# Patient Record
Sex: Female | Born: 1950 | Race: White | Hispanic: No | Marital: Married | Smoking: Never smoker
Health system: Southern US, Community
[De-identification: ages and names within clinical notes are randomized; demographics above are authoritative.]

## PROBLEM LIST (undated history)

## (undated) DIAGNOSIS — H409 Unspecified glaucoma: Secondary | ICD-10-CM

## (undated) DIAGNOSIS — K529 Noninfective gastroenteritis and colitis, unspecified: Secondary | ICD-10-CM

## (undated) DIAGNOSIS — M5416 Radiculopathy, lumbar region: Secondary | ICD-10-CM

## (undated) DIAGNOSIS — Z96659 Presence of unspecified artificial knee joint: Secondary | ICD-10-CM

## (undated) DIAGNOSIS — R112 Nausea with vomiting, unspecified: Secondary | ICD-10-CM

## (undated) DIAGNOSIS — G8929 Other chronic pain: Secondary | ICD-10-CM

## (undated) DIAGNOSIS — N83202 Unspecified ovarian cyst, left side: Secondary | ICD-10-CM

## (undated) DIAGNOSIS — Z8719 Personal history of other diseases of the digestive system: Secondary | ICD-10-CM

## (undated) DIAGNOSIS — T7840XA Allergy, unspecified, initial encounter: Secondary | ICD-10-CM

## (undated) DIAGNOSIS — M1990 Unspecified osteoarthritis, unspecified site: Secondary | ICD-10-CM

## (undated) DIAGNOSIS — M25551 Pain in right hip: Secondary | ICD-10-CM

## (undated) DIAGNOSIS — M4306 Spondylolysis, lumbar region: Secondary | ICD-10-CM

## (undated) DIAGNOSIS — R739 Hyperglycemia, unspecified: Secondary | ICD-10-CM

## (undated) DIAGNOSIS — E785 Hyperlipidemia, unspecified: Secondary | ICD-10-CM

## (undated) DIAGNOSIS — I2510 Atherosclerotic heart disease of native coronary artery without angina pectoris: Secondary | ICD-10-CM

## (undated) DIAGNOSIS — M810 Age-related osteoporosis without current pathological fracture: Secondary | ICD-10-CM

## (undated) DIAGNOSIS — M25512 Pain in left shoulder: Secondary | ICD-10-CM

## (undated) DIAGNOSIS — M545 Low back pain, unspecified: Secondary | ICD-10-CM

## (undated) DIAGNOSIS — Z8489 Family history of other specified conditions: Secondary | ICD-10-CM

## (undated) DIAGNOSIS — H40009 Preglaucoma, unspecified, unspecified eye: Secondary | ICD-10-CM

## (undated) DIAGNOSIS — M51369 Other intervertebral disc degeneration, lumbar region without mention of lumbar back pain or lower extremity pain: Secondary | ICD-10-CM

## (undated) DIAGNOSIS — I10 Essential (primary) hypertension: Secondary | ICD-10-CM

## (undated) DIAGNOSIS — M5136 Other intervertebral disc degeneration, lumbar region: Secondary | ICD-10-CM

## (undated) DIAGNOSIS — E669 Obesity, unspecified: Secondary | ICD-10-CM

## (undated) DIAGNOSIS — Z9889 Other specified postprocedural states: Secondary | ICD-10-CM

## (undated) HISTORY — DX: Pain in right hip: M25.551

## (undated) HISTORY — PX: NASAL SINUS SURGERY: SHX719

## (undated) HISTORY — DX: Other chronic pain: G89.29

## (undated) HISTORY — DX: Low back pain, unspecified: M54.50

## (undated) HISTORY — PX: OTHER SURGICAL HISTORY: SHX169

## (undated) HISTORY — DX: Allergy, unspecified, initial encounter: T78.40XA

## (undated) HISTORY — DX: Obesity, unspecified: E66.9

## (undated) HISTORY — DX: Hyperglycemia, unspecified: R73.9

## (undated) HISTORY — PX: CARPAL TUNNEL RELEASE: SHX101

## (undated) HISTORY — DX: Spondylolysis, lumbar region: M43.06

## (undated) HISTORY — DX: Atherosclerotic heart disease of native coronary artery without angina pectoris: I25.10

## (undated) HISTORY — DX: Age-related osteoporosis without current pathological fracture: M81.0

## (undated) HISTORY — DX: Other intervertebral disc degeneration, lumbar region without mention of lumbar back pain or lower extremity pain: M51.369

## (undated) HISTORY — DX: Hyperlipidemia, unspecified: E78.5

## (undated) HISTORY — DX: Presence of unspecified artificial knee joint: Z96.659

## (undated) HISTORY — DX: Radiculopathy, lumbar region: M54.16

## (undated) HISTORY — DX: Noninfective gastroenteritis and colitis, unspecified: K52.9

## (undated) HISTORY — DX: Pain in left shoulder: M25.512

## (undated) HISTORY — DX: Other intervertebral disc degeneration, lumbar region: M51.36

---

## 2000-04-09 ENCOUNTER — Encounter: Payer: Self-pay | Admitting: Family Medicine

## 2000-04-09 ENCOUNTER — Encounter: Admission: RE | Admit: 2000-04-09 | Discharge: 2000-04-09 | Payer: Self-pay | Admitting: Family Medicine

## 2001-04-21 ENCOUNTER — Encounter: Admission: RE | Admit: 2001-04-21 | Discharge: 2001-04-21 | Payer: Self-pay | Admitting: Family Medicine

## 2001-04-21 ENCOUNTER — Encounter: Payer: Self-pay | Admitting: Family Medicine

## 2002-05-04 ENCOUNTER — Encounter: Admission: RE | Admit: 2002-05-04 | Discharge: 2002-05-04 | Payer: Self-pay | Admitting: Family Medicine

## 2002-05-04 ENCOUNTER — Encounter: Payer: Self-pay | Admitting: Family Medicine

## 2003-05-22 ENCOUNTER — Encounter: Admission: RE | Admit: 2003-05-22 | Discharge: 2003-05-22 | Payer: Self-pay | Admitting: Family Medicine

## 2003-05-22 ENCOUNTER — Encounter: Payer: Self-pay | Admitting: Family Medicine

## 2004-05-27 ENCOUNTER — Encounter: Admission: RE | Admit: 2004-05-27 | Discharge: 2004-05-27 | Payer: Self-pay | Admitting: Family Medicine

## 2004-06-18 ENCOUNTER — Encounter: Admission: RE | Admit: 2004-06-18 | Discharge: 2004-06-18 | Payer: Self-pay | Admitting: Family Medicine

## 2005-06-19 ENCOUNTER — Encounter: Admission: RE | Admit: 2005-06-19 | Discharge: 2005-06-19 | Payer: Self-pay | Admitting: Family Medicine

## 2006-08-05 ENCOUNTER — Encounter: Admission: RE | Admit: 2006-08-05 | Discharge: 2006-08-05 | Payer: Self-pay | Admitting: Family Medicine

## 2007-08-08 ENCOUNTER — Encounter: Admission: RE | Admit: 2007-08-08 | Discharge: 2007-08-08 | Payer: Self-pay | Admitting: Family Medicine

## 2007-11-18 ENCOUNTER — Encounter: Admission: RE | Admit: 2007-11-18 | Discharge: 2007-11-18 | Payer: Self-pay | Admitting: Family Medicine

## 2008-08-09 ENCOUNTER — Encounter: Admission: RE | Admit: 2008-08-09 | Discharge: 2008-08-09 | Payer: Self-pay | Admitting: Family Medicine

## 2010-11-15 ENCOUNTER — Encounter: Payer: Self-pay | Admitting: Family Medicine

## 2011-05-30 ENCOUNTER — Encounter: Payer: Self-pay | Admitting: Internal Medicine

## 2011-08-27 ENCOUNTER — Encounter: Payer: Self-pay | Admitting: Internal Medicine

## 2011-08-28 ENCOUNTER — Other Ambulatory Visit: Payer: Self-pay | Admitting: Internal Medicine

## 2011-09-01 ENCOUNTER — Other Ambulatory Visit: Payer: Self-pay | Admitting: Internal Medicine

## 2011-10-02 ENCOUNTER — Ambulatory Visit (AMBULATORY_SURGERY_CENTER): Payer: BC Managed Care – PPO

## 2011-10-02 ENCOUNTER — Encounter: Payer: Self-pay | Admitting: Internal Medicine

## 2011-10-02 VITALS — Ht 66.0 in | Wt 188.9 lb

## 2011-10-02 DIAGNOSIS — Z1211 Encounter for screening for malignant neoplasm of colon: Secondary | ICD-10-CM

## 2011-10-02 MED ORDER — PEG-KCL-NACL-NASULF-NA ASC-C 100 G PO SOLR: 1.0000 | Freq: Once | ORAL | Status: AC

## 2011-10-16 ENCOUNTER — Encounter: Payer: Self-pay | Admitting: Internal Medicine

## 2011-10-16 ENCOUNTER — Ambulatory Visit (AMBULATORY_SURGERY_CENTER): Payer: BC Managed Care – PPO | Admitting: Internal Medicine

## 2011-10-16 VITALS — BP 129/61 | HR 85 | Temp 97.9°F | Resp 22 | Ht 66.0 in | Wt 188.0 lb

## 2011-10-16 DIAGNOSIS — D126 Benign neoplasm of colon, unspecified: Secondary | ICD-10-CM

## 2011-10-16 DIAGNOSIS — Z1211 Encounter for screening for malignant neoplasm of colon: Secondary | ICD-10-CM

## 2011-10-16 MED ORDER — SODIUM CHLORIDE 0.9 % IV SOLN: 500.0000 mL | INTRAVENOUS | Status: DC

## 2011-10-16 NOTE — Progress Notes (Signed)
Patient did not experience any of the following events: a burn prior to discharge; a fall within the facility; wrong site/side/patient/procedure/implant event; or a hospital transfer or hospital admission upon discharge from the facility. (G8907) Patient did not have preoperative order for IV antibiotic SSI prophylaxis. (G8918)  

## 2011-10-16 NOTE — Op Note (Signed)
[facility] [address] [place], [place]  [address]  COLONOSCOPY PROCEDURE REPORT  PATIENT:  Carol Cox, Carol Cox  MR#:  [id number] BIRTHDATE:  [birth year], 60 yrs. old  GENDER:  female ENDOSCOPIST:  Wilhemina Bonito. Eda Keys, MD REF. BY:  Screening/ Recall PROCEDURE DATE:  10/16/2011 PROCEDURE:  Colonoscopy with snare polypectomy x 4 ASA CLASS:  Class I INDICATIONS:  Routine Risk Screening ; index exam 04-2004 negative  MEDICATIONS:   Fentanyl 125 mcg IV, Versed 15 mg IV, Benadryl 25 mg IV, These medications were titrated to patient response per physician's verbal order  DESCRIPTION OF PROCEDURE:   After the risks benefits and alternatives of the procedure were thoroughly explained, informed consent was obtained.  Digital rectal exam was performed and revealed no abnormalities.   The LB 180AL [id number] endoscope was introduced through the anus and advanced to the cecum, which was identified by both the appendix and ileocecal valve, without limitations.  The quality of the prep was good, using MoviPrep. The instrument was then slowly withdrawn as the colon was fully examined. <<PROCEDUREIMAGES>>  FINDINGS: Four polyps, all < 5mm, were found in the ascending colon andere snared without cautery. Retrieval was successful. Otherwise normal colonoscopy without other polyps, masses, vascular ectasias, or inflammatory changes.   Retroflexed views in the rectum revealed no abnormalities.The time to cecum = 8:28 minutes. The scope was then withdrawn in 21:17 minutes from the cecum and the procedure completed.  COMPLICATIONS:  None  ENDOSCOPIC IMPRESSION: 1) Four polyps in the ascending colon - removed 2) Otherwise normal colonoscopy  RECOMMENDATIONS: 1) Repeat colonoscopy in 5 years if polyp adenomatous; otherwise 10 years  ______________________________ Wilhemina Bonito. Eda Keys, MD  CC:  Benedetto Goad, MD;  The Patient  n. eSIGNED:   Wilhemina Bonito. Eda Keys at 10/16/2011 09:38 AM  [place], [phone]

## 2011-10-16 NOTE — Progress Notes (Signed)
Pressure applied to abdomen to reach cecum.  

## 2011-10-16 NOTE — Patient Instructions (Signed)
Discharge instructions per blue and green sheets  Handout given on polyps  We will mail you a letter in 1-2 weeks with the pathology results and dr perry's recommendations

## 2011-10-19 ENCOUNTER — Telehealth: Payer: Self-pay | Admitting: *Deleted

## 2011-10-19 NOTE — Telephone Encounter (Signed)

## 2013-02-03 ENCOUNTER — Emergency Department (HOSPITAL_COMMUNITY): Payer: BC Managed Care – PPO

## 2013-02-03 ENCOUNTER — Other Ambulatory Visit: Payer: Self-pay

## 2013-02-03 ENCOUNTER — Encounter (HOSPITAL_COMMUNITY): Payer: Self-pay | Admitting: *Deleted

## 2013-02-03 ENCOUNTER — Inpatient Hospital Stay (HOSPITAL_COMMUNITY)
Admission: EM | Admit: 2013-02-03 | Discharge: 2013-02-06 | DRG: 183 | Disposition: A | Discharge status: Home / Self Care | Payer: BC Managed Care – PPO | Attending: Cardiology | Admitting: Cardiology

## 2013-02-03 DIAGNOSIS — I2510 Atherosclerotic heart disease of native coronary artery without angina pectoris: Secondary | ICD-10-CM | POA: Diagnosis present

## 2013-02-03 DIAGNOSIS — R0789 Other chest pain: Secondary | ICD-10-CM | POA: Diagnosis present

## 2013-02-03 DIAGNOSIS — Z79899 Other long term (current) drug therapy: Secondary | ICD-10-CM

## 2013-02-03 DIAGNOSIS — E785 Hyperlipidemia, unspecified: Secondary | ICD-10-CM | POA: Diagnosis present

## 2013-02-03 DIAGNOSIS — R079 Chest pain, unspecified: Secondary | ICD-10-CM

## 2013-02-03 DIAGNOSIS — Z8249 Family history of ischemic heart disease and other diseases of the circulatory system: Secondary | ICD-10-CM

## 2013-02-03 DIAGNOSIS — K219 Gastro-esophageal reflux disease without esophagitis: Principal | ICD-10-CM | POA: Diagnosis present

## 2013-02-03 DIAGNOSIS — Z7982 Long term (current) use of aspirin: Secondary | ICD-10-CM

## 2013-02-03 DIAGNOSIS — Z6830 Body mass index (BMI) 30.0-30.9, adult: Secondary | ICD-10-CM

## 2013-02-03 DIAGNOSIS — I200 Unstable angina: Secondary | ICD-10-CM

## 2013-02-03 DIAGNOSIS — E669 Obesity, unspecified: Secondary | ICD-10-CM | POA: Diagnosis present

## 2013-02-03 HISTORY — DX: Atherosclerotic heart disease of native coronary artery without angina pectoris: I25.10

## 2013-02-03 HISTORY — DX: Unspecified osteoarthritis, unspecified site: M19.90

## 2013-02-03 LAB — COMPREHENSIVE METABOLIC PANEL
ALT: 23 U/L (ref 0–35)
Albumin: 3.4 g/dL — ABNORMAL LOW (ref 3.5–5.2)
Alkaline Phosphatase: 58 U/L (ref 39–117)
BUN: 25 mg/dL — ABNORMAL HIGH (ref 6–23)
Calcium: 8.7 mg/dL (ref 8.4–10.5)
GFR calc Af Amer: 80 mL/min — ABNORMAL LOW (ref >90)
Potassium: 3.8 mEq/L (ref 3.5–5.1)
Total Protein: 6.2 g/dL (ref 6.0–8.3)

## 2013-02-03 LAB — CBC WITH DIFFERENTIAL/PLATELET
Basophils Relative: 0 % (ref 0–1)
Eosinophils Absolute: 0.1 10*3/uL (ref 0.0–0.7)
Eosinophils Relative: 1 % (ref 0–5)
Lymphs Abs: 1 10*3/uL (ref 0.7–4.0)
Monocytes Absolute: 0.6 10*3/uL (ref 0.1–1.0)
Monocytes Relative: 7 % (ref 3–12)
Neutro Abs: 7.2 10*3/uL (ref 1.7–7.7)
Neutrophils Relative %: 81 % — ABNORMAL HIGH (ref 43–77)
Platelets: 196 10*3/uL (ref 150–400)
RBC: 4.44 MIL/uL (ref 3.87–5.11)
WBC: 8.9 10*3/uL (ref 4.0–10.5)

## 2013-02-03 LAB — PROTIME-INR: INR: 0.95 (ref 0.00–1.49)

## 2013-02-03 MED ORDER — HEPARIN BOLUS VIA INFUSION
4000.0000 [IU] | Freq: Once | INTRAVENOUS | Status: AC
  Administered 2013-02-03: 4000 [IU] via INTRAVENOUS

## 2013-02-03 MED ORDER — SODIUM CHLORIDE 0.9 % IV BOLUS (SEPSIS)
1000.0000 mL | Freq: Once | INTRAVENOUS | Status: AC
  Administered 2013-02-03: 1000 mL via INTRAVENOUS

## 2013-02-03 MED ORDER — HEPARIN (PORCINE) IN NACL 100-0.45 UNIT/ML-% IJ SOLN
1200.0000 [IU]/h | INTRAMUSCULAR | Status: DC
  Administered 2013-02-03: 1300 [IU]/h via INTRAVENOUS
  Filled 2013-02-03 (×4): qty 250

## 2013-02-03 NOTE — H&P (Signed)
CARDIOLOGY ADMISSION NOTE  Patient ID: Carol Cox MRN: [id number] DOB/AGE: 01-02-1951 62 y.o.  Admit date: 02/03/2013 Primary Physician   Pamelia Hoit, MD Primary Cardiologist   None Chief Complaint    Chest pain  HPI:  The patient presents for evaluation of chest pain.  She has no history of CAD.  However, she has had long standing dyslipidemia and a very dramatic family history of CAD.  She was in her usual state of health.  She does have some symptoms of reflux treated with Zantac.  This am she had some discomfort that did not go away with Zantac.  At noon she had a severe episode while doing some very light housekeeping.  This was somewhat different.  There was pressure.  She became very diaphoretic and weak.  She was very nauseated.  She had dyspnea.  Her discomfort was 4/10.  This lasted for several minutes until her husband came home.  He called EMS.  EKG was nonacute.  She did get ASA. Symptoms improved after SL NTG.  Initial troponin in the ER was negative.  She otherwise has had no recent symptoms.      Past Medical History  Diagnosis Date  . Hyperlipidemia   . Allergy     Past Surgical History  Procedure Laterality Date  . Carpal tunnel release      Bilateral    No Known Allergies No current facility-administered medications on file prior to encounter.   Current Outpatient Prescriptions on File Prior to Encounter  Medication Sig Dispense Refill  . aspirin 81 MG tablet Take 81 mg by mouth daily.        . Calcium Carbonate-Vitamin D (CALTRATE 600+D) 600-400 MG-UNIT per tablet Take 1 tablet by mouth daily.        . cetirizine (ZYRTEC) 10 MG tablet Take 10 mg by mouth as needed for allergies.       Marland Kitchen ezetimibe-simvastatin (VYTORIN) 10-40 MG per tablet Take 1 tablet by mouth 4 (four) times a week.       . fish oil-omega-3 fatty acids 1000 MG capsule Take 1 g by mouth daily.        Marland Kitchen METROGEL 1 % gel Apply 1 application topically daily.       . Multiple Vitamin  (MULTIVITAMIN) tablet Take 1 tablet by mouth daily.         History   Social History  . Marital Status: Married    Spouse Name: N/A    Number of Children: 3  . Years of Education: N/A   Occupational History  . Retired     Runner, broadcasting/film/video   Social History Main Topics  . Smoking status: Never Smoker   . Smokeless tobacco: Never Used  . Alcohol Use: No  . Drug Use: No  . Sexually Active: Not on file   Other Topics Concern  . Not on file   Social History Narrative   Lives at home with husband. 4 grandchildren.      Family History  Problem Relation Age of Onset  . Sudden death Father 59    Presumed heart attack  . CAD Sister 6    Died age 19 of MI  . CAD Maternal Uncle 90    Fatal MI  . CAD Maternal Uncle 50    Fatal MI    ROS:  As stated in the HPI and negative for all other systems.  Physical Exam: Blood pressure 123/64, pulse 86, temperature 98.1 F (36.7 C), temperature  source Oral, resp. rate 19, height 5\' 6"  (1.676 m), weight 190 lb (86.183 kg), SpO2 97.00%.  GENERAL:  Well appearing HEENT:  Pupils equal round and reactive, fundi not visualized, oral mucosa unremarkable NECK:  No jugular venous distention, waveform within normal limits, carotid upstroke brisk and symmetric, no bruits, no thyromegaly LYMPHATICS:  No cervical, inguinal adenopathy LUNGS:  Clear to auscultation bilaterally BACK:  No CVA tenderness CHEST:  Unremarkable HEART:  PMI not displaced or sustained,S1 and S2 within normal limits, no S3, no S4, no clicks, no rubs, no murmurs ABD:  Flat, positive bowel sounds normal in frequency in pitch, no bruits, no rebound, no guarding, no midline pulsatile mass, no hepatomegaly, no splenomegaly EXT:  2 plus pulses throughout, no edema, no cyanosis no clubbing SKIN:  No rashes no nodules NEURO:  Cranial nerves II through XII grossly intact, motor grossly intact throughout PSYCH:  Cognitively intact, oriented to person place and time  Labs: Lab Results    Component Value Date   BUN 25* 02/03/2013   Lab Results  Component Value Date   CREATININE 0.89 02/03/2013   Lab Results  Component Value Date   NA 141 02/03/2013   K 3.8 02/03/2013   CL 109 02/03/2013   CO2 22 02/03/2013   No results found for this basename: CKTOTAL,  CKMB,  CKMBINDEX,  TROPONINI   Lab Results  Component Value Date   WBC 8.9 02/03/2013   HGB 13.6 02/03/2013   HCT 40.1 02/03/2013   MCV 90.3 02/03/2013   PLT 196 02/03/2013   No results found for this basename: CHOL,  HDL,  LDLCALC,  LDLDIRECT,  TRIG,  CHOLHDL   Lab Results  Component Value Date   ALT 23 02/03/2013   AST 24 02/03/2013   ALKPHOS 58 02/03/2013   BILITOT 0.4 02/03/2013     Radiology:  CXR:  Low lung volumes with mild bilateral infrahilar air space disease.  EKG:  Sinus rhythm, rate 86, axis within normal limits, intervals within normal limits, no acute ST-T wave changes.  Low voltage chest an limb leads.  Poor anterior R wave progression.   ASSESSMENT AND PLAN:    CHEST PAIN:  She has symptoms consistent with obstructive CAD and a very remarkable family history with I suspect familial dyslipidemia.  The pretest probability of obstructive CAD is high.  She will need a cardiac cath.  The patient understands that risks included but are not limited to stroke (1 in 1000), death (1 in 1000), kidney failure [usually temporary] (1 in 500), bleeding (1 in 200), allergic reaction [possibly serious] (1 in 200).  The patient understands and agrees to proceed.   HYPERLIPIDEMIA:   She has been intolerant of multiple medications.  She is tolerating the current medication four times per week.  I will check a lipid profile but our options for treatment are limited.    SignedRollene Rotunda 02/03/2013, 6:08 PM

## 2013-02-03 NOTE — ED Notes (Signed)
Attempted to call report. RN to call back. 

## 2013-02-03 NOTE — Progress Notes (Signed)
ANTICOAGULATION CONSULT NOTE - Initial Consult  Pharmacy Consult for UFH Indication: USAP  No Known Allergies  Patient Measurements: Height: 5\' 6"  (167.6 cm) Weight: 190 lb (86.183 kg) IBW/kg (Calculated) : 59.3 Heparin Dosing Weight: 78kg  Vital Signs: Temp: 98.1 F (36.7 C) (04/11 1423) Temp src: Oral (04/11 1423) BP: 123/64 mmHg (04/11 1730) Pulse Rate: 86 (04/11 1730)  Labs:  Recent Labs  02/03/13 1500  HGB 13.6  HCT 40.1  PLT 196  LABPROT 12.6  INR 0.95  CREATININE 0.89    Estimated Creatinine Clearance: 74.4 ml/min (by C-G formula based on Cr of 0.89).   Medical History: Past Medical History  Diagnosis Date  . Hyperlipidemia   . Allergy     Medications:   (Not in a hospital admission)  Assessment: 62 y/o female patient admitted with chest pain requiring anticoagulation for r/o MI. EKG wnl and CE neg x1. Plan for cardiac cath.  Goal of Therapy:  Heparin level 0.3-0.7 units/ml Monitor platelets by anticoagulation protocol: Yes   Plan:  Heparin 4000 unit IV bolus followed by infusion at 1300 units/hr. Check 6 hour heparin level with daily cbc and heparin level.  Verlene Mayer, PharmD, BCPS Pager [phone] 02/03/2013,6:51 PM

## 2013-02-03 NOTE — ED Provider Notes (Addendum)
History     CSN: [id number]  Arrival date & time 02/03/13  1420   First MD Initiated Contact with Patient 02/03/13 1420      Chief Complaint  Patient presents with  . Chest Pain    (Consider location/radiation/quality/duration/timing/severity/associated sxs/prior treatment) HPI The patient presents after 2 episodes of chest pressure, dyspnea, dizziness. Each episode was at rest.  The first episode had a mild prodromal period, but then the patient became short of breath with anterior chest tightness.  Each episode improved with nitroglycerin.  The patient also received aspirin after the first episode. During either episode was or syncope, unilateral weakness, falling, confusion or disorientation. The patient states that she was in her usual state of health prior to these episodes. She has a history of hyperlipidemia. She is a notable history of multiple members with early cardiac death. She denies smoking.  Past Medical History  Diagnosis Date  . Hyperlipidemia   . Allergy     Past Surgical History  Procedure Laterality Date  . Carpal tunnel release      Bilateral  . Colonoscopy      Family History  Problem Relation Age of Onset  . Heart disease Father   . Heart disease Sister     History  Substance Use Topics  . Smoking status: Never Smoker   . Smokeless tobacco: Never Used  . Alcohol Use: No    OB History   Grav Para Term Preterm Abortions TAB SAB Ect Mult Living                  Review of Systems  Constitutional:       Per HPI, otherwise negative  HENT:       Per HPI, otherwise negative  Respiratory:       Per HPI, otherwise negative  Cardiovascular:       Per HPI, otherwise negative  Gastrointestinal: Negative for vomiting.  Endocrine:       Negative aside from HPI  Genitourinary:       Neg aside from HPI   Musculoskeletal:       Per HPI, otherwise negative  Skin: Negative.   Neurological: Negative for syncope.    Allergies  Review of  patient's allergies indicates no known allergies.  Home Medications   Current Outpatient Rx  Name  Route  Sig  Dispense  Refill  . aspirin 81 MG tablet   Oral   Take 81 mg by mouth daily.           . Calcium Carbonate-Vitamin D (CALTRATE 600+D) 600-400 MG-UNIT per tablet   Oral   Take 1 tablet by mouth daily.           . cetirizine (ZYRTEC) 10 MG tablet   Oral   Take 10 mg by mouth as needed for allergies.          Marland Kitchen ezetimibe-simvastatin (VYTORIN) 10-40 MG per tablet   Oral   Take 1 tablet by mouth 4 (four) times a week.          . fish oil-omega-3 fatty acids 1000 MG capsule   Oral   Take 1 g by mouth daily.           Marland Kitchen METROGEL 1 % gel   Topical   Apply 1 application topically daily.          . Multiple Vitamin (MULTIVITAMIN) tablet   Oral   Take 1 tablet by mouth daily.  BP 115/64  Pulse 80  Temp(Src) 98.1 F (36.7 C) (Oral)  Resp 14  Ht 5\' 6"  (1.676 m)  Wt 190 lb (86.183 kg)  BMI 30.68 kg/m2  SpO2 100%  Physical Exam  Nursing note and vitals reviewed. Constitutional: She is oriented to person, place, and time. She appears well-developed and well-nourished. No distress.  HENT:  Head: Normocephalic and atraumatic.  Eyes: Conjunctivae and EOM are normal.  Cardiovascular: Normal rate and regular rhythm.   Pulmonary/Chest: Effort normal and breath sounds normal. No stridor. No respiratory distress.  Abdominal: She exhibits no distension.  Musculoskeletal: She exhibits no edema.  Neurological: She is alert and oriented to person, place, and time. No cranial nerve deficit.  Skin: Skin is warm. She is diaphoretic.  Psychiatric: She has a normal mood and affect.   On patient's arrival I discussed her case with EMS providers, who corroborates the patient's recollection of multiple episodes of chest pain, with resolution following nitroglycerin provision.  We reviewed the EMS ECG, which had abnormalities in some inferior leads. ED Course   Procedures (including critical care time)  Labs Reviewed  CBC WITH DIFFERENTIAL - Abnormal; Notable for the following:    Neutrophils Relative 81 (*)    Lymphocytes Relative 11 (*)    All other components within normal limits  COMPREHENSIVE METABOLIC PANEL  PROTIME-INR   No results found.   No diagnosis found.  Pulse ox 99% room air normal Cardiac 85 sinus rhythm normal   Date: 02/03/2013  Rate: 86  Rhythm: normal sinus rhythm  QRS Axis: normal  Intervals: normal  ST/T Wave abnormalities: normal  Conduction Disutrbances:inferior qrs changes- nonspecific  Narrative Interpretation:   Old EKG Reviewed: none available ABNORMAL  3:40 PM On repeat exam the patient was calm  Findings thus far are discussed with the patient and her family members.   4:03 PM Interpreted the chest x-ray. The interpretation of bilateral infrahilar opacification requires additional evaluation.  However, patient is not hypoxic, tachypneic, has minimal risk factors for respiratory disease, and no known diagnoses.  MDM  This patient with no prior cardiac evaluation, but with a strong family history of early cardiac death presents after 2 episodes of chest pain at rest, and each responded to nitroglycerin tablets.  On exam she isn't hypoxic, tachycardic but she appears uncomfortable initially.  Her description of new angina like symptoms, the absence of prior evaluation, she was admitted for further evaluation and management.        Gerhard Munch, MD 02/03/13 1545  Gerhard Munch, MD 02/03/13 [phone]

## 2013-02-03 NOTE — ED Notes (Signed)
Cardiologist at bedside.  

## 2013-02-03 NOTE — ED Notes (Signed)
C/o midsternal CP, SOB, nausea, dizziness, diaphoresis while at rest. Given ASA 324mg , NTG x1 with complete relief. Enroute to ED had another similar episode, given another NTG with relief. Presently denies all symptoms, does c/o generalized weakness.

## 2013-02-03 NOTE — ED Notes (Signed)
[name], NS ordered heart healthy diet for pt.

## 2013-02-03 NOTE — ED Notes (Signed)
Reports shortly upon awakening this morning c/o heartburn. Took zantac & 4 tums without relief. States heartburn then turned into chest tightness which was relieved with NTG. Presently denies heartburn or CP. CP described as a tightness & non radiating. Skin warm & dry. Denies recent fever, cold, cough, n/v/d.

## 2013-02-03 NOTE — ED Notes (Signed)
Patient transported to X-ray 

## 2013-02-04 ENCOUNTER — Encounter (HOSPITAL_COMMUNITY): Payer: Self-pay | Admitting: General Practice

## 2013-02-04 DIAGNOSIS — R079 Chest pain, unspecified: Secondary | ICD-10-CM

## 2013-02-04 LAB — HEPARIN LEVEL (UNFRACTIONATED)
Heparin Unfractionated: 0.71 IU/mL — ABNORMAL HIGH (ref 0.30–0.70)
Heparin Unfractionated: 0.54 IU/mL (ref 0.30–0.70)
Heparin Unfractionated: 0.5 IU/mL (ref 0.30–0.70)

## 2013-02-04 LAB — CBC WITH DIFFERENTIAL/PLATELET
Basophils Absolute: 0 10*3/uL (ref 0.0–0.1)
Basophils Relative: 0 % (ref 0–1)
Eosinophils Absolute: 0.1 10*3/uL (ref 0.0–0.7)
Eosinophils Relative: 2 % (ref 0–5)
HCT: 37.3 % (ref 36.0–46.0)
Hemoglobin: 12.7 g/dL (ref 12.0–15.0)
MCH: 30.3 pg (ref 26.0–34.0)
MCHC: 34 g/dL (ref 30.0–36.0)
Monocytes Absolute: 0.5 10*3/uL (ref 0.1–1.0)
Monocytes Relative: 8 % (ref 3–12)
Neutro Abs: 3.8 10*3/uL (ref 1.7–7.7)
RDW: 13.8 % (ref 11.5–15.5)

## 2013-02-04 LAB — COMPREHENSIVE METABOLIC PANEL
Albumin: 3.1 g/dL — ABNORMAL LOW (ref 3.5–5.2)
Alkaline Phosphatase: 54 U/L (ref 39–117)
BUN: 20 mg/dL (ref 6–23)
Calcium: 8.6 mg/dL (ref 8.4–10.5)
Creatinine, Ser: 0.91 mg/dL (ref 0.50–1.10)
GFR calc Af Amer: 78 mL/min — ABNORMAL LOW (ref >90)
Glucose, Bld: 110 mg/dL — ABNORMAL HIGH (ref 70–99)
Total Protein: 5.9 g/dL — ABNORMAL LOW (ref 6.0–8.3)

## 2013-02-04 LAB — PRO B NATRIURETIC PEPTIDE: Pro B Natriuretic peptide (BNP): 46.6 pg/mL (ref 0–125)

## 2013-02-04 LAB — TROPONIN I: Troponin I: <0.3 ng/mL (ref <0.30)

## 2013-02-04 LAB — HEMOGLOBIN A1C: Mean Plasma Glucose: 114 mg/dL (ref <117)

## 2013-02-04 LAB — LIPID PANEL
Cholesterol: 143 mg/dL (ref 0–200)
Total CHOL/HDL Ratio: 2.9 RATIO

## 2013-02-04 LAB — MAGNESIUM: Magnesium: 1.9 mg/dL (ref 1.5–2.5)

## 2013-02-04 MED ORDER — NITROGLYCERIN 0.4 MG SL SUBL: 0.4000 mg | SUBLINGUAL_TABLET | SUBLINGUAL | Status: DC | PRN

## 2013-02-04 MED ORDER — ACETAMINOPHEN 325 MG PO TABS: 650.0000 mg | ORAL_TABLET | ORAL | Status: DC | PRN

## 2013-02-04 MED ORDER — SODIUM CHLORIDE 0.9 % IV SOLN: 250.0000 mL | INTRAVENOUS | Status: DC | PRN

## 2013-02-04 MED ORDER — SODIUM CHLORIDE 0.9 % IJ SOLN
3.0000 mL | Freq: Two times a day (BID) | INTRAMUSCULAR | Status: DC
  Administered 2013-02-05: 3 mL via INTRAVENOUS

## 2013-02-04 MED ORDER — ONDANSETRON HCL 4 MG/2ML IJ SOLN: 4.0000 mg | Freq: Four times a day (QID) | INTRAMUSCULAR | Status: DC | PRN

## 2013-02-04 MED ORDER — ASPIRIN EC 81 MG PO TBEC
81.0000 mg | DELAYED_RELEASE_TABLET | Freq: Every day | ORAL | Status: DC
  Administered 2013-02-04 – 2013-02-05 (×2): 81 mg via ORAL
  Filled 2013-02-04 (×3): qty 1

## 2013-02-04 MED ORDER — SODIUM CHLORIDE 0.9 % IJ SOLN: 3.0000 mL | INTRAMUSCULAR | Status: DC | PRN

## 2013-02-04 MED ORDER — SODIUM CHLORIDE 0.9 % IV SOLN
1.0000 mL/kg/h | INTRAVENOUS | Status: DC
  Administered 2013-02-06: 1 mL/kg/h via INTRAVENOUS

## 2013-02-04 MED ORDER — SODIUM CHLORIDE 0.9 % IJ SOLN
3.0000 mL | Freq: Two times a day (BID) | INTRAMUSCULAR | Status: DC
  Administered 2013-02-04 – 2013-02-05 (×3): 3 mL via INTRAVENOUS

## 2013-02-04 MED ORDER — METOPROLOL TARTRATE 12.5 MG HALF TABLET
12.5000 mg | ORAL_TABLET | Freq: Two times a day (BID) | ORAL | Status: DC
  Administered 2013-02-04 – 2013-02-05 (×4): 12.5 mg via ORAL
  Filled 2013-02-04 (×7): qty 1

## 2013-02-04 MED ORDER — ALPRAZOLAM 0.5 MG PO TABS
0.5000 mg | ORAL_TABLET | Freq: Once | ORAL | Status: AC
  Administered 2013-02-04: 0.5 mg via ORAL
  Filled 2013-02-04: qty 1

## 2013-02-04 MED ORDER — EZETIMIBE-SIMVASTATIN 10-40 MG PO TABS
1.0000 | ORAL_TABLET | ORAL | Status: DC
  Administered 2013-02-04 – 2013-02-05 (×2): 1 via ORAL
  Filled 2013-02-04 (×3): qty 1

## 2013-02-04 NOTE — Progress Notes (Signed)
ANTICOAGULATION CONSULT NOTE - Follow Up Consult  Pharmacy Consult for heparin Indication: USAP  Labs:  Recent Labs  02/03/13 1500 02/04/13 0057  HGB 13.6 12.7  HCT 40.1 37.3  PLT 196 202  LABPROT 12.6  --   INR 0.95  --   HEPARINUNFRC  --  0.71*  CREATININE 0.89 0.91  TROPONINI  --  <0.30    Assessment: 61yo female slightly supratherapeutic on heparin with initial dosing for USAP.  Goal of Therapy:  Heparin level 0.3-0.7 units/ml   Plan:  Will decrease heparin gtt slightly to 1200 units/hr and check level with next CE.  Vernard Gambles, PharmD, BCPS  02/04/2013,2:24 AM

## 2013-02-04 NOTE — Progress Notes (Addendum)
ANTICOAGULATION CONSULT NOTE - Follow Up Consult  Pharmacy Consult for Heparin Indication: chest pain/ACS  No Known Allergies  Patient Measurements: Height: 5\' 6"  (167.6 cm) Weight: 190 lb (86.183 kg) IBW/kg (Calculated) : 59.3 Heparin Dosing Weight: 78 kg  Vital Signs: Temp: 98.1 F (36.7 C) (04/12 0500) Temp src: Oral (04/11 2328) BP: 111/71 mmHg (04/12 0500) Pulse Rate: 70 (04/12 0500)  Labs:  Recent Labs  02/03/13 1500 02/04/13 0057 02/04/13 0530  HGB 13.6 12.7  --   HCT 40.1 37.3  --   PLT 196 202  --   LABPROT 12.6  --   --   INR 0.95  --   --   HEPARINUNFRC  --  0.71* 0.54  CREATININE 0.89 0.91  --   TROPONINI  --  <0.30 <0.30    Estimated Creatinine Clearance: 72.8 ml/min (by C-G formula based on Cr of 0.91).   Medications:  Infusions:  . [START ON 02/06/2013] sodium chloride    . heparin 1,200 Units/hr (02/04/13 0230)    Assessment: 62 y/o female who presented to the ED with CP. Patient has a history of hyperlipidemia and family history of CAD. She is being managed on heparin until cath on Mon. Heparin level is therapeutic. No bleeding noted, CBC is normal.  Goal of Therapy:  Heparin level 0.3-0.7 units/ml Monitor platelets by anticoagulation protocol: Yes   Plan:  -Continue heparin drip at 1200 units/hr -Confirmatory heparin level at 12:00 -Daily heparin level and CBC while on heparin -Monitor for signs/symptoms of bleeding  [facility], [place].D., BCPS Clinical Pharmacist Pager: [phone] 02/04/2013 8:00 AM   Addendum: Confirmatory heparin level is 0.5 and therapeutic. Continue heparin drip at 1200 units/hr. F/U in am.  [facility], Pharm.D., BCPS Clinical Pharmacist Pager: [phone] 02/04/2013 1:22 PM

## 2013-02-04 NOTE — Progress Notes (Signed)
Subjective:  The patient is feeling well this morning.  No chest pain overnight.  Rhythm stable on telemetry.  Cardiac enzymes are negative for myocardial infarction.  She is on IV heparin awaiting cardiac catheterization on Monday.  Objective:  Vital Signs in the last 24 hours: Temp:  [98.1 F (36.7 C)-98.8 F (37.1 C)] 98.1 F (36.7 C) (04/12 0500) Pulse Rate:  [70-93] 70 (04/12 0500) Resp:  [14-24] 18 (04/12 0500) BP: (100-128)/(53-74) 111/71 mmHg (04/12 0500) SpO2:  [96 %-100 %] 96 % (04/12 0500) Weight:  [190 lb (86.183 kg)] 190 lb (86.183 kg) (04/11 1423)  Intake/Output from previous day: 04/11 0701 - 04/12 0700 In: 450 [I.V.:450] Out: -  Intake/Output from this shift:    . aspirin EC  81 mg Oral Daily  . ezetimibe-simvastatin  1 tablet Oral 4 times weekly  . metoprolol tartrate  12.5 mg Oral BID  . sodium chloride  3 mL Intravenous Q12H  . [START ON 02/05/2013] sodium chloride  3 mL Intravenous Q12H   . [START ON 02/06/2013] sodium chloride    . heparin 1,200 Units/hr (02/04/13 0230)    Physical Exam: The patient appears to be in no distress.  Head and neck exam reveals that the pupils are equal and reactive.  The extraocular movements are full.  There is no scleral icterus.  Mouth and pharynx are benign.  No lymphadenopathy.  No carotid bruits.  The jugular venous pressure is normal.  Thyroid is not enlarged or tender.  Chest is clear to percussion and auscultation.  No rales or rhonchi.  Expansion of the chest is symmetrical.  Heart reveals no abnormal lift or heave.  First and second heart sounds are normal.  There is no  gallop rub or click.  Soft systolic ejection murmur at base.  The abdomen is soft and nontender.  Bowel sounds are normoactive.  There is no hepatosplenomegaly or mass.  There are no abdominal bruits.  Extremities reveal no phlebitis or edema.  Pedal pulses are good.  There is no cyanosis or clubbing.  Neurologic exam is normal strength and  no lateralizing weakness.  No sensory deficits.  Integument reveals no rash  Lab Results:  Recent Labs  02/03/13 1500 02/04/13 0057  WBC 8.9 6.0  HGB 13.6 12.7  PLT 196 202    Recent Labs  02/03/13 1500 02/04/13 0057  NA 141 137  K 3.8 3.6  CL 109 107  CO2 22 23  GLUCOSE 104* 110*  BUN 25* 20  CREATININE 0.89 0.91    Recent Labs  02/04/13 0057 02/04/13 0530  TROPONINI <0.30 <0.30   Hepatic Function Panel  Recent Labs  02/04/13 0057  PROT 5.9*  ALBUMIN 3.1*  AST 21  ALT 20  ALKPHOS 54  BILITOT 0.3    Recent Labs  02/04/13 0530  CHOL 143   No results found for this basename: PROTIME,  in the last 72 hours  Imaging: Dg Chest 2 View  02/03/2013  *RADIOLOGY REPORT*  Clinical Data: Chest discomfort with shortness of breath and weakness.  CHEST - 2 VIEW  Comparison: 11/17/2007.  Findings: Trachea is midline.  Heart size normal, lungs are low in volume with bilateral infrahilar air space disease.  No pleural fluid.  IMPRESSION: Low lung volumes with mild bilateral infrahilar air space disease.   Original Report Authenticated By: Leanna Battles, M.D.     Cardiac Studies: EKG this morning shows no ischemic changes. Assessment/Plan:  CHEST PAIN: She has symptoms consistent with  obstructive CAD and a very remarkable family history with I suspect familial dyslipidemia. The pretest probability of obstructive CAD is high. HYPERLIPIDEMIA: She has been intolerant of multiple medications. She is tolerating the current medication four times per week.  Results of lipid panel are satisfactory with an LDL 78 and HDL 49  Plan: Continue IV heparin over weekend.  Left heart cardiac catheterization on Monday.   LOS: 1 day    Cassell Clement 02/04/2013, 7:47 AM

## 2013-02-05 LAB — CBC
HCT: 40.8 % (ref 36.0–46.0)
Hemoglobin: 13.9 g/dL (ref 12.0–15.0)
MCHC: 34.1 g/dL (ref 30.0–36.0)
RDW: 13.9 % (ref 11.5–15.5)
WBC: 6.2 10*3/uL (ref 4.0–10.5)

## 2013-02-05 LAB — HEPARIN LEVEL (UNFRACTIONATED): Heparin Unfractionated: 0.4 IU/mL (ref 0.30–0.70)

## 2013-02-05 NOTE — Progress Notes (Signed)
ANTICOAGULATION CONSULT NOTE - Follow Up Consult  Pharmacy Consult for Heparin Indication: chest pain/ACS  No Known Allergies  Patient Measurements: Height: 5\' 6"  (167.6 cm) Weight: 190 lb (86.183 kg) IBW/kg (Calculated) : 59.3 Heparin Dosing Weight: 78 kg  Vital Signs: Temp: 98 F (36.7 C) (04/13 0501) Temp src: Oral (04/13 0501) BP: 133/62 mmHg (04/13 0501) Pulse Rate: 54 (04/13 0501)  Labs:  Recent Labs  02/03/13 1500  02/04/13 0057 02/04/13 0530 02/04/13 1225 02/04/13 1226 02/05/13 0435 02/05/13 0744  HGB 13.6  --  12.7  --   --   --  13.9  --   HCT 40.1  --  37.3  --   --   --  40.8  --   PLT 196  --  202  --   --   --  205  --   LABPROT 12.6  --   --   --   --   --   --   --   INR 0.95  --   --   --   --   --   --   --   HEPARINUNFRC  --   < > 0.71* 0.54  --  0.50  --  0.40  CREATININE 0.89  --  0.91  --   --   --   --   --   TROPONINI  --   --  <0.30 <0.30 <0.30  --   --   --   < > = values in this interval not displayed.  Estimated Creatinine Clearance: 72.8 ml/min (by C-G formula based on Cr of 0.91).   Medications:  Infusions:  . [START ON 02/06/2013] sodium chloride    . heparin 1,200 Units/hr (02/04/13 0230)    Assessment: 62 y/o female who presented to the ED with CP. Patient has a history of hyperlipidemia and family history of CAD. She is being managed on heparin until cath tomorrow. Heparin level is therapeutic. No bleeding noted, CBC is normal.  Goal of Therapy:  Heparin level 0.3-0.7 units/ml Monitor platelets by anticoagulation protocol: Yes   Plan:  -Continue heparin drip at 1200 units/hr -Daily heparin level and CBC while on heparin -Monitor for signs/symptoms of bleeding -Will f/u after cath tomorrow  [facility], Pharm.D., BCPS Clinical Pharmacist Pager: [phone] 02/05/2013 11:24 AM

## 2013-02-05 NOTE — Progress Notes (Signed)
 Subjective:  The patient is feeling well this morning.  No chest pain overnight.  Rhythm stable on telemetry.  Cardiac enzymes are negative for myocardial infarction.  She is on IV heparin awaiting cardiac catheterization tomorrow. Granted request to take a shower.  Objective:  Vital Signs in the last 24 hours: Temp:  [98 F (36.7 C)-99.4 F (37.4 C)] 98 F (36.7 C) (04/13 0501) Pulse Rate:  [54-87] 54 (04/13 0501) Resp:  [20] 20 (04/13 0501) BP: (121-133)/(62-78) 133/62 mmHg (04/13 0501) SpO2:  [97 %-98 %] 98 % (04/13 0501)  Intake/Output from previous day: 04/12 0701 - 04/13 0700 In: 360 [P.O.:360] Out: -  Intake/Output from this shift:    . aspirin EC  81 mg Oral Daily  . ezetimibe-simvastatin  1 tablet Oral 4 times weekly  . metoprolol tartrate  12.5 mg Oral BID  . sodium chloride  3 mL Intravenous Q12H  . sodium chloride  3 mL Intravenous Q12H   . [START ON 02/06/2013] sodium chloride    . heparin 1,200 Units/hr (02/04/13 0230)    Physical Exam: The patient appears to be in no distress.  Head and neck exam reveals that the pupils are equal and reactive.  The extraocular movements are full.  There is no scleral icterus.  Mouth and pharynx are benign.  No lymphadenopathy.  No carotid bruits.  The jugular venous pressure is normal.  Thyroid is not enlarged or tender.  Chest is clear to percussion and auscultation.  No rales or rhonchi.  Expansion of the chest is symmetrical.  Heart reveals no abnormal lift or heave.  First and second heart sounds are normal.  There is no  gallop rub or click.  Soft systolic ejection murmur at base.  The abdomen is soft and nontender.  Bowel sounds are normoactive.  There is no hepatosplenomegaly or mass.  There are no abdominal bruits.  Extremities reveal no phlebitis or edema.  Pedal pulses are good.  There is no cyanosis or clubbing.  Neurologic exam is normal strength and no lateralizing weakness.  No sensory  deficits.  Integument reveals no rash  Lab Results:  Recent Labs  02/04/13 0057 02/05/13 0435  WBC 6.0 6.2  HGB 12.7 13.9  PLT 202 205    Recent Labs  02/03/13 1500 02/04/13 0057  NA 141 137  K 3.8 3.6  CL 109 107  CO2 22 23  GLUCOSE 104* 110*  BUN 25* 20  CREATININE 0.89 0.91    Recent Labs  02/04/13 0530 02/04/13 1225  TROPONINI <0.30 <0.30   Hepatic Function Panel  Recent Labs  02/04/13 0057  PROT 5.9*  ALBUMIN 3.1*  AST 21  ALT 20  ALKPHOS 54  BILITOT 0.3    Recent Labs  02/04/13 0530  CHOL 143   No results found for this basename: PROTIME,  in the last 72 hours  Imaging: Dg Chest 2 View  02/03/2013  *RADIOLOGY REPORT*  Clinical Data: Chest discomfort with shortness of breath and weakness.  CHEST - 2 VIEW  Comparison: 11/17/2007.  Findings: Trachea is midline.  Heart size normal, lungs are low in volume with bilateral infrahilar air space disease.  No pleural fluid.  IMPRESSION: Low lung volumes with mild bilateral infrahilar air space disease.   Original Report Authenticated By: Melinda Blietz, M.D.     Cardiac Studies: EKG this morning shows no ischemic changes. Assessment/Plan:  CHEST PAIN: She has symptoms consistent with obstructive CAD and a very remarkable family history with I   suspect familial dyslipidemia. The pretest probability of obstructive CAD is high. HYPERLIPIDEMIA: She has been intolerant of multiple medications. She is tolerating the current medication four times per week.  Results of lipid panel are satisfactory with an LDL 78 and HDL 49 HYPERGLYCEMIA mild.  A1C normal 5.6 Plan: Continue IV heparin over weekend.  Left heart cardiac catheterization in a.m. All questions answered.   LOS: 2 days    Demitrious Mccannon 02/05/2013, 8:04 AM    

## 2013-02-05 NOTE — Progress Notes (Signed)
Utilization review completed.  

## 2013-02-06 ENCOUNTER — Encounter (HOSPITAL_COMMUNITY)
Admission: EM | Disposition: A | Discharge status: Home / Self Care | Payer: Self-pay | Source: Home / Self Care | Attending: Cardiology

## 2013-02-06 ENCOUNTER — Encounter (HOSPITAL_COMMUNITY): Payer: Self-pay | Admitting: Physician Assistant

## 2013-02-06 DIAGNOSIS — I200 Unstable angina: Secondary | ICD-10-CM

## 2013-02-06 HISTORY — PX: LEFT HEART CATHETERIZATION WITH CORONARY ANGIOGRAM: SHX5451

## 2013-02-06 LAB — CBC
Hemoglobin: 14.5 g/dL (ref 12.0–15.0)
Platelets: 220 10*3/uL (ref 150–400)
RBC: 4.75 MIL/uL (ref 3.87–5.11)
WBC: 7.1 10*3/uL (ref 4.0–10.5)

## 2013-02-06 LAB — BASIC METABOLIC PANEL
CO2: 24 mEq/L (ref 19–32)
Calcium: 9.6 mg/dL (ref 8.4–10.5)
GFR calc non Af Amer: 64 mL/min — ABNORMAL LOW (ref >90)
Potassium: 3.7 mEq/L (ref 3.5–5.1)
Sodium: 141 mEq/L (ref 135–145)

## 2013-02-06 LAB — HEPARIN LEVEL (UNFRACTIONATED): Heparin Unfractionated: 0.3 IU/mL (ref 0.30–0.70)

## 2013-02-06 SURGERY — LEFT HEART CATHETERIZATION WITH CORONARY ANGIOGRAM
Anesthesia: LOCAL

## 2013-02-06 MED ORDER — MIDAZOLAM HCL 2 MG/2ML IJ SOLN
INTRAMUSCULAR | Status: AC
  Filled 2013-02-06: qty 2

## 2013-02-06 MED ORDER — HEPARIN (PORCINE) IN NACL 2-0.9 UNIT/ML-% IJ SOLN
INTRAMUSCULAR | Status: AC
  Filled 2013-02-06: qty 1500

## 2013-02-06 MED ORDER — FENTANYL CITRATE 0.05 MG/ML IJ SOLN
INTRAMUSCULAR | Status: AC
  Filled 2013-02-06: qty 2

## 2013-02-06 MED ORDER — RANITIDINE HCL 150 MG PO TABS: 150.0000 mg | ORAL_TABLET | Freq: Two times a day (BID) | ORAL | Status: DC | PRN

## 2013-02-06 MED ORDER — VERAPAMIL HCL 2.5 MG/ML IV SOLN
INTRAVENOUS | Status: AC
  Filled 2013-02-06: qty 2

## 2013-02-06 MED ORDER — SODIUM CHLORIDE 0.9 % IV SOLN: INTRAVENOUS | Status: AC

## 2013-02-06 MED ORDER — NITROGLYCERIN 0.4 MG SL SUBL: 0.4000 mg | SUBLINGUAL_TABLET | SUBLINGUAL | Status: DC | PRN

## 2013-02-06 MED ORDER — ASPIRIN 81 MG PO CHEW: 81.0000 mg | CHEWABLE_TABLET | Freq: Every day | ORAL | Status: DC

## 2013-02-06 MED ORDER — LIDOCAINE HCL (PF) 1 % IJ SOLN
INTRAMUSCULAR | Status: AC
  Filled 2013-02-06: qty 30

## 2013-02-06 MED ORDER — NITROGLYCERIN 1 MG/10 ML FOR IR/CATH LAB
INTRA_ARTERIAL | Status: AC
  Filled 2013-02-06: qty 10

## 2013-02-06 MED ORDER — ACETAMINOPHEN 325 MG PO TABS: 650.0000 mg | ORAL_TABLET | ORAL | Status: DC | PRN

## 2013-02-06 MED ORDER — HEPARIN SODIUM (PORCINE) 1000 UNIT/ML IJ SOLN
INTRAMUSCULAR | Status: AC
  Filled 2013-02-06: qty 1

## 2013-02-06 MED ORDER — ONDANSETRON HCL 4 MG/2ML IJ SOLN: 4.0000 mg | Freq: Four times a day (QID) | INTRAMUSCULAR | Status: DC | PRN

## 2013-02-06 NOTE — Interval H&P Note (Signed)
History and Physical Interval Note:  02/06/2013 7:53 AM  Carol Cox  has presented today for surgery, with the diagnosis of cp  The various methods of treatment have been discussed with the patient and family. After consideration of risks, benefits and other options for treatment, the patient has consented to  Procedure(s): LEFT HEART CATHETERIZATION WITH CORONARY ANGIOGRAM (N/A) as a surgical intervention .  The patient's history has been reviewed, patient examined, no change in status, stable for surgery.  I have reviewed the patient's chart and labs.  Questions were answered to the patient's satisfaction.     Dalton [employer]

## 2013-02-06 NOTE — Discharge Summary (Signed)
Discharge Summary   Patient ID: Carol Cox MRN: [id number], DOB/AGE: 04-07-1951 62 y.o. Admit date: 02/03/2013 D/C date:     02/06/2013  Primary Cardiologist: Shirlee Latch  Primary Discharge Diagnoses:  1. 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 Chest pain, noncardiac, ?related to GERD 3. Hyperlipidemia, intolerant to multiple medications in the past but tolerating Vytorin 4. Obesity BMI 30  Secondary Discharge Diagnoses:  1. Arthritis  Hospital Course: Carol Cox is a 62 y/o F with HLD and no prior history of CAD. She does have a dramatic family history of CAD. She presented to [facility] on 02/03/13 with complaints of chest pain. She has had some symptoms of reflux treated with Zantac but on the morning of admission, she had chest discomfort that did not go away with Zantac. Around noon she had a severe episode while doing some very light housekeeping that was somewhat different and accompanied by pressure. She became diaphoretic and weak with nausea and dyspnea. The discomfort was 4/10 and lasted for several minutes. Her husband called EMS. EKG was nonacute. Symptoms improved after SL NTG. Initial troponin was negative. Symptoms were felt concerning for unstable angina. She was started on heparin per pharmacy and observed over the weekend with plan for cardiac cath on Monday. Troponins remained negative. She underwent cath this morning demonstrating mild nonobstructive CAD (25% mid LAD), EF 65-70%. Her chest pain was suspected noncardiac, ?related to GERD. She was not tachycardic, tachypnic or hypoxic. Dr. Shirlee Latch has recommended to continue ASA 81mg  daily and statin. He has seen and examined the patient today and feels she is stable for discharge.  Discharge Vitals: Blood pressure 156/89, pulse 67, temperature 98.2 F (36.8 C), temperature source Oral, resp. rate 18, height 5\' 6"  (1.676 m), weight 190 lb (86.183 kg), SpO2 96.00%.  Labs: Lab Results  Component Value  Date   WBC 7.1 02/06/2013   HGB 14.5 02/06/2013   HCT 41.6 02/06/2013   MCV 87.6 02/06/2013   PLT 220 02/06/2013    Recent Labs Lab 02/04/13 0057 02/06/13 0630  NA 137 141  K 3.6 3.7  CL 107 107  CO2 23 24  BUN 20 18  CREATININE 0.91 0.95  CALCIUM 8.6 9.6  PROT 5.9*  --   BILITOT 0.3  --   ALKPHOS 54  --   ALT 20  --   AST 21  --   GLUCOSE 110* 93    Recent Labs  02/04/13 0057 02/04/13 0530 02/04/13 1225  TROPONINI <0.30 <0.30 <0.30   Lab Results  Component Value Date   CHOL 143 02/04/2013   HDL 49 02/04/2013   LDLCALC 78 02/04/2013   TRIG 78 02/04/2013     Diagnostic Studies/Procedures    1. Cardiac catheterization this admission, please see full report and above for summary. 2. Chest 2 View 02/03/2013  *RADIOLOGY REPORT*  Clinical Data: Chest discomfort with shortness of breath and weakness.  CHEST - 2 VIEW  Comparison: 11/17/2007.  Findings: Trachea is midline.  Heart size normal, lungs are low in volume with bilateral infrahilar air space disease.  No pleural fluid.  IMPRESSION: Low lung volumes with mild bilateral infrahilar air space disease.   Original Report Authenticated By: Leanna Battles, M.D.     Discharge Medications     Medication List    TAKE these medications       aspirin 81 MG tablet  Take 81 mg by mouth daily.     CALTRATE 600+D 600-400  MG-UNIT per tablet  Generic drug:  Calcium Carbonate-Vitamin D  Take 1 tablet by mouth daily.     cetirizine 10 MG tablet  Commonly known as:  ZYRTEC  Take 10 mg by mouth as needed for allergies.     ezetimibe-simvastatin 10-40 MG per tablet  Commonly known as:  VYTORIN  Take 1 tablet by mouth 4 (four) times a week.     fish oil-omega-3 fatty acids 1000 MG capsule  Take 1 g by mouth daily.     METROGEL 1 % gel  Generic drug:  metroNIDAZOLE  Apply 1 application topically daily.     multivitamin tablet  Take 1 tablet by mouth daily.     nitroGLYCERIN 0.4 MG SL tablet  Commonly known as:   NITROSTAT  Place 1 tablet (0.4 mg total) under the tongue every 5 (five) minutes as needed for chest pain (up to 3 doses).     ranitidine 150 MG tablet  Commonly known as:  ZANTAC  Take 1 tablet (150 mg total) by mouth 2 (two) times daily as needed for heartburn (acid reflux symptoms).        Disposition   The patient will be discharged in stable condition to home.     Discharge Orders   Future Appointments Provider Department Dept Phone   02/28/2013 9:30 AM Laurey Morale, MD [facility] Main Office [place]) [phone]   Future Orders Complete By Expires     Diet - low sodium heart healthy  As directed     Increase activity slowly  As directed     Comments:      No driving for 2 days. No lifting over 5 lbs for 1 week. No sexual activity for 1 week. Keep procedure site clean & dry. If you notice increased pain, swelling, bleeding or pus, call/return!  You may shower, but no soaking baths/hot tubs/pools for 1 week.      Follow-up Information   Follow up with Marca Ancona, MD. (02/28/13 at 9:30am)    Contact information:   [address] [place] [address] [phone]         Duration of Discharge Encounter: Greater than 30 minutes including physician and PA time.  Signed, Ronie Spies PA-C 02/06/2013, 8:54 AM

## 2013-02-06 NOTE — CV Procedure (Signed)
   Cardiac Catheterization Procedure Note  Name: Carol Cox MRN: [id number] DOB: Mar 01, 1951  Procedure: Left Heart Cath, Selective Coronary Angiography, LV angiography  Indication: Unstable angina   Procedural Details: Allens test on right wrist was positive.  The right wrist was prepped, draped, and anesthetized with 1% lidocaine. Using the modified Seldinger technique, a 5 French sheath was introduced into the right radial artery. 3 mg of verapamil was administered through the sheath, weight-based unfractionated heparin was administered intravenously. Standard Judkins catheters were used for selective coronary angiography and left ventriculography. Catheter exchanges were performed over an exchange length guidewire. There were no immediate procedural complications. A TR band was used for radial hemostasis at the completion of the procedure.  The patient was transferred to the post catheterization recovery area for further monitoring.  Procedural Findings: Hemodynamics: AO 130/81 LV 128/11  Coronary angiography: Coronary dominance: right  Left mainstem: Short vessel, no significant CAD.   Left anterior descending (LAD): 25% mid LAD stenosis.   Left circumflex (LCx): No significant CAD.   Right coronary artery (RCA): No significant CAD.  Left ventriculography: Left ventricular systolic function is normal, LVEF is estimated at 65-70%, there is no significant mitral regurgitation   Final Conclusions:  Mild nonobstructive CAD.  Suspect noncardiac chest pain.  May go home today.  Continue home statin and continue ASA 81 mg daily.   Marca Ancona 02/06/2013, 8:27 AM

## 2013-02-06 NOTE — H&P (View-Only) (Signed)
Subjective:  The patient is feeling well this morning.  No chest pain overnight.  Rhythm stable on telemetry.  Cardiac enzymes are negative for myocardial infarction.  She is on IV heparin awaiting cardiac catheterization tomorrow. Granted request to take a shower.  Objective:  Vital Signs in the last 24 hours: Temp:  [98 F (36.7 C)-99.4 F (37.4 C)] 98 F (36.7 C) (04/13 0501) Pulse Rate:  [54-87] 54 (04/13 0501) Resp:  [20] 20 (04/13 0501) BP: (121-133)/(62-78) 133/62 mmHg (04/13 0501) SpO2:  [97 %-98 %] 98 % (04/13 0501)  Intake/Output from previous day: 04/12 0701 - 04/13 0700 In: 360 [P.O.:360] Out: -  Intake/Output from this shift:    . aspirin EC  81 mg Oral Daily  . ezetimibe-simvastatin  1 tablet Oral 4 times weekly  . metoprolol tartrate  12.5 mg Oral BID  . sodium chloride  3 mL Intravenous Q12H  . sodium chloride  3 mL Intravenous Q12H   . [START ON 02/06/2013] sodium chloride    . heparin 1,200 Units/hr (02/04/13 0230)    Physical Exam: The patient appears to be in no distress.  Head and neck exam reveals that the pupils are equal and reactive.  The extraocular movements are full.  There is no scleral icterus.  Mouth and pharynx are benign.  No lymphadenopathy.  No carotid bruits.  The jugular venous pressure is normal.  Thyroid is not enlarged or tender.  Chest is clear to percussion and auscultation.  No rales or rhonchi.  Expansion of the chest is symmetrical.  Heart reveals no abnormal lift or heave.  First and second heart sounds are normal.  There is no  gallop rub or click.  Soft systolic ejection murmur at base.  The abdomen is soft and nontender.  Bowel sounds are normoactive.  There is no hepatosplenomegaly or mass.  There are no abdominal bruits.  Extremities reveal no phlebitis or edema.  Pedal pulses are good.  There is no cyanosis or clubbing.  Neurologic exam is normal strength and no lateralizing weakness.  No sensory  deficits.  Integument reveals no rash  Lab Results:  Recent Labs  02/04/13 0057 02/05/13 0435  WBC 6.0 6.2  HGB 12.7 13.9  PLT 202 205    Recent Labs  02/03/13 1500 02/04/13 0057  NA 141 137  K 3.8 3.6  CL 109 107  CO2 22 23  GLUCOSE 104* 110*  BUN 25* 20  CREATININE 0.89 0.91    Recent Labs  02/04/13 0530 02/04/13 1225  TROPONINI <0.30 <0.30   Hepatic Function Panel  Recent Labs  02/04/13 0057  PROT 5.9*  ALBUMIN 3.1*  AST 21  ALT 20  ALKPHOS 54  BILITOT 0.3    Recent Labs  02/04/13 0530  CHOL 143   No results found for this basename: PROTIME,  in the last 72 hours  Imaging: Dg Chest 2 View  02/03/2013  *RADIOLOGY REPORT*  Clinical Data: Chest discomfort with shortness of breath and weakness.  CHEST - 2 VIEW  Comparison: 11/17/2007.  Findings: Trachea is midline.  Heart size normal, lungs are low in volume with bilateral infrahilar air space disease.  No pleural fluid.  IMPRESSION: Low lung volumes with mild bilateral infrahilar air space disease.   Original Report Authenticated By: Leanna Battles, M.D.     Cardiac Studies: EKG this morning shows no ischemic changes. Assessment/Plan:  CHEST PAIN: She has symptoms consistent with obstructive CAD and a very remarkable family history with I  suspect familial dyslipidemia. The pretest probability of obstructive CAD is high. HYPERLIPIDEMIA: She has been intolerant of multiple medications. She is tolerating the current medication four times per week.  Results of lipid panel are satisfactory with an LDL 78 and HDL 49 HYPERGLYCEMIA mild.  A1C normal 5.6 Plan: Continue IV heparin over weekend.  Left heart cardiac catheterization in a.m. All questions answered.   LOS: 2 days    Cassell Clement 02/05/2013, 8:04 AM

## 2013-02-21 ENCOUNTER — Encounter: Payer: Self-pay | Admitting: *Deleted

## 2013-02-28 ENCOUNTER — Encounter: Payer: Self-pay | Admitting: Cardiology

## 2013-02-28 ENCOUNTER — Ambulatory Visit (INDEPENDENT_AMBULATORY_CARE_PROVIDER_SITE_OTHER): Payer: BC Managed Care – PPO | Admitting: Cardiology

## 2013-02-28 VITALS — BP 146/86 | HR 75 | Ht 66.0 in | Wt 200.0 lb

## 2013-02-28 DIAGNOSIS — R079 Chest pain, unspecified: Secondary | ICD-10-CM | POA: Insufficient documentation

## 2013-02-28 DIAGNOSIS — I2510 Atherosclerotic heart disease of native coronary artery without angina pectoris: Secondary | ICD-10-CM

## 2013-02-28 DIAGNOSIS — E785 Hyperlipidemia, unspecified: Secondary | ICD-10-CM | POA: Insufficient documentation

## 2013-02-28 DIAGNOSIS — R55 Syncope and collapse: Secondary | ICD-10-CM

## 2013-02-28 LAB — LIPID PANEL
HDL: 42.2 mg/dL (ref >39.00)
Total CHOL/HDL Ratio: 5
Triglycerides: 295 mg/dL — ABNORMAL HIGH (ref 0.0–149.0)

## 2013-02-28 LAB — HEPATIC FUNCTION PANEL: Total Bilirubin: 0.4 mg/dL (ref 0.3–1.2)

## 2013-02-28 NOTE — Patient Instructions (Addendum)
Your physician recommends that you have a FASTING lipid profile /liver profile today.   Your physician wants you to follow-up in: 1 year with Dr Shirlee Latch. (May 2015).You will receive a reminder letter in the mail two months in advance. If you don't receive a letter, please call our office to schedule the follow-up appointment.

## 2013-02-28 NOTE — Progress Notes (Signed)
Patient ID: Carol Cox, female   DOB: Dec 09, 1950, 62 y.o.   MRN: [id number] PCP: Dr. Benedetto Goad  62 yo with strong family history of CAD presents for cardiology followup after recent admission to [facility] with nausea, chest pain, and lightheadedness in 4/14.   Patient was cleaning her house and began to feel nauseated and diaphoretic.  She also had mild chest tightness.  She was lightheaded and almost passed out but did not pass out.  She was brought to the ER and admitted. She ruled out for MI. Given her family history, she ended up getting a catheterization which showed mild luminal irregularities.   Since discharge, she has done well.  No syncope or presyncope, no chest tightness.  No exertional dyspnea.  No orthopnea, PND, or bendopnea.   Of note, she does have a distant history of what sound like vasovagal events.    ECG: NSR, normal  Labs (4/14): BNP 47, K 3.7, creatinine 0.45  PMH: 1. Hyperlipidemia 2. GERD 3. Possible vasovagal symptoms 4. LHC (4/14) with EF 65-70%, 25% mLAD.    SH: Married, lives in [place], retired Runner, broadcasting/film/video, nonsmoker.   FH: Father with SCD at 61, sister with 1st MI at 25, uncles with MIs at 61 and 13.   ROS: All systems reviewed and negative except as per HPI.   Current Outpatient Prescriptions  Medication Sig Dispense Refill  . aspirin 81 MG tablet Take 81 mg by mouth daily.        . Calcium Carbonate-Vitamin D (CALTRATE 600+D) 600-400 MG-UNIT per tablet Take 1 tablet by mouth daily.        . cetirizine (ZYRTEC) 10 MG tablet Take 10 mg by mouth as needed for allergies.       Marland Kitchen ezetimibe-simvastatin (VYTORIN) 10-40 MG per tablet Take 1 tablet by mouth 4 (four) times a week.       . fish oil-omega-3 fatty acids 1000 MG capsule Take 1 g by mouth daily.        Marland Kitchen METROGEL 1 % gel Apply 1 application topically daily.       . Multiple Vitamin (MULTIVITAMIN) tablet Take 1 tablet by mouth daily.        . ranitidine (ZANTAC) 150 MG tablet Take 1 tablet (150  mg total) by mouth 2 (two) times daily as needed for heartburn (acid reflux symptoms).      . nitroGLYCERIN (NITROSTAT) 0.4 MG SL tablet Place 1 tablet (0.4 mg total) under the tongue every 5 (five) minutes as needed for chest pain (up to 3 doses).  25 tablet  4   No current facility-administered medications for this visit.    BP 146/86  Pulse 75  Ht 5\' 6"  (1.676 m)  Wt 200 lb (90.719 kg)  BMI 32.3 kg/m2 General: NAD Neck: No JVD, no thyromegaly or thyroid nodule.  Lungs: Clear to auscultation bilaterally with normal respiratory effort. CV: Nondisplaced PMI.  Heart regular S1/S2, no S3/S4, no murmur.  No peripheral edema.  No carotid bruit.  Normal pedal pulses.  Abdomen: Soft, nontender, no hepatosplenomegaly, no distention.  Skin: Intact without lesions or rashes.  Neurologic: Alert and oriented x 3.  Psych: Normal affect. Extremities: No clubbing or cyanosis.   Assessment/Plan: 1. CAD: Mild on LHC.  Given family history with mild CAD, would continue ASA 81 and statin.  2. Presyncope/nausea/diaphoresis: I think that the spell that brought Carol Cox to the ER may have been a vasovagal event.  She had been on her  feet for a while.  She does have a history of "fainting" in the distant past that sounds like it could be vasovagal.   3. Hyperlipidemia: Check lipids on current statin.   Followup in 1 year.   Marca Ancona 02/28/2013

## 2013-03-01 LAB — LDL CHOLESTEROL, DIRECT: Direct LDL: 131.7 mg/dL

## 2013-03-03 ENCOUNTER — Other Ambulatory Visit: Payer: Self-pay | Admitting: *Deleted

## 2013-03-03 DIAGNOSIS — E785 Hyperlipidemia, unspecified: Secondary | ICD-10-CM

## 2013-03-03 MED ORDER — EZETIMIBE-SIMVASTATIN 10-40 MG PO TABS: 1.0000 | ORAL_TABLET | Freq: Every day | ORAL | Status: DC

## 2013-03-03 NOTE — Telephone Encounter (Signed)
Have called pt and given her lab results & recommendations to take her Vytorin daily with repeat lipid/lfts in 2months. Pt needed new prescription called in & have done so. Lab appt made for mid July & mailed appt card to pt. Mylo Red RN

## 2013-03-03 NOTE — Telephone Encounter (Signed)
Message copied by Barrie Folk on Fri Mar 03, 2013  4:08 PM ------      Message from: Laurey Morale      Created: Fri Mar 03, 2013  4:02 PM       LDL is elevated.  Take Vytorin every day rather than every other day and repeat lipids/LFTs in 2 months. ------

## 2013-05-09 ENCOUNTER — Other Ambulatory Visit (INDEPENDENT_AMBULATORY_CARE_PROVIDER_SITE_OTHER): Payer: BC Managed Care – PPO

## 2013-05-09 DIAGNOSIS — E785 Hyperlipidemia, unspecified: Secondary | ICD-10-CM

## 2013-05-09 LAB — LIPID PANEL
Cholesterol: 170 mg/dL (ref 0–200)
HDL: 40.6 mg/dL (ref >39.00)
VLDL: 42.8 mg/dL — ABNORMAL HIGH (ref 0.0–40.0)

## 2013-05-09 LAB — HEPATIC FUNCTION PANEL
ALT: 26 U/L (ref 0–35)
Albumin: 3.9 g/dL (ref 3.5–5.2)
Alkaline Phosphatase: 62 U/L (ref 39–117)
Total Protein: 7.3 g/dL (ref 6.0–8.3)

## 2013-05-16 ENCOUNTER — Telehealth: Payer: Self-pay | Admitting: Cardiology

## 2013-05-16 DIAGNOSIS — E785 Hyperlipidemia, unspecified: Secondary | ICD-10-CM

## 2013-05-16 NOTE — Telephone Encounter (Signed)
Spoke with patient about recent lab results 

## 2013-05-16 NOTE — Telephone Encounter (Signed)
New Prob     Pt calling following up on blood work results. Please call.

## 2013-11-16 ENCOUNTER — Other Ambulatory Visit: Payer: BC Managed Care – PPO

## 2013-11-24 ENCOUNTER — Other Ambulatory Visit (INDEPENDENT_AMBULATORY_CARE_PROVIDER_SITE_OTHER): Payer: BC Managed Care – PPO

## 2013-11-24 DIAGNOSIS — E785 Hyperlipidemia, unspecified: Secondary | ICD-10-CM

## 2013-11-24 LAB — LIPID PANEL
Cholesterol: 153 mg/dL (ref 0–200)
HDL: 43.4 mg/dL (ref >39.00)
LDL CALC: 81 mg/dL (ref 0–99)
Total CHOL/HDL Ratio: 4
Triglycerides: 145 mg/dL (ref 0.0–149.0)
VLDL: 29 mg/dL (ref 0.0–40.0)

## 2013-11-30 ENCOUNTER — Telehealth: Payer: Self-pay | Admitting: Cardiology

## 2013-11-30 NOTE — Telephone Encounter (Signed)
Spoke with patient about recent lab results 

## 2013-11-30 NOTE — Telephone Encounter (Signed)
New message ° °Patient is returning your call, please call back. °

## 2014-02-13 ENCOUNTER — Ambulatory Visit: Payer: BC Managed Care – PPO | Attending: Orthopedic Surgery | Admitting: Physical Therapy

## 2014-02-13 DIAGNOSIS — M25559 Pain in unspecified hip: Secondary | ICD-10-CM | POA: Diagnosis not present

## 2014-02-13 DIAGNOSIS — R5381 Other malaise: Secondary | ICD-10-CM | POA: Diagnosis not present

## 2014-02-13 DIAGNOSIS — IMO0001 Reserved for inherently not codable concepts without codable children: Secondary | ICD-10-CM | POA: Insufficient documentation

## 2014-02-13 DIAGNOSIS — M25569 Pain in unspecified knee: Secondary | ICD-10-CM | POA: Insufficient documentation

## 2014-02-15 ENCOUNTER — Ambulatory Visit: Payer: BC Managed Care – PPO | Admitting: Physical Therapy

## 2014-02-15 DIAGNOSIS — IMO0001 Reserved for inherently not codable concepts without codable children: Secondary | ICD-10-CM | POA: Diagnosis not present

## 2014-02-20 ENCOUNTER — Ambulatory Visit: Payer: BC Managed Care – PPO | Admitting: Physical Therapy

## 2014-02-20 DIAGNOSIS — IMO0001 Reserved for inherently not codable concepts without codable children: Secondary | ICD-10-CM | POA: Diagnosis not present

## 2014-02-22 ENCOUNTER — Ambulatory Visit: Payer: BC Managed Care – PPO | Admitting: Physical Therapy

## 2014-02-22 DIAGNOSIS — IMO0001 Reserved for inherently not codable concepts without codable children: Secondary | ICD-10-CM | POA: Diagnosis not present

## 2014-02-27 ENCOUNTER — Ambulatory Visit: Payer: BC Managed Care – PPO | Attending: Orthopedic Surgery | Admitting: Physical Therapy

## 2014-02-27 DIAGNOSIS — M25559 Pain in unspecified hip: Secondary | ICD-10-CM | POA: Insufficient documentation

## 2014-02-27 DIAGNOSIS — IMO0001 Reserved for inherently not codable concepts without codable children: Secondary | ICD-10-CM | POA: Insufficient documentation

## 2014-02-27 DIAGNOSIS — R5381 Other malaise: Secondary | ICD-10-CM | POA: Diagnosis not present

## 2014-02-27 DIAGNOSIS — M25569 Pain in unspecified knee: Secondary | ICD-10-CM | POA: Insufficient documentation

## 2014-03-02 ENCOUNTER — Ambulatory Visit: Payer: BC Managed Care – PPO | Admitting: *Deleted

## 2014-03-02 DIAGNOSIS — IMO0001 Reserved for inherently not codable concepts without codable children: Secondary | ICD-10-CM | POA: Diagnosis not present

## 2014-03-06 ENCOUNTER — Ambulatory Visit: Payer: BC Managed Care – PPO | Admitting: Physical Therapy

## 2014-03-06 DIAGNOSIS — IMO0001 Reserved for inherently not codable concepts without codable children: Secondary | ICD-10-CM | POA: Diagnosis not present

## 2014-03-08 ENCOUNTER — Ambulatory Visit: Payer: BC Managed Care – PPO | Admitting: Physical Therapy

## 2014-03-08 DIAGNOSIS — IMO0001 Reserved for inherently not codable concepts without codable children: Secondary | ICD-10-CM | POA: Diagnosis not present

## 2014-03-12 ENCOUNTER — Ambulatory Visit: Payer: BC Managed Care – PPO | Admitting: Physical Therapy

## 2014-03-12 DIAGNOSIS — IMO0001 Reserved for inherently not codable concepts without codable children: Secondary | ICD-10-CM | POA: Diagnosis not present

## 2014-03-13 ENCOUNTER — Ambulatory Visit: Payer: BC Managed Care – PPO | Admitting: Physical Therapy

## 2014-03-13 DIAGNOSIS — IMO0001 Reserved for inherently not codable concepts without codable children: Secondary | ICD-10-CM | POA: Diagnosis not present

## 2014-03-15 ENCOUNTER — Ambulatory Visit: Payer: BC Managed Care – PPO | Admitting: Physical Therapy

## 2014-03-15 DIAGNOSIS — IMO0001 Reserved for inherently not codable concepts without codable children: Secondary | ICD-10-CM | POA: Diagnosis not present

## 2014-03-20 ENCOUNTER — Ambulatory Visit: Payer: BC Managed Care – PPO | Admitting: Physical Therapy

## 2014-03-20 DIAGNOSIS — IMO0001 Reserved for inherently not codable concepts without codable children: Secondary | ICD-10-CM | POA: Diagnosis not present

## 2014-03-22 ENCOUNTER — Encounter: Payer: BC Managed Care – PPO | Admitting: Physical Therapy

## 2014-04-16 ENCOUNTER — Other Ambulatory Visit: Payer: Self-pay | Admitting: Cardiology

## 2014-05-14 ENCOUNTER — Other Ambulatory Visit: Payer: Self-pay | Admitting: Cardiology

## 2014-07-12 ENCOUNTER — Other Ambulatory Visit: Payer: Self-pay | Admitting: Obstetrics and Gynecology

## 2014-07-13 LAB — CYTOLOGY - PAP

## 2014-07-14 ENCOUNTER — Emergency Department (HOSPITAL_COMMUNITY)
Admission: EM | Admit: 2014-07-14 | Discharge: 2014-07-14 | Disposition: A | Discharge status: Home / Self Care | Payer: BC Managed Care – PPO | Attending: Emergency Medicine | Admitting: Emergency Medicine

## 2014-07-14 ENCOUNTER — Emergency Department (HOSPITAL_COMMUNITY): Payer: BC Managed Care – PPO

## 2014-07-14 ENCOUNTER — Encounter (HOSPITAL_COMMUNITY): Payer: Self-pay | Admitting: Emergency Medicine

## 2014-07-14 DIAGNOSIS — M1712 Unilateral primary osteoarthritis, left knee: Secondary | ICD-10-CM

## 2014-07-14 DIAGNOSIS — M25569 Pain in unspecified knee: Secondary | ICD-10-CM | POA: Diagnosis present

## 2014-07-14 DIAGNOSIS — G8929 Other chronic pain: Secondary | ICD-10-CM | POA: Diagnosis not present

## 2014-07-14 DIAGNOSIS — E785 Hyperlipidemia, unspecified: Secondary | ICD-10-CM | POA: Insufficient documentation

## 2014-07-14 DIAGNOSIS — Z79899 Other long term (current) drug therapy: Secondary | ICD-10-CM | POA: Insufficient documentation

## 2014-07-14 DIAGNOSIS — M1710 Unilateral primary osteoarthritis, unspecified knee: Secondary | ICD-10-CM | POA: Insufficient documentation

## 2014-07-14 DIAGNOSIS — R52 Pain, unspecified: Secondary | ICD-10-CM | POA: Insufficient documentation

## 2014-07-14 DIAGNOSIS — IMO0002 Reserved for concepts with insufficient information to code with codable children: Principal | ICD-10-CM

## 2014-07-14 DIAGNOSIS — Z7982 Long term (current) use of aspirin: Secondary | ICD-10-CM | POA: Insufficient documentation

## 2014-07-14 DIAGNOSIS — M25562 Pain in left knee: Secondary | ICD-10-CM

## 2014-07-14 DIAGNOSIS — I2510 Atherosclerotic heart disease of native coronary artery without angina pectoris: Secondary | ICD-10-CM | POA: Diagnosis not present

## 2014-07-14 MED ORDER — OXYCODONE-ACETAMINOPHEN 10-325 MG PO TABS: 1.0000 | ORAL_TABLET | ORAL | Status: DC | PRN

## 2014-07-14 MED ORDER — OXYCODONE-ACETAMINOPHEN 5-325 MG PO TABS
2.0000 | ORAL_TABLET | Freq: Once | ORAL | Status: AC
  Administered 2014-07-14: 2 via ORAL
  Filled 2014-07-14: qty 2

## 2014-07-14 MED ORDER — FENTANYL CITRATE 0.05 MG/ML IJ SOLN
50.0000 ug | INTRAMUSCULAR | Status: DC | PRN
  Administered 2014-07-14: 50 ug via INTRAVENOUS
  Filled 2014-07-14: qty 2

## 2014-07-14 NOTE — ED Provider Notes (Signed)
CSN: [id number]     Arrival date & time 07/14/14  1631 History   First MD Initiated Contact with Patient 07/14/14 1702     Chief Complaint  Patient presents with  . Knee Pain     (Consider location/radiation/quality/duration/timing/severity/associated sxs/prior Treatment) The history is provided by the patient and medical records. No language interpreter was used.    Carol Cox is a 63 y.o. female  with a hx of arthritis presents to the Emergency Department complaining of gradual, persistent, progressively worsening left knee pain worsening today while walking.  Pt reports MRI in March which dx tendonitis.  Pt reports it has been worsening for several weeks, but while walking twisted it today causing excruciating pain.  Pt reports using ice without relief.  Pt is also taking meloxicam for pain every day with some relief.  Pt has seen Dr. Veverly Fells at [facility] for treatment.  Pt was given cortisone shot at the beginning of the year without significant relief.  Associated symptoms include knee swelling.  Nothing makes it better and nothing makes it worse.  Pt denies fever, chills, N/V, history of gout, IV drug use or septic joint.     Past Medical History  Diagnosis Date  . Hyperlipidemia   . Arthritis     "joints of left knee, left hip, ankles" (02/04/2013)  . CAD (coronary artery disease)     Mild nonobstructive CAD by cath 02/06/13 (25% mid LAD), normal EF   Past Surgical History  Procedure Laterality Date  . Carpal tunnel release Bilateral 1990's  . Left heart cath  02/06/13   Family History  Problem Relation Age of Onset  . Sudden death Father 2    Presumed heart attack  . CAD Sister 25  . CAD Maternal Uncle 53  . CAD Maternal Uncle 73  . Heart attack Sister 53  . Heart attack Maternal Uncle   . Heart attack Maternal Uncle    History  Substance Use Topics  . Smoking status: Never Smoker   . Smokeless tobacco: Never Used  . Alcohol Use: No   OB History   Grav  Para Term Preterm Abortions TAB SAB Ect Mult Living                 Review of Systems  Constitutional: Negative for fever and chills.  Gastrointestinal: Negative for nausea and vomiting.  Musculoskeletal: Positive for arthralgias and joint swelling. Negative for back pain, neck pain and neck stiffness.  Skin: Negative for wound.  Neurological: Negative for numbness.  Hematological: Does not bruise/bleed easily.  Psychiatric/Behavioral: The patient is not nervous/anxious.   All other systems reviewed and are negative.     Allergies  Crestor and Lipitor  Home Medications   Prior to Admission medications   Medication Sig Start Date End Date Taking? Authorizing Provider  aspirin 81 MG tablet Take 81 mg by mouth daily.     Yes Historical Provider, MD  cetirizine (ZYRTEC) 10 MG tablet Take 10 mg by mouth as needed for allergies.    Yes Historical Provider, MD  fish oil-omega-3 fatty acids 1000 MG capsule Take 1 g by mouth daily.     Yes Historical Provider, MD  meloxicam (MOBIC) 15 MG tablet Take 1 tablet by mouth daily. 05/31/14  Yes Historical Provider, MD  METROGEL 1 % gel Apply 1 application topically daily.  07/22/11  Yes Historical Provider, MD  Multiple Vitamin (MULTIVITAMIN) tablet Take 1 tablet by mouth daily.     Yes  Historical Provider, MD  nitroGLYCERIN (NITROSTAT) 0.4 MG SL tablet Place 1 tablet (0.4 mg total) under the tongue every 5 (five) minutes as needed for chest pain (up to 3 doses). 02/06/13  Yes Dayna N Dunn, PA-C  VYTORIN 10-40 MG per tablet TAKE ONE TABLET BY MOUTH AT BEDTIME   Yes Larey Dresser, MD  oxyCODONE-acetaminophen (PERCOCET) 10-325 MG per tablet Take 1 tablet by mouth every 4 (four) hours as needed for pain. 07/14/14   Santiana Glidden, PA-C   BP 126/74  Pulse 77  Temp(Src) 98.6 F (37 C) (Oral)  Resp 16  SpO2 99% Physical Exam  Nursing note and vitals reviewed. Constitutional: She appears well-developed and well-nourished. No distress.  HENT:   Head: Normocephalic and atraumatic.  Eyes: Conjunctivae are normal.  Neck: Normal range of motion.  Cardiovascular: Normal rate, regular rhythm, normal heart sounds and intact distal pulses.   No murmur heard. Capillary refill < 3 sec  Pulmonary/Chest: Effort normal and breath sounds normal.  Musculoskeletal: She exhibits tenderness. She exhibits no edema.  ROM: decreased ROM to the left knee due to pain - full extension and mildly restricted flexion No erythema, induration or increased warmth to the joint Mild joint effusion noted  Neurological: She is alert. Coordination normal.  Sensation intact to dull and sharp Strength 4/5 with flexion and extension of the knee due to pain, 5/5 strength in left hip, left ankle and with dorsiflexion and plantar flexion  Skin: Skin is warm and dry. She is not diaphoretic. No erythema.  No tenting of the skin  Psychiatric: She has a normal mood and affect.    ED Course  Procedures (including critical care time) Labs Review Labs Reviewed - No data to display  Imaging Review Dg Knee Complete 4 Views Left  07/14/2014   CLINICAL DATA:  Two weeks of left knee pain.  No injury.  EXAM: LEFT KNEE - COMPLETE 4+ VIEW  COMPARISON:  12/07/2013.  FINDINGS: No fracture or bone lesion. Knee joint is normally aligned. Mild narrowing of the patellofemoral joint space compartment. There are small marginal osteophytes from the patella and minimally from the lateral compartment. No other arthropathic change. No joint effusion. Soft tissues are unremarkable.  IMPRESSION: 1. No fracture or acute finding. 2. Mild degenerative changes.   Electronically Signed   By: Lajean Manes M.D.   On: 07/14/2014 18:33     EKG Interpretation None      MDM   Final diagnoses:  Arthritis of left knee  Left knee pain   Carol Cox presents with acute on chronic left knee pain, worse today.  Patient without fall or known trauma. Patient reports previous MRI with diagnosis of  tendinitis.  Will repeat x-ray today to assess for progressive degenerative joint changes.  6:56 PM Patient X-Ray negative for obvious fracture or dislocation. Evidence of progressive degenerative joint changes and mild arthritis. Pain managed in ED. patient is able to stand but is unable to walk due to pain. Without mechanism of injury highly doubt tibial plateau fracture. Pt advised to follow up with orthopedics for further evaluation and treatment. Patient given brace and crutches while in ED, conservative therapy recommended and discussed. Patient will be dc home & is agreeable with above plan.  BP 126/74  Pulse 77  Temp(Src) 98.6 F (37 C) (Oral)  Resp 16  SpO2 99%      Abigail Butts, PA-C 07/14/14 1859

## 2014-07-14 NOTE — Discharge Instructions (Signed)
1. Medications: percocet, usual home medications including your meloxicam 2. Treatment: rest, drink plenty of fluids, ice, elevate, use brace and crutches 3. Follow Up: Please followup with your primary doctor  and Dr. Veverly Fells for discussion of your diagnoses and further evaluation after today's visit;

## 2014-07-14 NOTE — ED Notes (Signed)
Pt reports hx of left knee pain, reports having MRI done in past and was told pain was just tendonitis. Reports walking this morning and felt like she twisted her knee again and now having severe pain.

## 2014-07-15 NOTE — ED Provider Notes (Signed)
Medical screening examination/treatment/procedure(s) were performed by non-physician practitioner and as supervising physician I was immediately available for consultation/collaboration.   EKG Interpretation None        Houston Siren III, MD 07/15/14 1530

## 2014-07-26 HISTORY — PX: KNEE ARTHROSCOPY W/ MENISCECTOMY: SHX1879

## 2014-09-06 ENCOUNTER — Ambulatory Visit: Payer: BC Managed Care – PPO | Attending: Orthopedic Surgery | Admitting: Physical Therapy

## 2014-09-06 DIAGNOSIS — M25669 Stiffness of unspecified knee, not elsewhere classified: Secondary | ICD-10-CM | POA: Insufficient documentation

## 2014-09-06 DIAGNOSIS — M25562 Pain in left knee: Secondary | ICD-10-CM | POA: Diagnosis not present

## 2014-09-11 ENCOUNTER — Ambulatory Visit: Payer: BC Managed Care – PPO | Admitting: Physical Therapy

## 2014-09-11 DIAGNOSIS — M25562 Pain in left knee: Secondary | ICD-10-CM | POA: Diagnosis not present

## 2014-09-13 ENCOUNTER — Encounter: Payer: BC Managed Care – PPO | Admitting: *Deleted

## 2014-09-18 ENCOUNTER — Ambulatory Visit: Payer: BC Managed Care – PPO | Admitting: Physical Therapy

## 2014-09-18 DIAGNOSIS — M25562 Pain in left knee: Secondary | ICD-10-CM | POA: Diagnosis not present

## 2014-09-25 ENCOUNTER — Ambulatory Visit: Payer: BC Managed Care – PPO | Attending: Orthopedic Surgery | Admitting: Physical Therapy

## 2014-09-25 DIAGNOSIS — M25562 Pain in left knee: Secondary | ICD-10-CM | POA: Insufficient documentation

## 2014-09-25 DIAGNOSIS — M25669 Stiffness of unspecified knee, not elsewhere classified: Secondary | ICD-10-CM | POA: Insufficient documentation

## 2014-09-27 ENCOUNTER — Ambulatory Visit: Payer: BC Managed Care – PPO | Admitting: Physical Therapy

## 2014-09-27 DIAGNOSIS — M25562 Pain in left knee: Secondary | ICD-10-CM | POA: Diagnosis not present

## 2014-10-02 ENCOUNTER — Ambulatory Visit: Payer: BC Managed Care – PPO | Admitting: Physical Therapy

## 2014-10-02 DIAGNOSIS — M25562 Pain in left knee: Secondary | ICD-10-CM | POA: Diagnosis not present

## 2014-10-04 ENCOUNTER — Encounter (HOSPITAL_COMMUNITY): Payer: Self-pay | Admitting: Cardiovascular Disease

## 2014-10-04 ENCOUNTER — Ambulatory Visit: Payer: BC Managed Care – PPO | Admitting: Physical Therapy

## 2014-10-04 DIAGNOSIS — M25562 Pain in left knee: Secondary | ICD-10-CM | POA: Diagnosis not present

## 2014-10-09 ENCOUNTER — Ambulatory Visit: Payer: BC Managed Care – PPO | Admitting: Physical Therapy

## 2014-10-09 DIAGNOSIS — M25562 Pain in left knee: Secondary | ICD-10-CM | POA: Diagnosis not present

## 2014-10-11 ENCOUNTER — Ambulatory Visit: Payer: BC Managed Care – PPO | Admitting: Physical Therapy

## 2014-10-11 DIAGNOSIS — M25562 Pain in left knee: Secondary | ICD-10-CM | POA: Diagnosis not present

## 2014-10-16 ENCOUNTER — Ambulatory Visit: Payer: BC Managed Care – PPO | Admitting: Physical Therapy

## 2014-10-16 DIAGNOSIS — M25562 Pain in left knee: Secondary | ICD-10-CM | POA: Diagnosis not present

## 2014-10-23 ENCOUNTER — Ambulatory Visit: Payer: BC Managed Care – PPO | Admitting: Physical Therapy

## 2014-10-23 DIAGNOSIS — M25562 Pain in left knee: Secondary | ICD-10-CM | POA: Diagnosis not present

## 2014-10-30 ENCOUNTER — Ambulatory Visit: Payer: BC Managed Care – PPO | Attending: Orthopedic Surgery | Admitting: Physical Therapy

## 2014-10-30 DIAGNOSIS — M25669 Stiffness of unspecified knee, not elsewhere classified: Secondary | ICD-10-CM | POA: Insufficient documentation

## 2014-10-30 DIAGNOSIS — M25562 Pain in left knee: Secondary | ICD-10-CM | POA: Insufficient documentation

## 2014-11-01 ENCOUNTER — Encounter: Payer: BC Managed Care – PPO | Admitting: Physical Therapy

## 2014-11-06 ENCOUNTER — Encounter: Payer: BC Managed Care – PPO | Admitting: Physical Therapy

## 2014-11-08 ENCOUNTER — Encounter: Payer: BC Managed Care – PPO | Admitting: Physical Therapy

## 2014-11-22 ENCOUNTER — Other Ambulatory Visit: Payer: Self-pay | Admitting: Cardiology

## 2014-11-25 IMAGING — CR DG CHEST 2V
2 series · 2 of 2 positions shown · non-contrast
Comparison: 11/17/2007.

CLINICAL DATA: Chest discomfort with shortness of breath and
weakness.

CHEST - 2 VIEW

[w chest pa]
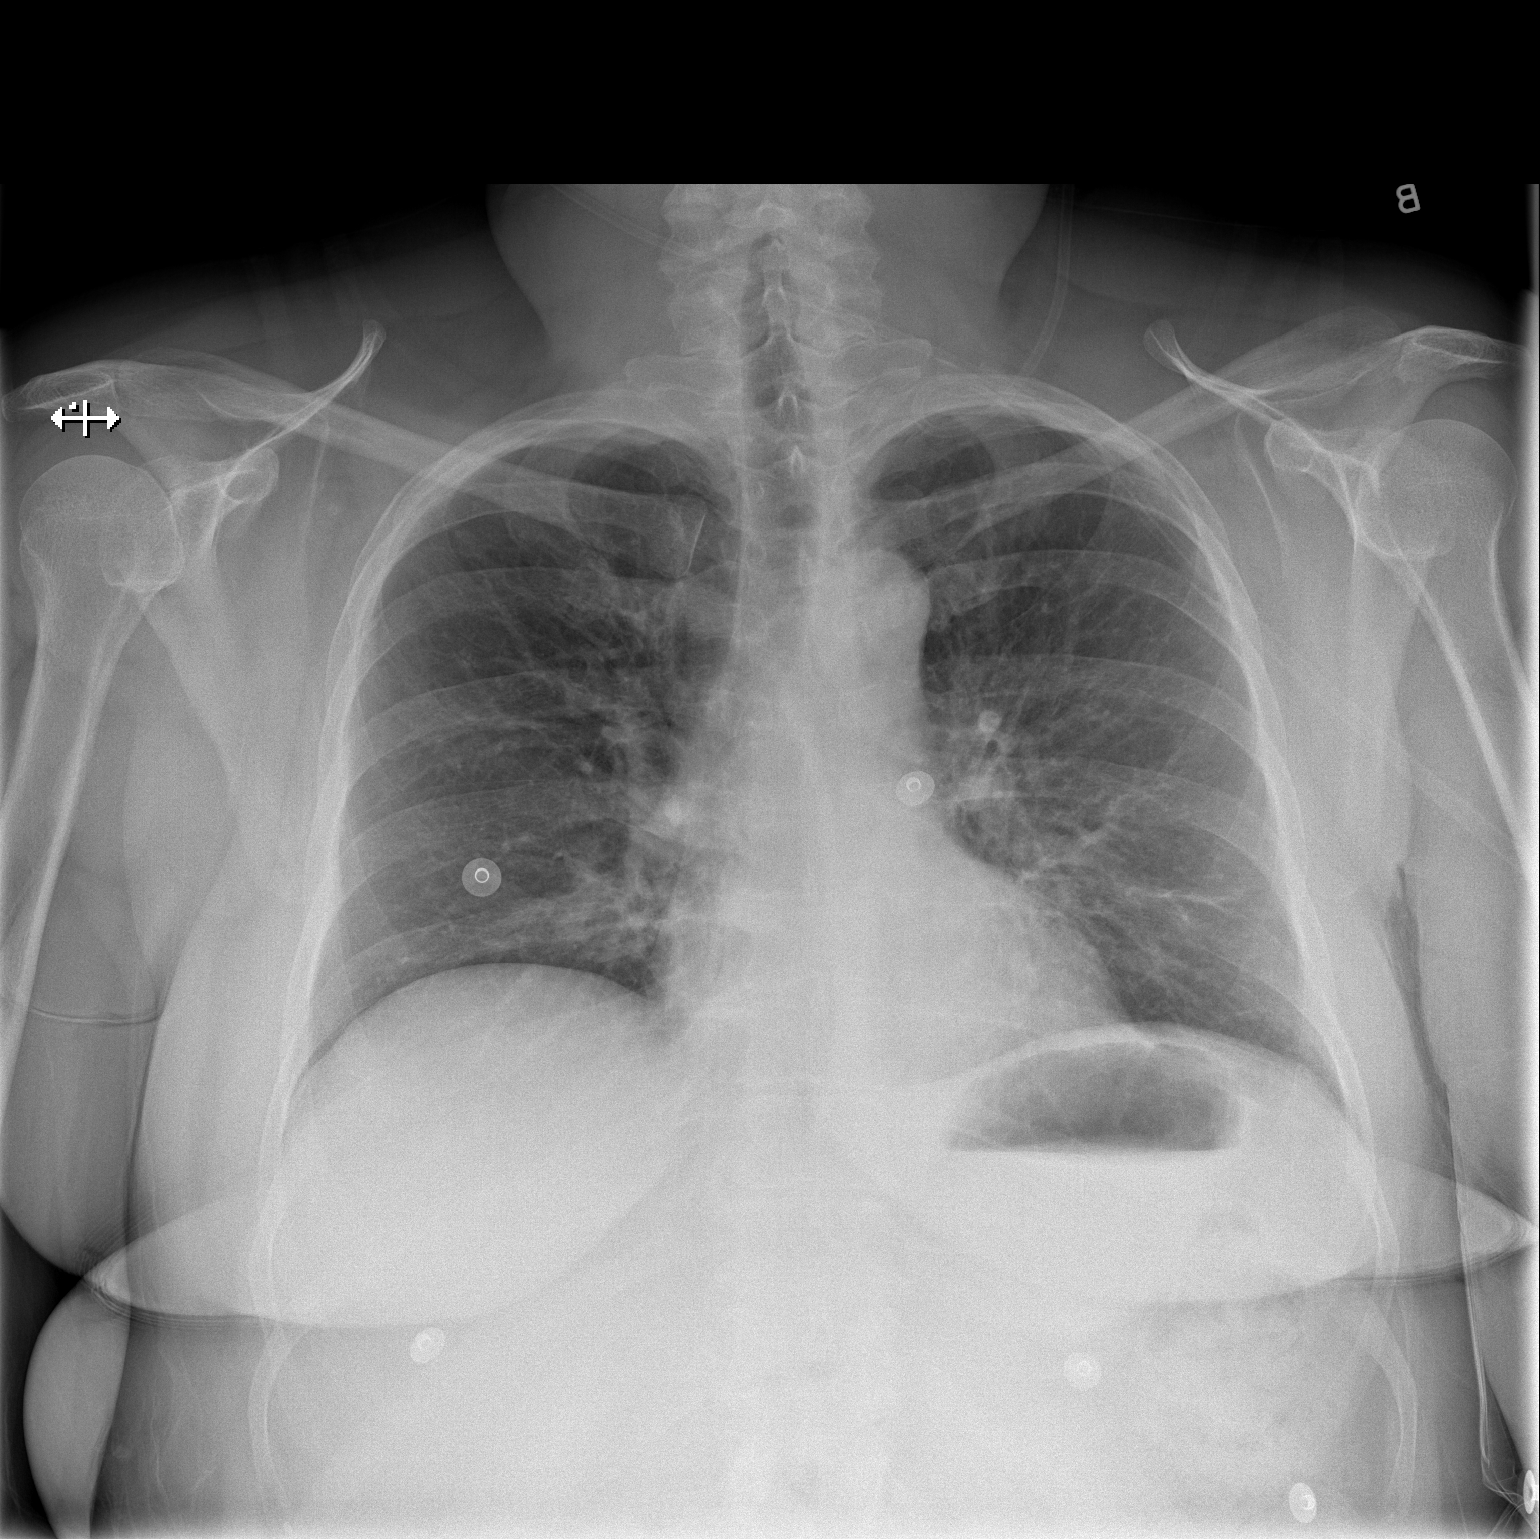

[w chest lat]
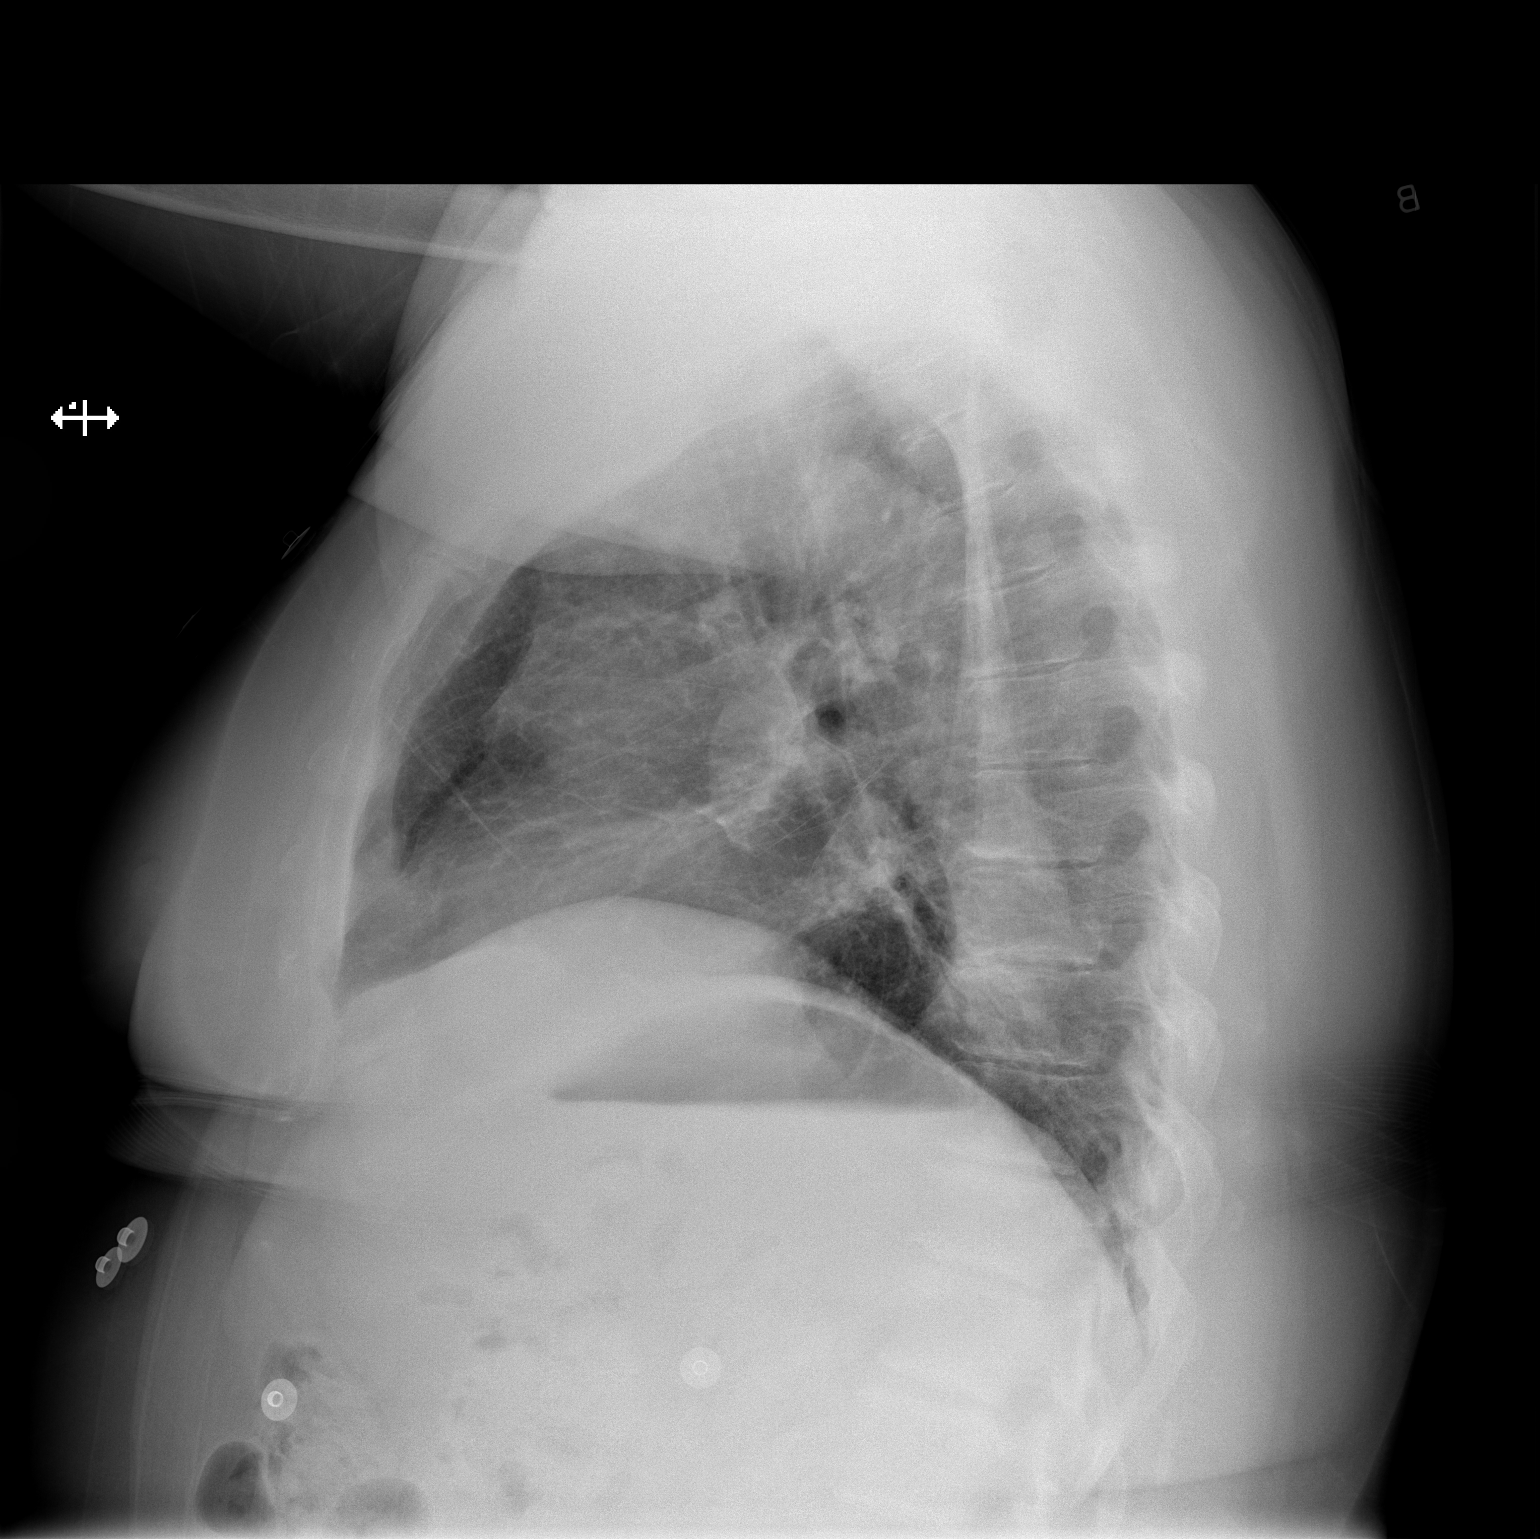

[2 of 2 positions shown; findings below may reference images not displayed]

FINDINGS: Trachea is midline.  Heart size normal, lungs are low in
volume with bilateral infrahilar air space disease.  No pleural
fluid.
IMPRESSION: Low lung volumes with mild bilateral infrahilar air space disease.

## 2014-12-01 ENCOUNTER — Emergency Department (HOSPITAL_COMMUNITY): Payer: BC Managed Care – PPO

## 2014-12-01 ENCOUNTER — Inpatient Hospital Stay (HOSPITAL_COMMUNITY)
Admission: EM | Admit: 2014-12-01 | Discharge: 2014-12-02 | DRG: 392 | Disposition: A | Discharge status: Home / Self Care | Payer: BC Managed Care – PPO | Attending: Internal Medicine | Admitting: Internal Medicine

## 2014-12-01 ENCOUNTER — Encounter (HOSPITAL_COMMUNITY): Payer: Self-pay | Admitting: Emergency Medicine

## 2014-12-01 DIAGNOSIS — Z888 Allergy status to other drugs, medicaments and biological substances status: Secondary | ICD-10-CM | POA: Diagnosis not present

## 2014-12-01 DIAGNOSIS — N949 Unspecified condition associated with female genital organs and menstrual cycle: Secondary | ICD-10-CM | POA: Diagnosis present

## 2014-12-01 DIAGNOSIS — Z7982 Long term (current) use of aspirin: Secondary | ICD-10-CM | POA: Diagnosis not present

## 2014-12-01 DIAGNOSIS — E785 Hyperlipidemia, unspecified: Secondary | ICD-10-CM | POA: Diagnosis present

## 2014-12-01 DIAGNOSIS — I2510 Atherosclerotic heart disease of native coronary artery without angina pectoris: Secondary | ICD-10-CM | POA: Diagnosis present

## 2014-12-01 DIAGNOSIS — R634 Abnormal weight loss: Secondary | ICD-10-CM | POA: Diagnosis present

## 2014-12-01 DIAGNOSIS — Z6831 Body mass index (BMI) 31.0-31.9, adult: Secondary | ICD-10-CM

## 2014-12-01 DIAGNOSIS — A09 Infectious gastroenteritis and colitis, unspecified: Principal | ICD-10-CM | POA: Diagnosis present

## 2014-12-01 DIAGNOSIS — E872 Acidosis: Secondary | ICD-10-CM | POA: Diagnosis present

## 2014-12-01 DIAGNOSIS — R7301 Impaired fasting glucose: Secondary | ICD-10-CM | POA: Diagnosis present

## 2014-12-01 DIAGNOSIS — M1612 Unilateral primary osteoarthritis, left hip: Secondary | ICD-10-CM | POA: Diagnosis present

## 2014-12-01 DIAGNOSIS — M1712 Unilateral primary osteoarthritis, left knee: Secondary | ICD-10-CM | POA: Diagnosis present

## 2014-12-01 DIAGNOSIS — K529 Noninfective gastroenteritis and colitis, unspecified: Secondary | ICD-10-CM

## 2014-12-01 DIAGNOSIS — R7302 Impaired glucose tolerance (oral): Secondary | ICD-10-CM

## 2014-12-01 DIAGNOSIS — R109 Unspecified abdominal pain: Secondary | ICD-10-CM | POA: Diagnosis not present

## 2014-12-01 DIAGNOSIS — N8320 Unspecified ovarian cysts: Secondary | ICD-10-CM | POA: Diagnosis present

## 2014-12-01 DIAGNOSIS — M1990 Unspecified osteoarthritis, unspecified site: Secondary | ICD-10-CM

## 2014-12-01 LAB — CBC WITH DIFFERENTIAL/PLATELET
Basophils Absolute: 0 10*3/uL (ref 0.0–0.1)
Basophils Relative: 0 % (ref 0–1)
Eosinophils Absolute: 0.2 10*3/uL (ref 0.0–0.7)
Eosinophils Relative: 2 % (ref 0–5)
HCT: 44.1 % (ref 36.0–46.0)
Hemoglobin: 15.1 g/dL — ABNORMAL HIGH (ref 12.0–15.0)
Lymphocytes Relative: 25 % (ref 12–46)
Lymphs Abs: 2.4 10*3/uL (ref 0.7–4.0)
MCH: 32 pg (ref 26.0–34.0)
MCHC: 34.2 g/dL (ref 30.0–36.0)
MCV: 93.4 fL (ref 78.0–100.0)
MONO ABS: 0.5 10*3/uL (ref 0.1–1.0)
Monocytes Relative: 5 % (ref 3–12)
NEUTROS ABS: 6.6 10*3/uL (ref 1.7–7.7)
Neutrophils Relative %: 68 % (ref 43–77)
PLATELETS: 228 10*3/uL (ref 150–400)
RBC: 4.72 MIL/uL (ref 3.87–5.11)
RDW: 13.5 % (ref 11.5–15.5)
WBC: 9.8 10*3/uL (ref 4.0–10.5)

## 2014-12-01 LAB — COMPREHENSIVE METABOLIC PANEL
ALT: 29 U/L (ref 0–35)
AST: 39 U/L — AB (ref 0–37)
Albumin: 3.9 g/dL (ref 3.5–5.2)
Alkaline Phosphatase: 82 U/L (ref 39–117)
Anion gap: 10 (ref 5–15)
BILIRUBIN TOTAL: 0.5 mg/dL (ref 0.3–1.2)
BUN: 15 mg/dL (ref 6–23)
CO2: 21 mmol/L (ref 19–32)
Calcium: 9.4 mg/dL (ref 8.4–10.5)
Chloride: 107 mmol/L (ref 96–112)
Creatinine, Ser: 0.86 mg/dL (ref 0.50–1.10)
GFR calc Af Amer: 82 mL/min — ABNORMAL LOW (ref >90)
GFR, EST NON AFRICAN AMERICAN: 71 mL/min — AB (ref >90)
GLUCOSE: 164 mg/dL — AB (ref 70–99)
Potassium: 4.1 mmol/L (ref 3.5–5.1)
Sodium: 138 mmol/L (ref 135–145)
TOTAL PROTEIN: 7.2 g/dL (ref 6.0–8.3)

## 2014-12-01 LAB — LIPID PANEL
Cholesterol: 187 mg/dL (ref 0–200)
HDL: 40 mg/dL (ref >39)
LDL CALC: 105 mg/dL — AB (ref 0–99)
Total CHOL/HDL Ratio: 4.7 RATIO
Triglycerides: 212 mg/dL — ABNORMAL HIGH (ref <150)
VLDL: 42 mg/dL — ABNORMAL HIGH (ref 0–40)

## 2014-12-01 LAB — HEPATITIS PANEL, ACUTE
HCV Ab: NEGATIVE
HEP B C IGM: NONREACTIVE
Hep A IgM: NONREACTIVE
Hepatitis B Surface Ag: NEGATIVE

## 2014-12-01 LAB — PROTIME-INR
INR: 0.99 (ref 0.00–1.49)
PROTHROMBIN TIME: 13.2 s (ref 11.6–15.2)

## 2014-12-01 LAB — URINE MICROSCOPIC-ADD ON

## 2014-12-01 LAB — URINALYSIS, ROUTINE W REFLEX MICROSCOPIC
BILIRUBIN URINE: NEGATIVE
Glucose, UA: NEGATIVE mg/dL
Ketones, ur: NEGATIVE mg/dL
Nitrite: NEGATIVE
Protein, ur: NEGATIVE mg/dL
SPECIFIC GRAVITY, URINE: 1.019 (ref 1.005–1.030)
Urobilinogen, UA: 0.2 mg/dL (ref 0.0–1.0)
pH: 5.5 (ref 5.0–8.0)

## 2014-12-01 LAB — I-STAT CG4 LACTIC ACID, ED: Lactic Acid, Venous: 2.77 mmol/L (ref 0.5–2.0)

## 2014-12-01 LAB — PHOSPHORUS: PHOSPHORUS: 2.9 mg/dL (ref 2.3–4.6)

## 2014-12-01 LAB — LIPASE, BLOOD: LIPASE: 32 U/L (ref 11–59)

## 2014-12-01 LAB — POC OCCULT BLOOD, ED: Fecal Occult Bld: NEGATIVE

## 2014-12-01 LAB — MAGNESIUM: Magnesium: 1.8 mg/dL (ref 1.5–2.5)

## 2014-12-01 LAB — LACTIC ACID, PLASMA: Lactic Acid, Venous: 1.4 mmol/L (ref 0.5–2.0)

## 2014-12-01 MED ORDER — SODIUM CHLORIDE 0.9 % IV SOLN: INTRAVENOUS | Status: DC

## 2014-12-01 MED ORDER — ONDANSETRON HCL 4 MG/2ML IJ SOLN: 4.0000 mg | Freq: Three times a day (TID) | INTRAMUSCULAR | Status: DC | PRN

## 2014-12-01 MED ORDER — SODIUM CHLORIDE 0.9 % IV BOLUS (SEPSIS)
1000.0000 mL | Freq: Once | INTRAVENOUS | Status: AC
  Administered 2014-12-01: 1000 mL via INTRAVENOUS

## 2014-12-01 MED ORDER — METRONIDAZOLE IN NACL 5-0.79 MG/ML-% IV SOLN
500.0000 mg | Freq: Two times a day (BID) | INTRAVENOUS | Status: DC
  Administered 2014-12-01: 500 mg via INTRAVENOUS
  Filled 2014-12-01 (×4): qty 100

## 2014-12-01 MED ORDER — SODIUM CHLORIDE 0.9 % IV SOLN
INTRAVENOUS | Status: DC
  Administered 2014-12-01 (×2): via INTRAVENOUS

## 2014-12-01 MED ORDER — MORPHINE SULFATE 2 MG/ML IJ SOLN
1.0000 mg | Freq: Four times a day (QID) | INTRAMUSCULAR | Status: DC | PRN
  Administered 2014-12-01 (×2): 1 mg via INTRAVENOUS
  Filled 2014-12-01 (×2): qty 1

## 2014-12-01 MED ORDER — IOHEXOL 300 MG/ML  SOLN
100.0000 mL | Freq: Once | INTRAMUSCULAR | Status: AC | PRN
  Administered 2014-12-01: 100 mL via INTRAVENOUS

## 2014-12-01 MED ORDER — METRONIDAZOLE IN NACL 5-0.79 MG/ML-% IV SOLN
500.0000 mg | Freq: Once | INTRAVENOUS | Status: AC
  Administered 2014-12-01: 500 mg via INTRAVENOUS
  Filled 2014-12-01: qty 100

## 2014-12-01 MED ORDER — CIPROFLOXACIN IN D5W 400 MG/200ML IV SOLN
400.0000 mg | Freq: Two times a day (BID) | INTRAVENOUS | Status: DC
  Administered 2014-12-01: 400 mg via INTRAVENOUS
  Filled 2014-12-01 (×3): qty 200

## 2014-12-01 MED ORDER — HYDROMORPHONE HCL 1 MG/ML IJ SOLN: 1.0000 mg | INTRAMUSCULAR | Status: DC | PRN

## 2014-12-01 MED ORDER — CIPROFLOXACIN IN D5W 400 MG/200ML IV SOLN
400.0000 mg | Freq: Once | INTRAVENOUS | Status: AC
  Administered 2014-12-01: 400 mg via INTRAVENOUS
  Filled 2014-12-01: qty 200

## 2014-12-01 MED ORDER — IOHEXOL 300 MG/ML  SOLN
25.0000 mL | Freq: Once | INTRAMUSCULAR | Status: AC | PRN
  Administered 2014-12-01: 25 mL via ORAL

## 2014-12-01 NOTE — Plan of Care (Signed)
Problem: Phase I Progression Outcomes Goal: Pain controlled with appropriate interventions Outcome: Progressing No stools or c/o pain this shift.

## 2014-12-01 NOTE — ED Notes (Signed)
Pt aware of need of urine specimen; ambulated pt to bathroom to attempt

## 2014-12-01 NOTE — H&P (Signed)
Date: 12/01/2014               Patient Name:  Carol Cox MRN: [id number]  DOB: Sep 09, 1951 Age / Sex: 64 y.o., female   PCP: Christain Sacramento, MD         Medical Service: Internal Medicine Teaching Service         Attending Physician: Dr. Thayer Headings, MD    First Contact: Primus Bravo, MS3 Dr. Albin Felling Pager: [phone]  Second Contact: Dr. Juluis Mire Pager: [phone]       After Hours (After 5p/  First Contact Pager: [phone]  weekends / holidays): Second Contact Pager: [phone]   Chief Complaint: "I started having stomach pain"  History of Present Illness: Carol Cox is a 64yo female with PMHx of HLD and mild CAD who presents with one day of abdominal pain and bloody diarrhea. The pain began in the middle of the day yesterday shortly after eating lunch (ham, carrots, and cheese). She describes the pain as diffuse, lower abdominal, sometimes stabbing. Episodes of pain last ~10 seconds and usually preceded the need to use the restroom. Pt reports >10 episodes of diarrhea; later BMs began to show bright red blood in toilet. No alleviating or aggravating factors. Of note, she takes care of 21-month-old grandchild a few days a week; he also attends daycare. Entire family had stomach bug three weeks ago with symptoms of nausea, vomiting, non-bloody diarrhea. Two weeks ago, she had some abdominal cramping/sharp pain and one episode of non-bloody diarrhea. Over the past 2-3 months (since knee surgery), she has had decreased appetite and 10lb weight loss. She denies recent travel, exposure to contaminated food/water, fever, chills, vomiting, or abdominal trauma. Last colonoscopy was in 2012. Revealed 4 benign polyps, removed. Follow-up was recommended for 10 years. No personal or family history of bowel disease or cancer.   In the ED, pt was given 2L NS bolus and empirically started on Cipro and Flagyl. Abdominal CT obtained with results below.   Meds: Current  Facility-Administered Medications  Medication Dose Route Frequency Provider Last Rate Last Dose  . ciprofloxacin (CIPRO) IVPB 400 mg  400 mg Intravenous Once Ernestina Patches, MD 200 mL/hr at 12/01/14 1138 400 mg at 12/01/14 1138   Current Outpatient Prescriptions  Medication Sig Dispense Refill  . aspirin 81 MG tablet Take 81 mg by mouth daily.      . cetirizine (ZYRTEC) 10 MG tablet Take 10 mg by mouth as needed for allergies.     . fish oil-omega-3 fatty acids 1000 MG capsule Take 1 g by mouth daily.      Marland Kitchen ibuprofen (ADVIL,MOTRIN) 200 MG tablet Take 200-600 mg by mouth every 6 (six) hours as needed for moderate pain.    Marland Kitchen METROGEL 1 % gel Apply 1 application topically daily.     . Multiple Vitamin (MULTIVITAMIN) tablet Take 1 tablet by mouth daily.      Marland Kitchen oxyCODONE-acetaminophen (PERCOCET) 10-325 MG per tablet Take 1 tablet by mouth every 4 (four) hours as needed for pain. 30 tablet 0  . Red Yeast Rice Extract (RED YEAST RICE PO) Take 1 tablet by mouth daily.    . nitroGLYCERIN (NITROSTAT) 0.4 MG SL tablet Place 1 tablet (0.4 mg total) under the tongue every 5 (five) minutes as needed for chest pain (up to 3 doses). (Patient not taking: Reported on 12/01/2014) 25 tablet 4  . VYTORIN 10-40 MG per tablet TAKE ONE TABLET BY MOUTH AT BEDTIME (Patient  not taking: Reported on 12/01/2014) 30 tablet 0    Allergies: Allergies as of 12/01/2014 - Review Complete 12/01/2014  Allergen Reaction Noted  . Crestor [rosuvastatin] Other (See Comments) 05/16/2013  . Lipitor [atorvastatin] Other (See Comments) 05/16/2013   Past Medical History  Diagnosis Date  . Hyperlipidemia   . Arthritis     "joints of left knee, left hip, ankles" (02/04/2013)  . CAD (coronary artery disease)     Mild nonobstructive CAD by cath 02/06/13 (25% mid LAD), normal EF   Past Surgical History  Procedure Laterality Date  . Carpal tunnel release Bilateral 1990's  . Left heart cath  02/06/13  . Left heart catheterization with  coronary angiogram N/A 02/06/2013    Procedure: LEFT HEART CATHETERIZATION WITH CORONARY ANGIOGRAM;  Surgeon: [name] Hampshire, MD;  Location: [place] CATH [facility];  Service: Cardiovascular;  Laterality: N/A;   Family History  Problem Relation Age of Onset  . Sudden death Father 23    Presumed heart attack  . CAD Sister 62  . Heart attack Sister 52  . CAD Maternal Uncle 36  . CAD Maternal Uncle 76  . Heart attack Sister 55  . Heart attack Maternal Uncle 42  . Heart attack Maternal Uncle 50  . CVA Mother    History   Social History  . Marital Status: Married    Spouse Name: N/A    Number of Children: 3  . Years of Education: N/A   Occupational History  . Retired     Pharmacist, hospital   Social History Main Topics  . Smoking status: Never Smoker   . Smokeless tobacco: Never Used  . Alcohol Use: No  . Drug Use: No  . Sexual Activity: Yes   Other Topics Concern  . Not on file   Social History Narrative   Lives at home with husband. 3 children. 6 grandchildren. Cooks at home for most meals, admittedly eats a lot of bread. Exercise somewhat decreased due to knee pain/meniscus surgery; rides exercise bike.       Review of Systems: Review of Systems as per HPI Constitutional: positive for weight loss and hot flashes, negative for chills and fevers Cardiovascular: negative for CP Pulmonary: negative for SOB Hematologic/lymphatic: negative for lymphadenopathy Musculoskeletal: positive for knee pain  Physical Exam: Blood pressure 129/68, pulse 73, temperature 97.6 F (36.4 C), temperature source Oral, resp. rate 18, height 5\' 5"  (1.651 m), weight 86.183 kg (190 lb), SpO2 96 %.  General: well-appearing, NAD, comfortably lying in bed. Appears stated age, alert, cooperative HEENT: normocephalic, atraumatic. EOMI, PERRL. Nares patent, no discharge. MMM, no oral ulcers. Neck supple, no lymphadenopathy CV: RRR, normal S1 S2, mo m/r/g. Radial and DP pulses 2+ Pulm: CTAB, normal work of breathing,  no wheezing Abd: soft, nondistended. Mild tenderness in LLQ. No guarding or rebound tenderness. No hepatosplenomegaly, no masses.  MSK: no edema. Strength 5/5 bilaterally Neuro: AOx3, no focal deficits  Lab results: CBC    Component Value Date/Time   WBC 9.8 12/01/2014 0610   RBC 4.72 12/01/2014 0610   HGB 15.1* 12/01/2014 0610   HCT 44.1 12/01/2014 0610   PLT 228 12/01/2014 0610   MCV 93.4 12/01/2014 0610   MCH 32.0 12/01/2014 0610   MCHC 34.2 12/01/2014 0610   RDW 13.5 12/01/2014 0610   LYMPHSABS 2.4 12/01/2014 0610   MONOABS 0.5 12/01/2014 0610   EOSABS 0.2 12/01/2014 0610   BASOSABS 0.0 12/01/2014 0610    CMP     Component Value Date/Time  NA 138 12/01/2014 0610   K 4.1 12/01/2014 0610   CL 107 12/01/2014 0610   CO2 21 12/01/2014 0610   GLUCOSE 164* 12/01/2014 0610   BUN 15 12/01/2014 0610   CREATININE 0.86 12/01/2014 0610   CALCIUM 9.4 12/01/2014 0610   PROT 7.2 12/01/2014 0610   ALBUMIN 3.9 12/01/2014 0610   AST 39* 12/01/2014 0610   ALT 29 12/01/2014 0610   ALKPHOS 82 12/01/2014 0610   BILITOT 0.5 12/01/2014 0610   GFRNONAA 71* 12/01/2014 0610   GFRAA 82* 12/01/2014 0610   Lipase     Component Value Date/Time   LIPASE 32 12/01/2014 0610   Urinalysis    Component Value Date/Time   COLORURINE YELLOW 12/01/2014 0801   APPEARANCEUR CLEAR 12/01/2014 0801   LABSPEC 1.019 12/01/2014 0801   PHURINE 5.5 12/01/2014 0801   GLUCOSEU NEGATIVE 12/01/2014 0801   HGBUR SMALL* 12/01/2014 0801   BILIRUBINUR NEGATIVE 12/01/2014 0801   KETONESUR NEGATIVE 12/01/2014 0801   PROTEINUR NEGATIVE 12/01/2014 0801   UROBILINOGEN 0.2 12/01/2014 0801   NITRITE NEGATIVE 12/01/2014 0801   LEUKOCYTESUR TRACE* 12/01/2014 0801   I-Stat CG4 Lactic Acid (12/01/14 0727): Lactic acid, venous: 2.77  POC occult blood (12/01/14 0726): Fecal Occult Bld: NEGATIVE   Imaging results:  Ct Abdomen Pelvis W Contrast  12/01/2014   CLINICAL DATA:  Lower abdominal pain.  Weight loss.   Bloody stools.  EXAM: CT ABDOMEN AND PELVIS WITH CONTRAST  TECHNIQUE: Multidetector CT imaging of the abdomen and pelvis was performed using the standard protocol following bolus administration of intravenous contrast.  CONTRAST:  157mL OMNIPAQUE IOHEXOL 300 MG/ML  SOLN  COMPARISON:  None.  FINDINGS: There is diffuse wall thickening involving the distal transverse colon (image 37, series 21) extending through the descending colon and sigmoid colon to the level of the rectum. The sigmoid colon is underdistended and redundant. There is a minimal amount of mesenteric stranding adjacent to the descending colon (representative image 48, series 201). No evidence of enteric obstruction. No pneumoperitoneum, pneumatosis or portal venous gas. The bowel is otherwise normal in course and caliber. Normal appearance of the terminal ileum. Normal appearance of the retrocecal appendix.  Normal hepatic contour. There is mild diffuse decreased attenuation of the hepatic parenchyma suggestive of hepatic steatosis. Normal appearance of the gallbladder given degree distention. No radiopaque gallstones. No intra extrahepatic biliary duct dilatation. No ascites.  There is symmetric enhancement and excretion of the bilateral kidneys. Note is made of a exophytic approximately 4.6 x 4.0 cm hypo attenuating (6 Hounsfield unit) cyst arising from the anterior interpolar aspect of left kidney (image 46, series 201). No definite right-sided renal lesions. No definite renal stones in this postcontrast examination. No urinary obstruction or perinephric stranding.  Normal appearance of the bilateral adrenal glands, pancreas and spleen.  Scattered minimal atherosclerotic plaque within a normal caliber abdominal aorta. The major branch vessels of the abdominal aorta appear widely patent on this non CTA examination. Scattered shotty retroperitoneal lymph nodes individually not enlarged by size criteria. No retroperitoneal, mesenteric, pelvic or  inguinal lymphadenopathy.  Note is made of a 3.9 x 2.9 cm hypo attenuating (7 Hounsfield unit) left-sided adnexal cyst (image 72, series 201). No discrete right-sided adnexal lesions. Normal appearance of the urinary bladder given degree distention. No free fluid in the pelvic cul-de-sac.  Limited visualization of the lower thorax demonstrates minimal subsegmental atelectasis within the inferior segment of the lingula as well as the medial basilar segment of the right lower lobe. No  discrete focal airspace opacities. No pleural effusion.  Normal heart size.  No pericardial effusion.  No acute or aggressive osseous abnormalities. Mild to moderate multilevel lumbar spine DDD, worse at L5-S1 with disc space height loss, endplate irregularity and sclerosis.  Regional soft tissues appear normal.  IMPRESSION: 1. Extensive colonic wall thickening extending from the distal transverse colon to the level of the rectum - nonspecific with differential considerations including infectious (favored), inflammatory and ischemic etiologies. No evidence of enteric obstruction or perforation. 2. Suspected hepatic steatosis. Correlation with LFTs is recommended. 3. Note is made of an approximately 3.9 cm left-sided adnexal cyst, an abnormal finding in this postmenopausal patient. As such, further evaluation with dedicated nonemergent pelvic ultrasound is recommended. This recommendation follows ACR consensus guidelines: White Paper of the ACR Incidental Findings Committee II on Adnexal Findings. J Am Coll Radiol [phone].   Electronically Signed   By: Sandi Mariscal M.D.   On: 12/01/2014 09:30     Assessment & Plan by Problem: Active Problems:   Acute Colitis   Hyperlipidemia   Adnexal Cyst  Mrs. Christian is a 64yo female with PMHx of HLD and mild CAD who presents with one day of abdominal pain and bloody diarrhea.   Colitis: Differential includes infectious colitis, ischemic colitis, and IBD. Infectious colitis is likely  given recent exposure to family members, bloody diarrhea, and colonic wall thickening on CT. Specifically, the infection could be from EHEC, Shigella, or of other bacterial or viral etiology. Pathogens are identified in 20% of cases of bloody diarrhea. EHEC is the cause of bloody diarrhea in 7.8% of bloody diarrhea cases, and presents w/o fever. Per UpToDate: [place], [place] (2015). Approach to the adult with acute diarrhea in resource-rich countries. In: UpToDate,  [place], [place] (Ed.),  UpToDate, [place], [place]. She is responding well to Cipro and Flagyl started in ED. Ischemic colitis possible given LLQ pain and diarrhea. However, no peritoneal signs or fevers. IBD possible given symptoms, but unlikely given no history of IBD.  -- GI pathogen panel and C. Diff screening. Adjust antibiotics accordingly -- continue Cipro and Flagyl -- 1mg  morphine IV PRN -- hold home 81mg  aspirin -- follow up fecal lactoferrin to help distinguish from inflammatory (bacterial) vs non-inflammatory (viral vs   Hyperlipidemia: on ezetimibe/simvasatin (Vytorin) 10-40mg  daily at home. Last lipid panel 11/24/2013: total cholesterol 153, triglycerides 145, HDL 43.40, LDL 81. -- hold Vytorin until on regular PO diet -- repeat lipid panel  Adnexal cyst: 3.9cm left sided adnexal cyst found on CT.  -- follow up with pelvic ultrasound as outpatient  FEN/GI: -- clear liquid diet. Advance as tolerated  DVT PPx: -- SCD  Dispo: Disposition is deferred at this time, awaiting improvement of current medical problems. Anticipated discharge in approximately 2 day(s).   The patient does have a current PCP Marylynn Pearson, MD) and does need an [facility] hospital follow-up appointment after discharge.  The patient does not have transportation limitations that hinder transportation to clinic appointments.  Signed: Louisa Second, Med Student 12/01/2014, 12:21 PM

## 2014-12-01 NOTE — H&P (Signed)
Date: 12/01/2014               Patient Name:  Carol Cox MRN: [id number]  DOB: August 27, 1951 Age / Sex: 64 y.o., female   PCP: Christain Sacramento, MD         Medical Service: Internal Medicine Teaching Service         Attending Physician: Dr. Thayer Headings, MD    First Contact: Primus Bravo Dr. Albin Felling Pager: [phone]  Second Contact: Dr. Juluis Mire Pager: [phone]       After Hours (After 5p/  First Contact Pager: [phone]  weekends / holidays): Second Contact Pager: [phone]   Chief Complaint: Abdominal pain, bloody diarrhea   History of Present Illness: Carol Cox is a 64yo woman w/ PMHx of CAD (cardiac cath in 2014, nonobstructive disease), HLD, and arthritis who presented to the ED with a 2 day history of lower abdominal pain and bloody diarrhea. Patient states yesterday afternoon she started to have "stomach pains" and this was shortly followed by diarrhea. She states at first her diarrhea did not contain blood but by the evening she started to notice bright red blood. She reports having diarrhea greater than 10 times over the course of the night. She describes her abdominal pain as located in the lower abdomen, particularly on the left side, and stabbing in nature. She states the abdominal pain does not last long and precedes an episode of diarrhea. She notes nothing makes it better or worse. She reports associated nausea, but denies fever, chills, and vomiting. She has not taken anything to help the pain. She denies excessive use of NSAIDs.   She notes about 3 weeks ago everyone in her family had a "stomach bug" with nausea/vomiting and non-bloody diarrhea. She states she also had the "bug" but this lasted only a few days. She states she also takes care of her 32 month-old grandchild a few times a week who attends daycare. She also reports about 2 weeks ago she had one episode of sharp abdominal pain that was followed by non-bloody diarrhea. She reports an  unintentional 10 lb weight loss in the last 2-3 months. She reports decreased appetite lately, but denies night sweats, tender or enlarged lymph nodes. She reports she last had a colonoscopy in 2012 that was normal.   In the ED she was given two 1 L NS boluses and started on Cipro and Flagyl.   Meds: Current Facility-Administered Medications  Medication Dose Route Frequency Provider Last Rate Last Dose  . ciprofloxacin (CIPRO) IVPB 400 mg  400 mg Intravenous Once Ernestina Patches, MD      . metroNIDAZOLE (FLAGYL) IVPB 500 mg  500 mg Intravenous Once Ernestina Patches, MD 100 mL/hr at 12/01/14 1017 500 mg at 12/01/14 1017   Current Outpatient Prescriptions  Medication Sig Dispense Refill  . aspirin 81 MG tablet Take 81 mg by mouth daily.      . cetirizine (ZYRTEC) 10 MG tablet Take 10 mg by mouth as needed for allergies.     . fish oil-omega-3 fatty acids 1000 MG capsule Take 1 g by mouth daily.      Marland Kitchen ibuprofen (ADVIL,MOTRIN) 200 MG tablet Take 200-600 mg by mouth every 6 (six) hours as needed for moderate pain.    Marland Kitchen METROGEL 1 % gel Apply 1 application topically daily.     . Multiple Vitamin (MULTIVITAMIN) tablet Take 1 tablet by mouth daily.      Marland Kitchen oxyCODONE-acetaminophen (PERCOCET) 10-325  MG per tablet Take 1 tablet by mouth every 4 (four) hours as needed for pain. 30 tablet 0  . Red Yeast Rice Extract (RED YEAST RICE PO) Take 1 tablet by mouth daily.    . nitroGLYCERIN (NITROSTAT) 0.4 MG SL tablet Place 1 tablet (0.4 mg total) under the tongue every 5 (five) minutes as needed for chest pain (up to 3 doses). (Patient not taking: Reported on 12/01/2014) 25 tablet 4  . VYTORIN 10-40 MG per tablet TAKE ONE TABLET BY MOUTH AT BEDTIME (Patient not taking: Reported on 12/01/2014) 30 tablet 0    Allergies: Allergies as of 12/01/2014 - Review Complete 12/01/2014  Allergen Reaction Noted  . Crestor [rosuvastatin] Other (See Comments) 05/16/2013  . Lipitor [atorvastatin] Other (See Comments) 05/16/2013     Past Medical History  Diagnosis Date  . Hyperlipidemia   . Arthritis     "joints of left knee, left hip, ankles" (02/04/2013)  . CAD (coronary artery disease)     Mild nonobstructive CAD by cath 02/06/13 (25% mid LAD), normal EF   Past Surgical History  Procedure Laterality Date  . Carpal tunnel release Bilateral 1990's  . Left heart cath  02/06/13  . Left heart catheterization with coronary angiogram N/A 02/06/2013    Procedure: LEFT HEART CATHETERIZATION WITH CORONARY ANGIOGRAM;  Surgeon: [name] Hampshire, MD;  Location: [facility];  Service: Cardiovascular;  Laterality: N/A;   Family History  Problem Relation Age of Onset  . Sudden death Father 72    Presumed heart attack  . CAD Sister 40  . CAD Maternal Uncle 15  . CAD Maternal Uncle 2  . Heart attack Sister 58  . Heart attack Maternal Uncle   . Heart attack Maternal Uncle    History   Social History  . Marital Status: Married    Spouse Name: N/A    Number of Children: 3  . Years of Education: N/A   Occupational History  . Retired     Pharmacist, hospital   Social History Main Topics  . Smoking status: Never Smoker   . Smokeless tobacco: Never Used  . Alcohol Use: No  . Drug Use: No  . Sexual Activity: Yes   Other Topics Concern  . Not on file   Social History Narrative   Lives at home with husband. 4 grandchildren.      Review of Systems: General: See HPI HEENT: Denies headaches, ear pain, changes in vision, rhinorrhea, sore throat CV: Denies CP, palpitations, SOB, orthopnea Pulm: Denies SOB, cough, wheezing GI: See HPI GU: Denies dysuria, hematuria, frequency Msk: Denies muscle cramps, joint pains Neuro: Denies weakness, numbness, tingling Skin: Denies rashes, bruising  Physical Exam: Blood pressure 136/71, pulse 88, temperature 97.6 F (36.4 C), temperature source Oral, resp. rate 16, height 5\' 5"  (1.651 m), weight 190 lb (86.183 kg), SpO2 98 %. General: alert, sitting up in bed, pleasant, NAD HEENT:  [place]/AT, EOMI, sclera anicteric, mucus membranes dry Neck: no cervical or supraclavicular lymphadenopathy CV: RRR, no m/g/r Pulm: CTA bilaterally, breaths non-labored Abd: BS+, soft, non-distended, mild tenderness to palpation in lower abdomen, no guarding or rebound  Ext: warm, no edema, moves all. No axillary lymphadenopathy.  Neuro: alert and oriented x 3, no focal deficits, strength 5/5 in upper and lower extremities bilaterally  Lab results: Basic Metabolic Panel:  Recent Labs  12/01/14 0610  NA 138  K 4.1  CL 107  CO2 21  GLUCOSE 164*  BUN 15  CREATININE 0.86  CALCIUM 9.4  Liver Function Tests:  Recent Labs  12/01/14 0610  AST 39*  ALT 29  ALKPHOS 82  BILITOT 0.5  PROT 7.2  ALBUMIN 3.9    Recent Labs  12/01/14 0610  LIPASE 32   CBC:  Recent Labs  12/01/14 0610  WBC 9.8  NEUTROABS 6.6  HGB 15.1*  HCT 44.1  MCV 93.4  PLT 228   Urinalysis:  Recent Labs  12/01/14 0801  COLORURINE YELLOW  LABSPEC 1.019  PHURINE 5.5  GLUCOSEU NEGATIVE  HGBUR SMALL*  BILIRUBINUR NEGATIVE  KETONESUR NEGATIVE  PROTEINUR NEGATIVE  UROBILINOGEN 0.2  NITRITE NEGATIVE  LEUKOCYTESUR TRACE*    Imaging results:  Ct Abdomen Pelvis W Contrast  12/01/2014   CLINICAL DATA:  Lower abdominal pain.  Weight loss.  Bloody stools.  EXAM: CT ABDOMEN AND PELVIS WITH CONTRAST  TECHNIQUE: Multidetector CT imaging of the abdomen and pelvis was performed using the standard protocol following bolus administration of intravenous contrast.  CONTRAST:  168mL OMNIPAQUE IOHEXOL 300 MG/ML  SOLN  COMPARISON:  None.  FINDINGS: There is diffuse wall thickening involving the distal transverse colon (image 37, series 21) extending through the descending colon and sigmoid colon to the level of the rectum. The sigmoid colon is underdistended and redundant. There is a minimal amount of mesenteric stranding adjacent to the descending colon (representative image 48, series 201). No evidence of enteric  obstruction. No pneumoperitoneum, pneumatosis or portal venous gas. The bowel is otherwise normal in course and caliber. Normal appearance of the terminal ileum. Normal appearance of the retrocecal appendix.  Normal hepatic contour. There is mild diffuse decreased attenuation of the hepatic parenchyma suggestive of hepatic steatosis. Normal appearance of the gallbladder given degree distention. No radiopaque gallstones. No intra extrahepatic biliary duct dilatation. No ascites.  There is symmetric enhancement and excretion of the bilateral kidneys. Note is made of a exophytic approximately 4.6 x 4.0 cm hypo attenuating (6 Hounsfield unit) cyst arising from the anterior interpolar aspect of left kidney (image 46, series 201). No definite right-sided renal lesions. No definite renal stones in this postcontrast examination. No urinary obstruction or perinephric stranding.  Normal appearance of the bilateral adrenal glands, pancreas and spleen.  Scattered minimal atherosclerotic plaque within a normal caliber abdominal aorta. The major branch vessels of the abdominal aorta appear widely patent on this non CTA examination. Scattered shotty retroperitoneal lymph nodes individually not enlarged by size criteria. No retroperitoneal, mesenteric, pelvic or inguinal lymphadenopathy.  Note is made of a 3.9 x 2.9 cm hypo attenuating (7 Hounsfield unit) left-sided adnexal cyst (image 72, series 201). No discrete right-sided adnexal lesions. Normal appearance of the urinary bladder given degree distention. No free fluid in the pelvic cul-de-sac.  Limited visualization of the lower thorax demonstrates minimal subsegmental atelectasis within the inferior segment of the lingula as well as the medial basilar segment of the right lower lobe. No discrete focal airspace opacities. No pleural effusion.  Normal heart size.  No pericardial effusion.  No acute or aggressive osseous abnormalities. Mild to moderate multilevel lumbar spine DDD,  worse at L5-S1 with disc space height loss, endplate irregularity and sclerosis.  Regional soft tissues appear normal.  IMPRESSION: 1. Extensive colonic wall thickening extending from the distal transverse colon to the level of the rectum - nonspecific with differential considerations including infectious (favored), inflammatory and ischemic etiologies. No evidence of enteric obstruction or perforation. 2. Suspected hepatic steatosis. Correlation with LFTs is recommended. 3. Note is made of an approximately 3.9 cm left-sided adnexal  cyst, an abnormal finding in this postmenopausal patient. As such, further evaluation with dedicated nonemergent pelvic ultrasound is recommended. This recommendation follows ACR consensus guidelines: White Paper of the ACR Incidental Findings Committee II on Adnexal Findings. J Am Coll Radiol [phone].   Electronically Signed   By: Sandi Mariscal M.D.   On: 12/01/2014 09:30    Assessment & Plan by Problem: Principal Problem:   Colitis, acute Active Problems:   Hyperlipidemia   Weight loss, unintentional   Adnexal cyst   Elevated fasting glucose   CAD (coronary artery disease)   Osteoarthritis  Likely Infectious Colitis: Patient presented with a 2 day history of lower abdominal pain and diffuse bloody diarrhea. Hbg stable at 15.1 (likely hemoconcentrated). FOBT negative. CT Abdomen/Pelvis showed evidence of extensive colonic wall thickening extending from the distal transverse colon to level of rectum, likely infectious/inflammatory (most likely) vs. Ischemic colitis. WBC count normal at 9.8, afebrile. Lactic acid is elevated at 2.77. Differential includes campylobacter, shigella, C. Diff, IBD, viral, enterohemorrhagic E coli (less likely). Will work up. - Continue Cipro 2/6>> and Flagyl 2/6>> - IVF @ 125 - Morphine 1 mg IV Q6H PRN abd pain - Clears as tolerated  - Check GI pathogen panel - Check C. Diff - Check fecal lactoferrin - Check hepatitis panel - Check  stool culture  - Repeat lactic acid  Unintentional Weight Loss: Patient notes 10 lb weight loss over last 2-3 months. She states she is surprised that she has lost weight since she has not been able to exercise as much due to her left knee pain. She notes decreased appetite, but denies fevers, night sweats, lymphadenopathy. She had a colonoscopy in 2012 with 4 polyps removed that were non-cancerous. Denies any family history of cancer. No smoking history. Unclear etiology of weight loss. Further work up warranted as outpatient, including pap smear and evaluation of left adnexal cyst (see below).  - Check HIV - Will need outpatient follow up  Left Adnexal Cyst: A 3.9 cm left-sided adnexal cyst was noted on CT Abd/Pelvis. Pelvic ultrasound recommended. - Set up as outpatient for further evaluation   Elevated Glucose: Glucose 164 on admission. Denies any history of diabetes or family history.  - Check HbA1c - Monitor glucose on bmets, consider adding ISS if glucose >180   CAD: Patient with hx of cardiac cath in April 2014 which showed mild nonobstructive disease (25% mid LAD) and a normal EF 65-70%. She takes ASA 81 mg daily and Vytorin at home. - Hold home meds given colitis   Hyperlipidemia: Last lipid profile in Jan 2015 showed Chol 153, Trigly 145, HDL 43, LDL 81. Patient takes Vytorin 10-40 mg QHS and fish oil at home. She reports muscle cramps with statins in the past. - Recheck lipid profile - Hold home meds for now given colitis   Arthritis: Patient reports arthritis in her left knee and left hip. She takes Ibuprofen and Percocet 10-325 mg as needed for pain at home.  - Hold home meds given colitis  - Morphine 1 mg Q6H PRN for pain as above  Diet: Clears  VTE PPx: SCDs Dispo: Disposition is deferred at this time, awaiting improvement of current medical problems.   The patient does have a current PCP Christain Sacramento, MD) and does need an [facility] hospital follow-up appointment after  discharge.  The patient does not have transportation limitations that hinder transportation to clinic appointments.  Signed: Albin Felling, MD 12/01/2014, 10:35 AM

## 2014-12-01 NOTE — ED Provider Notes (Signed)
CSN: [id number]     Arrival date & time 12/01/14  0631 History   First MD Initiated Contact with Patient 12/01/14 [phone]     Chief Complaint  Patient presents with  . Abdominal Pain     (Consider location/radiation/quality/duration/timing/severity/associated sxs/prior Treatment) Patient is a 64 y.o. female presenting with abdominal pain. The history is provided by the patient. No language interpreter was used.  Abdominal Pain Pain location:  LLQ and RLQ Pain quality: burning and sharp   Pain radiates to:  Does not radiate Pain severity:  Severe Onset quality:  Sudden Duration: 10sec. Timing:  Intermittent Progression:  Waxing and waning Chronicity:  New Relieved by:  Nothing Worsened by:  Nothing tried Ineffective treatments:  None tried Associated symptoms: diarrhea and nausea   Associated symptoms: no chest pain, no chills, no cough, no dysuria, no fatigue, no fever, no shortness of breath, no sore throat and no vomiting   Diarrhea:    Quality:  Blood-tinged   Number of occurrences:  3-4   Severity:  Moderate   Duration:  5 hours   Timing:  Intermittent   Progression:  Unchanged Risk factors: being elderly   Risk factors: no alcohol abuse, no aspirin use, has not had multiple surgeries, no NSAID use, not obese, not pregnant and no recent hospitalization     Past Medical History  Diagnosis Date  . Hyperlipidemia   . Arthritis     "joints of left knee, left hip, ankles" (02/04/2013)  . CAD (coronary artery disease)     Mild nonobstructive CAD by cath 02/06/13 (25% mid LAD), normal EF   Past Surgical History  Procedure Laterality Date  . Carpal tunnel release Bilateral 1990's  . Left heart cath  02/06/13  . Left heart catheterization with coronary angiogram N/A 02/06/2013    Procedure: LEFT HEART CATHETERIZATION WITH CORONARY ANGIOGRAM;  Surgeon: [name] Hampshire, MD;  Location: [facility];  Service: Cardiovascular;  Laterality: N/A;   Family History  Problem Relation  Age of Onset  . Sudden death Father 66    Presumed heart attack  . CAD Sister 31  . CAD Maternal Uncle 76  . CAD Maternal Uncle 57  . Heart attack Sister 26  . Heart attack Maternal Uncle   . Heart attack Maternal Uncle    History  Substance Use Topics  . Smoking status: Never Smoker   . Smokeless tobacco: Never Used  . Alcohol Use: No   OB History    No data available     Review of Systems  Constitutional: Negative for fever, chills, diaphoresis, activity change, appetite change and fatigue.  HENT: Negative for congestion, facial swelling, rhinorrhea and sore throat.   Eyes: Negative for photophobia and discharge.  Respiratory: Negative for cough, chest tightness and shortness of breath.   Cardiovascular: Negative for chest pain, palpitations and leg swelling.  Gastrointestinal: Positive for nausea, abdominal pain, diarrhea and blood in stool. Negative for vomiting.  Endocrine: Negative for polydipsia and polyuria.  Genitourinary: Negative for dysuria, frequency, difficulty urinating and pelvic pain.  Musculoskeletal: Negative for back pain, arthralgias, neck pain and neck stiffness.  Skin: Negative for color change and wound.  Allergic/Immunologic: Negative for immunocompromised state.  Neurological: Negative for facial asymmetry, weakness, numbness and headaches.  Hematological: Does not bruise/bleed easily.  Psychiatric/Behavioral: Negative for confusion and agitation.      Allergies  Crestor and Lipitor  Home Medications   Prior to Admission medications   Medication Sig Start Date End Date  Taking? Authorizing Provider  aspirin 81 MG tablet Take 81 mg by mouth daily.     Yes Historical Provider, MD  cetirizine (ZYRTEC) 10 MG tablet Take 10 mg by mouth as needed for allergies.    Yes Historical Provider, MD  fish oil-omega-3 fatty acids 1000 MG capsule Take 1 g by mouth daily.     Yes Historical Provider, MD  ibuprofen (ADVIL,MOTRIN) 200 MG tablet Take 200-600 mg  by mouth every 6 (six) hours as needed for moderate pain.   Yes Historical Provider, MD  METROGEL 1 % gel Apply 1 application topically daily.  07/22/11  Yes Historical Provider, MD  Multiple Vitamin (MULTIVITAMIN) tablet Take 1 tablet by mouth daily.     Yes Historical Provider, MD  oxyCODONE-acetaminophen (PERCOCET) 10-325 MG per tablet Take 1 tablet by mouth every 4 (four) hours as needed for pain. 07/14/14  Yes Hannah Muthersbaugh, PA-C  Red Yeast Rice Extract (RED YEAST RICE PO) Take 1 tablet by mouth daily.   Yes Historical Provider, MD  nitroGLYCERIN (NITROSTAT) 0.4 MG SL tablet Place 1 tablet (0.4 mg total) under the tongue every 5 (five) minutes as needed for chest pain (up to 3 doses). Patient not taking: Reported on 12/01/2014 02/06/13   Dayna N Dunn, PA-C  VYTORIN 10-40 MG per tablet TAKE ONE TABLET BY MOUTH AT BEDTIME Patient not taking: Reported on 12/01/2014 11/23/14   Larey Dresser, MD   BP 136/71 mmHg  Pulse 88  Temp(Src) 97.6 F (36.4 C) (Oral)  Resp 16  Ht 5\' 5"  (1.651 m)  Wt 190 lb (86.183 kg)  BMI 31.62 kg/m2  SpO2 98% Physical Exam  Constitutional: She is oriented to person, place, and time. She appears well-developed and well-nourished. No distress.  HENT:  Head: Normocephalic and atraumatic.  Mouth/Throat: No oropharyngeal exudate.  Eyes: Pupils are equal, round, and reactive to light.  Neck: Normal range of motion. Neck supple.  Cardiovascular: Normal rate, regular rhythm and normal heart sounds.  Exam reveals no gallop and no friction rub.   No murmur heard. Pulmonary/Chest: Effort normal and breath sounds normal. No respiratory distress. She has no wheezes. She has no rales.  Abdominal: Soft. Bowel sounds are normal. She exhibits no distension and no mass. There is generalized tenderness. There is no rebound and no guarding.  Musculoskeletal: Normal range of motion. She exhibits no edema or tenderness.  Neurological: She is alert and oriented to person, place, and  time.  Skin: Skin is warm and dry.  Psychiatric: She has a normal mood and affect.    ED Course  Procedures (including critical care time) Labs Review Labs Reviewed  CBC WITH DIFFERENTIAL/PLATELET - Abnormal; Notable for the following:    Hemoglobin 15.1 (*)    All other components within normal limits  COMPREHENSIVE METABOLIC PANEL - Abnormal; Notable for the following:    Glucose, Bld 164 (*)    AST 39 (*)    GFR calc non Af Amer 71 (*)    GFR calc Af Amer 82 (*)    All other components within normal limits  URINALYSIS, ROUTINE W REFLEX MICROSCOPIC - Abnormal; Notable for the following:    Hgb urine dipstick SMALL (*)    Leukocytes, UA TRACE (*)    All other components within normal limits  I-STAT CG4 LACTIC ACID, ED - Abnormal; Notable for the following:    Lactic Acid, Venous 2.77 (*)    All other components within normal limits  URINE CULTURE  CLOSTRIDIUM DIFFICILE  BY PCR  LIPASE, BLOOD  URINE MICROSCOPIC-ADD ON  POC OCCULT BLOOD, ED    Imaging Review Ct Abdomen Pelvis W Contrast  12/01/2014   CLINICAL DATA:  Lower abdominal pain.  Weight loss.  Bloody stools.  EXAM: CT ABDOMEN AND PELVIS WITH CONTRAST  TECHNIQUE: Multidetector CT imaging of the abdomen and pelvis was performed using the standard protocol following bolus administration of intravenous contrast.  CONTRAST:  181mL OMNIPAQUE IOHEXOL 300 MG/ML  SOLN  COMPARISON:  None.  FINDINGS: There is diffuse wall thickening involving the distal transverse colon (image 37, series 21) extending through the descending colon and sigmoid colon to the level of the rectum. The sigmoid colon is underdistended and redundant. There is a minimal amount of mesenteric stranding adjacent to the descending colon (representative image 48, series 201). No evidence of enteric obstruction. No pneumoperitoneum, pneumatosis or portal venous gas. The bowel is otherwise normal in course and caliber. Normal appearance of the terminal ileum. Normal  appearance of the retrocecal appendix.  Normal hepatic contour. There is mild diffuse decreased attenuation of the hepatic parenchyma suggestive of hepatic steatosis. Normal appearance of the gallbladder given degree distention. No radiopaque gallstones. No intra extrahepatic biliary duct dilatation. No ascites.  There is symmetric enhancement and excretion of the bilateral kidneys. Note is made of a exophytic approximately 4.6 x 4.0 cm hypo attenuating (6 Hounsfield unit) cyst arising from the anterior interpolar aspect of left kidney (image 46, series 201). No definite right-sided renal lesions. No definite renal stones in this postcontrast examination. No urinary obstruction or perinephric stranding.  Normal appearance of the bilateral adrenal glands, pancreas and spleen.  Scattered minimal atherosclerotic plaque within a normal caliber abdominal aorta. The major branch vessels of the abdominal aorta appear widely patent on this non CTA examination. Scattered shotty retroperitoneal lymph nodes individually not enlarged by size criteria. No retroperitoneal, mesenteric, pelvic or inguinal lymphadenopathy.  Note is made of a 3.9 x 2.9 cm hypo attenuating (7 Hounsfield unit) left-sided adnexal cyst (image 72, series 201). No discrete right-sided adnexal lesions. Normal appearance of the urinary bladder given degree distention. No free fluid in the pelvic cul-de-sac.  Limited visualization of the lower thorax demonstrates minimal subsegmental atelectasis within the inferior segment of the lingula as well as the medial basilar segment of the right lower lobe. No discrete focal airspace opacities. No pleural effusion.  Normal heart size.  No pericardial effusion.  No acute or aggressive osseous abnormalities. Mild to moderate multilevel lumbar spine DDD, worse at L5-S1 with disc space height loss, endplate irregularity and sclerosis.  Regional soft tissues appear normal.  IMPRESSION: 1. Extensive colonic wall thickening  extending from the distal transverse colon to the level of the rectum - nonspecific with differential considerations including infectious (favored), inflammatory and ischemic etiologies. No evidence of enteric obstruction or perforation. 2. Suspected hepatic steatosis. Correlation with LFTs is recommended. 3. Note is made of an approximately 3.9 cm left-sided adnexal cyst, an abnormal finding in this postmenopausal patient. As such, further evaluation with dedicated nonemergent pelvic ultrasound is recommended. This recommendation follows ACR consensus guidelines: White Paper of the ACR Incidental Findings Committee II on Adnexal Findings. J Am Coll Radiol [phone].   Electronically Signed   By: Sandi Mariscal M.D.   On: 12/01/2014 09:30     EKG Interpretation None      MDM   Final diagnoses:  Colitis    Pt is a 64 y.o. female with Pmhx as above who presents with low  ab pain since 4pm yesterday, burning/sharp intermittent low abdominal pain lasting about 10 seconds at a time, occurring several times an hour.  She is also had associated diarrhea since yesterday morning.  She states that she has had approximately 10 episodes 3-4 of which have had bright red blood mixed in with stool.  She has had nausea but no vomiting, no fevers, no chills, no dysuria.  She states that she has had a nonintentional 10 pound weight loss recently.  She has a history of 2 normal colonoscopies.  CT abdomen pelvis with an extensive colitis from transverse colon to rectum.  Suspect infectious colitis.  Lactate elevated 2.77.  She also has a incidental left-sided adnexal cyst, which will need follow-up ultrasound.  4.  Given age, elevated lactate and the extensive nature of the colitis.  I feel she should be admitted.  Her tachycardia has resolved with IV fluids.  I spoke with the internal medicine service who will admit her for further treatment.  IV Cipro and Flagyl have been started.    Ernestina Patches, MD 12/01/14  1032

## 2014-12-01 NOTE — ED Notes (Signed)
Pt arrives from home with c/o abdominal pain and diarrhea since 1600 yesterday. States pain is intermittent, states she can't walk when pain is present. States she noted blood in her diarrhea.

## 2014-12-01 NOTE — ED Notes (Signed)
Pt has finished drinking her contrast; CT has been made aware

## 2014-12-01 NOTE — ED Notes (Signed)
Pt transported to CT scan.

## 2014-12-01 NOTE — ED Notes (Signed)
Abnormal lactic acid given to Dr. Tawnya Crook

## 2014-12-01 NOTE — ED Notes (Signed)
Pt ambulating to the bathroom without any difficulty or distress; family at bedside

## 2014-12-01 NOTE — ED Notes (Signed)
ATTEMPTED TO CALL REPORT - RN TO CALL BACK.

## 2014-12-02 LAB — COMPREHENSIVE METABOLIC PANEL
ALK PHOS: 62 U/L (ref 39–117)
ALT: 25 U/L (ref 0–35)
AST: 29 U/L (ref 0–37)
Albumin: 3.1 g/dL — ABNORMAL LOW (ref 3.5–5.2)
Anion gap: 4 — ABNORMAL LOW (ref 5–15)
BUN: 6 mg/dL (ref 6–23)
CO2: 28 mmol/L (ref 19–32)
CREATININE: 0.89 mg/dL (ref 0.50–1.10)
Calcium: 8.5 mg/dL (ref 8.4–10.5)
Chloride: 112 mmol/L (ref 96–112)
GFR calc Af Amer: 79 mL/min — ABNORMAL LOW (ref >90)
GFR, EST NON AFRICAN AMERICAN: 68 mL/min — AB (ref >90)
Glucose, Bld: 102 mg/dL — ABNORMAL HIGH (ref 70–99)
POTASSIUM: 3.9 mmol/L (ref 3.5–5.1)
SODIUM: 144 mmol/L (ref 135–145)
Total Bilirubin: 0.5 mg/dL (ref 0.3–1.2)
Total Protein: 5.5 g/dL — ABNORMAL LOW (ref 6.0–8.3)

## 2014-12-02 LAB — CBC
HCT: 38.4 % (ref 36.0–46.0)
Hemoglobin: 12.7 g/dL (ref 12.0–15.0)
MCH: 31.4 pg (ref 26.0–34.0)
MCHC: 33.1 g/dL (ref 30.0–36.0)
MCV: 95 fL (ref 78.0–100.0)
Platelets: 203 10*3/uL (ref 150–400)
RBC: 4.04 MIL/uL (ref 3.87–5.11)
RDW: 13.8 % (ref 11.5–15.5)
WBC: 7.8 10*3/uL (ref 4.0–10.5)

## 2014-12-02 LAB — SEDIMENTATION RATE: SED RATE: 1 mm/h (ref 0–22)

## 2014-12-02 MED ORDER — CIPROFLOXACIN HCL 500 MG PO TABS: 500.0000 mg | ORAL_TABLET | Freq: Two times a day (BID) | ORAL | Status: DC

## 2014-12-02 MED ORDER — CIPROFLOXACIN HCL 500 MG PO TABS
500.0000 mg | ORAL_TABLET | Freq: Two times a day (BID) | ORAL | Status: DC
  Administered 2014-12-02: 500 mg via ORAL
  Filled 2014-12-02: qty 1

## 2014-12-02 NOTE — Progress Notes (Signed)
Subjective: Patient feeling much better this morning. Denies any diarrhea or abdominal pain. Tolerated clears well.   Objective: Vital signs in last 24 hours: Filed Vitals:   12/01/14 1322 12/01/14 1457 12/01/14 2254 12/02/14 0607  BP: 122/66 126/63 99/54 125/67  Pulse: 66 69 68 72  Temp:  98.8 F (37.1 C) 98.4 F (36.9 C) 98.1 F (36.7 C)  TempSrc:  Oral Oral Oral  Resp: 16 15 16 16   Height:  5\' 5"  (1.651 m)    Weight:  190 lb (86.183 kg)  201 lb 1.6 oz (91.218 kg)  SpO2: 99% 97% 97% 98%   Weight change: 0 lb (0 kg)  Intake/Output Summary (Last 24 hours) at 12/02/14 1014 Last data filed at 12/02/14 0626  Gross per 24 hour  Intake   1450 ml  Output      0 ml  Net   1450 ml   Physical Exam General: alert, sitting up in bed, pleasant, NAD HEENT: [place]/AT, EOMI, sclera anicteric, mucus membranes moist CV: RRR, no m/g/r Pulm: CTA bilaterally, breaths non-labored Abd: BS+, soft, non-distended, nontender Ext: warm, no edema, moves all Neuro: alert and oriented x 3, no focal deficits  Lab Results: Basic Metabolic Panel:  Recent Labs Lab 12/01/14 0610 12/01/14 1625 12/02/14 0451  NA 138  --  144  K 4.1  --  3.9  CL 107  --  112  CO2 21  --  28  GLUCOSE 164*  --  102*  BUN 15  --  6  CREATININE 0.86  --  0.89  CALCIUM 9.4  --  8.5  MG  --  1.8  --   PHOS  --  2.9  --    Liver Function Tests:  Recent Labs Lab 12/01/14 0610 12/02/14 0451  AST 39* 29  ALT 29 25  ALKPHOS 82 62  BILITOT 0.5 0.5  PROT 7.2 5.5*  ALBUMIN 3.9 3.1*    Recent Labs Lab 12/01/14 0610  LIPASE 32   CBC:  Recent Labs Lab 12/01/14 0610 12/02/14 0451  WBC 9.8 7.8  NEUTROABS 6.6  --   HGB 15.1* 12.7  HCT 44.1 38.4  MCV 93.4 95.0  PLT 228 203   Fasting Lipid Panel:  Recent Labs Lab 12/01/14 1625  CHOL 187  HDL 40  LDLCALC 105*  TRIG 212*  CHOLHDL 4.7   Coagulation:  Recent Labs Lab 12/01/14 1625  LABPROT 13.2  INR 0.99   Urinalysis:  Recent Labs Lab  12/01/14 0801  COLORURINE YELLOW  LABSPEC 1.019  PHURINE 5.5  GLUCOSEU NEGATIVE  HGBUR SMALL*  BILIRUBINUR NEGATIVE  KETONESUR NEGATIVE  PROTEINUR NEGATIVE  UROBILINOGEN 0.2  NITRITE NEGATIVE  LEUKOCYTESUR TRACE*   Studies/Results: Ct Abdomen Pelvis W Contrast  12/01/2014   CLINICAL DATA:  Lower abdominal pain.  Weight loss.  Bloody stools.  EXAM: CT ABDOMEN AND PELVIS WITH CONTRAST  TECHNIQUE: Multidetector CT imaging of the abdomen and pelvis was performed using the standard protocol following bolus administration of intravenous contrast.  CONTRAST:  131mL OMNIPAQUE IOHEXOL 300 MG/ML  SOLN  COMPARISON:  None.  FINDINGS: There is diffuse wall thickening involving the distal transverse colon (image 37, series 21) extending through the descending colon and sigmoid colon to the level of the rectum. The sigmoid colon is underdistended and redundant. There is a minimal amount of mesenteric stranding adjacent to the descending colon (representative image 48, series 201). No evidence of enteric obstruction. No pneumoperitoneum, pneumatosis or portal venous gas. The bowel  is otherwise normal in course and caliber. Normal appearance of the terminal ileum. Normal appearance of the retrocecal appendix.  Normal hepatic contour. There is mild diffuse decreased attenuation of the hepatic parenchyma suggestive of hepatic steatosis. Normal appearance of the gallbladder given degree distention. No radiopaque gallstones. No intra extrahepatic biliary duct dilatation. No ascites.  There is symmetric enhancement and excretion of the bilateral kidneys. Note is made of a exophytic approximately 4.6 x 4.0 cm hypo attenuating (6 Hounsfield unit) cyst arising from the anterior interpolar aspect of left kidney (image 46, series 201). No definite right-sided renal lesions. No definite renal stones in this postcontrast examination. No urinary obstruction or perinephric stranding.  Normal appearance of the bilateral adrenal  glands, pancreas and spleen.  Scattered minimal atherosclerotic plaque within a normal caliber abdominal aorta. The major branch vessels of the abdominal aorta appear widely patent on this non CTA examination. Scattered shotty retroperitoneal lymph nodes individually not enlarged by size criteria. No retroperitoneal, mesenteric, pelvic or inguinal lymphadenopathy.  Note is made of a 3.9 x 2.9 cm hypo attenuating (7 Hounsfield unit) left-sided adnexal cyst (image 72, series 201). No discrete right-sided adnexal lesions. Normal appearance of the urinary bladder given degree distention. No free fluid in the pelvic cul-de-sac.  Limited visualization of the lower thorax demonstrates minimal subsegmental atelectasis within the inferior segment of the lingula as well as the medial basilar segment of the right lower lobe. No discrete focal airspace opacities. No pleural effusion.  Normal heart size.  No pericardial effusion.  No acute or aggressive osseous abnormalities. Mild to moderate multilevel lumbar spine DDD, worse at L5-S1 with disc space height loss, endplate irregularity and sclerosis.  Regional soft tissues appear normal.  IMPRESSION: 1. Extensive colonic wall thickening extending from the distal transverse colon to the level of the rectum - nonspecific with differential considerations including infectious (favored), inflammatory and ischemic etiologies. No evidence of enteric obstruction or perforation. 2. Suspected hepatic steatosis. Correlation with LFTs is recommended. 3. Note is made of an approximately 3.9 cm left-sided adnexal cyst, an abnormal finding in this postmenopausal patient. As such, further evaluation with dedicated nonemergent pelvic ultrasound is recommended. This recommendation follows ACR consensus guidelines: White Paper of the ACR Incidental Findings Committee II on Adnexal Findings. J Am Coll Radiol [phone].   Electronically Signed   By: Sandi Mariscal M.D.   On: 12/01/2014 09:30    Medications: I have reviewed the patient's current medications. Scheduled Meds: . ciprofloxacin  500 mg Oral BID   Continuous Infusions:  PRN Meds:.morphine injection Assessment/Plan: Principal Problem:   Colitis, acute Active Problems:   Hyperlipidemia   Weight loss, unintentional   Adnexal cyst   Elevated fasting glucose   CAD (coronary artery disease)   Osteoarthritis  Acute Colitis, Likely Infectious: Diarrhea and abdominal pain have improved. C diff and GI pathogen panel canceled as not having symptoms any more. Mild lactic acidosis resolved. Will advance diet and discharge today with 2 additional days of Cipro.  - Change to PO Cipro 500 mg BID for additional 2 days - Morphine 1 mg IV Q6H PRN abd pain - Advance diet to regular   Unintentional Weight Loss:  - Check HIV>> pending  - Will need outpatient follow up for further work up  Left Adnexal Cyst: A 3.9 cm left-sided adnexal cyst was noted on CT Abd/Pelvis. Pelvic ultrasound recommended. - Set up as outpatient for further evaluation   Elevated Glucose: Glucose 164 on admission, 102 this AM. Denies any  history of diabetes or family history.  - Check HbA1c>> pending   CAD: Patient with hx of cardiac cath in April 2014 which showed mild nonobstructive disease (25% mid LAD) and a normal EF 65-70%. She takes ASA 81 mg daily and Vytorin at home. - Restart home medications   Hyperlipidemia: Last lipid profile in Jan 2015 showed Chol 153, Trigly 145, HDL 43, LDL 81. Patient takes Vytorin 10-40 mg QHS and fish oil at home. She reports muscle cramps with statins in the past. - Recheck lipid profile>> Chol 187, Trigly 212, HDL 40, LDL 105 - Resume home meds   Arthritis: Patient reports arthritis in her left knee and left hip. She takes Ibuprofen and Percocet 10-325 mg as needed for pain at home.  - Resume home pain meds upon discharge  - Morphine 1 mg Q6H PRN for pain as above  Diet: Regular  VTE PPx: SCDs Dispo:  Discharge today  The patient does have a current PCP Christain Sacramento, MD) and does need an [facility] hospital follow-up appointment after discharge.  The patient does not have transportation limitations that hinder transportation to clinic appointments.  .Services Needed at time of discharge: Y = Yes, Blank = No PT:   OT:   RN:   Equipment:   Other:     LOS: 1 day   Albin Felling, MD 12/02/2014, 10:14 AM

## 2014-12-02 NOTE — Progress Notes (Signed)
IV removed per order. Discharge instructions given and explained with teachback. Awaiting husband's arrival. Melford Aase, RN

## 2014-12-02 NOTE — Discharge Summary (Signed)
Name: Carol Cox MRN: [id number] DOB: [birth year] 64 y.o. PCP: Christain Sacramento, MD  Date of Admission: 12/01/2014  6:32 AM Date of Discharge: 12/02/2014 Attending Physician: Thayer Headings, MD  Discharge Diagnosis: Acute Infectious Colitis Unintentional Weight Loss Left Adnexal Cyst Elevated Fasting Glucose  CAD Hyperlipidemia Arthritis   Discharge Medications:   Medication List    TAKE these medications        aspirin 81 MG tablet  Take 81 mg by mouth daily.     cetirizine 10 MG tablet  Commonly known as:  ZYRTEC  Take 10 mg by mouth as needed for allergies.     ciprofloxacin 500 MG tablet  Commonly known as:  CIPRO  Take 1 tablet (500 mg total) by mouth 2 (two) times daily.     fish oil-omega-3 fatty acids 1000 MG capsule  Take 1 g by mouth daily.     ibuprofen 200 MG tablet  Commonly known as:  ADVIL,MOTRIN  Take 200-600 mg by mouth every 6 (six) hours as needed for moderate pain.     METROGEL 1 % gel  Generic drug:  metroNIDAZOLE  Apply 1 application topically daily.     multivitamin tablet  Take 1 tablet by mouth daily.     nitroGLYCERIN 0.4 MG SL tablet  Commonly known as:  NITROSTAT  Place 1 tablet (0.4 mg total) under the tongue every 5 (five) minutes as needed for chest pain (up to 3 doses).     oxyCODONE-acetaminophen 10-325 MG per tablet  Commonly known as:  PERCOCET  Take 1 tablet by mouth every 4 (four) hours as needed for pain.     RED YEAST RICE PO  Take 1 tablet by mouth daily.     VYTORIN 10-40 MG per tablet  Generic drug:  ezetimibe-simvastatin  TAKE ONE TABLET BY MOUTH AT BEDTIME        Disposition and follow-up:   Carol Cox was discharged from [facility] in Good condition.  At the hospital follow up visit please address:  1.  Unintentional weight loss- 10 lb weight loss over last 2-3 months.  Left adnexal cyst- found on CT Abd/Pelvis, warrants further eval with ultrasound   2.  Labs / imaging  needed at time of follow-up: Pelvic ultrasound   3.  Pending labs/ test needing follow-up: None   Procedures Performed:  Ct Abdomen Pelvis W Contrast  12/01/2014   CLINICAL DATA:  Lower abdominal pain.  Weight loss.  Bloody stools.  EXAM: CT ABDOMEN AND PELVIS WITH CONTRAST  TECHNIQUE: Multidetector CT imaging of the abdomen and pelvis was performed using the standard protocol following bolus administration of intravenous contrast.  CONTRAST:  166mL OMNIPAQUE IOHEXOL 300 MG/ML  SOLN  COMPARISON:  None.  FINDINGS: There is diffuse wall thickening involving the distal transverse colon (image 37, series 21) extending through the descending colon and sigmoid colon to the level of the rectum. The sigmoid colon is underdistended and redundant. There is a minimal amount of mesenteric stranding adjacent to the descending colon (representative image 48, series 201). No evidence of enteric obstruction. No pneumoperitoneum, pneumatosis or portal venous gas. The bowel is otherwise normal in course and caliber. Normal appearance of the terminal ileum. Normal appearance of the retrocecal appendix.  Normal hepatic contour. There is mild diffuse decreased attenuation of the hepatic parenchyma suggestive of hepatic steatosis. Normal appearance of the gallbladder given degree distention. No radiopaque gallstones. No intra extrahepatic biliary duct dilatation. No ascites.  There is symmetric enhancement and excretion of the bilateral kidneys. Note is made of a exophytic approximately 4.6 x 4.0 cm hypo attenuating (6 Hounsfield unit) cyst arising from the anterior interpolar aspect of left kidney (image 46, series 201). No definite right-sided renal lesions. No definite renal stones in this postcontrast examination. No urinary obstruction or perinephric stranding.  Normal appearance of the bilateral adrenal glands, pancreas and spleen.  Scattered minimal atherosclerotic plaque within a normal caliber abdominal aorta. The major  branch vessels of the abdominal aorta appear widely patent on this non CTA examination. Scattered shotty retroperitoneal lymph nodes individually not enlarged by size criteria. No retroperitoneal, mesenteric, pelvic or inguinal lymphadenopathy.  Note is made of a 3.9 x 2.9 cm hypo attenuating (7 Hounsfield unit) left-sided adnexal cyst (image 72, series 201). No discrete right-sided adnexal lesions. Normal appearance of the urinary bladder given degree distention. No free fluid in the pelvic cul-de-sac.  Limited visualization of the lower thorax demonstrates minimal subsegmental atelectasis within the inferior segment of the lingula as well as the medial basilar segment of the right lower lobe. No discrete focal airspace opacities. No pleural effusion.  Normal heart size.  No pericardial effusion.  No acute or aggressive osseous abnormalities. Mild to moderate multilevel lumbar spine DDD, worse at L5-S1 with disc space height loss, endplate irregularity and sclerosis.  Regional soft tissues appear normal.  IMPRESSION: 1. Extensive colonic wall thickening extending from the distal transverse colon to the level of the rectum - nonspecific with differential considerations including infectious (favored), inflammatory and ischemic etiologies. No evidence of enteric obstruction or perforation. 2. Suspected hepatic steatosis. Correlation with LFTs is recommended. 3. Note is made of an approximately 3.9 cm left-sided adnexal cyst, an abnormal finding in this postmenopausal patient. As such, further evaluation with dedicated nonemergent pelvic ultrasound is recommended. This recommendation follows ACR consensus guidelines: White Paper of the ACR Incidental Findings Committee II on Adnexal Findings. J Am Coll Radiol [phone].   Electronically Signed   By: Sandi Mariscal M.D.   On: 12/01/2014 09:30     Admission HPI: Ms. Bohle is a 64yo woman w/ PMHx of CAD (cardiac cath in 2014, nonobstructive disease), HLD, and  arthritis who presented to the ED with a 2 day history of lower abdominal pain and bloody diarrhea. Patient states yesterday afternoon she started to have "stomach pains" and this was shortly followed by diarrhea. She states at first her diarrhea did not contain blood but by the evening she started to notice bright red blood. She reports having diarrhea greater than 10 times over the course of the night. She describes her abdominal pain as located in the lower abdomen, particularly on the left side, and stabbing in nature. She states the abdominal pain does not last long and precedes an episode of diarrhea. She notes nothing makes it better or worse. She reports associated nausea, but denies fever, chills, and vomiting. She has not taken anything to help the pain. She denies excessive use of NSAIDs.   She notes about 3 weeks ago everyone in her family had a "stomach bug" with nausea/vomiting and non-bloody diarrhea. She states she also had the "bug" but this lasted only a few days. She states she also takes care of her 45 month-old grandchild a few times a week who attends daycare. She also reports about 2 weeks ago she had one episode of sharp abdominal pain that was followed by non-bloody diarrhea. She reports an unintentional 10 lb weight loss in  the last 2-3 months. She reports decreased appetite lately, but denies night sweats, tender or enlarged lymph nodes. She reports she last had a colonoscopy in 2012 that was normal.   In the ED she was given two 1 L NS boluses and started on Cipro and Flagyl.   Hospital Course by problem list:  Acute Infectious Colitis: Patient presented with a 2 day history of lower abdominal pain and diffuse bloody diarrhea. She noted to have sick contacts in the past few weeks with a similar "GI bug." CT Abd/Pelvis showed evidence of extensive colitis, which was determined to be infectious in nature. She was started on Cipro and Flagyl. Her abdominal pain and diarrhea improved  by the following day. She was discharged with Cipro 500 mg BID for an additional 2 days. She is to have follow up with her PCP in [place], [place].   Unintentional Weight Loss: On admission, patient noted a 10 lb unintentional weight loss over the last 2-3 months. She stated she was surprised that she has lost weight since she has not been able to exercise as much due to her left knee pain. She noted decreased appetite, but denied fevers, night sweats, lymphadenopathy. She had a colonoscopy in 2012 with 4 polyps removed that were non-cancerous. Denies any family history of cancer. No smoking history. Unclear etiology of weight loss. Her HIV was negative. Her unintentional weight loss should be addressed by her PCP.   Left Adnexal Cyst: A 3.9 cm left-sided adnexal cyst was noted on CT Abd/Pelvis. Pelvic ultrasound recommended. Patient to follow up with her PCP for further evaluation.   Elevated Fasting Glucose: Glucose 164 on admission. She denied any history of diabetes. Her HbA1c was 5.7. Patient's HbA1c should be monitored yearly for possible development of type 2 diabetes.   CAD: Patient with hx of cardiac cath in April 2014 which showed mild nonobstructive disease (25% mid LAD) and a normal EF 65-70%. She takes ASA 81 mg daily and Vytorin at home. Her home medications were held initially due to nausea and diarrhea, but then resumed the following day and upon discharge.   Hyperlipidemia: Patient takes Vytorin 10-40 mg QHS and fish oil at home. She reports muscle cramps with statins in the past. Repeat lipid profile shows Chol 187, Trigly 212, HDL 40, LDL 105. Patient was continued on her home medications.   Arthritis: Patient reported arthritis in her left knee and left hip. She takes Ibuprofen and Percocet 10-325 mg as needed for pain at home. These medications were held since patient was receiving morphine PRN for her abdominal pain related to colitis. She was resumed on her home meds upon discharge.    Discharge Vitals:   BP 125/67 mmHg  Pulse 72  Temp(Src) 98.1 F (36.7 C) (Oral)  Resp 16  Ht 5\' 5"  (1.651 m)  Wt 201 lb 1.6 oz (91.218 kg)  BMI 33.46 kg/m2  SpO2 98% Physical Exam General: alert, sitting up in bed, pleasant, NAD HEENT: [place]/AT, EOMI, sclera anicteric, mucus membranes moist CV: RRR, no m/g/r Pulm: CTA bilaterally, breaths non-labored Abd: BS+, soft, non-distended, nontender Ext: warm, no edema, moves all Neuro: alert and oriented x 3, no focal deficits  Discharge Labs:  Results for orders placed or performed during the hospital encounter of 12/01/14 (from the past 24 hour(s))  Hepatitis panel, acute     Status: None   Collection Time: 12/01/14  4:25 PM  Result Value Ref Range   Hepatitis B Surface Ag NEGATIVE NEGATIVE  HCV Ab NEGATIVE NEGATIVE   Hep A IgM NON REACTIVE NON REACTIVE   Hep B C IgM NON REACTIVE NON REACTIVE  Lipid panel     Status: Abnormal   Collection Time: 12/01/14  4:25 PM  Result Value Ref Range   Cholesterol 187 0 - 200 mg/dL   Triglycerides 212 (H) <150 mg/dL   HDL 40 >39 mg/dL   Total CHOL/HDL Ratio 4.7 RATIO   VLDL 42 (H) 0 - 40 mg/dL   LDL Cholesterol 105 (H) 0 - 99 mg/dL  Magnesium     Status: None   Collection Time: 12/01/14  4:25 PM  Result Value Ref Range   Magnesium 1.8 1.5 - 2.5 mg/dL  Protime-INR     Status: None   Collection Time: 12/01/14  4:25 PM  Result Value Ref Range   Prothrombin Time 13.2 11.6 - 15.2 seconds   INR 0.99 0.00 - 1.49  Phosphorus     Status: None   Collection Time: 12/01/14  4:25 PM  Result Value Ref Range   Phosphorus 2.9 2.3 - 4.6 mg/dL  Lactic acid, plasma     Status: None   Collection Time: 12/01/14  4:25 PM  Result Value Ref Range   Lactic Acid, Venous 1.4 0.5 - 2.0 mmol/L  Comprehensive metabolic panel     Status: Abnormal   Collection Time: 12/02/14  4:51 AM  Result Value Ref Range   Sodium 144 135 - 145 mmol/L   Potassium 3.9 3.5 - 5.1 mmol/L   Chloride 112 96 - 112 mmol/L   CO2  28 19 - 32 mmol/L   Glucose, Bld 102 (H) 70 - 99 mg/dL   BUN 6 6 - 23 mg/dL   Creatinine, Ser 0.89 0.50 - 1.10 mg/dL   Calcium 8.5 8.4 - 10.5 mg/dL   Total Protein 5.5 (L) 6.0 - 8.3 g/dL   Albumin 3.1 (L) 3.5 - 5.2 g/dL   AST 29 0 - 37 U/L   ALT 25 0 - 35 U/L   Alkaline Phosphatase 62 39 - 117 U/L   Total Bilirubin 0.5 0.3 - 1.2 mg/dL   GFR calc non Af Amer 68 (L) >90 mL/min   GFR calc Af Amer 79 (L) >90 mL/min   Anion gap 4 (L) 5 - 15  CBC     Status: None   Collection Time: 12/02/14  4:51 AM  Result Value Ref Range   WBC 7.8 4.0 - 10.5 K/uL   RBC 4.04 3.87 - 5.11 MIL/uL   Hemoglobin 12.7 12.0 - 15.0 g/dL   HCT 38.4 36.0 - 46.0 %   MCV 95.0 78.0 - 100.0 fL   MCH 31.4 26.0 - 34.0 pg   MCHC 33.1 30.0 - 36.0 g/dL   RDW 13.8 11.5 - 15.5 %   Platelets 203 150 - 400 K/uL  Sedimentation rate     Status: None   Collection Time: 12/02/14  4:51 AM  Result Value Ref Range   Sed Rate 1 0 - 22 mm/hr    Signed: Albin Felling, MD 12/02/2014, 12:16 PM    Services Ordered on Discharge: None Equipment Ordered on Discharge: None

## 2014-12-02 NOTE — Discharge Instructions (Signed)
It was a pleasure taking care of you, Ms. Carol Cox.  Please complete your course of Ciprofloxacin. Take 500 mg (1 pill) this evening and then the last 2 pills tomorrow, 12 hours apart.   You need to follow up with your primary care doctor in regards to your unintentional weight loss.   Take care, Dr. Arcelia Jew

## 2014-12-03 LAB — HEMOGLOBIN A1C
Hgb A1c MFr Bld: 5.7 % — ABNORMAL HIGH (ref 4.8–5.6)
MEAN PLASMA GLUCOSE: 117 mg/dL

## 2014-12-03 LAB — HIV ANTIBODY (ROUTINE TESTING W REFLEX): HIV Screen 4th Generation wRfx: NONREACTIVE

## 2014-12-04 LAB — URINE CULTURE

## 2015-02-18 ENCOUNTER — Other Ambulatory Visit: Payer: Self-pay | Admitting: Cardiology

## 2015-02-25 ENCOUNTER — Other Ambulatory Visit: Payer: Self-pay

## 2015-02-25 ENCOUNTER — Telehealth: Payer: Self-pay | Admitting: Cardiology

## 2015-02-25 DIAGNOSIS — E785 Hyperlipidemia, unspecified: Secondary | ICD-10-CM

## 2015-02-25 MED ORDER — EZETIMIBE-SIMVASTATIN 10-40 MG PO TABS: 1.0000 | ORAL_TABLET | Freq: Every day | ORAL | Status: DC

## 2015-02-25 NOTE — Telephone Encounter (Signed)
Pt advised, lab appt for lipid profile scheduled for 04/22/15.

## 2015-02-25 NOTE — Telephone Encounter (Signed)
LDL was higher than I would like it in 2/16.  Would repeat prior to her appt with Cecille Rubin.

## 2015-02-25 NOTE — Telephone Encounter (Signed)
Pt has an appt scheduled with Truitt Merle 04/24/15. Pt was hospitalized with colitis in February 2016 and had several labs done then including lipid profile.  Pt asking if she should have lipid profile or other lab prior to 04/24/15 appt with Cecille Rubin.   I have refilled Vytorin at pt's request.

## 2015-02-25 NOTE — Telephone Encounter (Signed)
New Message    Patient wants to get her cholesterol pill and needs to get her cholesterol check. She states she needs blood work. Please give patient a call.

## 2015-03-13 NOTE — H&P (Signed)
TOTAL KNEE ADMISSION H&P  Patient is being admitted for left total knee arthroplasty.  Subjective:  Chief Complaint:   Left knee primary OA / pain.  HPI: Carol Cox, 64 y.o. female, has a history of pain and functional disability in the left knee due to arthritis and has failed non-surgical conservative treatments for greater than 12 weeks to include NSAID's and/or analgesics, corticosteriod injections, viscosupplementation injections and activity modification.  Onset of symptoms was gradual, starting 2+ years ago with gradually worsening course since that time. The patient noted prior procedures on the knee to include  arthroscopy and menisectomy on the left knee in October 2015.  Patient currently rates pain in the left knee(s) at 10 out of 10 with activity. Patient has night pain, worsening of pain with activity and weight bearing, pain that interferes with activities of daily living, pain with passive range of motion, crepitus and joint swelling.  Patient has evidence of periarticular osteophytes and joint space narrowing by imaging studies.  Risks, benefits and expectations were discussed with the patient.  Risks including but not limited to the risk of anesthesia, blood clots, nerve damage, blood vessel damage, failure of the prosthesis, infection and up to and including death.  Patient understand the risks, benefits and expectations and wishes to proceed with surgery.   PCP: Woody Seller, MD  D/C Plans:      Home with HHPT  Post-op Meds:       No Rx given   Tranexamic Acid:      To be given - IV   Decadron:      Is to be given  FYI:     ASA post-op  Norco post-op    Patient Active Problem List   Diagnosis Date Noted  . Colitis, acute 12/01/2014  . Weight loss, unintentional 12/01/2014  . Adnexal cyst 12/01/2014  . Elevated fasting glucose 12/01/2014  . CAD (coronary artery disease) 12/01/2014  . Osteoarthritis 12/01/2014  . Vasovagal attack 02/28/2013  .  Hyperlipidemia 02/28/2013   Past Medical History  Diagnosis Date  . Hyperlipidemia   . Arthritis     "joints of left knee, left hip, ankles" (02/04/2013)  . CAD (coronary artery disease)     Mild nonobstructive CAD by cath 02/06/13 (25% mid LAD), normal EF    Past Surgical History  Procedure Laterality Date  . Carpal tunnel release Bilateral 1990's  . Left heart cath  02/06/13  . Left heart catheterization with coronary angiogram N/A 02/06/2013    Procedure: LEFT HEART CATHETERIZATION WITH CORONARY ANGIOGRAM;  Surgeon: [name] Hampshire, MD;  Location: [facility];  Service: Cardiovascular;  Laterality: N/A;    No prescriptions prior to admission   Allergies  Allergen Reactions  . Crestor [Rosuvastatin] Other (See Comments)    Leg pain  . Lipitor [Atorvastatin] Other (See Comments)    Leg pain    History  Substance Use Topics  . Smoking status: Never Smoker   . Smokeless tobacco: Never Used  . Alcohol Use: No    Family History  Problem Relation Age of Onset  . Sudden death Father 85    Presumed heart attack  . CAD Sister 54  . Heart attack Sister 14  . CAD Maternal Uncle 54  . CAD Maternal Uncle 76  . Heart attack Sister 14  . Heart attack Maternal Uncle 42  . Heart attack Maternal Uncle 50  . CVA Mother      Review of Systems  Constitutional: Negative.  HENT: Negative.   Eyes: Negative.   Respiratory: Negative.   Cardiovascular: Negative.   Gastrointestinal: Negative.   Genitourinary: Negative.   Musculoskeletal: Positive for joint pain.  Skin: Negative.   Neurological: Negative.   Endo/Heme/Allergies: Positive for environmental allergies.  Psychiatric/Behavioral: Negative.     Objective:  Physical Exam  Constitutional: She is oriented to person, place, and time. She appears well-developed and well-nourished.  HENT:  Head: Normocephalic and atraumatic.  Eyes: Pupils are equal, round, and reactive to light.  Neck: Neck supple. No JVD present. No  tracheal deviation present. No thyromegaly present.  Cardiovascular: Normal rate, regular rhythm, normal heart sounds and intact distal pulses.   Respiratory: Effort normal and breath sounds normal. No stridor. No respiratory distress. She has no wheezes.  GI: Soft. There is no tenderness. There is no guarding.  Musculoskeletal:       Left knee: She exhibits decreased range of motion, swelling and bony tenderness. She exhibits no ecchymosis, no deformity, no laceration and no erythema. Tenderness found.  Lymphadenopathy:    She has no cervical adenopathy.  Neurological: She is alert and oriented to person, place, and time.  Skin: Skin is warm and dry.  Psychiatric: She has a normal mood and affect.     Labs:  Estimated body mass index is 33.46 kg/(m^2) as calculated from the following:   Height as of 12/01/14: 5\' 5"  (1.651 m).   Weight as of 12/01/14: 91.218 kg (201 lb 1.6 oz).   Imaging Review Plain radiographs demonstrate moderate degenerative joint disease of the left knee(s). The overall alignment is neutral. The bone quality appears to be good for age and reported activity level.  Assessment/Plan:  End stage arthritis, left knee   The patient history, physical examination, clinical judgment of the provider and imaging studies are consistent with end stage degenerative joint disease of the left knee(s) and total knee arthroplasty is deemed medically necessary. The treatment options including medical management, injection therapy arthroscopy and arthroplasty were discussed at length. The risks and benefits of total knee arthroplasty were presented and reviewed. The risks due to aseptic loosening, infection, stiffness, patella tracking problems, thromboembolic complications and other imponderables were discussed. The patient acknowledged the explanation, agreed to proceed with the plan and consent was signed. Patient is being admitted for inpatient treatment for surgery, pain control, PT,  OT, prophylactic antibiotics, VTE prophylaxis, progressive ambulation and ADL's and discharge planning. The patient is planning to be discharged home with home health services.    West Pugh Darilyn Storbeck   PA-C  03/13/2015, 12:46 PM

## 2015-03-18 ENCOUNTER — Encounter (HOSPITAL_COMMUNITY): Payer: Self-pay

## 2015-03-18 NOTE — Patient Instructions (Signed)
Carol Cox  03/18/2015   Your procedure is scheduled on:    03/26/2015    Report to [facility] Main  Entrance and follow signs to               [name] at     0700 AM.  Call this number if you have problems the morning of surgery [phone]   Remember: ONLY 1 PERSON MAY GO WITH YOU TO SHORT STAY TO GET  READY MORNING OF [name].  Do not eat food or drink liquids :After Midnight.     Take these medicines the morning of surgery with A SIP OF WATER:   Zyrtec if needed                                You may not have any metal on your body including hair pins and              piercings  Do not wear jewelry, make-up, lotions, powders or perfumes, deodorant             Do not wear nail polish.  Do not shave  48 hours prior to surgery.                Do not bring valuables to the hospital. [name].  Contacts, dentures or bridgework may not be worn into surgery.  Leave suitcase in the car. After surgery it may be brought to your room.         Special Instructions: coughing and deep breathing exercises, leg exercises               Please read over the following fact sheets you were given: _____________________________________________________________________             [facility] - Preparing for Surgery Before surgery, you can play an important role.  Because skin is not sterile, your skin needs to be as free of germs as possible.  You can reduce the number of germs on your skin by washing with CHG (chlorahexidine gluconate) soap before surgery.  CHG is an antiseptic cleaner which kills germs and bonds with the skin to continue killing germs even after washing. Please DO NOT use if you have an allergy to CHG or antibacterial soaps.  If your skin becomes reddened/irritated stop using the CHG and inform your nurse when you arrive at Short Stay. Do not shave (including legs and underarms) for at  least 48 hours prior to the first CHG shower.  You may shave your face/neck. Please follow these instructions carefully:  1.  Shower with CHG Soap the night before surgery and the  morning of Surgery.  2.  If you choose to wash your hair, wash your hair first as usual with your  normal  shampoo.  3.  After you shampoo, rinse your hair and body thoroughly to remove the  shampoo.                           4.  Use CHG as you would any other liquid soap.  You can apply chg directly  to the skin and wash  Gently with a scrungie or clean washcloth.  5.  Apply the CHG Soap to your body ONLY FROM THE NECK DOWN.   Do not use on face/ open                           Wound or open sores. Avoid contact with eyes, ears mouth and genitals (private parts).                       Wash face,  Genitals (private parts) with your normal soap.             6.  Wash thoroughly, paying special attention to the area where your surgery  will be performed.  7.  Thoroughly rinse your body with warm water from the neck down.  8.  DO NOT shower/wash with your normal soap after using and rinsing off  the CHG Soap.                9.  Pat yourself dry with a clean towel.            10.  Wear clean pajamas.            11.  Place clean sheets on your bed the night of your first shower and do not  sleep with pets. Day of Surgery : Do not apply any lotions/deodorants the morning of surgery.  Please wear clean clothes to the hospital/surgery center.  FAILURE TO FOLLOW THESE INSTRUCTIONS MAY RESULT IN THE CANCELLATION OF YOUR SURGERY PATIENT SIGNATURE_________________________________  NURSE SIGNATURE__________________________________  ________________________________________________________________________  WHAT IS A BLOOD TRANSFUSION? Blood Transfusion Information  A transfusion is the replacement of blood or some of its parts. Blood is made up of multiple cells which provide different functions.  Red  blood cells carry oxygen and are used for blood loss replacement.  White blood cells fight against infection.  Platelets control bleeding.  Plasma helps clot blood.  Other blood products are available for specialized needs, such as hemophilia or other clotting disorders. BEFORE THE TRANSFUSION  Who gives blood for transfusions?   Healthy volunteers who are fully evaluated to make sure their blood is safe. This is blood bank blood. Transfusion therapy is the safest it has ever been in the practice of medicine. Before blood is taken from a donor, a complete history is taken to make sure that person has no history of diseases nor engages in risky social behavior (examples are intravenous drug use or sexual activity with multiple partners). The donor's travel history is screened to minimize risk of transmitting infections, such as malaria. The donated blood is tested for signs of infectious diseases, such as HIV and hepatitis. The blood is then tested to be sure it is compatible with you in order to minimize the chance of a transfusion reaction. If you or a relative donates blood, this is often done in anticipation of surgery and is not appropriate for emergency situations. It takes many days to process the donated blood. RISKS AND COMPLICATIONS Although transfusion therapy is very safe and saves many lives, the main dangers of transfusion include:  1. Getting an infectious disease. 2. Developing a transfusion reaction. This is an allergic reaction to something in the blood you were given. Every precaution is taken to prevent this. The decision to have a blood transfusion has been considered carefully by your caregiver before blood is given. Blood is not given unless the benefits outweigh  the risks. AFTER THE TRANSFUSION  Right after receiving a blood transfusion, you will usually feel much better and more energetic. This is especially true if your red blood cells have gotten low (anemic). The  transfusion raises the level of the red blood cells which carry oxygen, and this usually causes an energy increase.  The nurse administering the transfusion will monitor you carefully for complications. HOME CARE INSTRUCTIONS  No special instructions are needed after a transfusion. You may find your energy is better. Speak with your caregiver about any limitations on activity for underlying diseases you may have. SEEK MEDICAL CARE IF:   Your condition is not improving after your transfusion.  You develop redness or irritation at the intravenous (IV) site. SEEK IMMEDIATE MEDICAL CARE IF:  Any of the following symptoms occur over the next 12 hours:  Shaking chills.  You have a temperature by mouth above 102 F (38.9 C), not controlled by medicine.  Chest, back, or muscle pain.  People around you feel you are not acting correctly or are confused.  Shortness of breath or difficulty breathing.  Dizziness and fainting.  You get a rash or develop hives.  You have a decrease in urine output.  Your urine turns a dark color or changes to pink, red, or brown. Any of the following symptoms occur over the next 10 days:  You have a temperature by mouth above 102 F (38.9 C), not controlled by medicine.  Shortness of breath.  Weakness after normal activity.  The white part of the eye turns yellow (jaundice).  You have a decrease in the amount of urine or are urinating less often.  Your urine turns a dark color or changes to pink, red, or brown. Document Released: 10/09/2000 Document Revised: 01/04/2012 Document Reviewed: 05/28/2008 ExitCare Patient Information 2014 [place].  _______________________________________________________________________  Incentive Spirometer  An incentive spirometer is a tool that can help keep your lungs clear and active. This tool measures how well you are filling your lungs with each breath. Taking long deep breaths may help reverse or  decrease the chance of developing breathing (pulmonary) problems (especially infection) following:  A long period of time when you are unable to move or be active. BEFORE THE PROCEDURE   If the spirometer includes an indicator to show your best effort, your nurse or respiratory therapist will set it to a desired goal.  If possible, sit up straight or lean slightly forward. Try not to slouch.  Hold the incentive spirometer in an upright position. INSTRUCTIONS FOR USE  3. Sit on the edge of your bed if possible, or sit up as far as you can in bed or on a chair. 4. Hold the incentive spirometer in an upright position. 5. Breathe out normally. 6. Place the mouthpiece in your mouth and seal your lips tightly around it. 7. Breathe in slowly and as deeply as possible, raising the piston or the ball toward the top of the column. 8. Hold your breath for 3-5 seconds or for as long as possible. Allow the piston or ball to fall to the bottom of the column. 9. Remove the mouthpiece from your mouth and breathe out normally. 10. Rest for a few seconds and repeat Steps 1 through 7 at least 10 times every 1-2 hours when you are awake. Take your time and take a few normal breaths between deep breaths. 11. The spirometer may include an indicator to show your best effort. Use the indicator as a goal to work  toward during each repetition. 12. After each set of 10 deep breaths, practice coughing to be sure your lungs are clear. If you have an incision (the cut made at the time of surgery), support your incision when coughing by placing a pillow or rolled up towels firmly against it. Once you are able to get out of bed, walk around indoors and cough well. You may stop using the incentive spirometer when instructed by your caregiver.  RISKS AND COMPLICATIONS  Take your time so you do not get dizzy or light-headed.  If you are in pain, you may need to take or ask for pain medication before doing incentive  spirometry. It is harder to take a deep breath if you are having pain. AFTER USE  Rest and breathe slowly and easily.  It can be helpful to keep track of a log of your progress. Your caregiver can provide you with a simple table to help with this. If you are using the spirometer at home, follow these instructions: [place] IF:   You are having difficultly using the spirometer.  You have trouble using the spirometer as often as instructed.  Your pain medication is not giving enough relief while using the spirometer.  You develop fever of 100.5 F (38.1 C) or higher. SEEK IMMEDIATE MEDICAL CARE IF:   You cough up bloody sputum that had not been present before.  You develop fever of 102 F (38.9 C) or greater.  You develop worsening pain at or near the incision site. MAKE SURE YOU:   Understand these instructions.  Will watch your condition.  Will get help right away if you are not doing well or get worse. Document Released: 02/22/2007 Document Revised: 01/04/2012 Document Reviewed: 04/25/2007 [facility] Patient Information 2014 [place], [place].   ________________________________________________________________________

## 2015-03-20 ENCOUNTER — Encounter (HOSPITAL_COMMUNITY): Payer: Self-pay

## 2015-03-20 ENCOUNTER — Encounter (HOSPITAL_COMMUNITY)
Admission: RE | Admit: 2015-03-20 | Discharge: 2015-03-20 | Disposition: A | Discharge status: Home / Self Care | Payer: BC Managed Care – PPO | Source: Ambulatory Visit | Attending: Orthopedic Surgery | Admitting: Orthopedic Surgery

## 2015-03-20 DIAGNOSIS — Z01812 Encounter for preprocedural laboratory examination: Secondary | ICD-10-CM | POA: Diagnosis present

## 2015-03-20 DIAGNOSIS — M1712 Unilateral primary osteoarthritis, left knee: Secondary | ICD-10-CM | POA: Diagnosis not present

## 2015-03-20 HISTORY — DX: Family history of other specified conditions: Z84.89

## 2015-03-20 HISTORY — DX: Other specified postprocedural states: Z98.890

## 2015-03-20 HISTORY — DX: Nausea with vomiting, unspecified: R11.2

## 2015-03-20 HISTORY — DX: Other specified postprocedural states: R11.2

## 2015-03-20 LAB — URINALYSIS, ROUTINE W REFLEX MICROSCOPIC
BILIRUBIN URINE: NEGATIVE
Glucose, UA: NEGATIVE mg/dL
Hgb urine dipstick: NEGATIVE
Ketones, ur: NEGATIVE mg/dL
Leukocytes, UA: NEGATIVE
NITRITE: NEGATIVE
PH: 5.5 (ref 5.0–8.0)
Protein, ur: NEGATIVE mg/dL
Specific Gravity, Urine: 1.007 (ref 1.005–1.030)
Urobilinogen, UA: 0.2 mg/dL (ref 0.0–1.0)

## 2015-03-20 LAB — SURGICAL PCR SCREEN
MRSA, PCR: NEGATIVE
STAPHYLOCOCCUS AUREUS: POSITIVE — AB

## 2015-03-20 LAB — CBC
HCT: 45.6 % (ref 36.0–46.0)
Hemoglobin: 15 g/dL (ref 12.0–15.0)
MCH: 31.1 pg (ref 26.0–34.0)
MCHC: 32.9 g/dL (ref 30.0–36.0)
MCV: 94.6 fL (ref 78.0–100.0)
Platelets: 218 10*3/uL (ref 150–400)
RBC: 4.82 MIL/uL (ref 3.87–5.11)
RDW: 13.6 % (ref 11.5–15.5)
WBC: 7.3 10*3/uL (ref 4.0–10.5)

## 2015-03-20 LAB — BASIC METABOLIC PANEL
Anion gap: 6 (ref 5–15)
BUN: 23 mg/dL — ABNORMAL HIGH (ref 6–20)
CHLORIDE: 107 mmol/L (ref 101–111)
CO2: 27 mmol/L (ref 22–32)
CREATININE: 0.93 mg/dL (ref 0.44–1.00)
Calcium: 9.3 mg/dL (ref 8.9–10.3)
GFR calc non Af Amer: >60 mL/min (ref >60)
GLUCOSE: 92 mg/dL (ref 65–99)
POTASSIUM: 4.2 mmol/L (ref 3.5–5.1)
Sodium: 140 mmol/L (ref 135–145)

## 2015-03-20 LAB — PROTIME-INR
INR: 0.88 (ref 0.00–1.49)
Prothrombin Time: 12.2 seconds (ref 11.6–15.2)

## 2015-03-20 LAB — ABO/RH: ABO/RH(D): A POS

## 2015-03-20 LAB — APTT: APTT: 26 s (ref 24–37)

## 2015-03-21 NOTE — Progress Notes (Signed)
Patient called back and has received message regarding PCR results.  Will pick up Mupirocin ointment today and use twice daily for 5 days.

## 2015-03-26 ENCOUNTER — Inpatient Hospital Stay (HOSPITAL_COMMUNITY)
Admission: RE | Admit: 2015-03-26 | Discharge: 2015-03-27 | DRG: 470 | Disposition: A | Discharge status: Home / Self Care | Payer: BC Managed Care – PPO | Source: Ambulatory Visit | Attending: Orthopedic Surgery | Admitting: Orthopedic Surgery

## 2015-03-26 ENCOUNTER — Inpatient Hospital Stay (HOSPITAL_COMMUNITY): Payer: BC Managed Care – PPO | Admitting: Anesthesiology

## 2015-03-26 ENCOUNTER — Encounter (HOSPITAL_COMMUNITY): Payer: Self-pay | Admitting: *Deleted

## 2015-03-26 ENCOUNTER — Encounter (HOSPITAL_COMMUNITY)
Admission: RE | Disposition: A | Discharge status: Home / Self Care | Payer: Self-pay | Source: Ambulatory Visit | Attending: Orthopedic Surgery

## 2015-03-26 DIAGNOSIS — M1712 Unilateral primary osteoarthritis, left knee: Secondary | ICD-10-CM | POA: Diagnosis present

## 2015-03-26 DIAGNOSIS — Z6832 Body mass index (BMI) 32.0-32.9, adult: Secondary | ICD-10-CM | POA: Diagnosis not present

## 2015-03-26 DIAGNOSIS — E785 Hyperlipidemia, unspecified: Secondary | ICD-10-CM | POA: Diagnosis present

## 2015-03-26 DIAGNOSIS — E669 Obesity, unspecified: Secondary | ICD-10-CM | POA: Diagnosis present

## 2015-03-26 DIAGNOSIS — Z96652 Presence of left artificial knee joint: Secondary | ICD-10-CM

## 2015-03-26 DIAGNOSIS — M659 Synovitis and tenosynovitis, unspecified: Secondary | ICD-10-CM | POA: Diagnosis present

## 2015-03-26 DIAGNOSIS — I2510 Atherosclerotic heart disease of native coronary artery without angina pectoris: Secondary | ICD-10-CM | POA: Diagnosis present

## 2015-03-26 DIAGNOSIS — Z888 Allergy status to other drugs, medicaments and biological substances status: Secondary | ICD-10-CM | POA: Diagnosis not present

## 2015-03-26 DIAGNOSIS — Z96659 Presence of unspecified artificial knee joint: Secondary | ICD-10-CM

## 2015-03-26 HISTORY — PX: TOTAL KNEE ARTHROPLASTY: SHX125

## 2015-03-26 LAB — TYPE AND SCREEN
ABO/RH(D): A POS
ANTIBODY SCREEN: NEGATIVE

## 2015-03-26 SURGERY — ARTHROPLASTY, KNEE, TOTAL
Anesthesia: Spinal | Site: Knee | Laterality: Left

## 2015-03-26 MED ORDER — METOCLOPRAMIDE HCL 5 MG PO TABS
5.0000 mg | ORAL_TABLET | Freq: Three times a day (TID) | ORAL | Status: DC | PRN
  Filled 2015-03-26: qty 2

## 2015-03-26 MED ORDER — CEFAZOLIN SODIUM-DEXTROSE 2-3 GM-% IV SOLR
INTRAVENOUS | Status: AC
  Filled 2015-03-26: qty 50

## 2015-03-26 MED ORDER — SODIUM CHLORIDE 0.9 % IJ SOLN
INTRAMUSCULAR | Status: DC | PRN
  Administered 2015-03-26: 29 mL

## 2015-03-26 MED ORDER — MAGNESIUM CITRATE PO SOLN: 1.0000 | Freq: Once | ORAL | Status: AC | PRN

## 2015-03-26 MED ORDER — ONDANSETRON HCL 4 MG PO TABS: 4.0000 mg | ORAL_TABLET | Freq: Four times a day (QID) | ORAL | Status: DC | PRN

## 2015-03-26 MED ORDER — PROPOFOL INFUSION 10 MG/ML OPTIME
INTRAVENOUS | Status: DC | PRN
  Administered 2015-03-26: 160 ug/kg/min via INTRAVENOUS

## 2015-03-26 MED ORDER — EZETIMIBE-SIMVASTATIN 10-40 MG PO TABS
1.0000 | ORAL_TABLET | ORAL | Status: DC
  Administered 2015-03-26: 1 via ORAL
  Filled 2015-03-26 (×2): qty 1

## 2015-03-26 MED ORDER — PROPOFOL 10 MG/ML IV BOLUS
INTRAVENOUS | Status: AC
  Filled 2015-03-26: qty 20

## 2015-03-26 MED ORDER — METRONIDAZOLE 0.75 % EX GEL
1.0000 "application " | Freq: Every day | CUTANEOUS | Status: DC
  Administered 2015-03-26: 1 via TOPICAL
  Filled 2015-03-26: qty 45

## 2015-03-26 MED ORDER — TRANEXAMIC ACID 1000 MG/10ML IV SOLN
1000.0000 mg | Freq: Once | INTRAVENOUS | Status: AC
  Administered 2015-03-26: 1000 mg via INTRAVENOUS
  Filled 2015-03-26: qty 10

## 2015-03-26 MED ORDER — FENTANYL CITRATE (PF) 100 MCG/2ML IJ SOLN
INTRAMUSCULAR | Status: DC | PRN
  Administered 2015-03-26 (×2): 25 ug via INTRAVENOUS
  Administered 2015-03-26: 50 ug via INTRAVENOUS
  Administered 2015-03-26 (×2): 25 ug via INTRAVENOUS
  Administered 2015-03-26: 50 ug via INTRAVENOUS

## 2015-03-26 MED ORDER — ONDANSETRON HCL 4 MG/2ML IJ SOLN: 4.0000 mg | Freq: Once | INTRAMUSCULAR | Status: DC | PRN

## 2015-03-26 MED ORDER — SODIUM CHLORIDE 0.9 % IR SOLN
Status: DC | PRN
  Administered 2015-03-26: 1000 mL

## 2015-03-26 MED ORDER — METHOCARBAMOL 1000 MG/10ML IJ SOLN
500.0000 mg | Freq: Four times a day (QID) | INTRAVENOUS | Status: DC | PRN
  Administered 2015-03-26: 500 mg via INTRAVENOUS
  Filled 2015-03-26 (×2): qty 5

## 2015-03-26 MED ORDER — KETOROLAC TROMETHAMINE 30 MG/ML IJ SOLN
INTRAMUSCULAR | Status: DC | PRN
  Administered 2015-03-26: 30 mg

## 2015-03-26 MED ORDER — METOCLOPRAMIDE HCL 5 MG/ML IJ SOLN
5.0000 mg | Freq: Three times a day (TID) | INTRAMUSCULAR | Status: DC | PRN
  Administered 2015-03-26: 10 mg via INTRAVENOUS
  Filled 2015-03-26: qty 2

## 2015-03-26 MED ORDER — DOCUSATE SODIUM 100 MG PO CAPS
100.0000 mg | ORAL_CAPSULE | Freq: Two times a day (BID) | ORAL | Status: DC
  Administered 2015-03-26 – 2015-03-27 (×2): 100 mg via ORAL

## 2015-03-26 MED ORDER — BUPIVACAINE-EPINEPHRINE (PF) 0.25% -1:200000 IJ SOLN
INTRAMUSCULAR | Status: DC | PRN
  Administered 2015-03-26: 30 mL

## 2015-03-26 MED ORDER — DEXAMETHASONE SODIUM PHOSPHATE 10 MG/ML IJ SOLN
INTRAMUSCULAR | Status: AC
  Filled 2015-03-26: qty 1

## 2015-03-26 MED ORDER — ALUM & MAG HYDROXIDE-SIMETH 200-200-20 MG/5ML PO SUSP: 30.0000 mL | ORAL | Status: DC | PRN

## 2015-03-26 MED ORDER — HYDROCODONE-ACETAMINOPHEN 7.5-325 MG PO TABS
1.0000 | ORAL_TABLET | ORAL | Status: DC
  Administered 2015-03-26: 2 via ORAL
  Administered 2015-03-26 (×2): 1 via ORAL
  Administered 2015-03-27: 2 via ORAL
  Administered 2015-03-27: 1 via ORAL
  Administered 2015-03-27: 2 via ORAL
  Filled 2015-03-26: qty 2
  Filled 2015-03-26: qty 1
  Filled 2015-03-26 (×2): qty 2
  Filled 2015-03-26: qty 1
  Filled 2015-03-26: qty 2

## 2015-03-26 MED ORDER — FENTANYL CITRATE (PF) 100 MCG/2ML IJ SOLN
INTRAMUSCULAR | Status: AC
  Filled 2015-03-26: qty 2

## 2015-03-26 MED ORDER — POLYETHYLENE GLYCOL 3350 17 G PO PACK
17.0000 g | PACK | Freq: Two times a day (BID) | ORAL | Status: DC
  Administered 2015-03-26 – 2015-03-27 (×2): 17 g via ORAL

## 2015-03-26 MED ORDER — CEFAZOLIN SODIUM-DEXTROSE 2-3 GM-% IV SOLR
2.0000 g | Freq: Four times a day (QID) | INTRAVENOUS | Status: AC
  Administered 2015-03-26 (×2): 2 g via INTRAVENOUS
  Filled 2015-03-26 (×2): qty 50

## 2015-03-26 MED ORDER — BISACODYL 10 MG RE SUPP: 10.0000 mg | Freq: Every day | RECTAL | Status: DC | PRN

## 2015-03-26 MED ORDER — HYDROMORPHONE HCL 1 MG/ML IJ SOLN: 0.5000 mg | INTRAMUSCULAR | Status: DC | PRN

## 2015-03-26 MED ORDER — CELECOXIB 200 MG PO CAPS
200.0000 mg | ORAL_CAPSULE | Freq: Two times a day (BID) | ORAL | Status: DC
  Administered 2015-03-26 – 2015-03-27 (×3): 200 mg via ORAL
  Filled 2015-03-26 (×5): qty 1

## 2015-03-26 MED ORDER — METHOCARBAMOL 500 MG PO TABS
500.0000 mg | ORAL_TABLET | Freq: Four times a day (QID) | ORAL | Status: DC | PRN
  Administered 2015-03-27: 500 mg via ORAL
  Filled 2015-03-26: qty 1

## 2015-03-26 MED ORDER — ONDANSETRON HCL 4 MG/2ML IJ SOLN
4.0000 mg | Freq: Four times a day (QID) | INTRAMUSCULAR | Status: DC | PRN
  Administered 2015-03-26 – 2015-03-27 (×2): 4 mg via INTRAVENOUS
  Filled 2015-03-26: qty 2

## 2015-03-26 MED ORDER — BUPIVACAINE HCL (PF) 0.75 % IJ SOLN
INTRAMUSCULAR | Status: DC | PRN
  Administered 2015-03-26: 2 mL

## 2015-03-26 MED ORDER — MIDAZOLAM HCL 5 MG/5ML IJ SOLN
INTRAMUSCULAR | Status: DC | PRN
  Administered 2015-03-26: 2 mg via INTRAVENOUS

## 2015-03-26 MED ORDER — HYDROMORPHONE HCL 1 MG/ML IJ SOLN
0.2500 mg | INTRAMUSCULAR | Status: DC | PRN
  Administered 2015-03-26 (×2): 0.5 mg via INTRAVENOUS

## 2015-03-26 MED ORDER — CHLORHEXIDINE GLUCONATE 4 % EX LIQD: 60.0000 mL | Freq: Once | CUTANEOUS | Status: DC

## 2015-03-26 MED ORDER — SODIUM CHLORIDE 0.9 % IV SOLN
INTRAVENOUS | Status: DC
  Administered 2015-03-26 (×2): via INTRAVENOUS
  Filled 2015-03-26 (×9): qty 1000

## 2015-03-26 MED ORDER — DEXAMETHASONE SODIUM PHOSPHATE 10 MG/ML IJ SOLN
10.0000 mg | Freq: Once | INTRAMUSCULAR | Status: AC
  Administered 2015-03-26: 10 mg via INTRAVENOUS

## 2015-03-26 MED ORDER — FENTANYL CITRATE (PF) 100 MCG/2ML IJ SOLN: 25.0000 ug | INTRAMUSCULAR | Status: DC | PRN

## 2015-03-26 MED ORDER — LACTATED RINGERS IV SOLN: INTRAVENOUS | Status: DC

## 2015-03-26 MED ORDER — KETOROLAC TROMETHAMINE 30 MG/ML IJ SOLN
INTRAMUSCULAR | Status: AC
  Filled 2015-03-26: qty 1

## 2015-03-26 MED ORDER — FERROUS SULFATE 325 (65 FE) MG PO TABS
325.0000 mg | ORAL_TABLET | Freq: Three times a day (TID) | ORAL | Status: DC
  Filled 2015-03-26 (×5): qty 1

## 2015-03-26 MED ORDER — BUPIVACAINE-EPINEPHRINE (PF) 0.25% -1:200000 IJ SOLN
INTRAMUSCULAR | Status: AC
  Filled 2015-03-26: qty 30

## 2015-03-26 MED ORDER — MENTHOL 3 MG MT LOZG: 1.0000 | LOZENGE | OROMUCOSAL | Status: DC | PRN

## 2015-03-26 MED ORDER — DEXAMETHASONE SODIUM PHOSPHATE 10 MG/ML IJ SOLN
10.0000 mg | Freq: Once | INTRAMUSCULAR | Status: AC
  Administered 2015-03-27: 10 mg via INTRAVENOUS
  Filled 2015-03-26: qty 1

## 2015-03-26 MED ORDER — CEFAZOLIN SODIUM-DEXTROSE 2-3 GM-% IV SOLR
2.0000 g | INTRAVENOUS | Status: AC
  Administered 2015-03-26: 2 g via INTRAVENOUS

## 2015-03-26 MED ORDER — LACTATED RINGERS IV SOLN
INTRAVENOUS | Status: DC | PRN
  Administered 2015-03-26 (×3): via INTRAVENOUS

## 2015-03-26 MED ORDER — DIPHENHYDRAMINE HCL 25 MG PO CAPS: 25.0000 mg | ORAL_CAPSULE | Freq: Four times a day (QID) | ORAL | Status: DC | PRN

## 2015-03-26 MED ORDER — PHENOL 1.4 % MT LIQD
1.0000 | OROMUCOSAL | Status: DC | PRN
  Filled 2015-03-26: qty 177

## 2015-03-26 MED ORDER — HYDROMORPHONE HCL 1 MG/ML IJ SOLN
INTRAMUSCULAR | Status: AC
  Filled 2015-03-26: qty 1

## 2015-03-26 MED ORDER — PROPOFOL 10 MG/ML IV BOLUS
INTRAVENOUS | Status: DC | PRN
  Administered 2015-03-26: 30 mg via INTRAVENOUS

## 2015-03-26 MED ORDER — SODIUM CHLORIDE 0.9 % IJ SOLN
INTRAMUSCULAR | Status: AC
  Filled 2015-03-26: qty 50

## 2015-03-26 MED ORDER — ASPIRIN EC 325 MG PO TBEC
325.0000 mg | DELAYED_RELEASE_TABLET | Freq: Two times a day (BID) | ORAL | Status: DC
  Administered 2015-03-27: 325 mg via ORAL
  Filled 2015-03-26 (×3): qty 1

## 2015-03-26 MED ORDER — 0.9 % SODIUM CHLORIDE (POUR BTL) OPTIME
TOPICAL | Status: DC | PRN
  Administered 2015-03-26: 1000 mL

## 2015-03-26 MED ORDER — LORATADINE 10 MG PO TABS
10.0000 mg | ORAL_TABLET | Freq: Every day | ORAL | Status: DC
  Filled 2015-03-26 (×2): qty 1

## 2015-03-26 MED ORDER — MIDAZOLAM HCL 2 MG/2ML IJ SOLN
INTRAMUSCULAR | Status: AC
  Filled 2015-03-26: qty 2

## 2015-03-26 SURGICAL SUPPLY — 61 items
BAG SPEC THK2 15X12 ZIP CLS (MISCELLANEOUS)
BAG ZIPLOCK 12X15 (MISCELLANEOUS) IMPLANT
BANDAGE ELASTIC 6 VELCRO ST LF (GAUZE/BANDAGES/DRESSINGS) ×3 IMPLANT
BANDAGE ESMARK 6X9 LF (GAUZE/BANDAGES/DRESSINGS) ×1 IMPLANT
BLADE SAW SGTL 13.0X1.19X90.0M (BLADE) ×3 IMPLANT
BNDG CMPR 9X6 STRL LF SNTH (GAUZE/BANDAGES/DRESSINGS) ×1
BNDG ESMARK 6X9 LF (GAUZE/BANDAGES/DRESSINGS) ×3
BOWL SMART MIX CTS (DISPOSABLE) ×3 IMPLANT
CAPT KNEE TOTAL 3 ATTUNE ×2 IMPLANT
CEMENT HV SMART SET (Cement) ×4 IMPLANT
CUFF TOURN SGL QUICK 34 (TOURNIQUET CUFF) ×3
CUFF TRNQT CYL 34X4X40X1 (TOURNIQUET CUFF) ×1 IMPLANT
DECANTER SPIKE VIAL GLASS SM (MISCELLANEOUS) ×3 IMPLANT
DRAPE EXTREMITY T 121X128X90 (DRAPE) ×3 IMPLANT
DRAPE POUCH INSTRU U-SHP 10X18 (DRAPES) ×3 IMPLANT
DRAPE U-SHAPE 47X51 STRL (DRAPES) ×3 IMPLANT
DRSG AQUACEL AG ADV 3.5X10 (GAUZE/BANDAGES/DRESSINGS) ×3 IMPLANT
DURAPREP 26ML APPLICATOR (WOUND CARE) ×6 IMPLANT
ELECT REM PT RETURN 9FT ADLT (ELECTROSURGICAL) ×3
ELECTRODE REM PT RTRN 9FT ADLT (ELECTROSURGICAL) ×1 IMPLANT
FACESHIELD WRAPAROUND (MASK) ×15 IMPLANT
FACESHIELD WRAPAROUND OR TEAM (MASK) ×5 IMPLANT
GLOVE BIOGEL PI IND STRL 6.5 (GLOVE) IMPLANT
GLOVE BIOGEL PI IND STRL 7.0 (GLOVE) IMPLANT
GLOVE BIOGEL PI IND STRL 7.5 (GLOVE) ×1 IMPLANT
GLOVE BIOGEL PI IND STRL 8.5 (GLOVE) ×1 IMPLANT
GLOVE BIOGEL PI INDICATOR 6.5 (GLOVE) ×4
GLOVE BIOGEL PI INDICATOR 7.0 (GLOVE) ×2
GLOVE BIOGEL PI INDICATOR 7.5 (GLOVE) ×2
GLOVE BIOGEL PI INDICATOR 8.5 (GLOVE) ×2
GLOVE ECLIPSE 8.0 STRL XLNG CF (GLOVE) ×5 IMPLANT
GLOVE ORTHO TXT STRL SZ7.5 (GLOVE) ×4 IMPLANT
GLOVE SURG SS PI 6.5 STRL IVOR (GLOVE) ×2 IMPLANT
GLOVE SURG SS PI 7.0 STRL IVOR (GLOVE) ×2 IMPLANT
GOWN SPEC L3 XXLG W/TWL (GOWN DISPOSABLE) ×3 IMPLANT
GOWN STRL REUS W/TWL LRG LVL3 (GOWN DISPOSABLE) ×3 IMPLANT
GOWN STRL REUS W/TWL XL LVL3 (GOWN DISPOSABLE) ×4 IMPLANT
HANDPIECE INTERPULSE COAX TIP (DISPOSABLE) ×3
KIT BASIN OR (CUSTOM PROCEDURE TRAY) ×3 IMPLANT
LIQUID BAND (GAUZE/BANDAGES/DRESSINGS) ×3 IMPLANT
MANIFOLD NEPTUNE II (INSTRUMENTS) ×3 IMPLANT
NDL SAFETY ECLIPSE 18X1.5 (NEEDLE) ×1 IMPLANT
NEEDLE HYPO 18GX1.5 SHARP (NEEDLE) ×3
PACK TOTAL JOINT (CUSTOM PROCEDURE TRAY) ×3 IMPLANT
PEN SKIN MARKING BROAD (MISCELLANEOUS) ×3 IMPLANT
POSITIONER SURGICAL ARM (MISCELLANEOUS) ×3 IMPLANT
SET HNDPC FAN SPRY TIP SCT (DISPOSABLE) ×1 IMPLANT
SET PAD KNEE POSITIONER (MISCELLANEOUS) ×3 IMPLANT
SUCTION FRAZIER 12FR DISP (SUCTIONS) ×3 IMPLANT
SUT MNCRL AB 4-0 PS2 18 (SUTURE) ×3 IMPLANT
SUT VIC AB 1 CT1 36 (SUTURE) ×3 IMPLANT
SUT VIC AB 2-0 CT1 27 (SUTURE) ×9
SUT VIC AB 2-0 CT1 TAPERPNT 27 (SUTURE) ×3 IMPLANT
SUT VLOC 180 0 24IN GS25 (SUTURE) ×3 IMPLANT
SYR 50ML LL SCALE MARK (SYRINGE) ×3 IMPLANT
TOWEL OR 17X26 10 PK STRL BLUE (TOWEL DISPOSABLE) ×3 IMPLANT
TOWEL OR NON WOVEN STRL DISP B (DISPOSABLE) ×2 IMPLANT
TRAY FOLEY W/METER SILVER 14FR (SET/KITS/TRAYS/PACK) ×3 IMPLANT
WATER STERILE IRR 1500ML POUR (IV SOLUTION) ×3 IMPLANT
WRAP KNEE MAXI GEL POST OP (GAUZE/BANDAGES/DRESSINGS) ×3 IMPLANT
YANKAUER SUCT BULB TIP 10FT TU (MISCELLANEOUS) ×3 IMPLANT

## 2015-03-26 NOTE — Anesthesia Postprocedure Evaluation (Signed)
  Anesthesia Post-op Note  Patient: Carol Cox  Procedure(s) Performed: Procedure(s) (LRB): LEFT TOTAL KNEE ARTHROPLASTY (Left)  Patient Location: PACU  Anesthesia Type: Spinal  Level of Consciousness: awake and alert   Airway and Oxygen Therapy: Patient Spontanous Breathing  Post-op Pain: mild  Post-op Assessment: Post-op Vital signs reviewed, Patient's Cardiovascular Status Stable, Respiratory Function Stable, Patent Airway and No signs of Nausea or vomiting  Last Vitals:  Filed Vitals:   03/26/15 1130  BP: 118/74  Pulse: 89  Temp: 36.2 C  Resp: 14    Post-op Vital Signs: stable   Complications: No apparent anesthesia complications. Regression of block demonstrated

## 2015-03-26 NOTE — Interval H&P Note (Signed)
History and Physical Interval Note:  03/26/2015 8:40 AM  Carol Cox  has presented today for surgery, with the diagnosis of LEFT KNEE OA  The various methods of treatment have been discussed with the patient and family. After consideration of risks, benefits and other options for treatment, the patient has consented to  Procedure(s): LEFT TOTAL KNEE ARTHROPLASTY (Left) as a surgical intervention .  The patient's history has been reviewed, patient examined, no change in status, stable for surgery.  I have reviewed the patient's chart and labs.  Questions were answered to the patient's satisfaction.     Mauri Pole

## 2015-03-26 NOTE — Progress Notes (Signed)
PT Cancellation Note  Patient Details Name: Carol Cox MRN: [id number] DOB: 1951/05/09   Cancelled Treatment:    Reason Eval/Treat Not Completed: Medical issues which prohibited therapy (leg was still too numb)   Claretha Cooper 03/26/2015, 5:54 PM

## 2015-03-26 NOTE — Op Note (Signed)
NAME:  Carol Cox                      MEDICAL RECORD NO.:  [id number]                             FACILITY:  [facility]      PHYSICIAN:  Pietro Cassis. Alvan Dame, M.D.  DATE OF BIRTH:  1950/12/08      DATE OF PROCEDURE:  03/26/2015                                     OPERATIVE REPORT         PREOPERATIVE DIAGNOSIS:  Left knee osteoarthritis.      POSTOPERATIVE DIAGNOSIS:  Left knee osteoarthritis.      FINDINGS:  The patient was noted to have complete loss of cartilage and   bone-on-bone arthritis with associated osteophytes in the medial and patellofemoral compartments of   the knee with a significant synovitis and associated effusion.      PROCEDURE:  Left total knee replacement.      COMPONENTS USED:  DePuy Attune rotating platform posterior stabilized knee   system, a size 5N femur, 4 tibia, size 6 mm AOX PS insert, and 35 patellar   button.      SURGEON:  Pietro Cassis. Alvan Dame, M.D.      ASSISTANT:  Danae Orleans, PA-C.      ANESTHESIA:  Spinal.      SPECIMENS:  None.      COMPLICATION:  None.      DRAINS:   None.  EBL: <50cc      TOURNIQUET TIME:   Total Tourniquet Time Documented: Thigh (Left) - 31 minutes Total: Thigh (Left) - 31 minutes  .      The patient was stable to the recovery room.      INDICATION FOR PROCEDURE:  DEMONI Cox is a 64 y.o. female patient of   mine.  The patient had been seen, evaluated, and treated conservatively in the   office with medication, activity modification, and injections.  The patient had   radiographic changes of bone-on-bone arthritis with endplate sclerosis and osteophytes noted.      The patient failed conservative measures including medication, injections, and activity modification, and at this point was ready for more definitive measures.   Based on the radiographic changes and failed conservative measures, the patient   decided to proceed with total knee replacement.  Risks of infection,   DVT, component failure, need for  revision surgery, postop course, and   expectations were all   discussed and reviewed.  Consent was obtained for benefit of pain   relief.      PROCEDURE IN DETAIL:  The patient was brought to the operative theater.   Once adequate anesthesia, preoperative antibiotics, 2 gm of Ancef, 1gm of Tranexamic Acid, and 10mg  of Decadron administered, the patient was positioned supine with the left thigh tourniquet placed.  The  left lower extremity was prepped and draped in sterile fashion.  A time-   out was performed identifying the patient, planned procedure, and   extremity.      The left lower extremity was placed in the [facility] leg holder.  The leg was   exsanguinated, tourniquet elevated to 250 mmHg.  A midline incision was   made followed by  median parapatellar arthrotomy.  Following initial   exposure, attention was first directed to the patella.  Precut   measurement was noted to be 20 mm.  I resected down to 14 mm and used a   35 patellar button to restore patellar height as well as cover the cut   surface.      The lug holes were drilled and a metal shim was placed to protect the   patella from retractors and saw blades.      At this point, attention was now directed to the femur.  The femoral   canal was opened with a drill, irrigated to try to prevent fat emboli.  An   intramedullary rod was passed at 3 degrees valgus, 9 mm of bone was   resected off the distal femur.  Following this resection, the tibia was   subluxated anteriorly.  Using the extramedullary guide, 2 mm of bone was resected off   the proximal medial tibia.  We confirmed the gap would be   stable medially and laterally with a size 6 mm insert as well as confirmed   the cut was perpendicular in the coronal plane, checking with an alignment rod.      Once this was done, I sized the femur to be a size 5 in the anterior-   posterior dimension, chose a narrow component based on medial and   lateral dimension.  The size  5 rotation block was then pinned in   position anterior referenced using the C-clamp to set rotation.  The   anterior, posterior, and  chamfer cuts were made without difficulty nor   notching making certain that I was along the anterior cortex to help   with flexion gap stability.      The final box cut was made off the lateral aspect of distal femur.      At this point, the tibia was sized to be a size 4, the size 4 tray was   then pinned in position through the medial third of the tubercle,   drilled, and keel punched.  Trial reduction was now carried with a 5 femur,  4 tibia, a 5 then 5 then 6 mm insert, and the 35 patella botton.  The knee was brought to   extension, full extension with good flexion stability with the patella   tracking through the trochlea without application of pressure.  Given   all these findings, the trial components removed.  Final components were   opened and cement was mixed.  The knee was irrigated with normal saline   solution and pulse lavage.  The synovial lining was   then injected with 30cc of 0.25% Marcaine with epinephrine and 1 cc of Toradol plus 30cc of NS for a   total of 61 cc.      The knee was irrigated.  Final implants were then cemented onto clean and   dried cut surfaces of bone with the knee brought to extension with a size 6   mm trial insert.      Once the cement had fully cured, the excess cement was removed   throughout the knee.  I confirmed I was satisfied with the range of   motion and stability, and the final ize 6 mm PS AOX insert was chosen.  It was   placed into the knee.      The tourniquet had been let down at 31 minutes.  No significant   hemostasis required.  The   extensor mechanism was then reapproximated using #1 Vicryl with the knee   in flexion.  The   remaining wound was closed with 2-0 Vicryl and running 4-0 Monocryl.   The knee was cleaned, dried, dressed sterilely using Dermabond and   Aquacel dressing.  The  patient was then   brought to recovery room in stable condition, tolerating the procedure   well.   Please note that Physician Assistant, Danae Orleans, AP-C, was present for the entirety of the case, and was utilized for pre-operative positioning, peri-operative retractor management, general facilitation of the procedure.  He was also utilized for primary wound closure at the end of the case.              Pietro Cassis Alvan Dame, M.D.    03/26/2015 11:02 AM

## 2015-03-26 NOTE — Transfer of Care (Signed)
Immediate Anesthesia Transfer of Care Note  Patient: Carol Cox  Procedure(s) Performed: Procedure(s): LEFT TOTAL KNEE ARTHROPLASTY (Left)  Patient Location: PACU  Anesthesia Type:Regional and Spinal  Level of Consciousness: awake, sedated and patient cooperative  Airway & Oxygen Therapy: Patient Spontanous Breathing and Patient connected to face mask oxygen  Post-op Assessment: Report given to RN and Post -op Vital signs reviewed and stable  Post vital signs: Reviewed and stable  Last Vitals:  Filed Vitals:   03/26/15 0707  BP: 141/85  Pulse: 80  Temp: 36.6 C  Resp: 18    Complications: No apparent anesthesia complications

## 2015-03-26 NOTE — Anesthesia Preprocedure Evaluation (Addendum)
Anesthesia Evaluation  Patient identified by MRN, date of birth, ID band Patient awake    Reviewed: Allergy & Precautions, NPO status , Patient's Chart, lab work & pertinent test results  History of Anesthesia Complications (+) PONV and history of anesthetic complications (reports hx of mom having PONV as well, no other family hx of problems)  Airway Mallampati: II  TM Distance: >3 FB Neck ROM: Full    Dental no notable dental hx. (+) Dental Advisory Given   Pulmonary neg pulmonary ROS,  breath sounds clear to auscultation  Pulmonary exam normal       Cardiovascular + CAD Normal cardiovascular examRhythm:Regular Rate:Normal  Hyperlipidemia 2. GERD 3. Possible vasovagal symptoms 4. LHC (4/14) with EF 65-70%, 25% mLAD.   Neuro/Psych negative neurological ROS  negative psych ROS   GI/Hepatic Neg liver ROS, GERD-  Medicated and Controlled,  Endo/Other  obesity  Renal/GU negative Renal ROS  negative genitourinary   Musculoskeletal  (+) Arthritis -, Osteoarthritis,    Abdominal   Peds negative pediatric ROS (+)  Hematology negative hematology ROS (+)   Anesthesia Other Findings   Reproductive/Obstetrics negative OB ROS                             Anesthesia Physical Anesthesia Plan  ASA: II  Anesthesia Plan: Spinal   Post-op Pain Management:    Induction:   Airway Management Planned:   Additional Equipment:   Intra-op Plan:   Post-operative Plan:   Informed Consent: I have reviewed the patients History and Physical, chart, labs and discussed the procedure including the risks, benefits and alternatives for the proposed anesthesia with the patient or authorized representative who has indicated his/her understanding and acceptance.   Dental advisory given  Plan Discussed with:   Anesthesia Plan Comments:         Anesthesia Quick Evaluation

## 2015-03-26 NOTE — Anesthesia Procedure Notes (Signed)
Spinal Patient location during procedure: OR Staffing Anesthesiologist: Truong Delcastillo Performed by: anesthesiologist  Preanesthetic Checklist Completed: patient identified, site marked, surgical consent, pre-op evaluation, timeout performed, IV checked, risks and benefits discussed and monitors and equipment checked Spinal Block Patient position: sitting Prep: ChloraPrep Patient monitoring: continuous pulse ox, blood pressure and heart rate Approach: midline Location: L3-4 Injection technique: single-shot Needle Needle type: Sprotte  Needle gauge: 24 G Needle length: 9 cm Assessment Sensory level: T8 Additional Notes Functioning IV was confirmed and monitors were applied. Sterile prep and drape, including hand hygiene and sterile gloves were used. The patient was positioned and the spine was prepped. The skin was anesthetized with lidocaine.  Free flow of clear CSF was obtained prior to injecting local anesthetic into the CSF.  The spinal needle aspirated freely following injection.  The needle was carefully withdrawn.  The patient tolerated the procedure well.  Lauretta Grill, MD

## 2015-03-27 ENCOUNTER — Encounter (HOSPITAL_COMMUNITY): Payer: Self-pay | Admitting: Orthopedic Surgery

## 2015-03-27 DIAGNOSIS — E669 Obesity, unspecified: Secondary | ICD-10-CM | POA: Diagnosis present

## 2015-03-27 LAB — CBC
HEMATOCRIT: 39 % (ref 36.0–46.0)
HEMOGLOBIN: 12.7 g/dL (ref 12.0–15.0)
MCH: 30.9 pg (ref 26.0–34.0)
MCHC: 32.6 g/dL (ref 30.0–36.0)
MCV: 94.9 fL (ref 78.0–100.0)
PLATELETS: 208 10*3/uL (ref 150–400)
RBC: 4.11 MIL/uL (ref 3.87–5.11)
RDW: 13.5 % (ref 11.5–15.5)
WBC: 9.8 10*3/uL (ref 4.0–10.5)

## 2015-03-27 LAB — BASIC METABOLIC PANEL
ANION GAP: 7 (ref 5–15)
BUN: 18 mg/dL (ref 6–20)
CO2: 26 mmol/L (ref 22–32)
Calcium: 8.5 mg/dL — ABNORMAL LOW (ref 8.9–10.3)
Chloride: 107 mmol/L (ref 101–111)
Creatinine, Ser: 0.76 mg/dL (ref 0.44–1.00)
GFR calc Af Amer: >60 mL/min (ref >60)
GLUCOSE: 137 mg/dL — AB (ref 65–99)
POTASSIUM: 4.6 mmol/L (ref 3.5–5.1)
SODIUM: 140 mmol/L (ref 135–145)

## 2015-03-27 MED ORDER — PROMETHAZINE HCL 12.5 MG PO TABS: 12.5000 mg | ORAL_TABLET | Freq: Four times a day (QID) | ORAL | Status: DC | PRN

## 2015-03-27 MED ORDER — HYDROCODONE-ACETAMINOPHEN 7.5-325 MG PO TABS
1.0000 | ORAL_TABLET | ORAL | Status: DC | PRN
Start: 2015-03-27 — End: 2016-05-22

## 2015-03-27 MED ORDER — PROMETHAZINE HCL 25 MG/ML IJ SOLN
12.5000 mg | Freq: Once | INTRAMUSCULAR | Status: AC
  Administered 2015-03-27: 12.5 mg via INTRAVENOUS
  Filled 2015-03-27: qty 1

## 2015-03-27 MED ORDER — FERROUS SULFATE 325 (65 FE) MG PO TABS: 325.0000 mg | ORAL_TABLET | Freq: Three times a day (TID) | ORAL | Status: DC

## 2015-03-27 MED ORDER — ASPIRIN 325 MG PO TBEC
325.0000 mg | DELAYED_RELEASE_TABLET | Freq: Two times a day (BID) | ORAL | Status: AC
Start: 2015-03-27 — End: 2015-04-24

## 2015-03-27 MED ORDER — POLYETHYLENE GLYCOL 3350 17 G PO PACK: 17.0000 g | PACK | Freq: Two times a day (BID) | ORAL | Status: DC

## 2015-03-27 MED ORDER — DOCUSATE SODIUM 100 MG PO CAPS: 100.0000 mg | ORAL_CAPSULE | Freq: Two times a day (BID) | ORAL | Status: DC

## 2015-03-27 MED ORDER — METHOCARBAMOL 500 MG PO TABS: 500.0000 mg | ORAL_TABLET | Freq: Four times a day (QID) | ORAL | Status: DC | PRN

## 2015-03-27 NOTE — Discharge Instructions (Signed)

## 2015-03-27 NOTE — Evaluation (Signed)
Occupational Therapy Evaluation Patient Details Name: Carol Cox MRN: [id number] DOB: Mar 30, 1951 Today's Date: 03/27/2015    History of Present Illness s/p L TKA   Clinical Impression   This 64 year old female was admitted for the above surgery. All education was completed.  No further OT is needed at this time.    Follow Up Recommendations  No OT follow up    Equipment Recommendations  3 in 1 bedside comode (delivered)    Recommendations for Other Services       Precautions / Restrictions Precautions Precautions: Knee Restrictions Weight Bearing Restrictions: No Other Position/Activity Restrictions: WBAT      Mobility Bed Mobility Overal bed mobility: Needs Assistance Bed Mobility: Supine to Sit     Supine to sit: Modified independent (Device/Increase time)        Transfers Overall transfer level: Needs assistance Equipment used: Rolling walker (2 wheeled) Transfers: Sit to/from Stand Sit to Stand: Modified independent (Device/Increase time)         General transfer comment: cues for hand placement    Balance                                            ADL Overall ADL's : Needs assistance/impaired     Grooming: Oral care;Supervision/safety;Sitting   Upper Body Bathing: Set up;Sitting   Lower Body Bathing: Minimal assistance;Sit to/from stand   Upper Body Dressing : Set up;Sitting   Lower Body Dressing: Minimal assistance;Sit to/from stand   Toilet Transfer: Min guard;Ambulation;BSC   Toileting- Water quality scientist and Hygiene: Supervision/safety;Sit to/from stand   Tub/ Shower Transfer: Walk-in shower;Min guard;3 in 1;Ambulation     General ADL Comments: Husband will assist as needed; no need for AE.  Pt managed leg in and out of bed without assistance.     Vision     Perception     Praxis      Pertinent Vitals/Pain Pain Assessment: 0-10 Pain Score: 3  Pain Location: L knee Pain Descriptors /  Indicators: Aching Pain Intervention(s): Limited activity within patient's tolerance;Monitored during session;Premedicated before session;Repositioned;Ice applied     Hand Dominance     Extremity/Trunk Assessment Upper Extremity Assessment Upper Extremity Assessment: Overall WFL for tasks assessed          Communication Communication Communication: No difficulties   Cognition Arousal/Alertness: Awake/alert Behavior During Therapy: WFL for tasks assessed/performed Overall Cognitive Status: Within Functional Limits for tasks assessed                     General Comments       Exercises       Shoulder Instructions      Home Living Family/patient expects to be discharged to:: Private residence Living Arrangements: Spouse/significant other Available Help at Discharge: Family Type of Home: House       Home Layout: Two level;Able to live on main level with bedroom/bathroom Alternate Level Stairs-Number of Steps: 3   Bathroom Shower/Tub: Occupational psychologist: Standard     Home Equipment: Cane - single point;Wheelchair - manual   Additional Comments: 3:1 was delivered      Prior Functioning/Environment Level of Independence: Independent             OT Diagnosis: Generalized weakness   OT Problem List:     OT Treatment/Interventions:      OT Goals(Current goals  can be found in the care plan section) Acute Rehab OT Goals Patient Stated Goal: get back to life with less pain  OT Frequency:     Barriers to D/C:            Co-evaluation              End of Session    Activity Tolerance: Patient tolerated treatment well;No increased pain Patient left: in bed;with call bell/phone within reach;with family/visitor present   Time: 1016-1030 OT Time Calculation (min): 14 min Charges:  OT General Charges $OT Visit: 1 Procedure OT Evaluation $Initial OT Evaluation Tier I: 1 Procedure G-Codes:    Saleena Tamas Apr 09, 2015,  10:59 AM  Lesle Chris, OTR/L [phone] 04-09-15

## 2015-03-27 NOTE — Evaluation (Signed)
Physical Therapy Evaluation Patient Details Name: Carol Cox MRN: [id number] DOB: 10/19/1951 Today's Date: 03/27/2015   History of Present Illness  s/p L TKA  Clinical Impression  Patient evaluated by Physical Therapy with no further acute PT needs identified. All education has been completed and the patient has no further questions.  See below for any follow-up Physial Therapy or equipment needs. PT is signing off. Thank you for this referral.  Pt will need RW and HHPT; pain well controlled; all education completed; pt should continue to progress very well     Follow Up Recommendations Home health PT    Equipment Recommendations  Rolling walker with 5" wheels    Recommendations for Other Services       Precautions / Restrictions Precautions Precautions: Knee Restrictions Weight Bearing Restrictions: No Other Position/Activity Restrictions: WBAT      Mobility  Bed Mobility Overal bed mobility: Needs Assistance Bed Mobility: Supine to Sit     Supine to sit: Modified independent (Device/Increase time)        Transfers Overall transfer level: Needs assistance Equipment used: Rolling walker (2 wheeled) Transfers: Sit to/from Stand Sit to Stand: Supervision         General transfer comment: cues for hand placement  Ambulation/Gait Ambulation/Gait assistance: Min guard;Supervision Ambulation Distance (Feet): 180 Feet (x2) Assistive device: Rolling walker (2 wheeled) Gait Pattern/deviations: Step-to pattern;Antalgic     General Gait Details: cues for step through, upward gaze  Stairs Stairs: Yes Stairs assistance: Min guard Stair Management: With crutches Number of Stairs: 4 General stair comments: cues for sequence  Wheelchair Mobility    Modified Rankin (Stroke Patients Only)       Balance                                             Pertinent Vitals/Pain Pain Assessment: 0-10 Pain Score: 3  Pain Location: L knee Pain  Descriptors / Indicators: Aching Pain Intervention(s): Limited activity within patient's tolerance;Premedicated before session;Repositioned;Ice applied;Monitored during session    [place] expects to be discharged to:: Private residence Living Arrangements: Spouse/significant other   Type of Home: [place]: Two level;Able to live on main level with bedroom/bathroom Home Equipment: Kasandra Knudsen - single point;Wheelchair - manual      Prior Function Level of Independence: Independent               Hand Dominance        Extremity/Trunk Assessment   Upper Extremity Assessment: Defer to OT evaluation           Lower Extremity Assessment: LLE deficits/detail   LLE Deficits / Details: A/AROM grossly 5 to 80*; hip flexion and knee extension grossly 3/5     Communication   Communication: No difficulties  Cognition Arousal/Alertness: Awake/alert Behavior During Therapy: WFL for tasks assessed/performed Overall Cognitive Status: Within Functional Limits for tasks assessed                      General Comments      Exercises Total Joint Exercises Ankle Circles/Pumps: AROM;Both;10 reps Quad Sets: 10 reps;Supine;Both Heel Slides: AAROM;AROM;Left;10 reps Hip ABduction/ADduction: 10 reps;AROM;Left Straight Leg Raises: AROM;Left;10 reps Goniometric ROM: 5 to 80* AROM      Assessment/Plan    PT Assessment All further PT needs can be met in the  next venue of care  PT Diagnosis Difficulty walking   PT Problem List    PT Treatment Interventions     PT Goals (Current goals can be found in the Care Plan section) Acute Rehab PT Goals Patient Stated Goal: get back to life with less pain PT Goal Formulation: All assessment and education complete, DC therapy    Frequency     Barriers to discharge        Co-evaluation               End of Session Equipment Utilized During Treatment: Gait belt Activity Tolerance: Patient  tolerated treatment well;No increased pain Patient left: in chair;with call bell/phone within reach;with family/visitor present Nurse Communication: Mobility status         Time: 7022-0266 PT Time Calculation (min) (ACUTE ONLY): 47 min   Charges:   PT Evaluation $Initial PT Evaluation Tier I: 1 Procedure PT Treatments $Gait Training: 8-22 mins $Therapeutic Exercise: 8-22 mins   PT G Codes:        Carol Cox 17-Apr-2015, 9:51 AM

## 2015-03-27 NOTE — Progress Notes (Signed)
     Subjective: 1 Day Post-Op Procedure(s) (LRB): LEFT TOTAL KNEE ARTHROPLASTY (Left)   Patient reports pain as mild.  Objective:   VITALS:   Filed Vitals:   03/27/15 0631  BP: 101/53  Pulse: 59  Temp: 98 F (36.7 C)  Resp: 16    Dorsiflexion/Plantar flexion intact Incision: dressing C/D/I No cellulitis present Compartment soft  LABS  Recent Labs  03/27/15 0412  HGB 12.7  HCT 39.0  WBC 9.8  PLT 208     Recent Labs  03/27/15 0412  NA 140  K 4.6  BUN 18  CREATININE 0.76  GLUCOSE 137*     Assessment/Plan: 1 Day Post-Op Procedure(s) (LRB): LEFT TOTAL KNEE ARTHROPLASTY (Left) Foley cath d/c'ed Advance diet Up with therapy D/C IV fluids Discharge home with home health, if nausea and pain stays controlled Follow up in 2 weeks at [facility]. Follow up with OLIN,Nigel Wessman D in 2 weeks.  Contact information:  [facility] [address] [place] [phone]    Obese (BMI 30-39.9) Estimated body mass index is 32.78 kg/(m^2) as calculated from the following:   Height as of this encounter: 5\' 5"  (1.651 m).   Weight as of this encounter: 89.359 kg (197 lb). Patient also counseled that weight may inhibit the healing process Patient counseled that losing weight will help with future health issues      West Pugh. Hesston Hitchens   PAC  03/27/2015, 8:24 AM

## 2015-03-27 NOTE — Care Management Note (Signed)
Case Management Note  Patient Details  Name: Carol Cox MRN: [id number] Date of Birth: 11/28/1950  Subjective/Objective:                   LEFT TOTAL KNEE ARTHROPLASTY (Left)  Action/Plan:  Discharge planning Expected Discharge Date:  03/27/15               Expected Discharge Plan:  Home w Home Health Services  In-House Referral:     Discharge planning Services  CM Consult  Post Acute Care Choice:  Home Health Choice offered to:  Patient  DME Arranged:  3-N-1, Walker rolling DME Agency:  [facility]  HH Arranged:  PT HH Agency:  [facility]  Status of Service:  Completed, signed off  Medicare Important Message Given:    Date Medicare IM Given:    Medicare IM give by:    Date Additional Medicare IM Given:    Additional Medicare Important Message give by:     If discussed at Long Length of Stay Meetings, dates discussed:    Additional Comments: CM met with pt in room to offer choice of home health agency.  Pt chooses [facility] to render HHPT.  Address and contact information verified by pt.  Referral called to [facility] rep, Mary.  Cm called [facility] DME rep Lecretia to please deliver the 3n1 and rolling walker to room prior to discharge.  No other CM needs were communicated.  ,  Christine, RN 03/27/2015, 11:28 AM  

## 2015-03-29 NOTE — Discharge Summary (Signed)
Physician Discharge Summary  Patient ID: Carol Cox MRN: [id number] DOB/AGE: 63-28-1952 63 y.o.  Admit date: 03/26/2015 Discharge date: 03/27/2015   Procedures:  Procedure(s) (LRB): LEFT TOTAL KNEE ARTHROPLASTY (Left)  Attending Physician:  Dr. Paralee Cancel   Admission Diagnoses:   Left knee primary OA / pain  Discharge Diagnoses:  Principal Problem:   S/P left TKA Active Problems:   S/P knee replacement   Obese  Past Medical History  Diagnosis Date  . Hyperlipidemia   . Arthritis     "joints of left knee, left hip, ankles" (02/04/2013)  . CAD (coronary artery disease)     Mild nonobstructive CAD by cath 02/06/13 (25% mid LAD), normal EF  . PONV (postoperative nausea and vomiting)   . Family history of adverse reaction to anesthesia     mother has problems with nausea and vomiting     HPI:    Carol Cox, 64 y.o. female, has a history of pain and functional disability in the left knee due to arthritis and has failed non-surgical conservative treatments for greater than 12 weeks to include NSAID's and/or analgesics, corticosteriod injections, viscosupplementation injections and activity modification. Onset of symptoms was gradual, starting 2+ years ago with gradually worsening course since that time. The patient noted prior procedures on the knee to include arthroscopy and menisectomy on the left knee in October 2015. Patient currently rates pain in the left knee(s) at 10 out of 10 with activity. Patient has night pain, worsening of pain with activity and weight bearing, pain that interferes with activities of daily living, pain with passive range of motion, crepitus and joint swelling. Patient has evidence of periarticular osteophytes and joint space narrowing by imaging studies. Risks, benefits and expectations were discussed with the patient. Risks including but not limited to the risk of anesthesia, blood clots, nerve damage, blood vessel damage, failure of the  prosthesis, infection and up to and including death. Patient understand the risks, benefits and expectations and wishes to proceed with surgery.  PCP: Carol Seller, MD   Discharged Condition: good  Hospital Course:  Patient underwent the above stated procedure on 03/26/2015. Patient tolerated the procedure well and brought to the recovery room in good condition and subsequently to the floor.  POD #1 BP: 101/53 ; Pulse: 59 ; Temp: 98 F (36.7 C) ; Resp: 16 Patient reports pain as mild. No events throughout the night.  Ready to be discharged home. Dorsiflexion/plantar flexion intact, incision: dressing C/D/I, no cellulitis present and compartment soft.   LABS  Basename    HGB  12.7  HCT  39.0    Discharge Exam: General appearance: alert, cooperative and no distress Extremities: Homans sign is negative, no sign of DVT, no edema, redness or tenderness in the calves or thighs and no ulcers, gangrene or trophic changes  Disposition: Home with follow up in 2 weeks   Follow-up Information    Follow up with Mauri Pole, MD. Schedule an appointment as soon as possible for a visit in 2 weeks.   Specialty:  Orthopedic Surgery   Contact information:   [address] [place] [address] [phone]       Follow up with [facility].   Why:  home health physical therapy   Contact information:   [place] [address] [phone]       Follow up with [facility].   Why:  3n1 (over the commode seat)  and rolling walker   Contact information:   [address] [place] [address] [phone]       Discharge Instructions    Call MD / Call 911    Complete by:  As directed   If you experience chest pain or shortness of breath, CALL 911 and be transported to the hospital emergency room.  If you develope a fever above 101 F, pus (white drainage) or increased drainage or redness at the wound, or calf  pain, call your surgeon's office.     Change dressing    Complete by:  As directed   Maintain surgical dressing until follow up in the clinic. If the edges start to pull up, may reinforce with tape. If the dressing is no longer working, may remove and cover with gauze and tape, but must keep the area dry and clean.  Call with any questions or concerns.     Constipation Prevention    Complete by:  As directed   Drink plenty of fluids.  Prune juice may be helpful.  You may use a stool softener, such as Colace (over the counter) 100 mg twice a day.  Use MiraLax (over the counter) for constipation as needed.     Diet - low sodium heart healthy    Complete by:  As directed      Discharge instructions    Complete by:  As directed   Maintain surgical dressing until follow up in the clinic. If the edges start to pull up, may reinforce with tape. If the dressing is no longer working, may remove and cover with gauze and tape, but must keep the area dry and clean.  Follow up in 2 weeks at [facility]. Call with any questions or concerns.     Increase activity slowly as tolerated    Complete by:  As directed      TED hose    Complete by:  As directed   Use stockings (TED hose) for 2 weeks on both leg(s).  You may remove them at night for sleeping.     Weight bearing as tolerated    Complete by:  As directed   Laterality:  left  Extremity:  Lower             Medication List    STOP taking these medications        aspirin 81 MG tablet  Replaced by:  aspirin 325 MG EC tablet     ciprofloxacin 500 MG tablet  Commonly known as:  CIPRO     diclofenac 75 MG EC tablet  Commonly known as:  VOLTAREN     ibuprofen 200 MG tablet  Commonly known as:  ADVIL,MOTRIN     oxyCODONE-acetaminophen 10-325 MG per tablet  Commonly known as:  PERCOCET      TAKE these medications        aspirin 325 MG EC tablet  Take 1 tablet (325 mg total) by mouth 2 (two) times daily.     cetirizine 10  MG tablet  Commonly known as:  ZYRTEC  Take 10 mg by mouth as needed for allergies.     COQ10 PO  Take 1 capsule by mouth daily.     docusate sodium 100 MG capsule  Commonly known as:  COLACE  Take 1 capsule (100 mg total) by mouth 2 (two) times daily.     ezetimibe-simvastatin 10-40 MG per tablet  Commonly known as:  VYTORIN  Take 1 tablet by mouth  at bedtime.     ferrous sulfate 325 (65 FE) MG tablet  Take 1 tablet (325 mg total) by mouth 3 (three) times daily after meals.     fish oil-omega-3 fatty acids 1000 MG capsule  Take 1 g by mouth daily.     HYDROcodone-acetaminophen 7.5-325 MG per tablet  Commonly known as:  NORCO  Take 1-2 tablets by mouth every 4 (four) hours as needed for moderate pain.     methocarbamol 500 MG tablet  Commonly known as:  ROBAXIN  Take 1 tablet (500 mg total) by mouth every 6 (six) hours as needed for muscle spasms.     METROGEL 1 % gel  Generic drug:  metroNIDAZOLE  Apply 1 application topically daily.     multivitamin tablet  Take 1 tablet by mouth daily.     nitroGLYCERIN 0.4 MG SL tablet  Commonly known as:  NITROSTAT  Place 1 tablet (0.4 mg total) under the tongue every 5 (five) minutes as needed for chest pain (up to 3 doses).     polyethylene glycol packet  Commonly known as:  MIRALAX / GLYCOLAX  Take 17 g by mouth 2 (two) times daily.     promethazine 12.5 MG tablet  Commonly known as:  PHENERGAN  Take 1 tablet (12.5 mg total) by mouth every 6 (six) hours as needed for nausea or vomiting.     RED YEAST RICE PO  Take 1 tablet by mouth 3 (three) times a week. No particular day         Signed: West Pugh. Ashaun Gaughan   PA-C  03/29/2015, 12:45 PM

## 2015-04-03 ENCOUNTER — Encounter: Payer: Self-pay | Admitting: Internal Medicine

## 2015-04-15 ENCOUNTER — Ambulatory Visit: Payer: BC Managed Care – PPO | Attending: Orthopedic Surgery | Admitting: Physical Therapy

## 2015-04-15 DIAGNOSIS — M25562 Pain in left knee: Secondary | ICD-10-CM

## 2015-04-15 DIAGNOSIS — M25662 Stiffness of left knee, not elsewhere classified: Secondary | ICD-10-CM | POA: Diagnosis not present

## 2015-04-15 NOTE — Therapy (Signed)
[facility] [place], [place], [address] Phone: [phone]   Fax:  [phone]  Physical Therapy Evaluation  Patient Details  Name: Carol Cox MRN: [id number] Date of Birth: 1951-06-04 Referring Provider:  Paralee Cancel, MD  Encounter Date: 04/15/2015      PT End of Session - 04/15/15 1146    Visit Number 1   Number of Visits 12   Date for PT Re-Evaluation 05/27/15   PT Start Time 1032   PT Stop Time 1129   PT Time Calculation (min) 57 min   Activity Tolerance Patient tolerated treatment well   Behavior During Therapy [facility] for tasks assessed/performed      Past Medical History  Diagnosis Date  . Hyperlipidemia   . Arthritis     "joints of left knee, left hip, ankles" (02/04/2013)  . CAD (coronary artery disease)     Mild nonobstructive CAD by cath 02/06/13 (25% mid LAD), normal EF  . PONV (postoperative nausea and vomiting)   . Family history of adverse reaction to anesthesia     mother has problems with nausea and vomiting     Past Surgical History  Procedure Laterality Date  . Carpal tunnel release Bilateral 1990's  . Left heart cath  02/06/13  . Left heart catheterization with coronary angiogram N/A 02/06/2013    Procedure: LEFT HEART CATHETERIZATION WITH CORONARY ANGIOGRAM;  Surgeon: [name] Hampshire, MD;  Location: [facility] CATH LAB;  Service: Cardiovascular;  Laterality: N/A;  . Left knee meniscus surgery     . Total knee arthroplasty Left 03/26/2015    Procedure: LEFT TOTAL KNEE ARTHROPLASTY;  Surgeon: Paralee Cancel, MD;  Location: [facility] ORS;  Service: Orthopedics;  Laterality: Left;    There were no vitals filed for this visit.  Visit Diagnosis:  Left knee pain - Plan: PT plan of care cert/re-cert  Knee stiffness, left - Plan: PT plan of care cert/re-cert      Subjective Assessment - 04/15/15 1210    Subjective I wish my was better.   Limitations Walking   How long can you walk comfortably? 20 minutes.    Pain Score 2    Pain Location Knee   Pain Orientation Left   Pain Descriptors / Indicators Aching;Constant   Pain Type Surgical pain   Pain Frequency Intermittent            [facility] PT Assessment - 04/15/15 0001    Assessment   Medical Diagnosis S/p left total knee replacement.   Onset Date/Surgical Date --  03/26/15.   Precautions   Precautions --  No ultrasound.   Restrictions   Weight Bearing Restrictions No   Balance Screen   Has the patient fallen in the past 6 months No   Has the patient had a decrease in activity level because of a fear of falling?  Yes   Is the patient reluctant to leave their home because of a fear of falling?  No   Home Ecologist residence   Prior Function   Level of Independence Independent   Cognition   Overall Cognitive Status Within Functional Limits for tasks assessed   Observation/Other Assessments-Edema    Edema Circumferential   Circumferential Edema   Circumferential - Left  Left 7 cms > right.   ROM / Strength   AROM / PROM / Strength AROM;PROM;Strength   AROM   Overall AROM Comments -11 degrees to 68 degrees   PROM   Overall PROM  Comments -8 degrees to 72 degrees.   Strength   Overall Strength Comments Left knee strength  4 to 4+/5.   Palpation   Palpation comment --  Diffuse anterior left knee pain.   Transfers   Transfers --  Independent.   Ambulation/Gait   Gait Comments Mild gait antalgia without assistive device.                   [facility] Adult PT Treatment/Exercise - 04/15/15 0001    Exercises   Exercises Knee/Hip   Knee/Hip Exercises: Aerobic   Stationary Bike --  Nustep level 3x 10 mins moving seat forward x 1 to inc flex    Modalities   Modalities Cryotherapy;Electrical Stimulation   Cryotherapy   Number Minutes Cryotherapy 15 Minutes   Cryotherapy Location --  Left knee.   Type of Cryotherapy --  Medium vasopneumatic.   Civil engineer, contracting Location --  Left knee.   Electrical Stimulation Action --  IFC at 1-10 HZ x 15 minutes.   Electrical Stimulation Goals Edema;Pain                     PT Long Term Goals - 04/15/15 1224    PT LONG TERM GOAL #1   Title Ind with HEP.   Time 4   Period Weeks   Status New   PT LONG TERM GOAL #2   Title Full activeleft knee extension to normalize gait.   Time 4   Period Weeks   Status New   PT LONG TERM GOAL #3   Title Active left knee flexion to 115 degrees to improve function.   Time 4   Period Weeks   Status New   PT LONG TERM GOAL #4   Title 5/5 left knee strength to increase stability for functional activites.   Time 4   Period Weeks   Status New   PT LONG TERM GOAL #5   Title Perform ADL's with pain not > 2-3/10.   Time 4   Period Weeks   Status New   Additional Long Term Goals   Additional Long Term Goals Yes   PT LONG TERM GOAL #6   Title Perform a reciprocating stair gait with pain not > 2-3/10.   Time 4   Period Weeks               Plan - 04/15/15 1150    Clinical Impression Statement The patient underwent a left total knee replacement on 03/26/15.  She has no pain at rest and pain to 5+/10 with range of motion activites.  She had HH PT and is compliant to her HEP.     Pt will benefit from skilled therapeutic intervention in order to improve on the following deficits Pain;Decreased activity tolerance;Decreased range of motion;Decreased strength   Rehab Potential Good   PT Frequency 3x / week   PT Duration 4 weeks   PT Treatment/Interventions ADLs/Self Care Home Management;Cryotherapy;Electrical Stimulation;Gait training;Neuromuscular re-education;Vasopneumatic Device;Patient/family education;Passive range of motion;Therapeutic exercise   PT Next Visit Plan Left TKA protocol.   Consulted and Agree with Plan of Care Patient         Problem List Patient Active Problem List   Diagnosis Date Noted  . Obese 03/27/2015  . S/P  left TKA 03/26/2015  . S/P knee replacement 03/26/2015  . Colitis, acute 12/01/2014  . Weight loss, unintentional 12/01/2014  . Adnexal cyst 12/01/2014  . Elevated fasting glucose 12/01/2014  .  CAD (coronary artery disease) 12/01/2014  . Osteoarthritis 12/01/2014  . Vasovagal attack 02/28/2013  . Hyperlipidemia 02/28/2013    [place], [place] MPT 04/15/2015, 12:29 PM  [facility] [address] [place], [place], [address] Phone: [phone]   Fax:  [phone]

## 2015-04-17 ENCOUNTER — Encounter: Payer: Self-pay | Admitting: Physical Therapy

## 2015-04-17 ENCOUNTER — Ambulatory Visit: Payer: BC Managed Care – PPO | Admitting: Physical Therapy

## 2015-04-17 DIAGNOSIS — M25562 Pain in left knee: Secondary | ICD-10-CM

## 2015-04-17 DIAGNOSIS — M25662 Stiffness of left knee, not elsewhere classified: Secondary | ICD-10-CM

## 2015-04-17 NOTE — Patient Instructions (Signed)
Knee Extension Mobilization: Towel Prop   With rolled towel under right ankle, place _1-5___ pound weight across knee. Hold __5+__ minutes. Repeat __2-3__ times per set. Do __2__ sets per session. Do __2-4__ sessions per day.  Sitting knee extension stretch    Place one foot on table. Straighten leg and attempt to keep it straight, then push down until feel a stretch. Hold _30__ seconds. Repeat __5-10_ times each leg, alternating. Do _2-4__ sessions per day.      Knee Flexion Stretch on Step  Place foot on step and lean forward until you feel a good stretch in front of knee.   hold 30 sec x 5-10 perform 2-4 x daily

## 2015-04-17 NOTE — Therapy (Signed)
[facility] [place], [place], [address] Phone: [phone]   Fax:  [phone]  Physical Therapy Treatment  Patient Details  Name: Carol Cox MRN: [id number] Date of Birth: 02-06-1951 Referring Provider:  Christain Sacramento, MD  Encounter Date: 04/17/2015      PT End of Session - 04/17/15 1151    Visit Number 2   Number of Visits 12   Date for PT Re-Evaluation 05/27/15   PT Start Time 1118   PT Stop Time 1208   PT Time Calculation (min) 50 min   Activity Tolerance Patient tolerated treatment well   Behavior During Therapy [facility] for tasks assessed/performed      Past Medical History  Diagnosis Date  . Hyperlipidemia   . Arthritis     "joints of left knee, left hip, ankles" (02/04/2013)  . CAD (coronary artery disease)     Mild nonobstructive CAD by cath 02/06/13 (25% mid LAD), normal EF  . PONV (postoperative nausea and vomiting)   . Family history of adverse reaction to anesthesia     mother has problems with nausea and vomiting     Past Surgical History  Procedure Laterality Date  . Carpal tunnel release Bilateral 1990's  . Left heart cath  02/06/13  . Left heart catheterization with coronary angiogram N/A 02/06/2013    Procedure: LEFT HEART CATHETERIZATION WITH CORONARY ANGIOGRAM;  Surgeon: [name] Hampshire, MD;  Location: [place] CATH [facility];  Service: Cardiovascular;  Laterality: N/A;  . Left knee meniscus surgery     . Total knee arthroplasty Left 03/26/2015    Procedure: LEFT TOTAL KNEE ARTHROPLASTY;  Surgeon: Paralee Cancel, MD;  Location: [facility];  Service: Orthopedics;  Laterality: Left;    There were no vitals filed for this visit.  Visit Diagnosis:  Left knee pain  Knee stiffness, left      Subjective Assessment - 04/17/15 1121    Subjective has not done exercises today, stiff knee   Limitations Walking   How long can you walk comfortably? 20 minutes.   Currently in Pain? Yes   Pain Score 3    Pain  Location Knee   Pain Orientation Left   Pain Descriptors / Indicators Sore;Aching  stiff   Pain Type Surgical pain   Pain Frequency Intermittent   Aggravating Factors  ROM and increased activity   Pain Relieving Factors rest            [facility] PT Assessment - 04/17/15 0001    ROM / Strength   AROM / PROM / Strength PROM;AROM   AROM   Overall AROM  Deficits   AROM Assessment Site Knee   Right/Left Knee Left   Left Knee Extension -10   Left Knee Flexion 75   PROM   Overall PROM  Deficits   PROM Assessment Site Knee   Right/Left Knee Left   Left Knee Extension -5   Left Knee Flexion 85                     [facility] Adult PT Treatment/Exercise - 04/17/15 0001    Knee/Hip Exercises: Stretches   Knee: Self-Stretch to increase Flexion Left;3 reps;30 seconds   Knee/Hip Exercises: Aerobic   Stationary Bike Nustep L4x66min for ROM   Knee/Hip Exercises: Standing   Rocker Board 3 minutes   Cryotherapy   Number Minutes Cryotherapy 15 Minutes   Cryotherapy Location Knee   Type of Cryotherapy --  vasopnumatix   Electrical Stimulation  Electrical Stimulation Location left knee   Electrical Stimulation Action IFC   Electrical Stimulation Parameters 1-10HZ    Electrical Stimulation Goals Edema;Pain   Manual Therapy   Manual Therapy Passive ROM   Passive ROM low load holds for knee flexion and ext                PT Education - 04/17/15 1123    Education provided Yes   Education Details HEP   Person(s) Educated Patient   Methods Explanation;Demonstration;Handout   Comprehension Verbalized understanding;Returned demonstration             PT Long Term Goals - 04/17/15 1156    PT LONG TERM GOAL #1   Title Ind with HEP.   Time 4   Period Weeks   Status On-going   PT LONG TERM GOAL #2   Title Full activeleft knee extension to normalize gait.   Time 4   Period Weeks   Status On-going  -10 degrees   PT LONG TERM GOAL #3   Title Active left knee  flexion to 115 degrees to improve function.   Time 4   Period Weeks   Status On-going  75 degrees   PT LONG TERM GOAL #4   Title 5/5 left knee strength to increase stability for functional activites.   Time 4   Period Weeks   Status On-going   PT LONG TERM GOAL #5   Title Perform ADL's with pain not > 2-3/10.   Time 4   Period Weeks   Status On-going   PT LONG TERM GOAL #6   Title Perform a reciprocating stair gait with pain not > 2-3/10.   Time 4   Period Weeks   Status On-going               Plan - 04/17/15 1153    Clinical Impression Statement Patient progressing with activities. Improved knee ext ROM today. Has little pain at rest and increased pain with ROM and activity. HEP given for Knee ROM and educated patient on self streching and then rest ice and elevation. Goals ongoing due to ROM, pain and strength limitations.   Pt will benefit from skilled therapeutic intervention in order to improve on the following deficits Pain;Decreased activity tolerance;Decreased range of motion;Decreased strength   Rehab Potential Good   PT Frequency 3x / week   PT Duration 4 weeks   PT Treatment/Interventions ADLs/Self Care Home Management;Cryotherapy;Electrical Stimulation;Gait training;Neuromuscular re-education;Vasopneumatic Device;Patient/family education;Passive range of motion;Therapeutic exercise   PT Next Visit Plan Left TKA protocol.   Consulted and Agree with Plan of Care Patient        Problem List Patient Active Problem List   Diagnosis Date Noted  . Obese 03/27/2015  . S/P left TKA 03/26/2015  . S/P knee replacement 03/26/2015  . Colitis, acute 12/01/2014  . Weight loss, unintentional 12/01/2014  . Adnexal cyst 12/01/2014  . Elevated fasting glucose 12/01/2014  . CAD (coronary artery disease) 12/01/2014  . Osteoarthritis 12/01/2014  . Vasovagal attack 02/28/2013  . Hyperlipidemia 02/28/2013    Phillips Climes, PTA 04/17/2015, 12:08 PM  [facility] [address] [place], [place], [address] Phone: [phone]   Fax:  [phone]

## 2015-04-19 ENCOUNTER — Ambulatory Visit: Payer: BC Managed Care – PPO | Admitting: Physical Therapy

## 2015-04-19 ENCOUNTER — Encounter: Payer: Self-pay | Admitting: Physical Therapy

## 2015-04-19 DIAGNOSIS — M25562 Pain in left knee: Secondary | ICD-10-CM

## 2015-04-19 DIAGNOSIS — M25662 Stiffness of left knee, not elsewhere classified: Secondary | ICD-10-CM

## 2015-04-19 NOTE — Therapy (Signed)
[facility] [place], [place], [address] Phone: [phone]   Fax:  [phone]  Physical Therapy Treatment  Patient Details  Name: Carol Cox MRN: [id number] Date of Birth: [birth year] Referring Provider:  Christain Sacramento, MD  Encounter Date: 04/19/2015      PT End of Session - 04/19/15 1146    Visit Number 3   Number of Visits 12   Date for PT Re-Evaluation 05/27/15   PT Start Time 1121   PT Stop Time 1201   PT Time Calculation (min) 40 min   Activity Tolerance Patient tolerated treatment well   Behavior During Therapy [facility] for tasks assessed/performed      Past Medical History  Diagnosis Date  . Hyperlipidemia   . Arthritis     "joints of left knee, left hip, ankles" (02/04/2013)  . CAD (coronary artery disease)     Mild nonobstructive CAD by cath 02/06/13 (25% mid LAD), normal EF  . PONV (postoperative nausea and vomiting)   . Family history of adverse reaction to anesthesia     mother has problems with nausea and vomiting     Past Surgical History  Procedure Laterality Date  . Carpal tunnel release Bilateral 1990's  . Left heart cath  02/06/13  . Left heart catheterization with coronary angiogram N/A 02/06/2013    Procedure: LEFT HEART CATHETERIZATION WITH CORONARY ANGIOGRAM;  Surgeon: [name] Hampshire, MD;  Location: [place] [facility];  Service: Cardiovascular;  Laterality: N/A;  . Left knee meniscus surgery     . Total knee arthroplasty Left 03/26/2015    Procedure: LEFT TOTAL KNEE ARTHROPLASTY;  Surgeon: Paralee Cancel, MD;  [facility];  Service: Orthopedics;  Laterality: Left;    There were no vitals filed for this visit.  Visit Diagnosis:  Left knee pain  Knee stiffness, left      Subjective Assessment - 04/19/15 1124    Subjective very sore and stiff today   Limitations Walking   How long can you walk comfortably? 20 minutes.   Currently in Pain? Yes   Pain Score 5    Pain Location Knee   Pain Orientation Left   Pain Descriptors / Indicators Aching;Sore   Pain Type Surgical pain   Pain Onset 1 to 4 weeks ago   Pain Frequency Intermittent   Aggravating Factors  ROM and activity   Pain Relieving Factors rest            [facility] PT Assessment - 04/19/15 0001    AROM   Overall AROM  Deficits   AROM Assessment Site Knee   Right/Left Knee Left   Left Knee Extension -8   Left Knee Flexion 80   PROM   Overall PROM  Deficits   PROM Assessment Site Knee   Right/Left Knee Left   Left Knee Extension -4   Left Knee Flexion 90                     [facility] Adult PT Treatment/Exercise - 04/19/15 0001    Knee/Hip Exercises: Aerobic   Stationary Bike Nustep L4x4min for ROM   Cryotherapy   Number Minutes Cryotherapy 15 Minutes   Cryotherapy Location Knee   Type of Cryotherapy --  vasopnumatic   Electrical Stimulation   Electrical Stimulation Location left knee   Electrical Stimulation Action IFC   Electrical Stimulation Parameters 1-10HZ    Electrical Stimulation Goals Edema;Pain   Manual Therapy   Manual Therapy Passive ROM  Passive ROM low load holds for knee flexion and ext, gentle scar mobilization                     PT Long Term Goals - 04/19/15 1153    PT LONG TERM GOAL #1   Title Ind with HEP.   Time 4   Period Weeks   Status On-going   PT LONG TERM GOAL #2   Title Full activeleft knee extension to normalize gait.   Time 4   Period Weeks   Status On-going  -8 degrees   PT LONG TERM GOAL #3   Title Active left knee flexion to 115 degrees to improve function.   Period Weeks   Status On-going  80 degrees   PT LONG TERM GOAL #4   Title 5/5 left knee strength to increase stability for functional activites.   Time 4   Period Weeks   Status On-going   PT LONG TERM GOAL #5   Title Perform ADL's with pain not > 2-3/10.   Time 4   Period Weeks   Status On-going   PT LONG TERM GOAL #6   Title Perform a reciprocating stair gait  with pain not > 2-3/10.   Time 4   Period Weeks   Status On-going               Plan - 04/19/15 1147    Clinical Impression Statement Patient progressing with ROM today for both flexion and ext. Has a lot of pain, swelling and stiffness in left knee. Educated patient to elevate knee above heart to help reduce sewlling and take medication per MD prescribed to help reduce pain. Goals ongoing due to pain, ROM, and edema limitations.   Pt will benefit from skilled therapeutic intervention in order to improve on the following deficits Pain;Decreased activity tolerance;Decreased range of motion;Decreased strength   Rehab Potential Good   Clinical Impairments Affecting Rehab Potential surgery 03/26/15   PT Frequency 3x / week   PT Duration 4 weeks   PT Treatment/Interventions ADLs/Self Care Home Management;Cryotherapy;Electrical Stimulation;Gait training;Neuromuscular re-education;Vasopneumatic Device;Patient/family education;Passive range of motion;Therapeutic exercise   PT Next Visit Plan Left TKA protocol. (MD Alvan Dame 05/08/15)   Consulted and Agree with Plan of Care Patient        Problem List Patient Active Problem List   Diagnosis Date Noted  . Obese 03/27/2015  . S/P left TKA 03/26/2015  . S/P knee replacement 03/26/2015  . Colitis, acute 12/01/2014  . Weight loss, unintentional 12/01/2014  . Adnexal cyst 12/01/2014  . Elevated fasting glucose 12/01/2014  . CAD (coronary artery disease) 12/01/2014  . Osteoarthritis 12/01/2014  . Vasovagal attack 02/28/2013  . Hyperlipidemia 02/28/2013    Phillips Climes, PTA 04/19/2015, 12:01 PM  [facility] [address] [place], [place], [address] Phone: [phone]   Fax:  [phone]

## 2015-04-22 ENCOUNTER — Encounter: Payer: Self-pay | Admitting: Physical Therapy

## 2015-04-22 ENCOUNTER — Other Ambulatory Visit (INDEPENDENT_AMBULATORY_CARE_PROVIDER_SITE_OTHER): Payer: BC Managed Care – PPO | Admitting: *Deleted

## 2015-04-22 ENCOUNTER — Ambulatory Visit: Payer: BC Managed Care – PPO | Admitting: Physical Therapy

## 2015-04-22 DIAGNOSIS — E785 Hyperlipidemia, unspecified: Secondary | ICD-10-CM

## 2015-04-22 DIAGNOSIS — M25562 Pain in left knee: Secondary | ICD-10-CM | POA: Diagnosis not present

## 2015-04-22 DIAGNOSIS — M25662 Stiffness of left knee, not elsewhere classified: Secondary | ICD-10-CM

## 2015-04-22 LAB — LIPID PANEL
CHOLESTEROL: 171 mg/dL (ref 0–200)
HDL: 45 mg/dL (ref >39.00)
LDL CALC: 91 mg/dL (ref 0–99)
NonHDL: 126
Total CHOL/HDL Ratio: 4
Triglycerides: 177 mg/dL — ABNORMAL HIGH (ref 0.0–149.0)
VLDL: 35.4 mg/dL (ref 0.0–40.0)

## 2015-04-22 NOTE — Therapy (Signed)
[facility] [place], [place], [address] Phone: [phone]   Fax:  [phone]  Physical Therapy Treatment  Patient Details  Name: Carol Cox MRN: [id number] Date of Birth: 1951-06-17 Referring Provider:  Christain Sacramento, MD  Encounter Date: 04/22/2015      PT End of Session - 04/22/15 1151    Visit Number 4   Number of Visits 12   Date for PT Re-Evaluation 05/27/15   PT Start Time 1118   PT Stop Time 1202   PT Time Calculation (min) 44 min   Activity Tolerance Patient tolerated treatment well   Behavior During Therapy [facility] for tasks assessed/performed      Past Medical History  Diagnosis Date  . Hyperlipidemia   . Arthritis     "joints of left knee, left hip, ankles" (02/04/2013)  . CAD (coronary artery disease)     Mild nonobstructive CAD by cath 02/06/13 (25% mid LAD), normal EF  . PONV (postoperative nausea and vomiting)   . Family history of adverse reaction to anesthesia     mother has problems with nausea and vomiting     Past Surgical History  Procedure Laterality Date  . Carpal tunnel release Bilateral 1990's  . Left heart cath  02/06/13  . Left heart catheterization with coronary angiogram N/A 02/06/2013    Procedure: LEFT HEART CATHETERIZATION WITH CORONARY ANGIOGRAM;  Surgeon: [name] Hampshire, MD;  Location: [place] [facility];  Service: Cardiovascular;  Laterality: N/A;  . Left knee meniscus surgery     . Total knee arthroplasty Left 03/26/2015    Procedure: LEFT TOTAL KNEE ARTHROPLASTY;  Surgeon: Paralee Cancel, MD;  [facility]: [facility];  Service: Orthopedics;  Laterality: Left;    There were no vitals filed for this visit.  Visit Diagnosis:  Left knee pain  Knee stiffness, left      Subjective Assessment - 04/22/15 1124    Subjective less sore today   Limitations Walking   How long can you walk comfortably? 20 minutes.   Currently in Pain? Yes   Pain Score 4    Pain Location Knee   Pain  Orientation Left   Pain Descriptors / Indicators Sore  stiff   Pain Type Surgical pain   Pain Onset 1 to 4 weeks ago   Pain Frequency Intermittent   Aggravating Factors  ROM and increased acitvity   Pain Relieving Factors rest            [facility] PT Assessment - 04/22/15 0001    AROM   Overall AROM  Deficits   AROM Assessment Site Knee   Right/Left Knee Left   Left Knee Extension -5   Left Knee Flexion 88   PROM   Overall PROM  Deficits   PROM Assessment Site Knee   Right/Left Knee Left   Left Knee Extension -2   Left Knee Flexion 94                     [facility] Adult PT Treatment/Exercise - 04/22/15 0001    Knee/Hip Exercises: Stretches   Knee: Self-Stretch to increase Flexion Left;3 reps;30 seconds   Knee/Hip Exercises: Aerobic   Stationary Bike Nustep L4x 15min for ROM   Cryotherapy   Number Minutes Cryotherapy 15 Minutes   Cryotherapy Location Knee   Type of Cryotherapy --  vasopnumatic   Electrical Stimulation   Electrical Stimulation Location left knee   Electrical Stimulation Action IFCIFC   Electrical Stimulation Parameters  1-10hz    Electrical Stimulation Goals Edema;Pain   Manual Therapy   Manual Therapy Passive ROM   Passive ROM low load holds for knee flexion and ext, gentle scar mobilization                     PT Long Term Goals - 04/22/15 1155    PT LONG TERM GOAL #1   Title Ind with HEP.   Time 4   Period Weeks   Status On-going   PT LONG TERM GOAL #2   Title Full activeleft knee extension to normalize gait.   Time 4   Period Weeks   Status On-going   PT LONG TERM GOAL #3   Title Active left knee flexion to 115 degrees to improve function.   Time 4   Period Weeks   Status On-going   PT LONG TERM GOAL #4   Title 5/5 left knee strength to increase stability for functional activites.   Time 4   Period Weeks   Status On-going   PT LONG TERM GOAL #5   Title Perform ADL's with pain not > 2-3/10.   Time 4   Period  Weeks   Status On-going   PT LONG TERM GOAL #6   Title Perform a reciprocating stair gait with pain not > 2-3/10.   Time 4   Period Weeks   Status On-going               Plan - 04/22/15 1152    Clinical Impression Statement Patient continues to progress with all activities and has improved with ROM today for flexion and ext. less soreness reported today overall. Goals ongoing due to edema/ROM limitations.   Pt will benefit from skilled therapeutic intervention in order to improve on the following deficits Pain;Decreased activity tolerance;Decreased range of motion;Decreased strength   Clinical Impairments Affecting Rehab Potential surgery 03/26/15 - current 4 weeks 04/23/15   PT Frequency 3x / week   PT Duration 4 weeks   PT Treatment/Interventions ADLs/Self Care Home Management;Cryotherapy;Electrical Stimulation;Gait training;Neuromuscular re-education;Vasopneumatic Device;Patient/family education;Passive range of motion;Therapeutic exercise   PT Next Visit Plan Left TKA protocol. (MD Alvan Dame 05/08/15)   Consulted and Agree with Plan of Care Patient        Problem List Patient Active Problem List   Diagnosis Date Noted  . Obese 03/27/2015  . S/P left TKA 03/26/2015  . S/P knee replacement 03/26/2015  . Colitis, acute 12/01/2014  . Weight loss, unintentional 12/01/2014  . Adnexal cyst 12/01/2014  . Elevated fasting glucose 12/01/2014  . CAD (coronary artery disease) 12/01/2014  . Osteoarthritis 12/01/2014  . Vasovagal attack 02/28/2013  . Hyperlipidemia 02/28/2013    Phillips Climes, PTA 04/22/2015, 12:05 PM  [facility] [address] [place], [place], [address] Phone: [phone]   Fax:  [phone]

## 2015-04-24 ENCOUNTER — Ambulatory Visit (INDEPENDENT_AMBULATORY_CARE_PROVIDER_SITE_OTHER): Payer: BC Managed Care – PPO | Admitting: Nurse Practitioner

## 2015-04-24 ENCOUNTER — Encounter: Payer: Self-pay | Admitting: Nurse Practitioner

## 2015-04-24 ENCOUNTER — Ambulatory Visit: Payer: BC Managed Care – PPO | Admitting: Physical Therapy

## 2015-04-24 ENCOUNTER — Encounter: Payer: Self-pay | Admitting: Physical Therapy

## 2015-04-24 VITALS — BP 136/80 | HR 94 | Ht 66.0 in | Wt 196.0 lb

## 2015-04-24 DIAGNOSIS — E785 Hyperlipidemia, unspecified: Secondary | ICD-10-CM

## 2015-04-24 DIAGNOSIS — M25562 Pain in left knee: Secondary | ICD-10-CM | POA: Diagnosis not present

## 2015-04-24 DIAGNOSIS — M25662 Stiffness of left knee, not elsewhere classified: Secondary | ICD-10-CM

## 2015-04-24 NOTE — Therapy (Signed)
[facility] [place], [place], [address] Phone: [phone]   Fax:  [phone]  Physical Therapy Treatment  Patient Details  Name: Carol Cox MRN: [id number] Date of Birth: 07/31/1951 Referring Provider:  Christain Sacramento, MD  Encounter Date: 04/24/2015      PT End of Session - 04/24/15 1115    Visit Number 5   Number of Visits 12   Date for PT Re-Evaluation 05/27/15   PT Start Time 1032   PT Stop Time 1131   PT Time Calculation (min) 59 min   Activity Tolerance Patient tolerated treatment well   Behavior During Therapy [facility] for tasks assessed/performed      Past Medical History  Diagnosis Date  . Hyperlipidemia   . Arthritis     "joints of left knee, left hip, ankles" (02/04/2013)  . CAD (coronary artery disease)     Mild nonobstructive CAD by cath 02/06/13 (25% mid LAD), normal EF  . PONV (postoperative nausea and vomiting)   . Family history of adverse reaction to anesthesia     mother has problems with nausea and vomiting     Past Surgical History  Procedure Laterality Date  . Carpal tunnel release Bilateral 1990's  . Left heart cath  02/06/13  . Left heart catheterization with coronary angiogram N/A 02/06/2013    Procedure: LEFT HEART CATHETERIZATION WITH CORONARY ANGIOGRAM;  Surgeon: [name] Hampshire, MD;  Location: [place] [facility]ATH [facility];  Service: Cardiovascular;  Laterality: N/A;  . Left knee meniscus surgery     . Total knee arthroplasty Left 03/26/2015    Procedure: LEFT TOTAL KNEE ARTHROPLASTY;  Surgeon: Paralee Cancel, MD;  [facility];  Service: Orthopedics;  Laterality: Left;    There were no vitals filed for this visit.  Visit Diagnosis:  Left knee pain  Knee stiffness, left      Subjective Assessment - 04/24/15 1101    Subjective dropped i-pad on corner on knee with a little raised and reddness which is better upon arrival   Limitations Walking   How long can you walk comfortably? 20 minutes.    Currently in Pain? Yes   Pain Score 4    Pain Location Knee   Pain Orientation Left   Pain Descriptors / Indicators Sore   Pain Type Surgical pain   Pain Onset 1 to 4 weeks ago   Aggravating Factors  ROM and increased activity   Pain Relieving Factors rest            [facility] PT Assessment - 04/24/15 0001    AROM   Overall AROM  Deficits   AROM Assessment Site Knee   Right/Left Knee Left   Left Knee Extension -4   Left Knee Flexion 93   PROM   Overall PROM  Deficits   PROM Assessment Site Knee   Right/Left Knee Left   Left Knee Extension 0   Left Knee Flexion 100                     [facility] Adult PT Treatment/Exercise - 04/24/15 0001    Knee/Hip Exercises: Stretches   Knee: Self-Stretch to increase Flexion Left;3 reps;30 seconds   Knee/Hip Exercises: Aerobic   Stationary Bike Nustep L4x 57min for ROM   Knee/Hip Exercises: Standing   Forward Step Up Left;3 sets;10 reps;Step Height: 6"   Rocker Board 3 minutes   Cryotherapy   Number Minutes Cryotherapy 15 Minutes   Cryotherapy Location Knee  Type of Cryotherapy --  vasopnumatic   Acupuncturist Location left knee   Electrical Stimulation Action IFC   Electrical Stimulation Parameters 1-10hz    Electrical Stimulation Goals Edema;Pain   Manual Therapy   Manual Therapy Passive ROM   Passive ROM low load holds for knee flexion and ext, gentle scar mobilization                     PT Long Term Goals - 04/22/15 1155    PT LONG TERM GOAL #1   Title Ind with HEP.   Time 4   Period Weeks   Status On-going   PT LONG TERM GOAL #2   Title Full activeleft knee extension to normalize gait.   Time 4   Period Weeks   Status On-going   PT LONG TERM GOAL #3   Title Active left knee flexion to 115 degrees to improve function.   Time 4   Period Weeks   Status On-going   PT LONG TERM GOAL #4   Title 5/5 left knee strength to increase stability for functional  activites.   Time 4   Period Weeks   Status On-going   PT LONG TERM GOAL #5   Title Perform ADL's with pain not > 2-3/10.   Time 4   Period Weeks   Status On-going   PT LONG TERM GOAL #6   Title Perform a reciprocating stair gait with pain not > 2-3/10.   Time 4   Period Weeks   Status On-going               Plan - 04/24/15 1117    Clinical Impression Statement Patient continues to progress with ROM today and less swelling. Patient has continued to perform self ROM/stretching at home. Goals ongoing due to pain and Full ROM limitations.   Pt will benefit from skilled therapeutic intervention in order to improve on the following deficits Pain;Decreased activity tolerance;Decreased range of motion;Decreased strength   Rehab Potential Good   Clinical Impairments Affecting Rehab Potential surgery 03/26/15 - current 4 weeks 04/23/15   PT Frequency 3x / week   PT Duration 4 weeks   PT Treatment/Interventions ADLs/Self Care Home Management;Cryotherapy;Electrical Stimulation;Gait training;Neuromuscular re-education;Vasopneumatic Device;Patient/family education;Passive range of motion;Therapeutic exercise   PT Next Visit Plan Left TKA protocol. (MD Alvan Dame 05/08/15)   Consulted and Agree with Plan of Care Patient        Problem List Patient Active Problem List   Diagnosis Date Noted  . Obese 03/27/2015  . S/P left TKA 03/26/2015  . S/P knee replacement 03/26/2015  . Colitis, acute 12/01/2014  . Weight loss, unintentional 12/01/2014  . Adnexal cyst 12/01/2014  . Elevated fasting glucose 12/01/2014  . CAD (coronary artery disease) 12/01/2014  . Osteoarthritis 12/01/2014  . Vasovagal attack 02/28/2013  . Hyperlipidemia 02/28/2013    Phillips Climes, PTA 04/24/2015, 11:33 AM  [facility] [address] [place], [place], [address] Phone: [phone]   Fax:  [phone]

## 2015-04-24 NOTE — Patient Instructions (Addendum)
We will be checking the following labs today - NONE   Medication Instructions:    Continue with your current medicines.     Testing/Procedures To Be Arranged:  N/A  Follow-Up:   See Dr. Aundra Dubin in one year.     Other Special Instructions:   N/A  Call the [place] office at [phone] if you have any questions, problems or concerns.

## 2015-04-24 NOTE — Progress Notes (Signed)
CARDIOLOGY OFFICE NOTE  Date:  04/24/2015    Carol Cox Date of Birth: 07-Mar-1951 Medical Record #[id number]  PCP:  Woody Seller, MD  Cardiologist:  Aundra Dubin   Chief Complaint  Patient presents with  . Coronary Artery Disease    Follow up visit - seen for Dr. Aundra Dubin    History of Present Illness: Carol Cox is a 64 y.o. female who presents today for a follow up visit. She is seen for Dr. Aundra Dubin. She has a strong family history of CAD.   Last seen here in 2014.   Comes in today. Here alone. Has had recent TKR on the left - 4 weeks ago - doing well but very anxious to "do more" and get back to her walking routine. Cardiac wise she is doing great. No chest pain. Not short of breath. BP low after her surgery - given some iron. BP now ok. Knee is hurting her today. She typically takes her statin 4 to 5 times a week only due to myalgias.   PMH: 1. Hyperlipidemia 2. GERD 3. Possible vasovagal symptoms 4. LHC (4/14) with EF 65-70%, 25% mLAD.   Past Medical History  Diagnosis Date  . Hyperlipidemia   . Arthritis     "joints of left knee, left hip, ankles" (02/04/2013)  . CAD (coronary artery disease)     Mild nonobstructive CAD by cath 02/06/13 (25% mid LAD), normal EF  . PONV (postoperative nausea and vomiting)   . Family history of adverse reaction to anesthesia     mother has problems with nausea and vomiting     Past Surgical History  Procedure Laterality Date  . Carpal tunnel release Bilateral 1990's  . Left heart cath  02/06/13  . Left heart catheterization with coronary angiogram N/A 02/06/2013    Procedure: LEFT HEART CATHETERIZATION WITH CORONARY ANGIOGRAM;  Surgeon: [name] Hampshire, MD;  Location: [facility];  Service: Cardiovascular;  Laterality: N/A;  . Left knee meniscus surgery     . Total knee arthroplasty Left 03/26/2015    Procedure: LEFT TOTAL KNEE ARTHROPLASTY;  Surgeon: Paralee Cancel, MD;  Location: [facility];  Service: Orthopedics;   Laterality: Left;     Medications: Current Outpatient Prescriptions  Medication Sig Dispense Refill  . aspirin EC 325 MG EC tablet Take 1 tablet (325 mg total) by mouth 2 (two) times daily. 60 tablet 0  . cetirizine (ZYRTEC) 10 MG tablet Take 10 mg by mouth as needed for allergies.     . Coenzyme Q10 (COQ10 PO) Take 1 capsule by mouth daily.    Marland Kitchen docusate sodium (COLACE) 100 MG capsule Take 1 capsule (100 mg total) by mouth 2 (two) times daily. 10 capsule 0  . ezetimibe-simvastatin (VYTORIN) 10-40 MG per tablet Take 1 tablet by mouth at bedtime. (Patient taking differently: Take 1 tablet by mouth 4 (four) times a week. Monday, Wednesday, Friday and one on the weekend.) 30 tablet 1  . ferrous sulfate 325 (65 FE) MG tablet Take 1 tablet (325 mg total) by mouth 3 (three) times daily after meals.  3  . fish oil-omega-3 fatty acids 1000 MG capsule Take 1 g by mouth daily.      Marland Kitchen HYDROcodone-acetaminophen (NORCO) 7.5-325 MG per tablet Take 1-2 tablets by mouth every 4 (four) hours as needed for moderate pain. 100 tablet 0  . METROGEL 1 % gel Apply 1 application topically daily.     . Multiple Vitamin (MULTIVITAMIN) tablet Take 1 tablet  by mouth daily.      . nitroGLYCERIN (NITROSTAT) 0.4 MG SL tablet Place 1 tablet (0.4 mg total) under the tongue every 5 (five) minutes as needed for chest pain (up to 3 doses). 25 tablet 4  . polyethylene glycol (MIRALAX / GLYCOLAX) packet Take 17 g by mouth 2 (two) times daily. 14 each 0  . Red Yeast Rice Extract (RED YEAST RICE PO) Take 1 tablet by mouth 3 (three) times a week. No particular day     No current facility-administered medications for this visit.    Allergies: Allergies  Allergen Reactions  . Crestor [Rosuvastatin] Other (See Comments)    Leg pain  . Lipitor [Atorvastatin] Other (See Comments)    Leg pain    Social History: The patient  reports that she has never smoked. She has never used smokeless tobacco. She reports that she does not  drink alcohol or use illicit drugs.   Family History: The patient's family history includes CAD (age of onset: 50) in her maternal uncle; CAD (age of onset: 33) in her maternal uncle; CAD (age of onset: 73) in her sister; CVA in her mother; Heart attack (age of onset: 37) in her maternal uncle; Heart attack (age of onset: 35) in her maternal uncle; Heart attack (age of onset: 39) in her sister; Heart attack (age of onset: 55) in her sister; Sudden death (age of onset: 49) in her father.   Review of Systems: Please see the history of present illness.   Otherwise, the review of systems is positive for none.   All other systems are reviewed and negative.   Physical Exam: VS:  BP 136/80 mmHg  Pulse 94  Ht 5\' 6"  (1.676 m)  Wt 196 lb (88.905 kg)  BMI 31.65 kg/m2 .  BMI Body mass index is 31.65 kg/(m^2).  Wt Readings from Last 3 Encounters:  04/24/15 196 lb (88.905 kg)  03/26/15 197 lb (89.359 kg)  03/20/15 197 lb (89.359 kg)    General: Pleasant. Well developed, well nourished and in no acute distress.  HEENT: Normal. Neck: Supple, no JVD, carotid bruits, or masses noted.  Cardiac: Regular rate and rhythm. HR is 94 by me. No murmurs, rubs, or gallops. No edema.  Respiratory:  Lungs are clear to auscultation bilaterally with normal work of breathing.  GI: Soft and nontender.  MS: No deformity or atrophy. Gait and ROM intact. Skin: Warm and dry. Color is normal.  Neuro:  Strength and sensation are intact and no gross focal deficits noted.  Psych: Alert, appropriate and with normal affect.   LABORATORY DATA:  EKG:  EKG is ordered today.   Lab Results  Component Value Date   WBC 9.8 03/27/2015   HGB 12.7 03/27/2015   HCT 39.0 03/27/2015   PLT 208 03/27/2015   GLUCOSE 137* 03/27/2015   CHOL 171 04/22/2015   TRIG 177.0* 04/22/2015   HDL 45.00 04/22/2015   LDLDIRECT 106.3 05/09/2013   LDLCALC 91 04/22/2015   ALT 25 12/02/2014   AST 29 12/02/2014   NA 140 03/27/2015   K 4.6  03/27/2015   CL 107 03/27/2015   CREATININE 0.76 03/27/2015   BUN 18 03/27/2015   CO2 26 03/27/2015   TSH 2.400 02/04/2013   INR 0.88 03/20/2015   HGBA1C 5.7* 12/01/2014    BNP (last 3 results) No results for input(s): BNP in the last 8760 hours.  ProBNP (last 3 results) No results for input(s): PROBNP in the last 8760 hours.  Other Studies Reviewed Today:  Coronary angiography from 01/2013:  Left mainstem: Short vessel, no significant CAD.   Left anterior descending (LAD): 25% mid LAD stenosis.   Left circumflex (LCx): No significant CAD.   Right coronary artery (RCA): No significant CAD.  Left ventriculography: Left ventricular systolic function is normal, LVEF is estimated at 65-70%, there is no significant mitral regurgitation   Final Conclusions: Mild nonobstructive CAD. Suspect noncardiac chest pain. May go home today. Continue home statin and continue ASA 81 mg daily.   Loralie Champagne 02/06/2013, 8:27 AM  Assessment/Plan: 1. CAD - mild nonobstructive disease by cath 2 years ago - she is doing well without symptoms. Recuperating from TKR. Would continue with her current regimen. See back in one year. Update EKG today.   2. HLD - she is on statin therapy - labs from yesterday already reviewed by Dr. Aundra Dubin and are reported to the patient. She will continue with her current regimen.   3. Obesity  4. Recent TKR - very anxious to return to her normal activities and walking program.   Current medicines are reviewed with the patient today.  The patient does not have concerns regarding medicines other than what has been noted above.  The following changes have been made:  See above.  Labs/ tests ordered today include:   No orders of the defined types were placed in this encounter.     Disposition:   FU with Dr. Aundra Dubin prn.   Patient is agreeable to this plan and will call if any problems develop in the interim.   Signed: Burtis Junes, RN,  ANP-C 04/24/2015 1:53 PM  [facility] [address] [place], [place]  [address] Phone: [phone] Fax: [phone]

## 2015-04-26 ENCOUNTER — Ambulatory Visit: Payer: BC Managed Care – PPO | Attending: Orthopedic Surgery | Admitting: Physical Therapy

## 2015-04-26 ENCOUNTER — Encounter: Payer: Self-pay | Admitting: Physical Therapy

## 2015-04-26 DIAGNOSIS — M25662 Stiffness of left knee, not elsewhere classified: Secondary | ICD-10-CM | POA: Insufficient documentation

## 2015-04-26 DIAGNOSIS — M25562 Pain in left knee: Secondary | ICD-10-CM | POA: Diagnosis not present

## 2015-04-26 NOTE — Therapy (Signed)
[facility] [place], [place], [address] Phone: [phone]   Fax:  [phone]  Physical Therapy Treatment  Patient Details  Name: Carol Cox MRN: [id number] Date of Birth: 05/18/1951 Referring Provider:  Christain Sacramento, MD  Encounter Date: 04/26/2015      PT End of Session - 04/26/15 1158    Visit Number 6   Number of Visits 12   Date for PT Re-Evaluation 05/27/15   PT Start Time 1120   PT Stop Time 1206   PT Time Calculation (min) 46 min   Activity Tolerance Patient tolerated treatment well   Behavior During Therapy [facility] for tasks assessed/performed      Past Medical History  Diagnosis Date  . Hyperlipidemia   . Arthritis     "joints of left knee, left hip, ankles" (02/04/2013)  . CAD (coronary artery disease)     Mild nonobstructive CAD by cath 02/06/13 (25% mid LAD), normal EF  . PONV (postoperative nausea and vomiting)   . Family history of adverse reaction to anesthesia     mother has problems with nausea and vomiting     Past Surgical History  Procedure Laterality Date  . Carpal tunnel release Bilateral 1990's  . Left heart cath  02/06/13  . Left heart catheterization with coronary angiogram N/A 02/06/2013    Procedure: LEFT HEART CATHETERIZATION WITH CORONARY ANGIOGRAM;  Surgeon: [name] Hampshire, MD;  Location: [place] CATH [facility];  Service: Cardiovascular;  Laterality: N/A;  . Left knee meniscus surgery     . Total knee arthroplasty Left 03/26/2015    Procedure: LEFT TOTAL KNEE ARTHROPLASTY;  Surgeon: Paralee Cancel, MD;  Location: [facility];  Service: Orthopedics;  Laterality: Left;    There were no vitals filed for this visit.  Visit Diagnosis:  Left knee pain  Knee stiffness, left      Subjective Assessment - 04/26/15 1123    Subjective Continues to experience some stiffness in left knee; she continues to perform exercises at home.    Limitations Standing;Walking;Other (comment)   How long can you sit  comfortably? 10 min   How long can you stand comfortably? 10 min   How long can you walk comfortably? 10-20 min   Patient Stated Goals Return to all functional activity without functional limitations   Currently in Pain? Yes   Pain Score 4    Pain Location Knee   Pain Orientation Left   Pain Descriptors / Indicators Constant   Pain Type Surgical pain   Pain Frequency Intermittent   Aggravating Factors  increased activity   Pain Relieving Factors ice, elevation and rest   Effect of Pain on Daily Activities limiting daily activities   Multiple Pain Sites No                         [facility] Adult PT Treatment/Exercise - 04/26/15 0001    Knee/Hip Exercises: Aerobic   Stationary Bike Nustep L4x 74min for ROM   Knee/Hip Exercises: Standing   Forward Step Up Left;3 sets;10 reps;Step Height: 6"   Rocker Board 3 minutes   Modalities   Modalities Vasopneumatic;Electrical Stimulation   Electrical Stimulation   Electrical Stimulation Location left knee   Electrical Stimulation Action premod    Electrical Stimulation Parameters 80-150 hz   Electrical Stimulation Goals Pain   Vasopneumatic   Number Minutes Vasopneumatic  15 minutes   Vasopnuematic Location  Knee   Vasopneumatic Pressure Low  Vasopneumatic Temperature  55/13   Manual Therapy   Manual Therapy Joint mobilization;Passive ROM  Grade II/III joint mobs to increase flex/ext; left knee PROM                     PT Long Term Goals - 04/22/15 1155    PT LONG TERM GOAL #1   Title Ind with HEP.   Time 4   Period Weeks   Status On-going   PT LONG TERM GOAL #2   Title Full activeleft knee extension to normalize gait.   Time 4   Period Weeks   Status On-going   PT LONG TERM GOAL #3   Title Active left knee flexion to 115 degrees to improve function.   Time 4   Period Weeks   Status On-going   PT LONG TERM GOAL #4   Title 5/5 left knee strength to increase stability for functional activites.    Time 4   Period Weeks   Status On-going   PT LONG TERM GOAL #5   Title Perform ADL's with pain not > 2-3/10.   Time 4   Period Weeks   Status On-going   PT LONG TERM GOAL #6   Title Perform a reciprocating stair gait with pain not > 2-3/10.   Time 4   Period Weeks   Status On-going               Plan - 04/26/15 1200    Clinical Impression Statement Pt. continues to have some pain an swelling in left knee but is performing all exercises in therapy today--required some cues to perform correctly.  She will continue to benefit from ROM and strengthing exercises to improve functional outcomes.   Pt will benefit from skilled therapeutic intervention in order to improve on the following deficits Pain;Decreased activity tolerance;Decreased range of motion;Decreased strength   Rehab Potential Good   Clinical Impairments Affecting Rehab Potential surgery 03/26/15 - current 4 weeks 04/23/15   PT Frequency 3x / week   PT Duration 4 weeks   PT Treatment/Interventions ADLs/Self Care Home Management;Cryotherapy;Electrical Stimulation;Gait training;Neuromuscular re-education;Vasopneumatic Device;Patient/family education;Passive range of motion;Therapeutic exercise   PT Next Visit Plan Left TKA protocol. (MD Alvan Dame 05/08/15)   PT Home Exercise Plan progress if appropriate   Consulted and Agree with Plan of Care Patient        Problem List Patient Active Problem List   Diagnosis Date Noted  . Obese 03/27/2015  . S/P left TKA 03/26/2015  . S/P knee replacement 03/26/2015  . Colitis, acute 12/01/2014  . Weight loss, unintentional 12/01/2014  . Adnexal cyst 12/01/2014  . Elevated fasting glucose 12/01/2014  . CAD (coronary artery disease) 12/01/2014  . Osteoarthritis 12/01/2014  . Vasovagal attack 02/28/2013  . Hyperlipidemia 02/28/2013    Cherlyn Cushing 04/26/2015, 12:10 PM  [facility] [address] [place], [place], [address] Phone:  [phone]   Fax:  [phone]

## 2015-04-30 ENCOUNTER — Encounter: Payer: Self-pay | Admitting: Physical Therapy

## 2015-04-30 ENCOUNTER — Ambulatory Visit: Payer: BC Managed Care – PPO | Admitting: Physical Therapy

## 2015-04-30 DIAGNOSIS — M25662 Stiffness of left knee, not elsewhere classified: Secondary | ICD-10-CM

## 2015-04-30 DIAGNOSIS — M25562 Pain in left knee: Secondary | ICD-10-CM

## 2015-04-30 NOTE — Therapy (Signed)
[facility] [place], [place], [address] Phone: [phone]   Fax:  [phone]  Physical Therapy Treatment  Patient Details  Name: Carol Cox MRN: [id number] Date of Birth: [birth year] Referring Provider:  Christain Sacramento, MD  Encounter Date: 04/30/2015      PT End of Session - 04/30/15 1112    Visit Number 7   Number of Visits 12   Date for PT Re-Evaluation 05/27/15   PT Start Time 1028   PT Stop Time 1127   PT Time Calculation (min) 59 min   Activity Tolerance Patient tolerated treatment well   Behavior During Therapy [facility] for tasks assessed/performed      Past Medical History  Diagnosis Date  . Hyperlipidemia   . Arthritis     "joints of left knee, left hip, ankles" (02/04/2013)  . CAD (coronary artery disease)     Mild nonobstructive CAD by cath 02/06/13 (25% mid LAD), normal EF  . PONV (postoperative nausea and vomiting)   . Family history of adverse reaction to anesthesia     mother has problems with nausea and vomiting     Past Surgical History  Procedure Laterality Date  . Carpal tunnel release Bilateral 1990's  . Left heart cath  02/06/13  . Left heart catheterization with coronary angiogram N/A 02/06/2013    Procedure: LEFT HEART CATHETERIZATION WITH CORONARY ANGIOGRAM;  Surgeon: [name] Hampshire, MD;  Location: [place] [facility] CATH [facility];  Service: Cardiovascular;  Laterality: N/A;  . Left knee meniscus surgery     . Total knee arthroplasty Left 03/26/2015    Procedure: LEFT TOTAL KNEE ARTHROPLASTY;  Surgeon: Paralee Cancel, MD;  Location: [facility];  Service: Orthopedics;  Laterality: Left;    There were no vitals filed for this visit.  Visit Diagnosis:  Left knee pain  Knee stiffness, left      Subjective Assessment - 04/30/15 1041    Subjective unable to perform a lot of self stretches over weekend due to being busy. Patient had family member clean surgical site and she noticed some drainage from distal  insition. Today MPT did not see any drainage yet  told patient to call MD just to let him know.   Limitations Standing;Walking;Other (comment)   How long can you sit comfortably? 10 min   How long can you stand comfortably? 10 min   How long can you walk comfortably? 10-20 min   Patient Stated Goals Return to all functional activity without functional limitations   Currently in Pain? Yes   Pain Score 2    Pain Location Knee   Pain Orientation Left   Pain Descriptors / Indicators Constant   Pain Type Surgical pain   Pain Onset 1 to 4 weeks ago   Pain Frequency Intermittent   Aggravating Factors  increased activity and ROM   Pain Relieving Factors rest            [facility] PT Assessment - 04/30/15 0001    AROM   Overall AROM  Deficits   AROM Assessment Site Knee   Right/Left Knee Left   Left Knee Extension -4   Left Knee Flexion 94   PROM   Overall PROM  Deficits   PROM Assessment Site Knee   Right/Left Knee Left   Left Knee Extension 0   Left Knee Flexion 100                     [facility] Adult PT Treatment/Exercise -  04/30/15 0001    Knee/Hip Exercises: Stretches   Knee: Self-Stretch to increase Flexion Left;3 reps;30 seconds   Knee/Hip Exercises: Aerobic   Stationary Bike Nustep L4x 86min for ROM   Knee/Hip Exercises: Standing   Rocker Board 2 minutes   Electrical Stimulation   Electrical Stimulation Location left knee   Electrical Stimulation Action premod   Electrical Stimulation Parameters 1-10hz    Electrical Stimulation Goals Pain   Vasopneumatic   Number Minutes Vasopneumatic  15 minutes   Vasopnuematic Location  Knee   Vasopneumatic Pressure Medium   Vasopneumatic Temperature  47   Manual Therapy   Manual Therapy Passive ROM   Passive ROM low load holds for knee flexion and ext, gentle scar mobilization                     PT Long Term Goals - 04/22/15 1155    PT LONG TERM GOAL #1   Title Ind with HEP.   Time 4   Period Weeks    Status On-going   PT LONG TERM GOAL #2   Title Full activeleft knee extension to normalize gait.   Time 4   Period Weeks   Status On-going   PT LONG TERM GOAL #3   Title Active left knee flexion to 115 degrees to improve function.   Time 4   Period Weeks   Status On-going   PT LONG TERM GOAL #4   Title 5/5 left knee strength to increase stability for functional activites.   Time 4   Period Weeks   Status On-going   PT LONG TERM GOAL #5   Title Perform ADL's with pain not > 2-3/10.   Time 4   Period Weeks   Status On-going   PT LONG TERM GOAL #6   Title Perform a reciprocating stair gait with pain not > 2-3/10.   Time 4   Period Weeks   Status On-going               Plan - 04/30/15 1114    Clinical Impression Statement Patient slowly progressing today due to swelling and stiffness in left knee. Has not had much improvement in ROM today. Educated patient on continues self stretching for flexion and elevation for swelling. Goals ongoing due to ROM and edema deficits.   Pt will benefit from skilled therapeutic intervention in order to improve on the following deficits Pain;Decreased activity tolerance;Decreased range of motion;Decreased strength   Rehab Potential Good   Clinical Impairments Affecting Rehab Potential surgery 03/26/15 - current 4 weeks 04/23/15   PT Frequency 3x / week   PT Duration 4 weeks   PT Treatment/Interventions ADLs/Self Care Home Management;Cryotherapy;Electrical Stimulation;Gait training;Neuromuscular re-education;Vasopneumatic Device;Patient/family education;Passive range of motion;Therapeutic exercise   PT Next Visit Plan Left TKA protocol. (MD Alvan Dame 05/08/15)   Consulted and Agree with Plan of Care Patient        Problem List Patient Active Problem List   Diagnosis Date Noted  . Obese 03/27/2015  . S/P left TKA 03/26/2015  . S/P knee replacement 03/26/2015  . Colitis, acute 12/01/2014  . Weight loss, unintentional 12/01/2014  . Adnexal  cyst 12/01/2014  . Elevated fasting glucose 12/01/2014  . CAD (coronary artery disease) 12/01/2014  . Osteoarthritis 12/01/2014  . Vasovagal attack 02/28/2013  . Hyperlipidemia 02/28/2013    DUNFORD, CHRISTINA P, PTA 04/30/2015, 11:40 AM  [facility] [address] [place], [place], [address] Phone: [phone]   Fax:  [phone]

## 2015-05-03 ENCOUNTER — Encounter: Payer: Self-pay | Admitting: Physical Therapy

## 2015-05-03 ENCOUNTER — Ambulatory Visit: Payer: BC Managed Care – PPO | Admitting: Physical Therapy

## 2015-05-03 DIAGNOSIS — M25562 Pain in left knee: Secondary | ICD-10-CM

## 2015-05-03 DIAGNOSIS — M25662 Stiffness of left knee, not elsewhere classified: Secondary | ICD-10-CM

## 2015-05-03 NOTE — Therapy (Signed)
[facility] [place], [place], [address] Phone: [phone]   Fax:  [phone]  Physical Therapy Treatment  Patient Details  Name: Carol Cox MRN: [id number] Date of Birth: 08-02-1951 Referring Provider:  Christain Sacramento, MD  Encounter Date: 05/03/2015      PT End of Session - 05/03/15 1151    Visit Number 8   Number of Visits 12   Date for PT Re-Evaluation 05/27/15   PT Start Time 1114   PT Stop Time 1200   PT Time Calculation (min) 46 min   Activity Tolerance Patient tolerated treatment well   Behavior During Therapy [facility] for tasks assessed/performed      Past Medical History  Diagnosis Date  . Hyperlipidemia   . Arthritis     "joints of left knee, left hip, ankles" (02/04/2013)  . CAD (coronary artery disease)     Mild nonobstructive CAD by cath 02/06/13 (25% mid LAD), normal EF  . PONV (postoperative nausea and vomiting)   . Family history of adverse reaction to anesthesia     mother has problems with nausea and vomiting     Past Surgical History  Procedure Laterality Date  . Carpal tunnel release Bilateral 1990's  . Left heart cath  02/06/13  . Left heart catheterization with coronary angiogram N/A 02/06/2013    Procedure: LEFT HEART CATHETERIZATION WITH CORONARY ANGIOGRAM;  Surgeon: [name] Hampshire, MD;  Location: [place] CATH LAB;  Service: Cardiovascular;  Laterality: N/A;  . Left knee meniscus surgery     . Total knee arthroplasty Left 03/26/2015    Procedure: LEFT TOTAL KNEE ARTHROPLASTY;  Surgeon: Paralee Cancel, MD;  Location: [facility];  Service: Orthopedics;  Laterality: Left;    There were no vitals filed for this visit.  Visit Diagnosis:  Left knee pain  Knee stiffness, left      Subjective Assessment - 05/03/15 1143    Subjective patient gave MD call regarding some drainage and her gave her anibiotics. next week is appt   Limitations Standing;Walking;Other (comment)   How long can you sit  comfortably? 10 min   How long can you stand comfortably? 10 min   How long can you walk comfortably? 10-20 min   Patient Stated Goals Return to all functional activity without functional limitations   Currently in Pain? Yes   Pain Score 3    Pain Location Knee   Pain Orientation Left   Pain Descriptors / Indicators Constant   Pain Type Surgical pain   Pain Onset 1 to 4 weeks ago   Pain Frequency Intermittent   Aggravating Factors  rom   Pain Relieving Factors ice and rest            [facility] PT Assessment - 05/03/15 0001    AROM   Overall AROM  Deficits   AROM Assessment Site Knee   Right/Left Knee Left   Left Knee Extension -4   Left Knee Flexion 95   PROM   Overall PROM  Deficits   PROM Assessment Site Knee   Right/Left Knee Left   Left Knee Extension 0   Left Knee Flexion 101                     [facility] Adult PT Treatment/Exercise - 05/03/15 0001    Knee/Hip Exercises: Stretches   Knee: Self-Stretch to increase Flexion Left;3 reps;30 seconds   Knee/Hip Exercises: Aerobic   Stationary Bike Nustep L4x 22min for ROM  Knee/Hip Exercises: Standing   Rocker Board 2 minutes   Electrical Stimulation   Electrical Stimulation Location left knee   Electrical Stimulation Action premod   Electrical Stimulation Parameters 1-10hz    Electrical Stimulation Goals Pain   Vasopneumatic   Number Minutes Vasopneumatic  10 minutes   Vasopnuematic Location  Knee   Vasopneumatic Pressure Medium   Manual Therapy   Manual Therapy Passive ROM   Passive ROM low load holds for knee flexion and ext, gentle scar mobilization                     PT Long Term Goals - 04/22/15 1155    PT LONG TERM GOAL #1   Title Ind with HEP.   Time 4   Period Weeks   Status On-going   PT LONG TERM GOAL #2   Title Full activeleft knee extension to normalize gait.   Time 4   Period Weeks   Status On-going   PT LONG TERM GOAL #3   Title Active left knee flexion to 115  degrees to improve function.   Time 4   Period Weeks   Status On-going   PT LONG TERM GOAL #4   Title 5/5 left knee strength to increase stability for functional activites.   Time 4   Period Weeks   Status On-going   PT LONG TERM GOAL #5   Title Perform ADL's with pain not > 2-3/10.   Time 4   Period Weeks   Status On-going   PT LONG TERM GOAL #6   Title Perform a reciprocating stair gait with pain not > 2-3/10.   Time 4   Period Weeks   Status On-going               Plan - 05/03/15 1152    Clinical Impression Statement Patient slowly progressing due to edema and ROM. Patient continues to perform self stretching. Improved left knee flexion active and passive by 1 degree today. unable to meet any further goals dure to swelling and ROM deficits.   Pt will benefit from skilled therapeutic intervention in order to improve on the following deficits Pain;Decreased activity tolerance;Decreased range of motion;Decreased strength   Rehab Potential Good   Clinical Impairments Affecting Rehab Potential surgery 03/26/15 - current 5 weeks 04/30/15   PT Frequency 3x / week   PT Duration 4 weeks   PT Treatment/Interventions ADLs/Self Care Home Management;Cryotherapy;Electrical Stimulation;Gait training;Neuromuscular re-education;Vasopneumatic Device;Patient/family education;Passive range of motion;Therapeutic exercise   PT Next Visit Plan Left TKA protocol. (MD Alvan Dame 05/08/15) note next treatment   Consulted and Agree with Plan of Care Patient        Problem List Patient Active Problem List   Diagnosis Date Noted  . Obese 03/27/2015  . S/P left TKA 03/26/2015  . S/P knee replacement 03/26/2015  . Colitis, acute 12/01/2014  . Weight loss, unintentional 12/01/2014  . Adnexal cyst 12/01/2014  . Elevated fasting glucose 12/01/2014  . CAD (coronary artery disease) 12/01/2014  . Osteoarthritis 12/01/2014  . Vasovagal attack 02/28/2013  . Hyperlipidemia 02/28/2013    Charels Stambaugh,  Rox Mcgriff P, PTA 05/03/2015, 12:00 PM  [facility] [address] [place], [place], [address] Phone: [phone]   Fax:  [phone]

## 2015-05-06 ENCOUNTER — Ambulatory Visit: Payer: BC Managed Care – PPO | Admitting: Physical Therapy

## 2015-05-06 ENCOUNTER — Encounter: Payer: Self-pay | Admitting: Physical Therapy

## 2015-05-06 DIAGNOSIS — M25662 Stiffness of left knee, not elsewhere classified: Secondary | ICD-10-CM

## 2015-05-06 DIAGNOSIS — M25562 Pain in left knee: Secondary | ICD-10-CM

## 2015-05-06 NOTE — Therapy (Signed)
[facility] [place], [place], [address] Phone: [phone]   Fax:  [phone]  Physical Therapy Treatment  Patient Details  Name: Carol Cox MRN: [id number] Date of Birth: 1951/07/09 Referring Provider:  Christain Sacramento, MD  Encounter Date: 05/06/2015      PT End of Session - 05/06/15 1148    Visit Number 9   Number of Visits 12   Date for PT Re-Evaluation 05/27/15   PT Start Time 1115   PT Stop Time 1204   PT Time Calculation (min) 49 min   Activity Tolerance Patient tolerated treatment well   Behavior During Therapy [facility] for tasks assessed/performed      Past Medical History  Diagnosis Date  . Hyperlipidemia   . Arthritis     "joints of left knee, left hip, ankles" (02/04/2013)  . CAD (coronary artery disease)     Mild nonobstructive CAD by cath 02/06/13 (25% mid LAD), normal EF  . PONV (postoperative nausea and vomiting)   . Family history of adverse reaction to anesthesia     mother has problems with nausea and vomiting     Past Surgical History  Procedure Laterality Date  . Carpal tunnel release Bilateral 1990's  . Left heart cath  02/06/13  . Left heart catheterization with coronary angiogram N/A 02/06/2013    Procedure: LEFT HEART CATHETERIZATION WITH CORONARY ANGIOGRAM;  Surgeon: [name] Hampshire, MD;  Location: [place] CATH LAB;  Service: Cardiovascular;  Laterality: N/A;  . Left knee meniscus surgery     . Total knee arthroplasty Left 03/26/2015    Procedure: LEFT TOTAL KNEE ARTHROPLASTY;  Surgeon: Paralee Cancel, MD;  Location: [facility];  Service: Orthopedics;  Laterality: Left;    There were no vitals filed for this visit.  Visit Diagnosis:  Left knee pain  Knee stiffness, left      Subjective Assessment - 05/06/15 1123    Subjective patient continues to report stiff and sore knee   Limitations Standing;Walking;Other (comment)   How long can you sit comfortably? 10 min   How long can you stand  comfortably? 10 min   How long can you walk comfortably? 10-20 min   Patient Stated Goals Return to all functional activity without functional limitations   Currently in Pain? Yes   Pain Score 3    Pain Location Knee   Pain Orientation Left   Pain Descriptors / Indicators Constant   Pain Type Surgical pain   Pain Onset 1 to 4 weeks ago   Pain Frequency Intermittent   Aggravating Factors  rom   Pain Relieving Factors rest            [facility] PT Assessment - 05/06/15 0001    AROM   Overall AROM  Deficits   AROM Assessment Site Knee   Right/Left Knee Left   Left Knee Extension -5   Left Knee Flexion 99   PROM   Overall PROM  Deficits;Within functional limits for tasks performed   PROM Assessment Site Knee   Right/Left Knee Left   Left Knee Extension 0   Left Knee Flexion 107                     [facility] Adult PT Treatment/Exercise - 05/06/15 0001    Knee/Hip Exercises: Aerobic   Stationary Bike Nustep L4x 47mn for ROM (progressed seat forward)   Manual Therapy   Manual Therapy Passive ROM   Passive ROM low load holds for  knee flexion and ext, gentle scar mobilization                     PT Long Term Goals - 05/06/15 1155    PT LONG TERM GOAL #1   Title Ind with HEP.   Time 4   Period Weeks   Status On-going   PT LONG TERM GOAL #2   Title Full activeleft knee extension to normalize gait.   Time 4   Period Weeks   Status On-going  -5 degrees   PT LONG TERM GOAL #3   Title Active left knee flexion to 115 degrees to improve function.   Time 4   Period Weeks   Status On-going  99 degrees   PT LONG TERM GOAL #4   Title 5/5 left knee strength to increase stability for functional activites.   Time 4   Period Weeks   Status On-going   PT LONG TERM GOAL #5   Title Perform ADL's with pain not > 2-3/10.   Time 4   Period Weeks   Status Achieved  3/10   PT LONG TERM GOAL #6   Title Perform a reciprocating stair gait with pain not > 2-3/10.    Time 4   Period Weeks   Status On-going               Plan - 05/06/15 1150    Clinical Impression Statement Patient continues to progress with all activities. Patient has less pain overall, yet has reported stiffness daily. Has improved with ROM today actively and passively. Patient is on antibiotics per MD for some light drainage in bottom of insition. Met LTG#5 patient is able to perform ADL's with pain 3/10 or lower, other goals ongoing due to edema, ROM and strength deficits.   Pt will benefit from skilled therapeutic intervention in order to improve on the following deficits Pain;Decreased activity tolerance;Decreased range of motion;Decreased strength   Rehab Potential Good   Clinical Impairments Affecting Rehab Potential surgery 03/26/15 - current 5 weeks 04/30/15   PT Frequency 3x / week   PT Duration 4 weeks   PT Treatment/Interventions ADLs/Self Care Home Management;Cryotherapy;Electrical Stimulation;Gait training;Neuromuscular re-education;Vasopneumatic Device;Patient/family education;Passive range of motion;Therapeutic exercise   PT Next Visit Plan Left TKA protocol. (MD Alvan Dame 05/08/15)will continue per MD   Consulted and Agree with Plan of Care Patient        Problem List Patient Active Problem List   Diagnosis Date Noted  . Obese 03/27/2015  . S/P left TKA 03/26/2015  . S/P knee replacement 03/26/2015  . Colitis, acute 12/01/2014  . Weight loss, unintentional 12/01/2014  . Adnexal cyst 12/01/2014  . Elevated fasting glucose 12/01/2014  . CAD (coronary artery disease) 12/01/2014  . Osteoarthritis 12/01/2014  . Vasovagal attack 02/28/2013  . Hyperlipidemia 02/28/2013   [name], PTA 05/06/2015 12:04 PM   Narek Kniss P, PTA 05/06/2015, 12:04 PM  [facility] [address] [place], [place], [address] Phone: [phone]   Fax:  [phone]

## 2015-05-06 NOTE — Therapy (Signed)
[facility] [place], [place], [address] Phone: [phone]   Fax:  [phone]  Physical Therapy Treatment  Patient Details  Name: Carol Cox MRN: [id number] Date of Birth: 1950/12/09 Referring Provider:  Christain Sacramento, MD  Encounter Date: 05/06/2015      PT End of Session - 05/06/15 1148    Visit Number 9   Number of Visits 12   Date for PT Re-Evaluation 05/27/15   PT Start Time 1115   PT Stop Time 1204   PT Time Calculation (min) 49 min   Activity Tolerance Patient tolerated treatment well   Behavior During Therapy [facility] for tasks assessed/performed      Past Medical History  Diagnosis Date  . Hyperlipidemia   . Arthritis     "joints of left knee, left hip, ankles" (02/04/2013)  . CAD (coronary artery disease)     Mild nonobstructive CAD by cath 02/06/13 (25% mid LAD), normal EF  . PONV (postoperative nausea and vomiting)   . Family history of adverse reaction to anesthesia     mother has problems with nausea and vomiting     Past Surgical History  Procedure Laterality Date  . Carpal tunnel release Bilateral 1990's  . Left heart cath  02/06/13  . Left heart catheterization with coronary angiogram N/A 02/06/2013    Procedure: LEFT HEART CATHETERIZATION WITH CORONARY ANGIOGRAM;  Surgeon: Carol Hampshire, MD;  Location: [place] CATH LAB;  Service: Cardiovascular;  Laterality: N/A;  . Left knee meniscus surgery     . Total knee arthroplasty Left 03/26/2015    Procedure: LEFT TOTAL KNEE ARTHROPLASTY;  Surgeon: Carol Cancel, MD;  Location: [facility];  Service: Orthopedics;  Laterality: Left;    There were no vitals filed for this visit.  Visit Diagnosis:  Left knee pain - Plan: PT plan of care cert/re-cert  Knee stiffness, left - Plan: PT plan of care cert/re-cert      Subjective Assessment - 05/06/15 1123    Subjective patient continues to report stiff and sore knee   Limitations Standing;Walking;Other (comment)    How long can you sit comfortably? 10 min   How long can you stand comfortably? 10 min   How long can you walk comfortably? 10-20 min   Patient Stated Goals Return to all functional activity without functional limitations   Currently in Pain? Yes   Pain Score 3    Pain Location Knee   Pain Orientation Left   Pain Descriptors / Indicators Constant   Pain Type Surgical pain   Pain Onset 1 to 4 weeks ago   Pain Frequency Intermittent   Aggravating Factors  rom   Pain Relieving Factors rest            [facility] PT Assessment - 05/06/15 0001    AROM   Overall AROM  Deficits   AROM Assessment Site Knee   Right/Left Knee Left   Left Knee Extension -5   Left Knee Flexion 99   PROM   Overall PROM  Deficits;Within functional limits for tasks performed   PROM Assessment Site Knee   Right/Left Knee Left   Left Knee Extension 0   Left Knee Flexion 107                     [facility] Adult PT Treatment/Exercise - 05/06/15 0001    Knee/Hip Exercises: Aerobic   Stationary Bike Nustep L4x 71mn for ROM (progressed seat forward)   Manual Therapy  Manual Therapy Passive ROM   Passive ROM low load holds for knee flexion and ext, gentle scar mobilization                     PT Long Term Goals - 05/06/15 1155    PT LONG TERM GOAL #1   Title Ind with HEP.   Time 4   Period Weeks   Status On-going   PT LONG TERM GOAL #2   Title Full activeleft knee extension to normalize gait.   Time 4   Period Weeks   Status On-going  -5 degrees   PT LONG TERM GOAL #3   Title Active left knee flexion to 115 degrees to improve function.   Time 4   Period Weeks   Status On-going  99 degrees   PT LONG TERM GOAL #4   Title 5/5 left knee strength to increase stability for functional activites.   Time 4   Period Weeks   Status On-going   PT LONG TERM GOAL #5   Title Perform ADL's with pain not > 2-3/10.   Time 4   Period Weeks   Status Achieved  3/10   PT LONG TERM GOAL  #6   Title Perform a reciprocating stair gait with pain not > 2-3/10.   Time 4   Period Weeks   Status On-going               Plan - 05/06/15 1150    Clinical Impression Statement Patient continues to progress with all activities. Patient has less pain overall, yet has reported stiffness daily. Has improved with ROM today actively and passively. Patient is on antibiotics per MD for some light drainage in bottom of insition. Met LTG#5 patient is able to perform ADL's with pain 3/10 or lower, other goals ongoing due to edema, ROM and strength deficits.   Pt will benefit from skilled therapeutic intervention in order to improve on the following deficits Pain;Decreased activity tolerance;Decreased range of motion;Decreased strength   Rehab Potential Good   Clinical Impairments Affecting Rehab Potential surgery 03/26/15 - current 5 weeks 04/30/15   PT Frequency 3x / week   PT Duration 4 weeks   PT Treatment/Interventions ADLs/Self Care Home Management;Cryotherapy;Electrical Stimulation;Gait training;Neuromuscular re-education;Vasopneumatic Device;Patient/family education;Passive range of motion;Therapeutic exercise   PT Next Visit Plan Left TKA protocol. (MD Alvan Dame 05/08/15)will continue per MD   Consulted and Agree with Plan of Care Patient        Problem List Patient Active Problem List   Diagnosis Date Noted  . Obese 03/27/2015  . S/P left TKA 03/26/2015  . S/P knee replacement 03/26/2015  . Colitis, acute 12/01/2014  . Weight loss, unintentional 12/01/2014  . Adnexal cyst 12/01/2014  . Elevated fasting glucose 12/01/2014  . CAD (coronary artery disease) 12/01/2014  . Osteoarthritis 12/01/2014  . Vasovagal attack 02/28/2013  . Hyperlipidemia 02/28/2013    Carol Cox, Carol Cox 05/06/2015, 3:17 PM  [facility] [address] [place], [place], [address] Phone: [phone]   Fax:  [phone]

## 2015-05-09 ENCOUNTER — Encounter: Payer: Self-pay | Admitting: Physical Therapy

## 2015-05-09 ENCOUNTER — Ambulatory Visit: Payer: BC Managed Care – PPO | Admitting: Physical Therapy

## 2015-05-09 DIAGNOSIS — M25662 Stiffness of left knee, not elsewhere classified: Secondary | ICD-10-CM

## 2015-05-09 DIAGNOSIS — M25562 Pain in left knee: Secondary | ICD-10-CM

## 2015-05-09 NOTE — Patient Instructions (Signed)
    Stand on stair step or __6__ inch stool. Slowly bend left leg, lowering other foot to floor. Return by straightening front leg. Repeat __10__ times per set. Do __2-3__ sets per session. Do __2-3__ sessions per day.  Strengthening: Hip Abduction (Side-Lying)  Strengthening: Straight Leg Raise (Phase 1)  Repeat _10___ times per set. Do __2__ sets per session. Do __2__ sessions per day.  Straight Leg Raise   Tighten stomach and slowly raise locked right leg __4__ inches from floor. Repeat __10-30__ times per set. Do __2__ sets per session. Do __2__ sessions per day.

## 2015-05-09 NOTE — Therapy (Signed)
[facility] [place], [place], [address] Phone: [phone]   Fax:  [phone]  Physical Therapy Treatment  Patient Details  Name: Carol Cox MRN: [id number] Date of Birth: 1951-10-08 Referring Provider:  Christain Sacramento, MD  Encounter Date: 05/09/2015      PT End of Session - 05/09/15 1122    Visit Number 10   Number of Visits 12   Date for PT Re-Evaluation 05/27/15   PT Start Time 1118   PT Stop Time 1200   PT Time Calculation (min) 42 min   Activity Tolerance Patient tolerated treatment well   Behavior During Therapy [facility] for tasks assessed/performed      Past Medical History  Diagnosis Date  . Hyperlipidemia   . Arthritis     "joints of left knee, left hip, ankles" (02/04/2013)  . CAD (coronary artery disease)     Mild nonobstructive CAD by cath 02/06/13 (25% mid LAD), normal EF  . PONV (postoperative nausea and vomiting)   . Family history of adverse reaction to anesthesia     mother has problems with nausea and vomiting     Past Surgical History  Procedure Laterality Date  . Carpal tunnel release Bilateral 1990's  . Left heart cath  02/06/13  . Left heart catheterization with coronary angiogram N/A 02/06/2013    Procedure: LEFT HEART CATHETERIZATION WITH CORONARY ANGIOGRAM;  Surgeon: [name] Hampshire, MD;  Location: [place] CATH LAB;  Service: Cardiovascular;  Laterality: N/A;  . Left knee meniscus surgery     . Total knee arthroplasty Left 03/26/2015    Procedure: LEFT TOTAL KNEE ARTHROPLASTY;  Surgeon: Paralee Cancel, MD;  Location: [facility];  Service: Orthopedics;  Laterality: Left;    There were no vitals filed for this visit.  Visit Diagnosis:  Left knee pain  Knee stiffness, left      Subjective Assessment - 05/09/15 1120    Subjective Patient went to MD and today is the last day of therapy per MD    Limitations Standing;Walking;Other (comment)   How long can you sit comfortably? 10 min   How long  can you stand comfortably? 10 min   How long can you walk comfortably? 10-20 min   Patient Stated Goals Return to all functional activity without functional limitations   Currently in Pain? Yes   Pain Score 3    Pain Location Knee   Pain Orientation Left   Pain Descriptors / Indicators Tightness;Sore   Pain Type Surgical pain   Pain Onset More than a month ago   Pain Frequency Intermittent   Aggravating Factors  ROM   Pain Relieving Factors rest            [facility] PT Assessment - 05/09/15 0001    AROM   Overall AROM  Deficits   AROM Assessment Site Knee   Right/Left Knee Left   Left Knee Extension -3   Left Knee Flexion 102   PROM   Overall PROM  Deficits;Within functional limits for tasks performed   PROM Assessment Site Knee   Right/Left Knee Left   Left Knee Extension 0   Left Knee Flexion 110                     [facility] Adult PT Treatment/Exercise - 05/09/15 0001    Knee/Hip Exercises: Aerobic   Stationary Bike x7mn for ROM   Electrical Stimulation   Electrical Stimulation Location left knee   Electrical Stimulation Action  premod   Electrical Stimulation Parameters 1-10hz    Electrical Stimulation Goals Pain   Vasopneumatic   Number Minutes Vasopneumatic  10 minutes   Vasopnuematic Location  Knee   Vasopneumatic Pressure Medium   Manual Therapy   Manual Therapy Passive ROM   Passive ROM low load holds for knee flexion and ext, gentle scar mobilization                PT Education - 05/09/15 1145    Education Details HEP strength   Person(s) Educated Patient   Methods Explanation;Demonstration;Handout   Comprehension Verbalized understanding;Returned demonstration             PT Long Term Goals - 05/09/15 1123    PT LONG TERM GOAL #1   Title Ind with HEP.   Time 4   Period Weeks   Status Achieved   PT LONG TERM GOAL #2   Title Full activeleft knee extension to normalize gait.   Time 4   Period Weeks   Status Not Met  -3  degrees   PT LONG TERM GOAL #3   Title Active left knee flexion to 115 degrees to improve function.   Time 4   Period Weeks   Status Not Met  102 degrees   PT LONG TERM GOAL #4   Title 5/5 left knee strength to increase stability for functional activites.   Time 4   Period Weeks   Status Achieved  5/5   PT LONG TERM GOAL #5   Title Perform ADL's with pain not > 2-3/10.   Time 4   Period Weeks   Status Achieved   PT LONG TERM GOAL #6   Title Perform a reciprocating stair gait with pain not > 2-3/10.   Time 4   Period Weeks   Status Achieved               Plan - 05/09/15 1146    Clinical Impression Statement Patient has met all current goals except #2 and #3 ROM goals due to full range limitations. Patient has 5/5 knee strength and has improved with decreased edema and increased ROM. Patient is able to perform a reciprocating stair gait with no difficulty. Patient is independent with self ROM and strength HEP.   Pt will benefit from skilled therapeutic intervention in order to improve on the following deficits Pain;Decreased activity tolerance;Decreased range of motion;Decreased strength   Rehab Potential Good   Clinical Impairments Affecting Rehab Potential surgery 03/26/15 - current 6 weeks 05/09/15   PT Frequency 3x / week   PT Duration 4 weeks   PT Treatment/Interventions ADLs/Self Care Home Management;Cryotherapy;Electrical Stimulation;Gait training;Neuromuscular re-education;Vasopneumatic Device;Patient/family education;Passive range of motion;Therapeutic exercise   PT Next Visit Plan DC per Dr. Phillips Grout and Agree with Plan of Care Patient        Problem List Patient Active Problem List   Diagnosis Date Noted  . Obese 03/27/2015  . S/P left TKA 03/26/2015  . S/P knee replacement 03/26/2015  . Colitis, acute 12/01/2014  . Weight loss, unintentional 12/01/2014  . Adnexal cyst 12/01/2014  . Elevated fasting glucose 12/01/2014  . CAD (coronary artery  disease) 12/01/2014  . Osteoarthritis 12/01/2014  . Vasovagal attack 02/28/2013  . Hyperlipidemia 02/28/2013  [name], PTA 05/09/2015 12:00 PM    DUNFORD, CHRISTINA P, PTA 05/09/2015, 12:00 PM  [facility] [address] [place], [place], [address] Phone: [phone]   Fax:  [phone]

## 2015-05-13 NOTE — Therapy (Signed)
[facility] [place], [place], [address] Phone: [phone]   Fax:  [phone]  Physical Therapy Treatment  Patient Details  Name: Carol Cox MRN: [id number] Date of Birth: 1950-12-18 Referring Provider:  Christain Sacramento, MD  Encounter Date: 05/09/2015    Past Medical History  Diagnosis Date  . Hyperlipidemia   . Arthritis     "joints of left knee, left hip, ankles" (02/04/2013)  . CAD (coronary artery disease)     Mild nonobstructive CAD by cath 02/06/13 (25% mid LAD), normal EF  . PONV (postoperative nausea and vomiting)   . Family history of adverse reaction to anesthesia     mother has problems with nausea and vomiting     Past Surgical History  Procedure Laterality Date  . Carpal tunnel release Bilateral 1990's  . Left heart cath  02/06/13  . Left heart catheterization with coronary angiogram N/A 02/06/2013    Procedure: LEFT HEART CATHETERIZATION WITH CORONARY ANGIOGRAM;  Surgeon: [name] Hampshire, MD;  Location: [facility];  Service: Cardiovascular;  Laterality: N/A;  . Left knee meniscus surgery     . Total knee arthroplasty Left 03/26/2015    Procedure: LEFT TOTAL KNEE ARTHROPLASTY;  Surgeon: Paralee Cancel, MD;  Location: [facility];  Service: Orthopedics;  Laterality: Left;    There were no vitals filed for this visit.  Visit Diagnosis:  Left knee pain  Knee stiffness, left                                    PT Long Term Goals - 05/09/15 1123    PT LONG TERM GOAL #1   Title Ind with HEP.   Time 4   Period Weeks   Status Achieved   PT LONG TERM GOAL #2   Title Full activeleft knee extension to normalize gait.   Time 4   Period Weeks   Status Not Met  -3 degrees   PT LONG TERM GOAL #3   Title Active left knee flexion to 115 degrees to improve function.   Time 4   Period Weeks   Status Not Met  102 degrees   PT LONG TERM GOAL #4   Title 5/5 left knee strength to  increase stability for functional activites.   Time 4   Period Weeks   Status Achieved  5/5   PT LONG TERM GOAL #5   Title Perform ADL's with pain not > 2-3/10.   Time 4   Period Weeks   Status Achieved   PT LONG TERM GOAL #6   Title Perform a reciprocating stair gait with pain not > 2-3/10.   Time 4   Period Weeks   Status Achieved      PHYSICAL THERAPY DISCHARGE SUMMARY  Visits from Start of Care: 10  Current functional level related to goals / functional outcomes: Please see above.   Remaining deficits: AROM:  -3 to 102 degrees.   Education / Equipment: HEP. Plan: Patient agrees to discharge.  Patient goals were partially met. Patient is being discharged due to the physician's request.  ?????                Problem List Patient Active Problem List   Diagnosis Date Noted  . Obese 03/27/2015  . S/P left TKA 03/26/2015  . S/P knee replacement 03/26/2015  . Colitis, acute 12/01/2014  . Weight loss, unintentional  12/01/2014  . Adnexal cyst 12/01/2014  . Elevated fasting glucose 12/01/2014  . CAD (coronary artery disease) 12/01/2014  . Osteoarthritis 12/01/2014  . Vasovagal attack 02/28/2013  . Hyperlipidemia 02/28/2013    Darl Brisbin, [place] MPT 05/13/2015, 11:37 AM  [facility] [address] [place], [place], [address] Phone: [phone]   Fax:  [phone]

## 2015-05-14 ENCOUNTER — Encounter: Payer: BC Managed Care – PPO | Admitting: Physical Therapy

## 2015-05-16 ENCOUNTER — Encounter: Payer: BC Managed Care – PPO | Admitting: Physical Therapy

## 2015-07-26 ENCOUNTER — Ambulatory Visit: Payer: BC Managed Care – PPO | Admitting: Cardiology

## 2015-10-23 ENCOUNTER — Other Ambulatory Visit: Payer: Self-pay | Admitting: Cardiology

## 2016-05-18 NOTE — H&P (Addendum)
Carol Cox is an 65 y.o. female with LLQ pain and left ovarian cyst presents for surgical mngt  Menstrual History:  No LMP recorded. Patient is postmenopausal.    Past Medical History:  Diagnosis Date  . Arthritis    "joints of left knee, left hip, ankles" (02/04/2013)  . CAD (coronary artery disease)    Mild nonobstructive CAD by cath 02/06/13 (25% mid LAD), normal EF  . Family history of adverse reaction to anesthesia    mother has problems with nausea and vomiting   . Hyperlipidemia   . PONV (postoperative nausea and vomiting)     Past Surgical History:  Procedure Laterality Date  . CARPAL TUNNEL RELEASE Bilateral 1990's  . LEFT HEART CATH  02/06/13  . LEFT HEART CATHETERIZATION WITH CORONARY ANGIOGRAM N/A 02/06/2013   Procedure: LEFT HEART CATHETERIZATION WITH CORONARY ANGIOGRAM;  Surgeon: [name] Hampshire, MD;  Location: [facility];  Service: Cardiovascular;  Laterality: N/A;  . left knee meniscus surgery     . TOTAL KNEE ARTHROPLASTY Left 03/26/2015   Procedure: LEFT TOTAL KNEE ARTHROPLASTY;  Surgeon: Paralee Cancel, MD;  Location: [facility];  Service: Orthopedics;  Laterality: Left;    Family History  Problem Relation Age of Onset  . Sudden death Father 19    Presumed heart attack  . CAD Sister 16  . Heart attack Sister 88  . CAD Maternal Uncle 43  . CAD Maternal Uncle 38  . Heart attack Sister 41  . Heart attack Maternal Uncle 42  . Heart attack Maternal Uncle 50  . CVA Mother     Social History:  reports that she has never smoked. She has never used smokeless tobacco. She reports that she does not drink alcohol or use drugs.  Allergies:  Allergies  Allergen Reactions  . Crestor [Rosuvastatin] Other (See Comments)    Leg pain  . Lipitor [Atorvastatin] Other (See Comments)    Leg pain    No prescriptions prior to admission.    ROS  AF, VSS Physical Exam  Gen - NAD Abd - soft, NT CV - RRR Lungs clear PV - left adnexal fullness  [place]:  4.4 cm left  ovarian cyst CA 125 wnl  Assessment/Plan: Left ovarian cyst Plan for laparoscopic left oophorectomy R/b/a discussed, questions answered, informed consent Faust Thorington 05/18/2016, 2:08 PM

## 2016-05-22 ENCOUNTER — Encounter (HOSPITAL_BASED_OUTPATIENT_CLINIC_OR_DEPARTMENT_OTHER): Payer: Self-pay | Admitting: *Deleted

## 2016-05-22 NOTE — Progress Notes (Addendum)
SPOKE W/ PT TODAY, STATED SHE OUT OF TOWN UNTIL HER SURGERY.  NPO AFTER MN.  ARRIVE AT 0600.  NEEDS CBC (LM FOR DR ADKINS TO VERIFY IF T&S NEEDED, TODAY).    RECEIVED CALL BACK FROM OFFICE, DR ADKINS STATED WANT T & S AND CBC.

## 2016-06-02 ENCOUNTER — Encounter (HOSPITAL_BASED_OUTPATIENT_CLINIC_OR_DEPARTMENT_OTHER)
Admission: RE | Disposition: A | Discharge status: Home / Self Care | Payer: Self-pay | Source: Ambulatory Visit | Attending: Obstetrics and Gynecology

## 2016-06-02 ENCOUNTER — Ambulatory Visit (HOSPITAL_BASED_OUTPATIENT_CLINIC_OR_DEPARTMENT_OTHER)
Admission: RE | Admit: 2016-06-02 | Discharge: 2016-06-02 | Disposition: A | Discharge status: Home / Self Care | Payer: BC Managed Care – PPO | Source: Ambulatory Visit | Attending: Obstetrics and Gynecology | Admitting: Obstetrics and Gynecology

## 2016-06-02 ENCOUNTER — Encounter (HOSPITAL_BASED_OUTPATIENT_CLINIC_OR_DEPARTMENT_OTHER): Payer: Self-pay | Admitting: *Deleted

## 2016-06-02 ENCOUNTER — Ambulatory Visit (HOSPITAL_BASED_OUTPATIENT_CLINIC_OR_DEPARTMENT_OTHER): Payer: BC Managed Care – PPO | Admitting: Anesthesiology

## 2016-06-02 DIAGNOSIS — I2510 Atherosclerotic heart disease of native coronary artery without angina pectoris: Secondary | ICD-10-CM | POA: Insufficient documentation

## 2016-06-02 DIAGNOSIS — E785 Hyperlipidemia, unspecified: Secondary | ICD-10-CM | POA: Diagnosis not present

## 2016-06-02 DIAGNOSIS — D271 Benign neoplasm of left ovary: Secondary | ICD-10-CM | POA: Diagnosis not present

## 2016-06-02 DIAGNOSIS — N83202 Unspecified ovarian cyst, left side: Secondary | ICD-10-CM | POA: Diagnosis present

## 2016-06-02 DIAGNOSIS — Z96652 Presence of left artificial knee joint: Secondary | ICD-10-CM | POA: Insufficient documentation

## 2016-06-02 HISTORY — DX: Preglaucoma, unspecified, unspecified eye: H40.009

## 2016-06-02 HISTORY — DX: Personal history of other diseases of the digestive system: Z87.19

## 2016-06-02 HISTORY — DX: Unspecified ovarian cyst, left side: N83.202

## 2016-06-02 LAB — TYPE AND SCREEN
ABO/RH(D): A POS
Antibody Screen: NEGATIVE

## 2016-06-02 LAB — CBC
HCT: 41.6 % (ref 36.0–46.0)
Hemoglobin: 14.3 g/dL (ref 12.0–15.0)
MCH: 32.2 pg (ref 26.0–34.0)
MCHC: 34.4 g/dL (ref 30.0–36.0)
MCV: 93.7 fL (ref 78.0–100.0)
PLATELETS: 219 10*3/uL (ref 150–400)
RBC: 4.44 MIL/uL (ref 3.87–5.11)
RDW: 13.6 % (ref 11.5–15.5)
WBC: 6 10*3/uL (ref 4.0–10.5)

## 2016-06-02 SURGERY — OOPHORECTOMY, LAPAROSCOPIC
Anesthesia: General | Site: Abdomen | Laterality: Left

## 2016-06-02 MED ORDER — BUPIVACAINE HCL 0.25 % IJ SOLN
INTRAMUSCULAR | Status: DC | PRN
  Administered 2016-06-02: 10 mL

## 2016-06-02 MED ORDER — LIDOCAINE HCL (CARDIAC) 20 MG/ML IV SOLN
INTRAVENOUS | Status: AC
  Filled 2016-06-02: qty 5

## 2016-06-02 MED ORDER — KETOROLAC TROMETHAMINE 30 MG/ML IJ SOLN
INTRAMUSCULAR | Status: DC | PRN
  Administered 2016-06-02: 30 mg via INTRAVENOUS

## 2016-06-02 MED ORDER — SCOPOLAMINE 1 MG/3DAYS TD PT72
MEDICATED_PATCH | TRANSDERMAL | Status: AC
  Filled 2016-06-02: qty 1

## 2016-06-02 MED ORDER — FENTANYL CITRATE (PF) 100 MCG/2ML IJ SOLN
INTRAMUSCULAR | Status: AC
  Filled 2016-06-02: qty 4

## 2016-06-02 MED ORDER — FENTANYL CITRATE (PF) 100 MCG/2ML IJ SOLN
25.0000 ug | INTRAMUSCULAR | Status: DC | PRN
  Filled 2016-06-02: qty 1

## 2016-06-02 MED ORDER — PROPOFOL 10 MG/ML IV BOLUS
INTRAVENOUS | Status: AC
  Filled 2016-06-02: qty 40

## 2016-06-02 MED ORDER — ONDANSETRON HCL 4 MG/2ML IJ SOLN
INTRAMUSCULAR | Status: AC
  Filled 2016-06-02: qty 2

## 2016-06-02 MED ORDER — DEXAMETHASONE SODIUM PHOSPHATE 4 MG/ML IJ SOLN
INTRAMUSCULAR | Status: DC | PRN
  Administered 2016-06-02: 10 mg via INTRAVENOUS

## 2016-06-02 MED ORDER — LACTATED RINGERS IV SOLN
INTRAVENOUS | Status: DC
  Administered 2016-06-02: 07:00:00 via INTRAVENOUS
  Filled 2016-06-02: qty 1000

## 2016-06-02 MED ORDER — ONDANSETRON HCL 4 MG/2ML IJ SOLN
INTRAMUSCULAR | Status: DC | PRN
  Administered 2016-06-02: 4 mg via INTRAVENOUS

## 2016-06-02 MED ORDER — DEXTROSE 5 % IV SOLN
2.0000 g | INTRAVENOUS | Status: DC
  Filled 2016-06-02: qty 2

## 2016-06-02 MED ORDER — DEXAMETHASONE SODIUM PHOSPHATE 10 MG/ML IJ SOLN
INTRAMUSCULAR | Status: AC
  Filled 2016-06-02: qty 1

## 2016-06-02 MED ORDER — OXYCODONE-ACETAMINOPHEN 5-325 MG PO TABS: 1.0000 | ORAL_TABLET | ORAL | 0 refills | Status: DC | PRN

## 2016-06-02 MED ORDER — SCOPOLAMINE 1 MG/3DAYS TD PT72
1.0000 | MEDICATED_PATCH | TRANSDERMAL | Status: DC
  Administered 2016-06-02: 1.5 mg via TRANSDERMAL
  Filled 2016-06-02: qty 1

## 2016-06-02 MED ORDER — FENTANYL CITRATE (PF) 100 MCG/2ML IJ SOLN
INTRAMUSCULAR | Status: DC | PRN
  Administered 2016-06-02 (×2): 50 ug via INTRAVENOUS

## 2016-06-02 MED ORDER — KETOROLAC TROMETHAMINE 30 MG/ML IJ SOLN
30.0000 mg | Freq: Once | INTRAMUSCULAR | Status: DC | PRN
  Filled 2016-06-02: qty 1

## 2016-06-02 MED ORDER — SUGAMMADEX SODIUM 200 MG/2ML IV SOLN
INTRAVENOUS | Status: DC | PRN
  Administered 2016-06-02: 200 mg via INTRAVENOUS

## 2016-06-02 MED ORDER — MIDAZOLAM HCL 2 MG/2ML IJ SOLN
INTRAMUSCULAR | Status: AC
  Filled 2016-06-02: qty 2

## 2016-06-02 MED ORDER — OXYCODONE HCL 5 MG PO TABS
5.0000 mg | ORAL_TABLET | Freq: Once | ORAL | Status: DC | PRN
  Filled 2016-06-02: qty 1

## 2016-06-02 MED ORDER — CEFOTETAN DISODIUM-DEXTROSE 2-2.08 GM-% IV SOLR
INTRAVENOUS | Status: AC
  Filled 2016-06-02: qty 50

## 2016-06-02 MED ORDER — KETOROLAC TROMETHAMINE 30 MG/ML IJ SOLN
INTRAMUSCULAR | Status: AC
  Filled 2016-06-02: qty 1

## 2016-06-02 MED ORDER — IBUPROFEN 600 MG PO TABS: 600.0000 mg | ORAL_TABLET | Freq: Four times a day (QID) | ORAL | 0 refills | Status: DC | PRN

## 2016-06-02 MED ORDER — ROCURONIUM BROMIDE 100 MG/10ML IV SOLN
INTRAVENOUS | Status: AC
  Filled 2016-06-02: qty 1

## 2016-06-02 MED ORDER — PROPOFOL 10 MG/ML IV BOLUS
INTRAVENOUS | Status: DC | PRN
  Administered 2016-06-02: 50 mg via INTRAVENOUS
  Administered 2016-06-02: 150 mg via INTRAVENOUS

## 2016-06-02 MED ORDER — LIDOCAINE 2% (20 MG/ML) 5 ML SYRINGE
INTRAMUSCULAR | Status: DC | PRN
  Administered 2016-06-02: 60 mg via INTRAVENOUS

## 2016-06-02 MED ORDER — OXYCODONE HCL 5 MG/5ML PO SOLN
5.0000 mg | Freq: Once | ORAL | Status: DC | PRN
  Filled 2016-06-02: qty 5

## 2016-06-02 MED ORDER — ROCURONIUM BROMIDE 100 MG/10ML IV SOLN
INTRAVENOUS | Status: DC | PRN
  Administered 2016-06-02: 10 mg via INTRAVENOUS
  Administered 2016-06-02: 50 mg via INTRAVENOUS

## 2016-06-02 MED ORDER — SUGAMMADEX SODIUM 200 MG/2ML IV SOLN
INTRAVENOUS | Status: AC
  Filled 2016-06-02: qty 2

## 2016-06-02 MED ORDER — PROMETHAZINE HCL 25 MG/ML IJ SOLN
6.2500 mg | INTRAMUSCULAR | Status: DC | PRN
  Filled 2016-06-02: qty 1

## 2016-06-02 MED ORDER — MIDAZOLAM HCL 5 MG/5ML IJ SOLN
INTRAMUSCULAR | Status: DC | PRN
  Administered 2016-06-02: 2 mg via INTRAVENOUS

## 2016-06-02 SURGICAL SUPPLY — 54 items
APPLIER CLIP LOGIC TI 5 (MISCELLANEOUS) ×8 IMPLANT
APR CLP MED LRG 33X5 (MISCELLANEOUS) ×4
BAG SPEC RTRVL LRG 6X4 10 (ENDOMECHANICALS) ×2
BLADE SURG 11 STRL SS (BLADE) ×4 IMPLANT
CANISTER SUCTION 1200CC (MISCELLANEOUS) IMPLANT
CANISTER SUCTION 2500CC (MISCELLANEOUS) IMPLANT
CATH ROBINSON RED A/P 16FR (CATHETERS) ×4 IMPLANT
CHLORAPREP W/TINT 26ML (MISCELLANEOUS) ×4 IMPLANT
CLOSURE WOUND 1/2 X4 (GAUZE/BANDAGES/DRESSINGS)
DRAPE UNDERBUTTOCKS STRL (DRAPE) ×4 IMPLANT
ELECT REM PT RETURN 9FT ADLT (ELECTROSURGICAL) ×4
ELECTRODE REM PT RTRN 9FT ADLT (ELECTROSURGICAL) ×2 IMPLANT
GLOVE BIO SURGEON STRL SZ 6.5 (GLOVE) ×5 IMPLANT
GLOVE BIO SURGEON STRL SZ7 (GLOVE) ×4 IMPLANT
GLOVE BIO SURGEONS STRL SZ 6.5 (GLOVE) ×2
GLOVE INDICATOR 6.5 STRL GRN (GLOVE) ×3 IMPLANT
GLOVE INDICATOR 7.0 STRL GRN (GLOVE) ×4 IMPLANT
GOWN STRL REUS W/ TWL LRG LVL3 (GOWN DISPOSABLE) ×3 IMPLANT
GOWN STRL REUS W/TWL LRG LVL3 (GOWN DISPOSABLE) ×8
KIT ROOM TURNOVER WOR (KITS) ×4 IMPLANT
LEGGING LITHOTOMY PAIR STRL (DRAPES) ×4 IMPLANT
LIQUID BAND (GAUZE/BANDAGES/DRESSINGS) ×4 IMPLANT
NDL HYPO 25X1 1.5 SAFETY (NEEDLE) ×1 IMPLANT
NDL INSUFFLATION 14GA 120MM (NEEDLE) IMPLANT
NDL INSUFFLATION 14GA 150MM (NEEDLE) IMPLANT
NEEDLE HYPO 25X1 1.5 SAFETY (NEEDLE) ×4 IMPLANT
NEEDLE INSUFFLATION 14GA 120MM (NEEDLE) IMPLANT
NEEDLE INSUFFLATION 14GA 150MM (NEEDLE) IMPLANT
NS IRRIG 500ML POUR BTL (IV SOLUTION) ×4 IMPLANT
PACK BASIN DAY SURGERY FS (CUSTOM PROCEDURE TRAY) ×4 IMPLANT
PACK LAPAROSCOPY II (CUSTOM PROCEDURE TRAY) ×4 IMPLANT
PAD OB MATERNITY 4.3X12.25 (PERSONAL CARE ITEMS) ×4 IMPLANT
PAD PREP 24X48 CUFFED NSTRL (MISCELLANEOUS) ×4 IMPLANT
POUCH SPECIMEN RETRIEVAL 10MM (ENDOMECHANICALS) ×3 IMPLANT
SEALER TISSUE G2 CVD JAW 35 (ENDOMECHANICALS) IMPLANT
SEALER TISSUE G2 CVD JAW 45CM (ENDOMECHANICALS) ×3 IMPLANT
SET IRRIG TUBING LAPAROSCOPIC (IRRIGATION / IRRIGATOR) IMPLANT
SOLUTION ANTI FOG 6CC (MISCELLANEOUS) ×4 IMPLANT
STRIP CLOSURE SKIN 1/2X4 (GAUZE/BANDAGES/DRESSINGS) IMPLANT
SUT VIC AB 3-0 PS2 18 (SUTURE) ×4
SUT VIC AB 3-0 PS2 18XBRD (SUTURE) ×2 IMPLANT
SUT VICRYL 0 UR6 27IN ABS (SUTURE) ×7 IMPLANT
SYR 3ML 23GX1 SAFETY (SYRINGE) IMPLANT
SYR CONTROL 10ML LL (SYRINGE) ×4 IMPLANT
SYRINGE 10CC LL (SYRINGE) ×8 IMPLANT
TOWEL OR 17X24 6PK STRL BLUE (TOWEL DISPOSABLE) ×8 IMPLANT
TRAY DSU PREP LF (CUSTOM PROCEDURE TRAY) ×4 IMPLANT
TROCAR OPTI TIP 5M 100M (ENDOMECHANICALS) ×4 IMPLANT
TUBE CONNECTING 12'X1/4 (SUCTIONS)
TUBE CONNECTING 12X1/4 (SUCTIONS) IMPLANT
TUBING INSUFFLATION 10FT LAP (TUBING) ×4 IMPLANT
VACUUM HOSE/TUBING 7/8INX6FT (MISCELLANEOUS) IMPLANT
WARMER LAPAROSCOPE (MISCELLANEOUS) ×4 IMPLANT
WATER STERILE IRR 500ML POUR (IV SOLUTION) ×1 IMPLANT

## 2016-06-02 NOTE — Op Note (Signed)
NAMERogelio Seen NO.:  [id number]  MEDICAL RECORD NO.:  [id number]  LOCATION:                                 FACILITY:  PHYSICIAN:  Marylynn Pearson, MD         DATE OF BIRTH:  DATE OF PROCEDURE:  06/02/2016 DATE OF DISCHARGE:                              OPERATIVE REPORT   PREOPERATIVE DIAGNOSIS:  Left ovarian cyst.  POSTOPERATIVE DIAGNOSIS:  Left ovarian cyst.  PROCEDURE:  Laparoscopic left oophorectomy with partial left salpingectomy.  SURGEON:  Marylynn Pearson, MD.  ANESTHESIA:  General.  SPECIMEN:  Left ovary and portion of left fallopian tube.  BLOOD LOSS:  75 mL.  COMPLICATIONS:  None.  CONDITION:  Stable to recovery room.  PROCEDURE:  The patient was taken to the operating room.  After informed consent was obtained, she was given general anesthesia.  Placed in the dorsal lithotomy position using Allen stirrups.  She was prepped and draped in sterile fashion.  In and out catheter was used to drain her bladder.  Bivalve speculum was placed in the vagina and a Hulka clamp was placed on the cervix.  Speculum was removed and our attention was turned to the abdomen.  0.25% Marcaine was used to provide local anesthesia at the site of our infraumbilical and suprapubic incisions. A suprapubic incision was made with a scalpel and extended bluntly to the level of the fascia using a Kelly clamp.  Optical trocar was inserted under direct visualization.  Once intraperitoneal placement was confirmed, CO2 was turned on and the abdomen was insufflated uterus, right ovary, and right fallopian tube appeared normal.  Left ovary was enlarged with what appeared to be a simple cyst.  The left distal fallopian tube was adhered to the left ovary.  The suprapubic incision was made with a scalpel, and a 10 mm trocar was inserted under direct visualization.  The left ovary was tented upwards and toward the midline.  Instill device was used to grasp, cauterize  and cut the infundibulopelvic ligament.  This was extended up the mesial salpinx and across the fallopian tube, and utero-ovarian ligament.  The ovary was placed in a specimen bag and removed from the suprapubic incision. Pedicles were reinspected and found to be hemostatic.  The fascia was then closed with Vicryl and the skin was closed with Vicryl.  Dermabond was placed.  All instruments and trocars were then removed from the infraumbilical incision.  A deep stitch was placed with Vicryl and the skin was closed with a subcutaneous suture, skin glue was placed.  The Hulka clamp was removed. The patient was extubated and taken to the recovery room in stable condition.  Sponge, lap, needle, and instrument counts were correct x2.     Marylynn Pearson, MD     GA/MEDQ  D:  06/02/2016  T:  06/02/2016  Job:  [id number]

## 2016-06-02 NOTE — Anesthesia Procedure Notes (Signed)
Procedure Name: Intubation Date/Time: 06/02/2016 7:41 AM Performed by: Bethena Roys T Pre-anesthesia Checklist: Patient identified, Emergency Drugs available, Suction available and Patient being monitored Patient Re-evaluated:Patient Re-evaluated prior to inductionOxygen Delivery Method: Circle system utilized Preoxygenation: Pre-oxygenation with 100% oxygen Intubation Type: IV induction Ventilation: Mask ventilation without difficulty Laryngoscope Size: Mac and 3 Grade View: Grade III Tube type: Oral Number of attempts: 2 Airway Equipment and Method: Stylet and Oral airway Placement Confirmation: ETT inserted through vocal cords under direct vision,  positive ETCO2 and breath sounds checked- equal and bilateral Secured at: 21 cm Tube secured with: Tape Dental Injury: Teeth and Oropharynx as per pre-operative assessment and Injury to lip  Comments: Small cut noted on upper L lip, no tooth damage

## 2016-06-02 NOTE — Discharge Instructions (Signed)
°  Post Anesthesia Home Care Instructions ° °Activity: °Get plenty of rest for the remainder of the day. A responsible adult should stay with you for 24 hours following the procedure.  °For the next 24 hours, DO NOT: °-Drive a car °-Operate machinery °-Drink alcoholic beverages °-Take any medication unless instructed by your physician °-Make any legal decisions or sign important papers. ° °Meals: °Start with liquid foods such as gelatin or soup. Progress to regular foods as tolerated. Avoid greasy, spicy, heavy foods. If nausea and/or vomiting occur, drink only clear liquids until the nausea and/or vomiting subsides. Call your physician if vomiting continues. ° °Special Instructions/Symptoms: °Your throat may feel dry or sore from the anesthesia or the breathing tube placed in your throat during surgery. If this causes discomfort, gargle with warm salt water. The discomfort should disappear within 24 hours. ° °If you had a scopolamine patch placed behind your ear for the management of post- operative nausea and/or vomiting: ° °1. The medication in the patch is effective for 72 hours, after which it should be removed.  Wrap patch in a tissue and discard in the trash. Wash hands thoroughly with soap and water. °2. You may remove the patch earlier than 72 hours if you experience unpleasant side effects which may include dry mouth, dizziness or visual disturbances. °3. Avoid touching the patch. Wash your hands with soap and water after contact with the patch. °  °HOME CARE INSTRUCTIONS - LAPAROSCOPY ° °Wound Care: The bandaids or dressing which are placed over the skin openings may be removed the day after surgery. The incision should be kept clean and dry. The stitches do not need to be removed. Should the incision become sore, red, and swollen after the first week, check with your doctor. ° °Personal Hygiene: Shower the day after your procedure. Always wipe from front to back after elimination.  ° °Activity: Do not  drive or operate any equipment today. The effects of the anesthesia are still present and drowsiness may result. Rest today, not necessarily flat bed rest, just take it easy. You may resume your normal activity in one to three days or as instructed by your physician. ° °Sexual Activity: You resume sexual activity as indicated by your physician_________. °If your laparoscopy was for a sterilization ( tubes tied ), continue current method of birth control until after your next period or ask for specific instructions from your doctor. ° °Diet: Eat a light diet as desired this evening. You may resume a regular diet tomorrow. ° °Return to Work: Two to three days or as indicated by your doctor. ° °Expectations °After Surgery: Your surgery will cause vaginal drainage or spotting which may continue for 2-3 days. Mild abdominal discomfort or tenderness is not unusual and some shoulder pain may also be noted which can be relieved by lying flat in pain. ° °Call Your Doctor °If these Occur:  Persistent or heavy bleeding at incision site °      Redness or swelling around incision °      Elevation of temperature greater than 100 degrees F ° °Call for follow-up appointment _____________. °

## 2016-06-02 NOTE — Anesthesia Postprocedure Evaluation (Signed)
Anesthesia Post Note  Patient: Carol Cox  Procedure(s) Performed: Procedure(s) (LRB): LAPAROSCOPIC Left Partial Salpingectomy OOPHORECTOMY (Left)  Patient location during evaluation: PACU Anesthesia Type: General Level of consciousness: awake and alert Pain management: pain level controlled Vital Signs Assessment: post-procedure vital signs reviewed and stable Respiratory status: spontaneous breathing, nonlabored ventilation, respiratory function stable and patient connected to nasal cannula oxygen Cardiovascular status: blood pressure returned to baseline and stable Postop Assessment: no signs of nausea or vomiting Anesthetic complications: no    Last Vitals:  Vitals:   06/02/16 0842 06/02/16 0845  BP: 128/72 136/70  Pulse: 80 77  Resp: 10 16  Temp: 36.9 C     Last Pain:  Vitals:   06/02/16 0842  TempSrc:   PainSc: 0-No pain                 Jaylei Fuerte S

## 2016-06-02 NOTE — Anesthesia Preprocedure Evaluation (Signed)
Anesthesia Evaluation  Patient identified by MRN, date of birth, ID band Patient awake    Reviewed: Allergy & Precautions, NPO status , Patient's Chart, lab work & pertinent test results  History of Anesthesia Complications (+) PONV  Airway Mallampati: II  TM Distance: >3 FB Neck ROM: Full    Dental no notable dental hx.    Pulmonary neg pulmonary ROS,    Pulmonary exam normal breath sounds clear to auscultation       Cardiovascular negative cardio ROS Normal cardiovascular exam Rhythm:Regular Rate:Normal     Neuro/Psych negative neurological ROS  negative psych ROS   GI/Hepatic negative GI ROS, Neg liver ROS,   Endo/Other  negative endocrine ROS  Renal/GU negative Renal ROS  negative genitourinary   Musculoskeletal negative musculoskeletal ROS (+)   Abdominal   Peds negative pediatric ROS (+)  Hematology negative hematology ROS (+)   Anesthesia Other Findings   Reproductive/Obstetrics negative OB ROS                             Anesthesia Physical Anesthesia Plan  ASA: II  Anesthesia Plan: General   Post-op Pain Management:    Induction: Intravenous  Airway Management Planned: Oral ETT  Additional Equipment:   Intra-op Plan:   Post-operative Plan: Extubation in OR  Informed Consent: I have reviewed the patients History and Physical, chart, labs and discussed the procedure including the risks, benefits and alternatives for the proposed anesthesia with the patient or authorized representative who has indicated his/her understanding and acceptance.   Dental advisory given  Plan Discussed with: CRNA and Surgeon  Anesthesia Plan Comments:         Anesthesia Quick Evaluation

## 2016-06-02 NOTE — Transfer of Care (Signed)
Immediate Anesthesia Transfer of Care Note  Patient: Carol Cox  Procedure(s) Performed: Procedure(s): LAPAROSCOPIC Left Partial Salpingectomy OOPHORECTOMY (Left)  Patient Location: PACU  Anesthesia Type:General  Level of Consciousness: awake and oriented  Airway & Oxygen Therapy: Patient Spontanous Breathing and Patient connected to nasal cannula oxygen  Post-op Assessment: Report given to RN and Post -op Vital signs reviewed and stable  Post vital signs: Reviewed and stable  Last Vitals: 128/72, 80, 13, 97%, 98.4  Vitals:   06/02/16 0613 06/02/16 0842  BP: 132/74 128/72  Pulse: 79   Resp: 16   Temp: 37.2 C 36.9 C    Last Pain:  Vitals:   06/02/16 0842  TempSrc:   PainSc: 0-No pain      Patients Stated Pain Goal: 7 ([id number])  Complications: no complications

## 2016-06-10 ENCOUNTER — Encounter (HOSPITAL_BASED_OUTPATIENT_CLINIC_OR_DEPARTMENT_OTHER): Payer: Self-pay | Admitting: Obstetrics and Gynecology

## 2016-08-28 ENCOUNTER — Other Ambulatory Visit: Payer: Self-pay

## 2016-10-26 HISTORY — PX: ROTATOR CUFF REPAIR: SHX139

## 2016-11-23 ENCOUNTER — Ambulatory Visit: Payer: BC Managed Care – PPO | Attending: Orthopedic Surgery | Admitting: Physical Therapy

## 2016-11-23 DIAGNOSIS — M25611 Stiffness of right shoulder, not elsewhere classified: Secondary | ICD-10-CM | POA: Diagnosis present

## 2016-11-23 DIAGNOSIS — M25511 Pain in right shoulder: Secondary | ICD-10-CM | POA: Insufficient documentation

## 2016-11-23 NOTE — Therapy (Signed)
[facility] [place], [place], [address] Phone: [phone]   Fax:  [phone]  Physical Therapy Evaluation  Patient Details  Name: Carol Cox MRN: [id number] Date of Birth: 1951/01/11 Referring Provider: Esmond Plants MD.  Encounter Date: 11/23/2016      PT End of Session - 11/23/16 1050    Visit Number 1   Number of Visits 12   Date for PT Re-Evaluation 01/04/17   PT Start Time 0956   PT Stop Time 1044   PT Time Calculation (min) 48 min   Activity Tolerance Patient tolerated treatment well   Behavior During Therapy [facility] for tasks assessed/performed      Past Medical History:  Diagnosis Date  . Arthritis    "knee, left hip, ankles"   . Borderline glaucoma   . CAD (coronary artery disease)    Mild nonobstructive CAD by cath 02/06/13 (25% mid LAD), normal EF  . Family history of adverse reaction to anesthesia    mother has problems with nausea and vomiting   . History of colitis    02/ 2016  acute infectious colitis-- resolved  . Hyperlipidemia   . Left ovarian cyst   . PONV (postoperative nausea and vomiting)    SEVERE    Past Surgical History:  Procedure Laterality Date  . CARPAL TUNNEL RELEASE Bilateral 1990's  . KNEE ARTHROSCOPY W/ MENISCECTOMY Left 07/2014  . LEFT HEART CATHETERIZATION WITH CORONARY ANGIOGRAM N/A 02/06/2013   Procedure: LEFT HEART CATHETERIZATION WITH CORONARY ANGIOGRAM;  Surgeon: [name] Hampshire, MD;  Location: [facility];  Service: Cardiovascular;  Laterality: N/A;  non-obstruction 25% mLAD,  normal LVF , ef 65-70%  . left knee meniscus surgery     . TOTAL KNEE ARTHROPLASTY Left 03/26/2015   Procedure: LEFT TOTAL KNEE ARTHROPLASTY;  Surgeon: Paralee Cancel, MD;  Location: [facility];  Service: Orthopedics;  Laterality: Left;    There were no vitals filed for this visit.       Subjective Assessment - 11/23/16 1030    Subjective The patient presents to OPPT s/p right RTC repair  surgery performed on 11/16/16.  She is in a sling and has been compliant to several active-assistive exercises prescribed by her surgeon.  Her resting pain-level is a 4/10 today.  Movement increases her pain and rest and ice decrease her pain.   Patient Stated Goals Use right UE without pain.   Currently in Pain? Yes   Pain Score 4    Pain Location Shoulder   Pain Orientation Right   Pain Descriptors / Indicators Aching;Throbbing   Pain Type Acute pain   Pain Onset In the past 7 days   Pain Frequency Constant   Aggravating Factors  See above.   Pain Relieving Factors See above.            [facility] PT Assessment - 11/23/16 0001      Assessment   Medical Diagnosis Right rotator cuff repair.   Referring Provider Esmond Plants MD.   Onset Date/Surgical Date --  11/16/16 (surgery date).     Precautions   Precautions --  PAROM/AAROM to patients right shoulder. No abduction.     Restrictions   Weight Bearing Restrictions No     Balance Screen   Has the patient fallen in the past 6 months No   Has the patient had a decrease in activity level because of a fear of falling?  No   Is the patient reluctant to leave their home  because of a fear of falling?  No     Home Environment   Living Environment Private residence     Prior Function   Level of Independence Independent     Observation/Other Assessments   Observations Steri-strips intact over right shoulder incisional sites.     Posture/Postural Control   Posture Comments Guarded.     ROM / Strength   AROM / PROM / Strength AROM;Strength     AROM   Overall AROM Comments In supine:  PAROM in supine with right shoulder flexion to 90 degrees, ER= 14 degrees and ER to abdomen.     Strength   Overall Strength Comments NT.     Palpation   Palpation comment C/o diffuse right shoulder pain.     Ambulation/Gait   Gait Comments WNL....patient has right shoulder sling donned.                   [facility] PT  Treatment/Exercise - 11/23/16 0001      Modalities   Modalities Vasopneumatic     Vasopneumatic   Number Minutes Vasopneumatic  15 minutes   Vasopnuematic Location  --  Right shoulder.   Vasopneumatic Pressure Low     Manual Therapy   Manual Therapy Passive ROM   Passive ROM In supine PAROM to patient's right shoulder x 10 minutes.                     PT Long Term Goals - 11/23/16 1054      PT LONG TERM GOAL #1   Title Ind with HEP.   Time 6   Period Weeks   Status New     PT LONG TERM GOAL #2   Title Active right shoulder flexion to 150 degrees so the patient can easily reach overhead.   Time 6   Period Weeks   Status New     PT LONG TERM GOAL #3   Title Active ER to 70 degrees+ to allow for easily donning/doffing of apparel   Time 6   Period Weeks   Status New     PT LONG TERM GOAL #4   Title Increase ROM so patient is able to reach behind back to L3.   Time 6   Period Weeks   Status New     PT LONG TERM GOAL #5   Title Increase right shoulder strength to a solid 4+/5 to increase stability for performance of functional activities   Time 6   Period Weeks   Status New     PT LONG TERM GOAL #6   Title Perform ADL's with pain not > 3/10.   Time 6   Status New               Plan - 11/23/16 1052    Clinical Impression Statement The patient is doing very well after a right shoulder RTC repair performed on 1//2/18.  She has a loss of right shoulder ROM as expected but is vert well educated in her home exercise.  Her limitations currently prohibit her from performing ADL's with her right UE.  She remains in a sling at this time.   Rehab Potential Excellent   PT Frequency 2x / week   PT Duration 6 weeks   PT Treatment/Interventions ADLs/Self Care Home Management;Cryotherapy;Electrical Stimulation;Patient/family education;Therapeutic exercise;Therapeutic activities;Manual techniques;Passive range of motion;Vasopneumatic Device   PT Next Visit  Plan In supine:  PAROM/AAROM to patient's right shoulder.  Vasopneumatic.  Patient will benefit from skilled therapeutic intervention in order to improve the following deficits and impairments:  Pain, Decreased activity tolerance, Decreased range of motion  Visit Diagnosis: Acute pain of right shoulder - Plan: PT plan of care cert/re-cert  Stiffness of right shoulder joint - Plan: PT plan of care cert/re-cert     Problem List Patient Active Problem List   Diagnosis Date Noted  . Obese 03/27/2015  . S/P left TKA 03/26/2015  . S/P knee replacement 03/26/2015  . Colitis, acute 12/01/2014  . Weight loss, unintentional 12/01/2014  . Adnexal cyst 12/01/2014  . Elevated fasting glucose 12/01/2014  . CAD (coronary artery disease) 12/01/2014  . Osteoarthritis 12/01/2014  . Vasovagal attack 02/28/2013  . Hyperlipidemia 02/28/2013    Carol Cox, Carol Cox 11/23/2016, 11:00 AM  [facility] [place], [place], [address] Phone: [phone]   Fax:  [phone]  Name: Carol Cox MRN: [id number] Date of Birth: December 27, 1950

## 2016-11-25 ENCOUNTER — Encounter: Payer: Self-pay | Admitting: Physical Therapy

## 2016-11-25 ENCOUNTER — Ambulatory Visit: Payer: BC Managed Care – PPO | Admitting: Physical Therapy

## 2016-11-25 DIAGNOSIS — M25511 Pain in right shoulder: Secondary | ICD-10-CM

## 2016-11-25 DIAGNOSIS — M25611 Stiffness of right shoulder, not elsewhere classified: Secondary | ICD-10-CM

## 2016-11-25 NOTE — Therapy (Signed)
[facility] [place], [place], [address] Phone: [phone]   Fax:  [phone]  Physical Therapy Treatment  Patient Details  Name: Carol Cox MRN: [id number] Date of Birth: 01-Nov-1950 Referring Provider: Esmond Plants MD.  Encounter Date: 11/25/2016      PT End of Session - 11/25/16 1411    Visit Number 2   Number of Visits 12   Date for PT Re-Evaluation 01/04/17   PT Start Time 1310   PT Stop Time 1400   PT Time Calculation (min) 50 min   Activity Tolerance Patient tolerated treatment well   Behavior During Therapy [facility] for tasks assessed/performed      Past Medical History:  Diagnosis Date  . Arthritis    "knee, left hip, ankles"   . Borderline glaucoma   . CAD (coronary artery disease)    Mild nonobstructive CAD by cath 02/06/13 (25% mid LAD), normal EF  . Family history of adverse reaction to anesthesia    mother has problems with nausea and vomiting   . History of colitis    02/ 2016  acute infectious colitis-- resolved  . Hyperlipidemia   . Left ovarian cyst   . PONV (postoperative nausea and vomiting)    SEVERE    Past Surgical History:  Procedure Laterality Date  . CARPAL TUNNEL RELEASE Bilateral 1990's  . KNEE ARTHROSCOPY W/ MENISCECTOMY Left 07/2014  . LEFT HEART CATHETERIZATION WITH CORONARY ANGIOGRAM N/A 02/06/2013   Procedure: LEFT HEART CATHETERIZATION WITH CORONARY ANGIOGRAM;  Surgeon: [name] Hampshire, MD;  Location: [place] [facility];  Service: Cardiovascular;  Laterality: N/A;  non-obstruction 25% mLAD,  normal LVF , ef 65-70%  . left knee meniscus surgery     . TOTAL KNEE ARTHROPLASTY Left 03/26/2015   Procedure: LEFT TOTAL KNEE ARTHROPLASTY;  Surgeon: Paralee Cancel, MD;  Location: [facility];  Service: Orthopedics;  Laterality: Left;    There were no vitals filed for this visit.      Subjective Assessment - 11/25/16 1310    Subjective The patient presents to OPPT s/p right RTC repair surgery  performed on 11/16/16.  She is in a sling and has been compliant to several active-assistive exercises prescribed by her surgeon.  Her resting pain-level is a 2/10 today. Pt reporting difficutly with ADL's (fixing her hair).       Patient Stated Goals Use right UE without pain.   Currently in Pain? Yes   Pain Score 2    Pain Location Shoulder   Pain Orientation Right   Pain Descriptors / Indicators Aching;Throbbing   Pain Type Acute pain;Surgical pain   Pain Onset 1 to 4 weeks ago   Pain Frequency Constant   Aggravating Factors  movements   Pain Relieving Factors ice, pain meds, resting in sling   Multiple Pain Sites No            [facility] PT Assessment - 11/25/16 0001      ROM / Strength   AROM / PROM / Strength AROM     AROM   AROM Assessment Site Shoulder   Right/Left Shoulder Right   Right Shoulder Flexion 90 Degrees   Right Shoulder ABduction 70 Degrees   Right Shoulder Internal Rotation --  to abdomen   Right Shoulder External Rotation 50 Degrees     Ambulation/Gait   Gait Comments WNL....patient has right shoulder sling donned when amb in/out of clinic today.  [facility] Adult PT Treatment/Exercise - 11/25/16 0001      Posture/Postural Control   Posture Comments guarded     Exercises   Exercises Shoulder     Shoulder Exercises: Seated   Other Seated Exercises shoulder depression x 10   Other Seated Exercises cervical retraction x 5 holding 5 seconds     Shoulder Exercises: Standing   Other Standing Exercises pendulums: counter clockwise, clockwise, forward/back, side to side.      Modalities   Modalities Vasopneumatic     Vasopneumatic   Number Minutes Vasopneumatic  15 minutes   Vasopneumatic Pressure Low   Vasopneumatic Temperature  3 snowflakes     Manual Therapy   Manual Therapy Passive ROM   Manual therapy comments trigger point release to R upper trap   Passive ROM In supine PAROM to patient's right shoulder x 10  minutes.                PT Education - 11/25/16 1411    Education provided Yes   Education Details HEP, discussed sleeping positions supported by pillows   Person(s) Educated Patient   Methods Demonstration;Explanation;Verbal cues;Handout   Comprehension Verbalized understanding;Returned demonstration             PT Long Term Goals - 11/25/16 1416      PT LONG TERM GOAL #1   Title Ind with HEP.   Time 6   Period Weeks   Status On-going     PT LONG TERM GOAL #2   Title Active right shoulder flexion to 150 degrees so the patient can easily reach overhead.   Time 6   Period Weeks   Status New     PT LONG TERM GOAL #3   Title Active ER to 70 degrees+ to allow for easily donning/doffing of apparel   Time 6   Period Weeks     PT LONG TERM GOAL #4   Title Increase ROM so patient is able to reach behind back to L3.   Time 6   Period Weeks   Status New     PT LONG TERM GOAL #5   Title Increase right shoulder strength to a solid 4+/5 to increase stability for performance of functional activities   Time 6   Status New     PT LONG TERM GOAL #6   Title Perform ADL's with pain not > 3/10.   Time 6   Period Weeks   Status New               Plan - 11/25/16 1412    Clinical Impression Statement Pt arriving to therapy today reporting 2/10 R shoulder pain following a RTC repair on 10/27/16. Pt reported doing all her HEP, new cervical retraction and shoulder depression added. Pt reporting no pain at end of treatment.  ROM measurements taken during treatment. Pt tolerated AAROM during session in all planes. Skilled PT needed to continue to progress pt toward her goals set and PLOF.    Rehab Potential Excellent   PT Frequency 2x / week   PT Duration 6 weeks   PT Next Visit Plan In supine:  PAROM/AAROM to patient's right shoulder.  Vasopneumatic   PT Home Exercise Plan added cervical retraction and shoulder depression to help with neck pain   Consulted and Agree  with Plan of Care Patient      Patient will benefit from skilled therapeutic intervention in order to improve the following deficits and impairments:  Pain, Decreased activity  tolerance, Decreased range of motion  Visit Diagnosis: Acute pain of right shoulder  Stiffness of right shoulder joint     Problem List Patient Active Problem List   Diagnosis Date Noted  . Obese 03/27/2015  . S/P left TKA 03/26/2015  . S/P knee replacement 03/26/2015  . Colitis, acute 12/01/2014  . Weight loss, unintentional 12/01/2014  . Adnexal cyst 12/01/2014  . Elevated fasting glucose 12/01/2014  . CAD (coronary artery disease) 12/01/2014  . Osteoarthritis 12/01/2014  . Vasovagal attack 02/28/2013  . Hyperlipidemia 02/28/2013    Oretha Caprice, MPT 11/25/2016, 2:19 PM  [facility] [place], [place], [address] Phone: [phone]   Fax:  [phone]  Name: Carol Cox MRN: [id number] Date of Birth: Nov 10, 1950

## 2016-12-01 ENCOUNTER — Ambulatory Visit: Payer: BC Managed Care – PPO | Attending: Orthopedic Surgery | Admitting: *Deleted

## 2016-12-01 DIAGNOSIS — M25611 Stiffness of right shoulder, not elsewhere classified: Secondary | ICD-10-CM | POA: Insufficient documentation

## 2016-12-01 DIAGNOSIS — M25511 Pain in right shoulder: Secondary | ICD-10-CM | POA: Diagnosis not present

## 2016-12-01 NOTE — Therapy (Signed)
[facility] [place], [place], [address] Phone: [phone]   Fax:  [phone]  Physical Therapy Treatment  Patient Details  Name: Carol Cox CURRENT MRN: [id number] Date of Birth: 01/11/1951 Referring Provider: Esmond Plants MD.  Encounter Date: 12/01/2016      PT End of Session - 12/01/16 1028    Visit Number 3   Number of Visits 12   Date for PT Re-Evaluation 01/04/17   PT Start Time 0945   PT Stop Time [id number]   PT Time Calculation (min) 50 min      Past Medical History:  Diagnosis Date  . Arthritis    "knee, left hip, ankles"   . Borderline glaucoma   . CAD (coronary artery disease)    Mild nonobstructive CAD by cath 02/06/13 (25% mid LAD), normal EF  . Family history of adverse reaction to anesthesia    mother has problems with nausea and vomiting   . History of colitis    02/ 2016  acute infectious colitis-- resolved  . Hyperlipidemia   . Left ovarian cyst   . PONV (postoperative nausea and vomiting)    SEVERE    Past Surgical History:  Procedure Laterality Date  . CARPAL TUNNEL RELEASE Bilateral 1990's  . KNEE ARTHROSCOPY W/ MENISCECTOMY Left 07/2014  . LEFT HEART CATHETERIZATION WITH CORONARY ANGIOGRAM N/A 02/06/2013   Procedure: LEFT HEART CATHETERIZATION WITH CORONARY ANGIOGRAM;  Surgeon: [name] Hampshire, MD;  Location: [place] [facility];  Service: Cardiovascular;  Laterality: N/A;  non-obstruction 25% mLAD,  normal LVF , ef 65-70%  . left knee meniscus surgery     . TOTAL KNEE ARTHROPLASTY Left 03/26/2015   Procedure: LEFT TOTAL KNEE ARTHROPLASTY;  Surgeon: Paralee Cancel, MD;  Location: [facility];  Service: Orthopedics;  Laterality: Left;    There were no vitals filed for this visit.      Subjective Assessment - 12/01/16 0950    Subjective The patient presents to OPPT s/p right RTC repair surgery performed on 11/16/16.  She is in a sling and has been compliant to several active-assistive exercises prescribed by  her surgeon.  Her resting pain-level is a 2/10 today. Pt reporting difficutly with ADL's (fixing her hair).       Patient Stated Goals Use right UE without pain.   Currently in Pain? Yes   Pain Score 2    Pain Location Shoulder                         [facility] Adult PT Treatment/Exercise - 12/01/16 0001      Exercises   Exercises Shoulder     Modalities   Modalities Vasopneumatic     Vasopneumatic   Number Minutes Vasopneumatic  15 minutes   Vasopnuematic Location  Knee   Vasopneumatic Pressure Low   Vasopneumatic Temperature  3 snowflakes     Manual Therapy   Manual Therapy Passive ROM   Passive ROM In supine PAROM to patient's right shoulder flexion to 120 degrees,, ABD to 60, and ER to 40     supine AAROM for flexion clasped hands                PT Long Term Goals - 11/25/16 1416      PT LONG TERM GOAL #1   Title Ind with HEP.   Time 6   Period Weeks   Status On-going     PT LONG TERM GOAL #2   Title  Active right shoulder flexion to 150 degrees so the patient can easily reach overhead.   Time 6   Period Weeks   Status New     PT LONG TERM GOAL #3   Title Active ER to 70 degrees+ to allow for easily donning/doffing of apparel   Time 6   Period Weeks     PT LONG TERM GOAL #4   Title Increase ROM so patient is able to reach behind back to L3.   Time 6   Period Weeks   Status New     PT LONG TERM GOAL #5   Title Increase right shoulder strength to a solid 4+/5 to increase stability for performance of functional activities   Time 6   Status New     PT LONG TERM GOAL #6   Title Perform ADL's with pain not > 3/10.   Time 6   Period Weeks   Status New               Plan - 12/01/16 1030    Clinical Impression Statement Pt did fairly  well today and was able to meet flexion of 120 degrees, 40 degrees ER and abduction of 60 degrees.   Rehab Potential Excellent   PT Frequency 2x / week   PT Duration 6 weeks   PT  Treatment/Interventions ADLs/Self Care Home Management;Cryotherapy;Electrical Stimulation;Patient/family education;Therapeutic exercise;Therapeutic activities;Manual techniques;Passive range of motion;Vasopneumatic Device   PT Next Visit Plan In supine:  PAROM/AAROM to patient's right shoulder.  Vasopneumatic   PT Home Exercise Plan added cervical retraction and shoulder depression to help with neck pain   Consulted and Agree with Plan of Care Patient      Patient will benefit from skilled therapeutic intervention in order to improve the following deficits and impairments:  Pain, Decreased activity tolerance, Decreased range of motion  Visit Diagnosis: Acute pain of right shoulder  Stiffness of right shoulder joint     Problem List Patient Active Problem List   Diagnosis Date Noted  . Obese 03/27/2015  . S/P left TKA 03/26/2015  . S/P knee replacement 03/26/2015  . Colitis, acute 12/01/2014  . Weight loss, unintentional 12/01/2014  . Adnexal cyst 12/01/2014  . Elevated fasting glucose 12/01/2014  . CAD (coronary artery disease) 12/01/2014  . Osteoarthritis 12/01/2014  . Vasovagal attack 02/28/2013  . Hyperlipidemia 02/28/2013    Tyree Vandruff,CHRIS, PTA 12/01/2016, 10:57 AM  [facility] [address] [place], [place], [address] Phone: [phone]   Fax:  [phone]  Name: CLAIRISSA TORNER MRN: [id number] Date of Birth: Aug 19, 1951

## 2016-12-03 ENCOUNTER — Ambulatory Visit: Payer: BC Managed Care – PPO | Admitting: *Deleted

## 2016-12-03 DIAGNOSIS — M25611 Stiffness of right shoulder, not elsewhere classified: Secondary | ICD-10-CM

## 2016-12-03 DIAGNOSIS — M25511 Pain in right shoulder: Secondary | ICD-10-CM | POA: Diagnosis not present

## 2016-12-03 NOTE — Therapy (Signed)
[facility] [place], [place], [address] Phone: [phone]   Fax:  [phone]  Physical Therapy Treatment  Patient Details  Name: Carol Cox MRN: [id number] Date of Birth: [birth year] Referring Provider: Esmond Plants MD.  Encounter Date: 12/03/2016      PT End of Session - 12/03/16 1008    Visit Number 4   Number of Visits 12   Date for PT Re-Evaluation 01/04/17   PT Start Time 0950   PT Stop Time 1038   PT Time Calculation (min) 48 min      Past Medical History:  Diagnosis Date  . Arthritis    "knee, left hip, ankles"   . Borderline glaucoma   . CAD (coronary artery disease)    Mild nonobstructive CAD by cath 02/06/13 (25% mid LAD), normal EF  . Family history of adverse reaction to anesthesia    mother has problems with nausea and vomiting   . History of colitis    02/ 2016  acute infectious colitis-- resolved  . Hyperlipidemia   . Left ovarian cyst   . PONV (postoperative nausea and vomiting)    SEVERE    Past Surgical History:  Procedure Laterality Date  . CARPAL TUNNEL RELEASE Bilateral 1990's  . KNEE ARTHROSCOPY W/ MENISCECTOMY Left 07/2014  . LEFT HEART CATHETERIZATION WITH CORONARY ANGIOGRAM N/A 02/06/2013   Procedure: LEFT HEART CATHETERIZATION WITH CORONARY ANGIOGRAM;  Surgeon: [name] Hampshire, MD;  Location: [place] [facility];  Service: Cardiovascular;  Laterality: N/A;  non-obstruction 25% mLAD,  normal LVF , ef 65-70%  . left knee meniscus surgery     . TOTAL KNEE ARTHROPLASTY Left 03/26/2015   Procedure: LEFT TOTAL KNEE ARTHROPLASTY;  Surgeon: Paralee Cancel, MD;  Location: [facility];  Service: Orthopedics;  Laterality: Left;    There were no vitals filed for this visit.                       [facility] Adult PT Treatment/Exercise - 12/03/16 0001      Exercises   Exercises Shoulder     Modalities   Modalities Electrical Stimulation;Cryotherapy     Cryotherapy   Number Minutes  Cryotherapy 15 Minutes   Cryotherapy Location Shoulder   Type of Cryotherapy Ice pack     Electrical Stimulation   Electrical Stimulation Location Rt shldr premod x 15 mins 80-150hz  supine   Electrical Stimulation Goals Pain     Manual Therapy   Manual Therapy Passive ROM   Passive ROM In supine PAROM to patient's right shoulder flexion to 120 degrees,, ABD to 60, and ER to 40                     PT Long Term Goals - 11/25/16 1416      PT LONG TERM GOAL #1   Title Ind with HEP.   Time 6   Period Weeks   Status On-going     PT LONG TERM GOAL #2   Title Active right shoulder flexion to 150 degrees so the patient can easily reach overhead.   Time 6   Period Weeks   Status New     PT LONG TERM GOAL #3   Title Active ER to 70 degrees+ to allow for easily donning/doffing of apparel   Time 6   Period Weeks     PT LONG TERM GOAL #4   Title Increase ROM so patient is able to reach behind back  to L3.   Time 6   Period Weeks   Status New     PT LONG TERM GOAL #5   Title Increase right shoulder strength to a solid 4+/5 to increase stability for performance of functional activities   Time 6   Status New     PT LONG TERM GOAL #6   Title Perform ADL's with pain not > 3/10.   Time 6   Period Weeks   Status New               Plan - 12/03/16 1054    Clinical Impression Statement Pt reports being a little sore after last Rx, but is doing better today. She continues to be within protocol ranges and doing well. Flexion to 125, ER 40, ABD to 60.   Rehab Potential Excellent   PT Frequency 2x / week   PT Duration 6 weeks   PT Treatment/Interventions ADLs/Self Care Home Management;Cryotherapy;Electrical Stimulation;Patient/family education;Therapeutic exercise;Therapeutic activities;Manual techniques;Passive range of motion;Vasopneumatic Device   PT Next Visit Plan In supine:  PAROM/AAROM to patient's right shoulder.  Vasopneumatic   PT Home Exercise Plan added  cervical retraction and shoulder depression to help with neck pain   Consulted and Agree with Plan of Care Patient      Patient will benefit from skilled therapeutic intervention in order to improve the following deficits and impairments:  Pain, Decreased activity tolerance, Decreased range of motion  Visit Diagnosis: Acute pain of right shoulder  Stiffness of right shoulder joint     Problem List Patient Active Problem List   Diagnosis Date Noted  . Obese 03/27/2015  . S/P left TKA 03/26/2015  . S/P knee replacement 03/26/2015  . Colitis, acute 12/01/2014  . Weight loss, unintentional 12/01/2014  . Adnexal cyst 12/01/2014  . Elevated fasting glucose 12/01/2014  . CAD (coronary artery disease) 12/01/2014  . Osteoarthritis 12/01/2014  . Vasovagal attack 02/28/2013  . Hyperlipidemia 02/28/2013    Lyssa Hackley,CHRIS, PTA 12/03/2016, 1:12 PM  [facility] [address] [place], [place], [address] Phone: [phone]   Fax:  [phone]  Name: Carol Cox MRN: [id number] Date of Birth: [birth year]

## 2016-12-08 ENCOUNTER — Ambulatory Visit: Payer: BC Managed Care – PPO | Admitting: *Deleted

## 2016-12-08 DIAGNOSIS — M25511 Pain in right shoulder: Secondary | ICD-10-CM

## 2016-12-08 DIAGNOSIS — M25611 Stiffness of right shoulder, not elsewhere classified: Secondary | ICD-10-CM

## 2016-12-08 NOTE — Therapy (Signed)
[facility] [place], [place], [address] Phone: [phone]   Fax:  [phone]  Physical Therapy Treatment  Patient Details  Name: Carol Cox MRN: [id number] Date of Birth: 20-Dec-1950 Referring Provider: Esmond Plants MD.  Encounter Date: 12/08/2016      PT End of Session - 12/08/16 0952    Visit Number 5   Number of Visits 12   Date for PT Re-Evaluation 01/04/17   PT Start Time 0951   PT Stop Time 1042   PT Time Calculation (min) 51 min      Past Medical History:  Diagnosis Date  . Arthritis    "knee, left hip, ankles"   . Borderline glaucoma   . CAD (coronary artery disease)    Mild nonobstructive CAD by cath 02/06/13 (25% mid LAD), normal EF  . Family history of adverse reaction to anesthesia    mother has problems with nausea and vomiting   . History of colitis    02/ 2016  acute infectious colitis-- resolved  . Hyperlipidemia   . Left ovarian cyst   . PONV (postoperative nausea and vomiting)    SEVERE    Past Surgical History:  Procedure Laterality Date  . CARPAL TUNNEL RELEASE Bilateral 1990's  . KNEE ARTHROSCOPY W/ MENISCECTOMY Left 07/2014  . LEFT HEART CATHETERIZATION WITH CORONARY ANGIOGRAM N/A 02/06/2013   Procedure: LEFT HEART CATHETERIZATION WITH CORONARY ANGIOGRAM;  Surgeon: [name] Hampshire, MD;  Location: [facility];  Service: Cardiovascular;  Laterality: N/A;  non-obstruction 25% mLAD,  normal LVF , ef 65-70%  . left knee meniscus surgery     . TOTAL KNEE ARTHROPLASTY Left 03/26/2015   Procedure: LEFT TOTAL KNEE ARTHROPLASTY;  Surgeon: Paralee Cancel, MD;  [facility]: [facility];  Service: Orthopedics;  Laterality: Left;    There were no vitals filed for this visit.      Subjective Assessment - 12/08/16 0953    Subjective RT shldr has been hurting more 4-6/ 10   Patient Stated Goals Use right UE without pain.   Currently in Pain? Yes   Pain Score 4    Pain Orientation Right   Pain  Descriptors / Indicators Aching;Throbbing   Pain Type Acute pain;Surgical pain   Pain Onset 1 to 4 weeks ago                         [facility] Adult PT Treatment/Exercise - 12/08/16 0001      Modalities   Modalities Electrical Stimulation;Cryotherapy     Acupuncturist Stimulation Location Rt shldr premod x 15 mins 80-150hz  supine   Electrical Stimulation Goals Pain     Vasopneumatic   Number Minutes Vasopneumatic  15 minutes   Vasopnuematic Location  Shoulder   Vasopneumatic Pressure Medium   Vasopneumatic Temperature  3 snowflakes     Manual Therapy   Manual Therapy Passive ROM   Passive ROM In supine PAROM to patient's right shoulder flexion to 145 degrees,, ABD to 60, and ER to 45                     PT Long Term Goals - 11/25/16 1416      PT LONG TERM GOAL #1   Title Ind with HEP.   Time 6   Period Weeks   Status On-going     PT LONG TERM GOAL #2   Title Active right shoulder flexion to 150 degrees so  the patient can easily reach overhead.   Time 6   Period Weeks   Status New     PT LONG TERM GOAL #3   Title Active ER to 70 degrees+ to allow for easily donning/doffing of apparel   Time 6   Period Weeks     PT LONG TERM GOAL #4   Title Increase ROM so patient is able to reach behind back to L3.   Time 6   Period Weeks   Status New     PT LONG TERM GOAL #5   Title Increase right shoulder strength to a solid 4+/5 to increase stability for performance of functional activities   Time 6   Status New     PT LONG TERM GOAL #6   Title Perform ADL's with pain not > 3/10.   Time 6   Period Weeks   Status New               Plan - 12/08/16 1003    Clinical Impression Statement Pt reports having increased pain over the weekend 4-6/10. We decreased her reps on supine AAROM for flexion at this time for HEP. She did do well with Rx today though and was able to reach PROM for flexion to 140 degrees, ER to 45 and  Abd to 60 . Normal response to Vaso   Rehab Potential Excellent   PT Frequency 2x / week   PT Duration 6 weeks   PT Treatment/Interventions ADLs/Self Care Home Management;Cryotherapy;Electrical Stimulation;Patient/family education;Therapeutic exercise;Therapeutic activities;Manual techniques;Passive range of motion;Vasopneumatic Device   PT Next Visit Plan In supine:  PAROM/AAROM to patient's right shoulder.  Vasopneumatic   PT Home Exercise Plan added cervical retraction and shoulder depression to help with neck pain   Consulted and Agree with Plan of Care Patient      Patient will benefit from skilled therapeutic intervention in order to improve the following deficits and impairments:  Pain, Decreased activity tolerance, Decreased range of motion  Visit Diagnosis: Acute pain of right shoulder  Stiffness of right shoulder joint     Problem List Patient Active Problem List   Diagnosis Date Noted  . Obese 03/27/2015  . S/P left TKA 03/26/2015  . S/P knee replacement 03/26/2015  . Colitis, acute 12/01/2014  . Weight loss, unintentional 12/01/2014  . Adnexal cyst 12/01/2014  . Elevated fasting glucose 12/01/2014  . CAD (coronary artery disease) 12/01/2014  . Osteoarthritis 12/01/2014  . Vasovagal attack 02/28/2013  . Hyperlipidemia 02/28/2013    RAMSEUR,CHRIS, PTA 12/08/2016, 10:40 AM  [facility] [place], [place], [address] Phone: [phone]   Fax:  [phone]  Name: Carol Cox MRN: [id number] Date of Birth: [birth year]

## 2016-12-10 ENCOUNTER — Ambulatory Visit: Payer: BC Managed Care – PPO | Admitting: *Deleted

## 2016-12-10 DIAGNOSIS — M25611 Stiffness of right shoulder, not elsewhere classified: Secondary | ICD-10-CM

## 2016-12-10 DIAGNOSIS — M25511 Pain in right shoulder: Secondary | ICD-10-CM | POA: Diagnosis not present

## 2016-12-10 NOTE — Therapy (Signed)
[facility] [place], [place], [address] Phone: [phone]   Fax:  [phone]  Physical Therapy Treatment  Patient Details  Name: Carol Cox MRN: [id number] Date of Birth: 05/25/1951 Referring Provider: Esmond Plants MD.  Encounter Date: 12/10/2016      PT End of Session - 12/10/16 0958    Visit Number 6   Number of Visits 12   Date for PT Re-Evaluation 01/04/17   PT Start Time 0945   PT Stop Time 1036   PT Time Calculation (min) 51 min   Activity Tolerance Patient tolerated treatment well   Behavior During Therapy [facility] for tasks assessed/performed      Past Medical History:  Diagnosis Date  . Arthritis    "knee, left hip, ankles"   . Borderline glaucoma   . CAD (coronary artery disease)    Mild nonobstructive CAD by cath 02/06/13 (25% mid LAD), normal EF  . Family history of adverse reaction to anesthesia    mother has problems with nausea and vomiting   . History of colitis    02/ 2016  acute infectious colitis-- resolved  . Hyperlipidemia   . Left ovarian cyst   . PONV (postoperative nausea and vomiting)    SEVERE    Past Surgical History:  Procedure Laterality Date  . CARPAL TUNNEL RELEASE Bilateral 1990's  . KNEE ARTHROSCOPY W/ MENISCECTOMY Left 07/2014  . LEFT HEART CATHETERIZATION WITH CORONARY ANGIOGRAM N/A 02/06/2013   Procedure: LEFT HEART CATHETERIZATION WITH CORONARY ANGIOGRAM;  Surgeon: [name] Hampshire, MD;  Location: [place] [facility];  Service: Cardiovascular;  Laterality: N/A;  non-obstruction 25% mLAD,  normal LVF , ef 65-70%  . left knee meniscus surgery     . TOTAL KNEE ARTHROPLASTY Left 03/26/2015   Procedure: LEFT TOTAL KNEE ARTHROPLASTY;  Surgeon: Paralee Cancel, MD;  Location: [facility];  Service: Orthopedics;  Laterality: Left;    There were no vitals filed for this visit.      Subjective Assessment - 12/10/16 0956    Subjective RT shldr has been hurting less 2/ 10. Doing better now   Patient Stated Goals Use right UE without pain.   Currently in Pain? Yes   Pain Score 4    Pain Location Shoulder   Pain Onset 1 to 4 weeks ago                         [facility] Adult PT Treatment/Exercise - 12/10/16 0001      Exercises   Exercises Shoulder     Modalities   Modalities Electrical Stimulation;Cryotherapy     Vasopneumatic   Number Minutes Vasopneumatic  15 minutes   Vasopnuematic Location  Shoulder   Vasopneumatic Pressure Medium   Vasopneumatic Temperature  3 snowflakes     Manual Therapy   Manual Therapy Passive ROM   Manual therapy comments trigger point release to R upper trap   Passive ROM In supine PAROM to patient's right shoulder flexion to 145 degrees,, ABD to 60, and ER to 45                     PT Long Term Goals - 11/25/16 1416      PT LONG TERM GOAL #1   Title Ind with HEP.   Time 6   Period Weeks   Status On-going     PT LONG TERM GOAL #2   Title Active right shoulder flexion to 150 degrees  so the patient can easily reach overhead.   Time 6   Period Weeks   Status New     PT LONG TERM GOAL #3   Title Active ER to 70 degrees+ to allow for easily donning/doffing of apparel   Time 6   Period Weeks     PT LONG TERM GOAL #4   Title Increase ROM so patient is able to reach behind back to L3.   Time 6   Period Weeks   Status New     PT LONG TERM GOAL #5   Title Increase right shoulder strength to a solid 4+/5 to increase stability for performance of functional activities   Time 6   Status New     PT LONG TERM GOAL #6   Title Perform ADL's with pain not > 3/10.   Time 6   Period Weeks   Status New               Plan - 12/10/16 1035    Clinical Impression Statement Pt was doing much better today with less pain in RT shldr. She was able to reach flexion to 145 degrees, ER45, and Abd to 60 degree.LTGs ongoing due to limits.   Rehab Potential Excellent   PT Frequency 2x / week   PT Duration 6 weeks    PT Treatment/Interventions ADLs/Self Care Home Management;Cryotherapy;Electrical Stimulation;Patient/family education;Therapeutic exercise;Therapeutic activities;Manual techniques;Passive range of motion;Vasopneumatic Device   PT Next Visit Plan In supine:  PAROM/AAROM to patient's right shoulder.  Vasopneumatic   PT Home Exercise Plan added cervical retraction and shoulder depression to help with neck pain   Consulted and Agree with Plan of Care Patient      Patient will benefit from skilled therapeutic intervention in order to improve the following deficits and impairments:  Pain, Decreased activity tolerance, Decreased range of motion  Visit Diagnosis: Acute pain of right shoulder  Stiffness of right shoulder joint     Problem List Patient Active Problem List   Diagnosis Date Noted  . Obese 03/27/2015  . S/P left TKA 03/26/2015  . S/P knee replacement 03/26/2015  . Colitis, acute 12/01/2014  . Weight loss, unintentional 12/01/2014  . Adnexal cyst 12/01/2014  . Elevated fasting glucose 12/01/2014  . CAD (coronary artery disease) 12/01/2014  . Osteoarthritis 12/01/2014  . Vasovagal attack 02/28/2013  . Hyperlipidemia 02/28/2013    Nejla Reasor,CHRIS, PTA 12/10/2016, 10:44 AM  [facility] [place], [place], [address] Phone: [phone]   Fax:  [phone]  Name: Carol Cox MRN: [id number] Date of Birth: 1951/10/21

## 2016-12-15 ENCOUNTER — Ambulatory Visit: Payer: BC Managed Care – PPO | Admitting: *Deleted

## 2016-12-15 DIAGNOSIS — M25511 Pain in right shoulder: Secondary | ICD-10-CM | POA: Diagnosis not present

## 2016-12-15 DIAGNOSIS — M25611 Stiffness of right shoulder, not elsewhere classified: Secondary | ICD-10-CM

## 2016-12-15 NOTE — Therapy (Signed)
[facility] [place], [place], [address] Phone: [phone]   Fax:  [phone]  Physical Therapy Treatment  Patient Details  Name: Carol Cox MRN: [id number] Date of Birth: [birth year] Referring Provider: Esmond Plants MD.  Encounter Date: 12/15/2016      PT End of Session - 12/15/16 0955    Visit Number 7   Number of Visits 12   Date for PT Re-Evaluation 01/04/17   PT Start Time 0945   PT Stop Time 1037   PT Time Calculation (min) 52 min      Past Medical History:  Diagnosis Date  . Arthritis    "knee, left hip, ankles"   . Borderline glaucoma   . CAD (coronary artery disease)    Mild nonobstructive CAD by cath 02/06/13 (25% mid LAD), normal EF  . Family history of adverse reaction to anesthesia    mother has problems with nausea and vomiting   . History of colitis    02/ 2016  acute infectious colitis-- resolved  . Hyperlipidemia   . Left ovarian cyst   . PONV (postoperative nausea and vomiting)    SEVERE    Past Surgical History:  Procedure Laterality Date  . CARPAL TUNNEL RELEASE Bilateral 1990's  . KNEE ARTHROSCOPY W/ MENISCECTOMY Left 07/2014  . LEFT HEART CATHETERIZATION WITH CORONARY ANGIOGRAM N/A 02/06/2013   Procedure: LEFT HEART CATHETERIZATION WITH CORONARY ANGIOGRAM;  Surgeon: [name] Hampshire, MD;  Location: [place] [facility];  Service: Cardiovascular;  Laterality: N/A;  non-obstruction 25% mLAD,  normal LVF , ef 65-70%  . left knee meniscus surgery     . TOTAL KNEE ARTHROPLASTY Left 03/26/2015   Procedure: LEFT TOTAL KNEE ARTHROPLASTY;  Surgeon: Paralee Cancel, MD;  Location: [facility];  Service: Orthopedics;  Laterality: Left;    There were no vitals filed for this visit.      Subjective Assessment - 12/15/16 0954    Subjective RT shldr has been hurting less 2/ 10. Doing better now   Patient Stated Goals Use right UE without pain.   Currently in Pain? Yes   Pain Score 4    Pain Location  Shoulder   Pain Orientation Right   Pain Descriptors / Indicators Aching;Sore   Pain Type Acute pain;Surgical pain   Pain Onset 1 to 4 weeks ago   Pain Frequency Constant                         [facility] Adult PT Treatment/Exercise - 12/15/16 0001      Exercises   Exercises Shoulder     Modalities   Modalities Electrical Stimulation;Cryotherapy     Vasopneumatic   Number Minutes Vasopneumatic  15 minutes   Vasopnuematic Location  Shoulder   Vasopneumatic Pressure Medium   Vasopneumatic Temperature  3 snowflakes     Manual Therapy   Manual Therapy Passive ROM   Passive ROM In supine PAROM to patient's right shoulder flexion to 145 degrees,, ABD to 60, and ER to 45                     PT Long Term Goals - 11/25/16 1416      PT LONG TERM GOAL #1   Title Ind with HEP.   Time 6   Period Weeks   Status On-going     PT LONG TERM GOAL #2   Title Active right shoulder flexion to 150 degrees so the patient can  easily reach overhead.   Time 6   Period Weeks   Status New     PT LONG TERM GOAL #3   Title Active ER to 70 degrees+ to allow for easily donning/doffing of apparel   Time 6   Period Weeks     PT LONG TERM GOAL #4   Title Increase ROM so patient is able to reach behind back to L3.   Time 6   Period Weeks   Status New     PT LONG TERM GOAL #5   Title Increase right shoulder strength to a solid 4+/5 to increase stability for performance of functional activities   Time 6   Status New     PT LONG TERM GOAL #6   Title Perform ADL's with pain not > 3/10.   Time 6   Period Weeks   Status New               Plan - 12/15/16 [id number]    Clinical Impression Statement Pt arrived to clinic with a little increased RT shldr soreness after working her arm preety good. By the end of Rx she was able to record good measurements again Flexion at 150 degrees, ER55 and Abd to 70. normal response to modalities.   Rehab Potential Excellent   PT  Frequency 2x / week   PT Duration 6 weeks   PT Treatment/Interventions ADLs/Self Care Home Management;Cryotherapy;Electrical Stimulation;Patient/family education;Therapeutic exercise;Therapeutic activities;Manual techniques;Passive range of motion;Vasopneumatic Device   PT Next Visit Plan In supine:  PAROM/AAROM to patient's right shoulder.  Vasopneumatic   PT Home Exercise Plan added cervical retraction and shoulder depression to help with neck pain   Consulted and Agree with Plan of Care Patient      Patient will benefit from skilled therapeutic intervention in order to improve the following deficits and impairments:  Pain, Decreased activity tolerance, Decreased range of motion  Visit Diagnosis: Acute pain of right shoulder  Stiffness of right shoulder joint     Problem List Patient Active Problem List   Diagnosis Date Noted  . Obese 03/27/2015  . S/P left TKA 03/26/2015  . S/P knee replacement 03/26/2015  . Colitis, acute 12/01/2014  . Weight loss, unintentional 12/01/2014  . Adnexal cyst 12/01/2014  . Elevated fasting glucose 12/01/2014  . CAD (coronary artery disease) 12/01/2014  . Osteoarthritis 12/01/2014  . Vasovagal attack 02/28/2013  . Hyperlipidemia 02/28/2013    Milam Allbaugh,CHRIS, PTA 12/15/2016, 10:46 AM  [facility] [address] [place], [place], [address] Phone: [phone]   Fax:  [phone]  Name: SHAVELL BOWDISH MRN: [id number] Date of Birth: 1951-05-29

## 2016-12-17 ENCOUNTER — Ambulatory Visit: Payer: BC Managed Care – PPO | Admitting: *Deleted

## 2016-12-17 DIAGNOSIS — M25511 Pain in right shoulder: Secondary | ICD-10-CM

## 2016-12-17 DIAGNOSIS — M25611 Stiffness of right shoulder, not elsewhere classified: Secondary | ICD-10-CM

## 2016-12-17 NOTE — Therapy (Signed)
[facility] [place], [place], [address] Phone: [phone]   Fax:  [phone]  Physical Therapy Treatment  Patient Details  Name: LAKINDRA MANIKOWSKI MRN: [id number] Date of Birth: Dec 09, 1950 Referring Provider: Esmond Plants MD.  Encounter Date: 12/17/2016      PT End of Session - 12/17/16 1111    Visit Number 8   Number of Visits 12   Date for PT Re-Evaluation 01/04/17   PT Start Time 0945   PT Stop Time 1034   PT Time Calculation (min) 49 min      Past Medical History:  Diagnosis Date  . Arthritis    "knee, left hip, ankles"   . Borderline glaucoma   . CAD (coronary artery disease)    Mild nonobstructive CAD by cath 02/06/13 (25% mid LAD), normal EF  . Family history of adverse reaction to anesthesia    mother has problems with nausea and vomiting   . History of colitis    02/ 2016  acute infectious colitis-- resolved  . Hyperlipidemia   . Left ovarian cyst   . PONV (postoperative nausea and vomiting)    SEVERE    Past Surgical History:  Procedure Laterality Date  . CARPAL TUNNEL RELEASE Bilateral 1990's  . KNEE ARTHROSCOPY W/ MENISCECTOMY Left 07/2014  . LEFT HEART CATHETERIZATION WITH CORONARY ANGIOGRAM N/A 02/06/2013   Procedure: LEFT HEART CATHETERIZATION WITH CORONARY ANGIOGRAM;  Surgeon: [name] Hampshire, MD;  Location: [place] [facility];  Service: Cardiovascular;  Laterality: N/A;  non-obstruction 25% mLAD,  normal LVF , ef 65-70%  . left knee meniscus surgery     . TOTAL KNEE ARTHROPLASTY Left 03/26/2015   Procedure: LEFT TOTAL KNEE ARTHROPLASTY;  Surgeon: Paralee Cancel, MD;  Location: [facility];  Service: Orthopedics;  Laterality: Left;    There were no vitals filed for this visit.      Subjective Assessment - 12/17/16 1110    Subjective RT shldr has been hurting less 2/ 10. Doing better now. Able to sleep lastnight    Patient Stated Goals Use right UE without pain.   Currently in Pain? Yes   Pain Score  2    Pain Location Shoulder   Pain Orientation Right   Pain Descriptors / Indicators Aching;Sore   Pain Type Acute pain   Pain Onset 1 to 4 weeks ago   Pain Frequency Constant                         [facility] Adult PT Treatment/Exercise - 12/17/16 0001      Modalities   Modalities Electrical Stimulation;Moist Heat     Moist Heat Therapy   Number Minutes Moist Heat 15 Minutes   Moist Heat Location Shoulder     Electrical Stimulation   Electrical Stimulation Location Rt shldr premod x 15 mins 80-150hz  supine   Electrical Stimulation Goals Pain     Manual Therapy   Manual Therapy Passive ROM   Passive ROM In supine PAROM to patient's right shoulder flexion to 160 degrees,, ABD to 80, and ER to 70                     PT Long Term Goals - 11/25/16 1416      PT LONG TERM GOAL #1   Title Ind with HEP.   Time 6   Period Weeks   Status On-going     PT LONG TERM GOAL #2   Title  Active right shoulder flexion to 150 degrees so the patient can easily reach overhead.   Time 6   Period Weeks   Status New     PT LONG TERM GOAL #3   Title Active ER to 70 degrees+ to allow for easily donning/doffing of apparel   Time 6   Period Weeks     PT LONG TERM GOAL #4   Title Increase ROM so patient is able to reach behind back to L3.   Time 6   Period Weeks   Status New     PT LONG TERM GOAL #5   Title Increase right shoulder strength to a solid 4+/5 to increase stability for performance of functional activities   Time 6   Status New     PT LONG TERM GOAL #6   Title Perform ADL's with pain not > 3/10.   Time 6   Period Weeks   Status New               Plan - 12/17/16 1113    Clinical Impression Statement Pt arrived to clinic today with decreased pain and states having her best night yet. She also did bbetter with PROM today with less MM gaurding/tension. She was able to raech PROM for flexion 160 degrees, ER to 70, and IR to 68 degrees. 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  Vasopneumatic   Consulted and Agree with Plan of Care Patient      Patient will benefit from skilled therapeutic intervention in order to improve the following deficits and impairments:  Pain, Decreased activity tolerance, Decreased range of motion  Visit Diagnosis: Acute pain of right shoulder  Stiffness of right shoulder joint     Problem List Patient Active Problem List   Diagnosis Date Noted  . Obese 03/27/2015  . S/P left TKA 03/26/2015  . S/P knee replacement 03/26/2015  . Colitis, acute 12/01/2014  . Weight loss, unintentional 12/01/2014  . Adnexal cyst 12/01/2014  . Elevated fasting glucose 12/01/2014  . CAD (coronary artery disease) 12/01/2014  . Osteoarthritis 12/01/2014  . Vasovagal attack 02/28/2013  . Hyperlipidemia 02/28/2013    Markus Casten,CHRIS, PTA 12/17/2016, 11:23 AM  [facility] [address] [place], [place], [address] Phone: [phone]   Fax:  [phone]  Name: JERMIYAH MCALHANY MRN: [id number] Date of Birth: 20-Apr-1951

## 2016-12-21 ENCOUNTER — Encounter: Payer: Self-pay | Admitting: Physical Therapy

## 2016-12-21 ENCOUNTER — Ambulatory Visit: Payer: BC Managed Care – PPO | Admitting: Physical Therapy

## 2016-12-21 DIAGNOSIS — M25511 Pain in right shoulder: Secondary | ICD-10-CM

## 2016-12-21 DIAGNOSIS — M25611 Stiffness of right shoulder, not elsewhere classified: Secondary | ICD-10-CM

## 2016-12-21 NOTE — Therapy (Signed)
[facility] [place], [place], [address] Phone: [phone]   Fax:  [phone]  Physical Therapy Treatment  Patient Details  Name: Carol Cox MRN: [id number] Date of Birth: [birth year] Referring Provider: Esmond Plants MD.  Encounter Date: 12/21/2016      PT End of Session - 12/21/16 1020    Visit Number 9   Number of Visits 12   Date for PT Re-Evaluation 01/04/17   PT Start Time 0952   PT Stop Time 1036   PT Time Calculation (min) 44 min   Activity Tolerance Patient tolerated treatment well   Behavior During Therapy [facility] for tasks assessed/performed      Past Medical History:  Diagnosis Date  . Arthritis    "knee, left hip, ankles"   . Borderline glaucoma   . CAD (coronary artery disease)    Mild nonobstructive CAD by cath 02/06/13 (25% mid LAD), normal EF  . Family history of adverse reaction to anesthesia    mother has problems with nausea and vomiting   . History of colitis    02/ 2016  acute infectious colitis-- resolved  . Hyperlipidemia   . Left ovarian cyst   . PONV (postoperative nausea and vomiting)    SEVERE    Past Surgical History:  Procedure Laterality Date  . CARPAL TUNNEL RELEASE Bilateral 1990's  . KNEE ARTHROSCOPY W/ MENISCECTOMY Left 07/2014  . LEFT HEART CATHETERIZATION WITH CORONARY ANGIOGRAM N/A 02/06/2013   Procedure: LEFT HEART CATHETERIZATION WITH CORONARY ANGIOGRAM;  Surgeon: [name] Hampshire, MD;  Location: [facility];  Service: Cardiovascular;  Laterality: N/A;  non-obstruction 25% mLAD,  normal LVF , ef 65-70%  . left knee meniscus surgery     . TOTAL KNEE ARTHROPLASTY Left 03/26/2015   Procedure: LEFT TOTAL KNEE ARTHROPLASTY;  Surgeon: Paralee Cancel, MD;  Location: [facility];  Service: Orthopedics;  Laterality: Left;    There were no vitals filed for this visit.      Subjective Assessment - 12/21/16 0955    Subjective Patient arrived with litle pain complaints and did well  after last treatment   Patient Stated Goals Use right UE without pain.   Currently in Pain? Yes   Pain Score 2    Pain Location Shoulder   Pain Orientation Right   Pain Type Acute pain   Pain Onset More than a month ago   Aggravating Factors  movement   Pain Relieving Factors rest            [facility] PT Assessment - 12/21/16 0001      ROM / Strength   AROM / PROM / Strength PROM     PROM   Overall PROM Comments PAROM   PROM Assessment Site Shoulder   Right/Left Shoulder Right   Right Shoulder Flexion 160 Degrees   Right Shoulder External Rotation 70 Degrees                     [facility] Adult PT Treatment/Exercise - 12/21/16 0001      Moist Heat Therapy   Number Minutes Moist Heat 15 Minutes   Moist Heat Location Shoulder     Electrical Stimulation   Electrical Stimulation Location Rt shldr premod x 15 mins 80-150hz  supine   Electrical Stimulation Goals Pain     Manual Therapy   Manual Therapy Passive ROM   Passive ROM In supine PAROM to patient's right shoulder flexion, ER with gentle range  PT Long Term Goals - 11/25/16 1416      PT LONG TERM GOAL #1   Title Ind with HEP.   Time 6   Period Weeks   Status On-going     PT LONG TERM GOAL #2   Title Active right shoulder flexion to 150 degrees so the patient can easily reach overhead.   Time 6   Period Weeks   Status New     PT LONG TERM GOAL #3   Title Active ER to 70 degrees+ to allow for easily donning/doffing of apparel   Time 6   Period Weeks     PT LONG TERM GOAL #4   Title Increase ROM so patient is able to reach behind back to L3.   Time 6   Period Weeks   Status New     PT LONG TERM GOAL #5   Title Increase right shoulder strength to a solid 4+/5 to increase stability for performance of functional activities   Time 6   Status New     PT LONG TERM GOAL #6   Title Perform ADL's with pain not > 3/10.   Time 6   Period Weeks   Status New                Plan - 12/21/16 1021    Clinical Impression Statement Patient tolerated treatment well today and has reported little soreness overall. Patient reports doing HEP daily per MD with no difficulty. Patient has improved ROM overall and minimal muscle guarding. Patient goals ongoing due to protocol/healing limitations.   Rehab Potential Excellent   PT Frequency 2x / week   PT Duration 6 weeks   PT Treatment/Interventions ADLs/Self Care Home Management;Cryotherapy;Electrical Stimulation;Patient/family education;Therapeutic exercise;Therapeutic activities;Manual techniques;Passive range of motion;Vasopneumatic Device   PT Next Visit Plan In supine:  PAROM/AAROM to patient's right shoulder.  Vasopneumatic (MD note to Dr. Veverly Fells next visit)   Consulted and Agree with Plan of Care Patient      Patient will benefit from skilled therapeutic intervention in order to improve the following deficits and impairments:  Pain, Decreased activity tolerance, Decreased range of motion  Visit Diagnosis: Acute pain of right shoulder  Stiffness of right shoulder joint     Problem List Patient Active Problem List   Diagnosis Date Noted  . Obese 03/27/2015  . S/P left TKA 03/26/2015  . S/P knee replacement 03/26/2015  . Colitis, acute 12/01/2014  . Weight loss, unintentional 12/01/2014  . Adnexal cyst 12/01/2014  . Elevated fasting glucose 12/01/2014  . CAD (coronary artery disease) 12/01/2014  . Osteoarthritis 12/01/2014  . Vasovagal attack 02/28/2013  . Hyperlipidemia 02/28/2013    Phillips Climes, PTA 12/21/2016, 10:45 AM  [facility] [place], [place], [address] Phone: [phone]   Fax:  [phone]  Name: Carol Cox MRN: [id number] Date of Birth: 09/16/1951

## 2016-12-23 ENCOUNTER — Ambulatory Visit: Payer: BC Managed Care – PPO | Admitting: Physical Therapy

## 2016-12-23 ENCOUNTER — Encounter: Payer: Self-pay | Admitting: Physical Therapy

## 2016-12-23 DIAGNOSIS — M25611 Stiffness of right shoulder, not elsewhere classified: Secondary | ICD-10-CM

## 2016-12-23 DIAGNOSIS — M25511 Pain in right shoulder: Secondary | ICD-10-CM

## 2016-12-23 NOTE — Therapy (Signed)
[facility] [place], [place], [address] Phone: [phone]   Fax:  [phone]  Physical Therapy Treatment  Patient Details  Name: Carol Cox MRN: [id number] Date of Birth: 1951/03/05 Referring Provider: Esmond Plants MD.  Encounter Date: 12/23/2016      PT End of Session - 12/23/16 1012    Visit Number 10   Number of Visits 12   Date for PT Re-Evaluation 01/04/17   PT Start Time 0948   PT Stop Time 1034   PT Time Calculation (min) 46 min   Activity Tolerance Patient tolerated treatment well   Behavior During Therapy [facility] for tasks assessed/performed      Past Medical History:  Diagnosis Date  . Arthritis    "knee, left hip, ankles"   . Borderline glaucoma   . CAD (coronary artery disease)    Mild nonobstructive CAD by cath 02/06/13 (25% mid LAD), normal EF  . Family history of adverse reaction to anesthesia    mother has problems with nausea and vomiting   . History of colitis    02/ 2016  acute infectious colitis-- resolved  . Hyperlipidemia   . Left ovarian cyst   . PONV (postoperative nausea and vomiting)    SEVERE    Past Surgical History:  Procedure Laterality Date  . CARPAL TUNNEL RELEASE Bilateral 1990's  . KNEE ARTHROSCOPY W/ MENISCECTOMY Left 07/2014  . LEFT HEART CATHETERIZATION WITH CORONARY ANGIOGRAM N/A 02/06/2013   Procedure: LEFT HEART CATHETERIZATION WITH CORONARY ANGIOGRAM;  Surgeon: [name] Hampshire, MD;  Location: [facility] CATH LAB;  Service: Cardiovascular;  Laterality: N/A;  non-obstruction 25% mLAD,  normal LVF , ef 65-70%  . left knee meniscus surgery     . TOTAL KNEE ARTHROPLASTY Left 03/26/2015   Procedure: LEFT TOTAL KNEE ARTHROPLASTY;  Surgeon: Paralee Cancel, MD;  Location: [facility];  Service: Orthopedics;  Laterality: Left;    There were no vitals filed for this visit.      Subjective Assessment - 12/23/16 0957    Subjective Patient had some increased soreness yesterday for  unknown reason, better today    Patient Stated Goals Use right UE without pain.   Currently in Pain? Yes   Pain Score 2    Pain Location Shoulder   Pain Orientation Right   Pain Descriptors / Indicators Aching;Sore   Pain Type Acute pain   Pain Onset More than a month ago   Pain Frequency Constant   Aggravating Factors  movement   Pain Relieving Factors rest            [facility] PT Assessment - 12/23/16 0001      PROM   Overall PROM Comments PAROM   PROM Assessment Site Shoulder   Right/Left Shoulder Right   Right Shoulder Flexion 150 Degrees   Right Shoulder External Rotation 71 Degrees                     [facility] Adult PT Treatment/Exercise - 12/23/16 0001      Moist Heat Therapy   Number Minutes Moist Heat 15 Minutes   Moist Heat Location Shoulder     Electrical Stimulation   Electrical Stimulation Location Rt shldr premod x 15 mins 80-150hz  supine   Electrical Stimulation Goals Pain     Manual Therapy   Manual Therapy Passive ROM   Passive ROM In supine PAROM to patient's right shoulder flexion, ER with gentle range  PT Long Term Goals - 12/23/16 1024      PT LONG TERM GOAL #1   Title Ind with HEP.   Time 6   Period Weeks   Status On-going     PT LONG TERM GOAL #2   Title Active right shoulder flexion to 150 degrees so the patient can easily reach overhead.   Time 6   Period Weeks   Status On-going     PT LONG TERM GOAL #3   Title Active ER to 70 degrees+ to allow for easily donning/doffing of apparel   Time 6   Period Weeks   Status On-going     PT LONG TERM GOAL #4   Title Increase ROM so patient is able to reach behind back to L3.   Time 6   Period Weeks   Status On-going     PT LONG TERM GOAL #5   Title Increase right shoulder strength to a solid 4+/5 to increase stability for performance of functional activities   Time 6   Period Weeks   Status On-going     PT LONG TERM GOAL #6   Title Perform  ADL's with pain not > 3/10.   Period Weeks   Status On-going               Plan - 12/23/16 1019    Clinical Impression Statement Patient continues to tolerate treatment with no reported increased pain. Patient has improved ROM overall and continues to perform HEP per MD instucted. Patient has no muscle guarding and is progressing toward goals per protocol..   Rehab Potential Excellent   PT Frequency 2x / week   PT Duration 6 weeks   PT Treatment/Interventions ADLs/Self Care Home Management;Cryotherapy;Electrical Stimulation;Patient/family education;Therapeutic exercise;Therapeutic activities;Manual techniques;Passive range of motion;Vasopneumatic Device   PT Next Visit Plan In supine:  PAROM/AAROM to patient's right shoulder.  Vasopneumatic (MD Veverly Fells 12/24/16)   Consulted and Agree with Plan of Care Patient      Patient will benefit from skilled therapeutic intervention in order to improve the following deficits and impairments:  Pain, Decreased activity tolerance, Decreased range of motion  Visit Diagnosis: Acute pain of right shoulder  Stiffness of right shoulder joint     Problem List Patient Active Problem List   Diagnosis Date Noted  . Obese 03/27/2015  . S/P left TKA 03/26/2015  . S/P knee replacement 03/26/2015  . Colitis, acute 12/01/2014  . Weight loss, unintentional 12/01/2014  . Adnexal cyst 12/01/2014  . Elevated fasting glucose 12/01/2014  . CAD (coronary artery disease) 12/01/2014  . Osteoarthritis 12/01/2014  . Vasovagal attack 02/28/2013  . Hyperlipidemia 02/28/2013    Carol Cox, PTA 12/23/16 12:23 PM Mali Applegate MPT [facility] [place], [place], [address] Phone: [phone]   Fax:  [phone]  Name: Carol Cox MRN: [id number] Date of Birth: 1951/07/31

## 2016-12-28 ENCOUNTER — Other Ambulatory Visit: Payer: Self-pay | Admitting: Nurse Practitioner

## 2016-12-28 ENCOUNTER — Other Ambulatory Visit: Payer: Self-pay | Admitting: Cardiology

## 2016-12-28 MED ORDER — EZETIMIBE-SIMVASTATIN 10-40 MG PO TABS: 1.0000 | ORAL_TABLET | Freq: Every day | ORAL | 0 refills | Status: DC

## 2016-12-29 ENCOUNTER — Ambulatory Visit: Payer: BC Managed Care – PPO | Attending: Orthopedic Surgery | Admitting: *Deleted

## 2016-12-29 DIAGNOSIS — M25611 Stiffness of right shoulder, not elsewhere classified: Secondary | ICD-10-CM | POA: Diagnosis present

## 2016-12-29 DIAGNOSIS — M25511 Pain in right shoulder: Secondary | ICD-10-CM | POA: Diagnosis not present

## 2016-12-29 NOTE — Therapy (Signed)
[facility] [place], [place], [address] Phone: [phone]   Fax:  [phone]  Physical Therapy Treatment  Patient Details  Name: Carol Cox MRN: [id number] Date of Birth: [birth year] Referring Provider: Esmond Plants MD.  Encounter Date: 12/29/2016      PT End of Session - 12/29/16 1003    Visit Number 11   Number of Visits 24   Date for PT Re-Evaluation 01/29/17  NO to cont PT   PT Start Time 0945   PT Stop Time 1048   PT Time Calculation (min) 63 min      Past Medical History:  Diagnosis Date  . Arthritis    "knee, left hip, ankles"   . Borderline glaucoma   . CAD (coronary artery disease)    Mild nonobstructive CAD by cath 02/06/13 (25% mid LAD), normal EF  . Family history of adverse reaction to anesthesia    mother has problems with nausea and vomiting   . History of colitis    02/ 2016  acute infectious colitis-- resolved  . Hyperlipidemia   . Left ovarian cyst   . PONV (postoperative nausea and vomiting)    SEVERE    Past Surgical History:  Procedure Laterality Date  . CARPAL TUNNEL RELEASE Bilateral 1990's  . KNEE ARTHROSCOPY W/ MENISCECTOMY Left 07/2014  . LEFT HEART CATHETERIZATION WITH CORONARY ANGIOGRAM N/A 02/06/2013   Procedure: LEFT HEART CATHETERIZATION WITH CORONARY ANGIOGRAM;  Surgeon: [name] Hampshire, MD;  Location: [place] CATH [facility];  Service: Cardiovascular;  Laterality: N/A;  non-obstruction 25% mLAD,  normal LVF , ef 65-70%  . left knee meniscus surgery     . TOTAL KNEE ARTHROPLASTY Left 03/26/2015   Procedure: LEFT TOTAL KNEE ARTHROPLASTY;  Surgeon: Paralee Cancel, MD;  Location: [facility];  Service: Orthopedics;  Laterality: Left;    There were no vitals filed for this visit.      Subjective Assessment - 12/29/16 [id number]    Subjective MD note to progress to AAROM. MD was pleased   Patient Stated Goals Use right UE without pain.   Currently in Pain? Yes   Pain Score 2    Pain Location  Shoulder   Pain Orientation Right   Pain Descriptors / Indicators Aching;Sore   Pain Type Acute pain   Pain Onset More than a month ago   Pain Frequency Constant                                      PT Long Term Goals - 12/23/16 1024      PT LONG TERM GOAL #1   Title Ind with HEP.   Time 6   Period Weeks   Status On-going     PT LONG TERM GOAL #2   Title Active right shoulder flexion to 150 degrees so the patient can easily reach overhead.   Time 6   Period Weeks   Status On-going     PT LONG TERM GOAL #3   Title Active ER to 70 degrees+ to allow for easily donning/doffing of apparel   Time 6   Period Weeks   Status On-going     PT LONG TERM GOAL #4   Title Increase ROM so patient is able to reach behind back to L3.   Time 6   Period Weeks   Status On-going     PT LONG TERM GOAL #5  Title Increase right shoulder strength to a solid 4+/5 to increase stability for performance of functional activities   Time 6   Period Weeks   Status On-going     PT LONG TERM GOAL #6   Title Perform ADL's with pain not > 3/10.   Period Weeks   Status On-going               Plan - 12/29/16 1012    Clinical Impression Statement Pt arrives today with a new order from MD to cont. PT and progress with AAROM/PROM. See MD note for other progressions. Pt did great with new AAROM HEP and in clinic   Rehab Potential Excellent   PT Frequency 2x / week   PT Duration 6 weeks   PT Treatment/Interventions ADLs/Self Care Home Management;Cryotherapy;Electrical Stimulation;Patient/family education;Therapeutic exercise;Therapeutic activities;Manual techniques;Passive range of motion;Vasopneumatic Device   PT Next Visit Plan In supine:  PAROM/AAROM    SEE new MD Order scanned   on 12-29-16    (MD Veverly Fells 01/24/17)   PT Home Exercise Plan added cervical retraction and shoulder depression to help with neck pain   Consulted and Agree with Plan of Care Patient       Patient will benefit from skilled therapeutic intervention in order to improve the following deficits and impairments:  Pain, Decreased activity tolerance, Decreased range of motion  Visit Diagnosis: Acute pain of right shoulder  Stiffness of right shoulder joint     Problem List Patient Active Problem List   Diagnosis Date Noted  . Obese 03/27/2015  . S/P left TKA 03/26/2015  . S/P knee replacement 03/26/2015  . Colitis, acute 12/01/2014  . Weight loss, unintentional 12/01/2014  . Adnexal cyst 12/01/2014  . Elevated fasting glucose 12/01/2014  . CAD (coronary artery disease) 12/01/2014  . Osteoarthritis 12/01/2014  . Vasovagal attack 02/28/2013  . Hyperlipidemia 02/28/2013    Mellina Benison,CHRIS, PTA 12/29/2016, 10:51 AM  [facility] [address] [place], [place], [address] Phone: [phone]   Fax:  [phone]  Name: Carol Cox MRN: [id number] Date of Birth: [birth year]

## 2016-12-31 ENCOUNTER — Ambulatory Visit: Payer: BC Managed Care – PPO | Admitting: *Deleted

## 2016-12-31 DIAGNOSIS — M25611 Stiffness of right shoulder, not elsewhere classified: Secondary | ICD-10-CM

## 2016-12-31 DIAGNOSIS — M25511 Pain in right shoulder: Secondary | ICD-10-CM | POA: Diagnosis not present

## 2016-12-31 NOTE — Therapy (Signed)
[facility] [place], [place], [address] Phone: [phone]   Fax:  [phone]  Physical Therapy Treatment  Patient Details  Name: Carol Cox MRN: [id number] Date of Birth: May 26, 1951 Referring Provider: Esmond Plants MD.  Encounter Date: 12/29/2016    Past Medical History:  Diagnosis Date  . Arthritis    "knee, left hip, ankles"   . Borderline glaucoma   . CAD (coronary artery disease)    Mild nonobstructive CAD by cath 02/06/13 (25% mid LAD), normal EF  . Family history of adverse reaction to anesthesia    mother has problems with nausea and vomiting   . History of colitis    02/ 2016  acute infectious colitis-- resolved  . Hyperlipidemia   . Left ovarian cyst   . PONV (postoperative nausea and vomiting)    SEVERE    Past Surgical History:  Procedure Laterality Date  . CARPAL TUNNEL RELEASE Bilateral 1990's  . KNEE ARTHROSCOPY W/ MENISCECTOMY Left 07/2014  . LEFT HEART CATHETERIZATION WITH CORONARY ANGIOGRAM N/A 02/06/2013   Procedure: LEFT HEART CATHETERIZATION WITH CORONARY ANGIOGRAM;  Surgeon: [name] Hampshire, MD;  Location: [facility] CATH [facility];  Service: Cardiovascular;  Laterality: N/A;  non-obstruction 25% mLAD,  normal LVF , ef 65-70%  . left knee meniscus surgery     . TOTAL KNEE ARTHROPLASTY Left 03/26/2015   Procedure: LEFT TOTAL KNEE ARTHROPLASTY;  Surgeon: Paralee Cancel, MD;  Location: [facility];  Service: Orthopedics;  Laterality: Left;    There were no vitals filed for this visit.   Sitting pulleys x 5 mins, Sitting UE ranger x 5 mins, Supine cane press and flexion x 10. Manual PROM all motions. Premod and Vaso x 15 mins                                 PT Long Term Goals - 12/23/16 1024      PT LONG TERM GOAL #1   Title Ind with HEP.   Time 6   Period Weeks   Status On-going     PT LONG TERM GOAL #2   Title Active right shoulder flexion to 150 degrees so the patient can  easily reach overhead.   Time 6   Period Weeks   Status On-going     PT LONG TERM GOAL #3   Title Active ER to 70 degrees+ to allow for easily donning/doffing of apparel   Time 6   Period Weeks   Status On-going     PT LONG TERM GOAL #4   Title Increase ROM so patient is able to reach behind back to L3.   Time 6   Period Weeks   Status On-going     PT LONG TERM GOAL #5   Title Increase right shoulder strength to a solid 4+/5 to increase stability for performance of functional activities   Time 6   Period Weeks   Status On-going     PT LONG TERM GOAL #6   Title Perform ADL's with pain not > 3/10.   Period Weeks   Status On-going             Patient will benefit from skilled therapeutic intervention in order to improve the following deficits and impairments:  Pain, Decreased activity tolerance, Decreased range of motion  Visit Diagnosis: Acute pain of right shoulder  Stiffness of right shoulder joint     Problem List Patient  Active Problem List   Diagnosis Date Noted  . Obese 03/27/2015  . S/P left TKA 03/26/2015  . S/P knee replacement 03/26/2015  . Colitis, acute 12/01/2014  . Weight loss, unintentional 12/01/2014  . Adnexal cyst 12/01/2014  . Elevated fasting glucose 12/01/2014  . CAD (coronary artery disease) 12/01/2014  . Osteoarthritis 12/01/2014  . Vasovagal attack 02/28/2013  . Hyperlipidemia 02/28/2013    Braxxton Stoudt,CHRIS 12/31/2016, 1:00 PM  [facility] [address] [place], [place], [address] Phone: [phone]   Fax:  [phone]  Name: Carol Cox MRN: [id number] Date of Birth: 1951-09-19

## 2016-12-31 NOTE — Therapy (Signed)
[facility] [place], [place], [address] Phone: [phone]   Fax:  [phone]  Physical Therapy Treatment  Patient Details  Name: Carol Cox MRN: [id number] Date of Birth: 1950/11/28 Referring Provider: Esmond Plants MD.  Encounter Date: 12/31/2016      PT End of Session - 12/31/16 1349    Visit Number 12   Number of Visits 24   Date for PT Re-Evaluation 01/29/17   PT Start Time 1300   PT Stop Time 1351   PT Time Calculation (min) 51 min      Past Medical History:  Diagnosis Date  . Arthritis    "knee, left hip, ankles"   . Borderline glaucoma   . CAD (coronary artery disease)    Mild nonobstructive CAD by cath 02/06/13 (25% mid LAD), normal EF  . Family history of adverse reaction to anesthesia    mother has problems with nausea and vomiting   . History of colitis    02/ 2016  acute infectious colitis-- resolved  . Hyperlipidemia   . Left ovarian cyst   . PONV (postoperative nausea and vomiting)    SEVERE    Past Surgical History:  Procedure Laterality Date  . CARPAL TUNNEL RELEASE Bilateral 1990's  . KNEE ARTHROSCOPY W/ MENISCECTOMY Left 07/2014  . LEFT HEART CATHETERIZATION WITH CORONARY ANGIOGRAM N/A 02/06/2013   Procedure: LEFT HEART CATHETERIZATION WITH CORONARY ANGIOGRAM;  Surgeon: [name] Hampshire, MD;  Location: [facility] CATH [facility];  Service: Cardiovascular;  Laterality: N/A;  non-obstruction 25% mLAD,  normal LVF , ef 65-70%  . left knee meniscus surgery     . TOTAL KNEE ARTHROPLASTY Left 03/26/2015   Procedure: LEFT TOTAL KNEE ARTHROPLASTY;  Surgeon: Paralee Cancel, MD;  Location: [facility];  Service: Orthopedics;  Laterality: Left;    There were no vitals filed for this visit.      Subjective Assessment - 12/31/16 1317    Subjective MD note to progress to AAROM. MD was pleased.   Did good for 2 days, but last night had a sharp pain   Patient Stated Goals Use right UE without pain.   Currently in  Pain? Yes   Pain Score 5    Pain Location Shoulder   Pain Orientation Right   Pain Descriptors / Indicators Aching;Sore   Pain Onset More than a month ago                         [facility] Adult PT Treatment/Exercise - 12/31/16 0001      Shoulder Exercises: Pulleys   Flexion 3 minutes   Other Pulley Exercises Sitting UE ranger x 5 mins      Modalities   Modalities Electrical Stimulation;Moist Heat     Moist Heat Therapy   Number Minutes Moist Heat 15 Minutes   Moist Heat Location Shoulder     Electrical Stimulation   Electrical Stimulation Location Rt shldr premod x 15 mins 80-150hz  supine   Electrical Stimulation Goals Pain     Manual Therapy   Manual Therapy Passive ROM   Passive ROM In supine PAROM to patient's right shoulder flexion to 150 , ER to 70 degrees with gentle range     Reviewed HEP                PT Long Term Goals - 12/23/16 1024      PT LONG TERM GOAL #1   Title Ind with HEP.   Time  6   Period Weeks   Status On-going     PT LONG TERM GOAL #2   Title Active right shoulder flexion to 150 degrees so the patient can easily reach overhead.   Time 6   Period Weeks   Status On-going     PT LONG TERM GOAL #3   Title Active ER to 70 degrees+ to allow for easily donning/doffing of apparel   Time 6   Period Weeks   Status On-going     PT LONG TERM GOAL #4   Title Increase ROM so patient is able to reach behind back to L3.   Time 6   Period Weeks   Status On-going     PT LONG TERM GOAL #5   Title Increase right shoulder strength to a solid 4+/5 to increase stability for performance of functional activities   Time 6   Period Weeks   Status On-going     PT LONG TERM GOAL #6   Title Perform ADL's with pain not > 3/10.   Period Weeks   Status On-going               Plan - 12/31/16 1351    Clinical Impression Statement Pt arrived today with increased soreness and aching. She reports doing well for 2 days and then  had a sharp pain and now an increase in aching. Pt did well with today's Rx and PROM with fexion to 150degrees, and  ER to 70. She was point tender over RT ACJ and had a normal response with modalities. Pt was asked to continue with table top and pendulum exs F/B ice .   Rehab Potential Excellent   PT Frequency 2x / week   PT Duration 6 weeks   PT Treatment/Interventions ADLs/Self Care Home Management;Cryotherapy;Electrical Stimulation;Patient/family education;Therapeutic exercise;Therapeutic activities;Manual techniques;Passive range of motion;Vasopneumatic Device   PT Next Visit Plan In supine:  PAROM/AAROM    SEE new MD Order scanned   on 12-29-16    (MD Veverly Fells 01/24/17)   Consulted and Agree with Plan of Care Patient      Patient will benefit from skilled therapeutic intervention in order to improve the following deficits and impairments:  Pain, Decreased activity tolerance, Decreased range of motion  Visit Diagnosis: Acute pain of right shoulder  Stiffness of right shoulder joint     Problem List Patient Active Problem List   Diagnosis Date Noted  . Obese 03/27/2015  . S/P left TKA 03/26/2015  . S/P knee replacement 03/26/2015  . Colitis, acute 12/01/2014  . Weight loss, unintentional 12/01/2014  . Adnexal cyst 12/01/2014  . Elevated fasting glucose 12/01/2014  . CAD (coronary artery disease) 12/01/2014  . Osteoarthritis 12/01/2014  . Vasovagal attack 02/28/2013  . Hyperlipidemia 02/28/2013    Tanyiah Laurich,CHRIS, PTA 12/31/2016, 1:56 PM  [facility] [address] [place], [place], [address] Phone: [phone]   Fax:  [phone]  Name: Carol Cox MRN: [id number] Date of Birth: 04-15-1951

## 2017-01-05 ENCOUNTER — Ambulatory Visit: Payer: BC Managed Care – PPO | Admitting: Physical Therapy

## 2017-01-05 DIAGNOSIS — M25611 Stiffness of right shoulder, not elsewhere classified: Secondary | ICD-10-CM

## 2017-01-05 DIAGNOSIS — M25511 Pain in right shoulder: Secondary | ICD-10-CM | POA: Diagnosis not present

## 2017-01-05 NOTE — Therapy (Signed)
[facility] [place], [place], [address] Phone: [phone]   Fax:  [phone]  Physical Therapy Treatment  Patient Details  Name: Carol Cox MRN: [id number] Date of Birth: [birth year] Referring Provider: Esmond Plants MD.  Encounter Date: 01/05/2017      PT End of Session - 01/05/17 1120    Visit Number 13   Number of Visits 24   Date for PT Re-Evaluation 01/29/17   PT Start Time 0947   PT Stop Time 1049   PT Time Calculation (min) 62 min   Activity Tolerance Patient tolerated treatment well   Behavior During Therapy [facility] for tasks assessed/performed      Past Medical History:  Diagnosis Date  . Arthritis    "knee, left hip, ankles"   . Borderline glaucoma   . CAD (coronary artery disease)    Mild nonobstructive CAD by cath 02/06/13 (25% mid LAD), normal EF  . Family history of adverse reaction to anesthesia    mother has problems with nausea and vomiting   . History of colitis    02/ 2016  acute infectious colitis-- resolved  . Hyperlipidemia   . Left ovarian cyst   . PONV (postoperative nausea and vomiting)    SEVERE    Past Surgical History:  Procedure Laterality Date  . CARPAL TUNNEL RELEASE Bilateral 1990's  . KNEE ARTHROSCOPY W/ MENISCECTOMY Left 07/2014  . LEFT HEART CATHETERIZATION WITH CORONARY ANGIOGRAM N/A 02/06/2013   Procedure: LEFT HEART CATHETERIZATION WITH CORONARY ANGIOGRAM;  Surgeon: [name] Hampshire, MD;  Location: [place] CATH LAB;  Service: Cardiovascular;  Laterality: N/A;  non-obstruction 25% mLAD,  normal LVF , ef 65-70%  . left knee meniscus surgery     . TOTAL KNEE ARTHROPLASTY Left 03/26/2015   Procedure: LEFT TOTAL KNEE ARTHROPLASTY;  Surgeon: Paralee Cancel, MD;  Location: [facility];  Service: Orthopedics;  Laterality: Left;    There were no vitals filed for this visit.      Subjective Assessment - 01/05/17 1119    Currently in Pain? Yes   Pain Score 3    Pain Location Shoulder    Pain Orientation Right   Pain Descriptors / Indicators Aching;Sore   Pain Type Acute pain   Pain Onset More than a month ago                         [facility] Adult PT Treatment/Exercise - 01/05/17 0001      Exercises   Exercises Shoulder     Shoulder Exercises: Pulleys   Flexion Limitations 8 minutes.   Other Pulley Exercises Standing UE Ranger x 4 minutes with left hand cupping right elbow.   Other Pulley Exercises Wall ladder x 2 minutes.     Modalities   Modalities Electrical Stimulation;Moist Heat     Moist Heat Therapy   Number Minutes Moist Heat 15 Minutes   Moist Heat Location --  Right shoulder.     Acupuncturist Location Right shoulder.   Electrical Stimulation Action Pre-mod.   Electrical Stimulation Parameters constant at 80-150 Hz x 15 minutes.   Electrical Stimulation Goals Pain     Manual Therapy   Manual Therapy Passive ROM   Passive ROM In supine:  PAROM into right shoulder flexion and IR/ER in plane of scapu;la x 24 minutes.                     PT  Long Term Goals - 12/23/16 1024      PT LONG TERM GOAL #1   Title Ind with HEP.   Time 6   Period Weeks   Status On-going     PT LONG TERM GOAL #2   Title Active right shoulder flexion to 150 degrees so the patient can easily reach overhead.   Time 6   Period Weeks   Status On-going     PT LONG TERM GOAL #3   Title Active ER to 70 degrees+ to allow for easily donning/doffing of apparel   Time 6   Period Weeks   Status On-going     PT LONG TERM GOAL #4   Title Increase ROM so patient is able to reach behind back to L3.   Time 6   Period Weeks   Status On-going     PT LONG TERM GOAL #5   Title Increase right shoulder strength to a solid 4+/5 to increase stability for performance of functional activities   Time 6   Period Weeks   Status On-going     PT LONG TERM GOAL #6   Title Perform ADL's with pain not > 3/10.   Period Weeks    Status On-going               Plan - 01/05/17 1118    Clinical Impression Statement Patient doing much better today.     PT Next Visit Plan per NO okay for theraband...recommend yellow theraband beginning with RW4 limited ROM.      Patient will benefit from skilled therapeutic intervention in order to improve the following deficits and impairments:     Visit Diagnosis: Acute pain of right shoulder  Stiffness of right shoulder joint     Problem List Patient Active Problem List   Diagnosis Date Noted  . Obese 03/27/2015  . S/P left TKA 03/26/2015  . S/P knee replacement 03/26/2015  . Colitis, acute 12/01/2014  . Weight loss, unintentional 12/01/2014  . Adnexal cyst 12/01/2014  . Elevated fasting glucose 12/01/2014  . CAD (coronary artery disease) 12/01/2014  . Osteoarthritis 12/01/2014  . Vasovagal attack 02/28/2013  . Hyperlipidemia 02/28/2013    APPLEGATE, [place] MPT 01/05/2017, 11:25 AM  [facility] [address] [place], [place], [address] Phone: [phone]   Fax:  [phone]  Name: Carol Cox MRN: [id number] Date of Birth: October 14, 1951

## 2017-01-07 ENCOUNTER — Ambulatory Visit: Payer: BC Managed Care – PPO | Admitting: *Deleted

## 2017-01-07 DIAGNOSIS — M25511 Pain in right shoulder: Secondary | ICD-10-CM | POA: Diagnosis not present

## 2017-01-07 DIAGNOSIS — M25611 Stiffness of right shoulder, not elsewhere classified: Secondary | ICD-10-CM

## 2017-01-07 NOTE — Therapy (Signed)
[facility] [place], [place], [address] Phone: [phone]   Fax:  [phone]  Physical Therapy Treatment  Patient Details  Name: Carol Cox MRN: [id number] Date of Birth: [birth year] Referring Provider: Esmond Plants MD.  Encounter Date: 01/07/2017      PT End of Session - 01/07/17 1552    Visit Number 14   Number of Visits 24   Date for PT Re-Evaluation 01/29/17   PT Start Time 5573   PT Stop Time 2202   PT Time Calculation (min) 52 min      Past Medical History:  Diagnosis Date  . Arthritis    "knee, left hip, ankles"   . Borderline glaucoma   . CAD (coronary artery disease)    Mild nonobstructive CAD by cath 02/06/13 (25% mid LAD), normal EF  . Family history of adverse reaction to anesthesia    mother has problems with nausea and vomiting   . History of colitis    02/ 2016  acute infectious colitis-- resolved  . Hyperlipidemia   . Left ovarian cyst   . PONV (postoperative nausea and vomiting)    SEVERE    Past Surgical History:  Procedure Laterality Date  . CARPAL TUNNEL RELEASE Bilateral 1990's  . KNEE ARTHROSCOPY W/ MENISCECTOMY Left 07/2014  . LEFT HEART CATHETERIZATION WITH CORONARY ANGIOGRAM N/A 02/06/2013   Procedure: LEFT HEART CATHETERIZATION WITH CORONARY ANGIOGRAM;  Surgeon: [name] Hampshire, MD;  Location: [facility];  Service: Cardiovascular;  Laterality: N/A;  non-obstruction 25% mLAD,  normal LVF , ef 65-70%  . left knee meniscus surgery     . TOTAL KNEE ARTHROPLASTY Left 03/26/2015   Procedure: LEFT TOTAL KNEE ARTHROPLASTY;  Surgeon: Paralee Cancel, MD;  Location: [facility];  Service: Orthopedics;  Laterality: Left;    There were no vitals filed for this visit.                       [facility] Adult PT Treatment/Exercise - 01/07/17 0001      Exercises   Exercises Shoulder     Shoulder Exercises: Pulleys   Other Pulley Exercises Standing UE Ranger x 5 minutes with left  hand cupping right elbow.     Modalities   Modalities Electrical Stimulation;Moist Heat     Moist Heat Therapy   Number Minutes Moist Heat 15 Minutes   Moist Heat Location Shoulder     Electrical Stimulation   Electrical Stimulation Location Rt shldr premod x 15 mins 80-150hz  supine   Electrical Stimulation Goals Pain     Manual Therapy   Manual Therapy Passive ROM   Passive ROM In supine:  PAROM into right shoulder flexion and IR/ER in plane of scapu;la x 24 minutes.                     PT Long Term Goals - 12/23/16 1024      PT LONG TERM GOAL #1   Title Ind with HEP.   Time 6   Period Weeks   Status On-going     PT LONG TERM GOAL #2   Title Active right shoulder flexion to 150 degrees so the patient can easily reach overhead.   Time 6   Period Weeks   Status On-going     PT LONG TERM GOAL #3   Title Active ER to 70 degrees+ to allow for easily donning/doffing of apparel   Time 6   Period Weeks  Status On-going     PT LONG TERM GOAL #4   Title Increase ROM so patient is able to reach behind back to L3.   Time 6   Period Weeks   Status On-going     PT LONG TERM GOAL #5   Title Increase right shoulder strength to a solid 4+/5 to increase stability for performance of functional activities   Time 6   Period Weeks   Status On-going     PT LONG TERM GOAL #6   Title Perform ADL's with pain not > 3/10.   Period Weeks   Status On-going               Plan - 01/07/17 1553    Clinical Impression Statement Pt reports that RT shldr is feeling better with less pain. Pt was able to rest RT UE for about 2 days last week after flare-up and now has been able to resume her normal AAROM therex. Her PROM was much better this week with flexion to 150 degrees and ER to 70. LTGs are ongoing   Rehab Potential Excellent   PT Frequency 2x / week   PT Duration 6 weeks   PT Treatment/Interventions ADLs/Self Care Home Management;Cryotherapy;Electrical  Stimulation;Patient/family education;Therapeutic exercise;Therapeutic activities;Manual techniques;Passive range of motion;Vasopneumatic Device   PT Next Visit Plan per NO okay for theraband...recommend yellow theraband beginning with RW4 limited ROM.   PT Home Exercise Plan added cervical retraction and shoulder depression to help with neck pain   Consulted and Agree with Plan of Care Patient      Patient will benefit from skilled therapeutic intervention in order to improve the following deficits and impairments:  Pain, Decreased activity tolerance, Decreased range of motion  Visit Diagnosis: Acute pain of right shoulder  Stiffness of right shoulder joint     Problem List Patient Active Problem List   Diagnosis Date Noted  . Obese 03/27/2015  . S/P left TKA 03/26/2015  . S/P knee replacement 03/26/2015  . Colitis, acute 12/01/2014  . Weight loss, unintentional 12/01/2014  . Adnexal cyst 12/01/2014  . Elevated fasting glucose 12/01/2014  . CAD (coronary artery disease) 12/01/2014  . Osteoarthritis 12/01/2014  . Vasovagal attack 02/28/2013  . Hyperlipidemia 02/28/2013    RAMSEUR,CHRIS, PTA 01/07/2017, 5:50 PM  [facility] [address] [place], [place], [address] Phone: [phone]   Fax:  [phone]  Name: Carol Cox MRN: [id number] Date of Birth: [birth year]

## 2017-01-12 ENCOUNTER — Ambulatory Visit: Payer: BC Managed Care – PPO | Admitting: Physical Therapy

## 2017-01-12 DIAGNOSIS — M25511 Pain in right shoulder: Secondary | ICD-10-CM

## 2017-01-12 DIAGNOSIS — M25611 Stiffness of right shoulder, not elsewhere classified: Secondary | ICD-10-CM

## 2017-01-12 NOTE — Therapy (Signed)
[facility] [place], [place], [address] Phone: [phone]   Fax:  [phone]  Physical Therapy Treatment  Patient Details  Name: Carol Cox MRN: [id number] Date of Birth: December 09, 1950 Referring Provider: Esmond Plants MD.  Encounter Date: 01/12/2017      PT End of Session - 01/12/17 0903    Visit Number 15   Date for PT Re-Evaluation 01/29/17   PT Start Time 0900   PT Stop Time 0958   PT Time Calculation (min) 58 min   Activity Tolerance Patient tolerated treatment well   Behavior During Therapy [facility] for tasks assessed/performed      Past Medical History:  Diagnosis Date  . Arthritis    "knee, left hip, ankles"   . Borderline glaucoma   . CAD (coronary artery disease)    Mild nonobstructive CAD by cath 02/06/13 (25% mid LAD), normal EF  . Family history of adverse reaction to anesthesia    mother has problems with nausea and vomiting   . History of colitis    02/ 2016  acute infectious colitis-- resolved  . Hyperlipidemia   . Left ovarian cyst   . PONV (postoperative nausea and vomiting)    SEVERE    Past Surgical History:  Procedure Laterality Date  . CARPAL TUNNEL RELEASE Bilateral 1990's  . KNEE ARTHROSCOPY W/ MENISCECTOMY Left 07/2014  . LEFT HEART CATHETERIZATION WITH CORONARY ANGIOGRAM N/A 02/06/2013   Procedure: LEFT HEART CATHETERIZATION WITH CORONARY ANGIOGRAM;  Surgeon: [name] Hampshire, MD;  Location: [facility];  Service: Cardiovascular;  Laterality: N/A;  non-obstruction 25% mLAD,  normal LVF , ef 65-70%  . left knee meniscus surgery     . TOTAL KNEE ARTHROPLASTY Left 03/26/2015   Procedure: LEFT TOTAL KNEE ARTHROPLASTY;  Surgeon: Paralee Cancel, MD;  Location: [facility];  Service: Orthopedics;  Laterality: Left;    There were no vitals filed for this visit.      Subjective Assessment - 01/12/17 0904    Subjective Patient states she irritated her shoulder last night moving the covers in bed.  She denies pain today, just soreness with some movements.   Patient Stated Goals Use right UE without pain.   Currently in Pain? No/denies            [facility] PT Assessment - 01/12/17 0001      AROM   AROM Assessment Site Shoulder   Right/Left Shoulder Right   Right Shoulder Flexion 150 Degrees  supine   Right Shoulder Internal Rotation 71 Degrees  at 90 deg ABD   Right Shoulder External Rotation 56 Degrees  at 90 deg ABD     PROM   PROM Assessment Site Shoulder   Right/Left Shoulder Right   Right Shoulder Flexion 157 Degrees                     [facility] Adult PT Treatment/Exercise - 01/12/17 0001      Shoulder Exercises: Supine   Other Supine Exercises PNF D2 x 10 with PT assist     Shoulder Exercises: Sidelying   Flexion Strengthening;Right;20 reps     Shoulder Exercises: Standing   External Rotation Strengthening;Both;20 reps  Robber     Shoulder Exercises: Pulleys   Flexion --   Flexion Limitations 5 minutes   Other Pulley Exercises standing UE ranger flexion I with R arm x 20     Shoulder Exercises: Isometric Strengthening   Flexion 5X10"   Extension 5X10"  External Rotation 5X10"   Internal Rotation 5X10"   ABduction 5X10"     Modalities   Modalities Electrical Stimulation;Moist Heat     Moist Heat Therapy   Number Minutes Moist Heat 15 Minutes   Moist Heat Location Shoulder     Electrical Stimulation   Electrical Stimulation Location Rt shldr premod x 15 mins 80-'150hz'$  supine   Electrical Stimulation Goals Pain     Manual Therapy   Manual Therapy Passive ROM   Passive ROM to R shoulder into flex, IR, ER                     PT Long Term Goals - 01/12/17 1242      PT LONG TERM GOAL #1   Title Ind with HEP.   Time 6   Period Weeks   Status On-going     PT LONG TERM GOAL #2   Title Active right shoulder flexion to 150 degrees so the patient can easily reach overhead.   Baseline Met in supine 01/12/17   Time 6    Period Weeks   Status Partially Met     PT LONG TERM GOAL #3   Title Active ER to 70 degrees+ to allow for easily donning/doffing of apparel   Time 6   Period Weeks   Status On-going     PT LONG TERM GOAL #4   Title Increase ROM so patient is able to reach behind back to L3.   Baseline 71 degrees actively 01/12/17   Period Weeks   Status On-going     PT LONG TERM GOAL #5   Title Increase right shoulder strength to a solid 4+/5 to increase stability for performance of functional activities   Time 6   Period Weeks     PT LONG TERM GOAL #6   Title Perform ADL's with pain not > 3/10.   Time 6   Period Weeks               Plan - 01/12/17 1244    Clinical Impression Statement Patient did very well with introduction of strengthening. No increase in pain was reported. She is progressing well with ROM as well.    Rehab Potential Excellent   PT Frequency 2x / week   PT Duration 6 weeks   PT Treatment/Interventions ADLs/Self Care Home Management;Cryotherapy;Electrical Stimulation;Patient/family education;Therapeutic exercise;Therapeutic activities;Manual techniques;Passive range of motion;Vasopneumatic Device   PT Next Visit Plan Continue strenghtening; progress isometrics to TB per NO..recommend yellow theraband beginning with RW4 limited ROM.   PT Home Exercise Plan added cervical retraction and shoulder depression to help with neck pain   Consulted and Agree with Plan of Care Patient      Patient will benefit from skilled therapeutic intervention in order to improve the following deficits and impairments:  Pain, Decreased activity tolerance, Decreased range of motion  Visit Diagnosis: Stiffness of right shoulder joint  Acute pain of right shoulder     Problem List Patient Active Problem List   Diagnosis Date Noted  . Obese 03/27/2015  . S/P left TKA 03/26/2015  . S/P knee replacement 03/26/2015  . Colitis, acute 12/01/2014  . Weight loss, unintentional 12/01/2014   . Adnexal cyst 12/01/2014  . Elevated fasting glucose 12/01/2014  . CAD (coronary artery disease) 12/01/2014  . Osteoarthritis 12/01/2014  . Vasovagal attack 02/28/2013  . Hyperlipidemia 02/28/2013   Madelyn Flavors PT 01/12/2017, 12:48 PM  [facility] [place], [place],  [place] Phone: [phone]   Fax:  [phone]  Name: Carol Cox MRN: [id number] Date of Birth: 08-25-1951

## 2017-01-14 ENCOUNTER — Ambulatory Visit: Payer: BC Managed Care – PPO | Admitting: Physical Therapy

## 2017-01-14 ENCOUNTER — Encounter: Payer: Self-pay | Admitting: Physical Therapy

## 2017-01-14 DIAGNOSIS — M25611 Stiffness of right shoulder, not elsewhere classified: Secondary | ICD-10-CM

## 2017-01-14 DIAGNOSIS — M25511 Pain in right shoulder: Secondary | ICD-10-CM

## 2017-01-14 NOTE — Therapy (Signed)
[facility] [place], [place], [address] Phone: [phone]   Fax:  [phone]  Physical Therapy Treatment  Patient Details  Name: Carol Cox MRN: [id number] Date of Birth: [birth year] Referring Provider: Esmond Plants MD.  Encounter Date: 01/14/2017      PT End of Session - 01/14/17 1350    Visit Number 16   Number of Visits 24   Date for PT Re-Evaluation 01/29/17   PT Start Time 2878   PT Stop Time 1439   PT Time Calculation (min) 50 min   Activity Tolerance Patient tolerated treatment well   Behavior During Therapy [facility] for tasks assessed/performed      Past Medical History:  Diagnosis Date  . Arthritis    "knee, left hip, ankles"   . Borderline glaucoma   . CAD (coronary artery disease)    Mild nonobstructive CAD by cath 02/06/13 (25% mid LAD), normal EF  . Family history of adverse reaction to anesthesia    mother has problems with nausea and vomiting   . History of colitis    02/ 2016  acute infectious colitis-- resolved  . Hyperlipidemia   . Left ovarian cyst   . PONV (postoperative nausea and vomiting)    SEVERE    Past Surgical History:  Procedure Laterality Date  . CARPAL TUNNEL RELEASE Bilateral 1990's  . KNEE ARTHROSCOPY W/ MENISCECTOMY Left 07/2014  . LEFT HEART CATHETERIZATION WITH CORONARY ANGIOGRAM N/A 02/06/2013   Procedure: LEFT HEART CATHETERIZATION WITH CORONARY ANGIOGRAM;  Surgeon: [name] Hampshire, MD;  Location: [facility];  Service: Cardiovascular;  Laterality: N/A;  non-obstruction 25% mLAD,  normal LVF , ef 65-70%  . left knee meniscus surgery     . TOTAL KNEE ARTHROPLASTY Left 03/26/2015   Procedure: LEFT TOTAL KNEE ARTHROPLASTY;  Surgeon: Paralee Cancel, MD;  Location: [facility];  Service: Orthopedics;  Laterality: Left;    There were no vitals filed for this visit.      Subjective Assessment - 01/14/17 1349    Subjective Reports that she really worked her shoulder yesterday  and made it sore.   Patient Stated Goals Use right UE without pain.   Currently in Pain? Yes   Pain Score 3    Pain Location Shoulder   Pain Orientation Right   Pain Descriptors / Indicators Sore   Pain Type Acute pain   Pain Onset More than a month ago            [facility] PT Assessment - 01/14/17 0001      Assessment   Medical Diagnosis Right rotator cuff repair.   Onset Date/Surgical Date 11/19/16   Next MD Visit 01/20/2017     Restrictions   Weight Bearing Restrictions No     ROM / Strength   AROM / PROM / Strength AROM     AROM   Overall AROM  Deficits   AROM Assessment Site Shoulder   Right/Left Shoulder Right   Right Shoulder External Rotation 76 Degrees                     [facility] Adult PT Treatment/Exercise - 01/14/17 0001      Shoulder Exercises: Supine   External Rotation AAROM;Both;20 reps  90/90 deg position   Flexion AAROM;Both;20 reps     Shoulder Exercises: Sidelying   Flexion Strengthening;Right;20 reps     Shoulder Exercises: Pulleys   Flexion Other (comment)  x5 min   Other Pulley Exercises standing  UE ranger flexion I with R arm x 20     Shoulder Exercises: Isometric Strengthening   Flexion Other (comment)   Flexion Limitations 5 sec x10 reps   Extension Other (comment)   Extension Limitations 5 sec x10 reps   External Rotation Other (comment)   External Rotation Limitations 5 sec x10 reps   Internal Rotation Other (comment)  5 sec x10 reps     Modalities   Modalities Electrical Stimulation;Moist Heat     Moist Heat Therapy   Number Minutes Moist Heat 15 Minutes   Moist Heat Location Shoulder     Electrical Stimulation   Electrical Stimulation Location R shoulder   Electrical Stimulation Action IFC   Electrical Stimulation Parameters 1-10 hz x15 min   Electrical Stimulation Goals Pain     Manual Therapy   Manual Therapy Passive ROM   Passive ROM PROM of R shoulder into flex/ER with holds at end range                      PT Long Term Goals - 01/12/17 1242      PT LONG TERM GOAL #1   Title Ind with HEP.   Time 6   Period Weeks   Status On-going     PT LONG TERM GOAL #2   Title Active right shoulder flexion to 150 degrees so the patient can easily reach overhead.   Baseline Met in supine 01/12/17   Time 6   Period Weeks   Status Partially Met     PT LONG TERM GOAL #3   Title Active ER to 70 degrees+ to allow for easily donning/doffing of apparel   Time 6   Period Weeks   Status On-going     PT LONG TERM GOAL #4   Title Increase ROM so patient is able to reach behind back to L3.   Baseline 71 degrees actively 01/12/17   Period Weeks   Status On-going     PT LONG TERM GOAL #5   Title Increase right shoulder strength to a solid 4+/5 to increase stability for performance of functional activities   Time 6   Period Weeks     PT LONG TERM GOAL #6   Title Perform ADL's with pain not > 3/10.   Time 6   Period Weeks               Plan - 01/14/17 1426    Clinical Impression Statement Patient presented in clinic with reports of increased soreness from HEP. Patient able to tolerate isometrics and AAROM exercises fairly well although greatest discomfort noted during supine AAROM ER with shoulder at 90/90 position. Gentle PROM of R shoulder completed into flexion and ER with great improvement noted in PROM into ER. 76 deg AROM ER measured in R shoulder today. Normal modalities response noted following removal of the modalities.   Rehab Potential Excellent   PT Frequency 2x / week   PT Duration 6 weeks   PT Treatment/Interventions ADLs/Self Care Home Management;Cryotherapy;Electrical Stimulation;Patient/family education;Therapeutic exercise;Therapeutic activities;Manual techniques;Passive range of motion;Vasopneumatic Device   PT Next Visit Plan Continue strenghtening; progress isometrics to TB per NO..recommend yellow theraband beginning with RW4 limited ROM.   PT  Home Exercise Plan added cervical retraction and shoulder depression to help with neck pain   Consulted and Agree with Plan of Care Patient      Patient will benefit from skilled therapeutic intervention in order to improve the following deficits and  impairments:  Pain, Decreased activity tolerance, Decreased range of motion  Visit Diagnosis: Stiffness of right shoulder joint  Acute pain of right shoulder     Problem List Patient Active Problem List   Diagnosis Date Noted  . Obese 03/27/2015  . S/P left TKA 03/26/2015  . S/P knee replacement 03/26/2015  . Colitis, acute 12/01/2014  . Weight loss, unintentional 12/01/2014  . Adnexal cyst 12/01/2014  . Elevated fasting glucose 12/01/2014  . CAD (coronary artery disease) 12/01/2014  . Osteoarthritis 12/01/2014  . Vasovagal attack 02/28/2013  . Hyperlipidemia 02/28/2013    Wynelle Fanny, PTA 01/14/2017, 2:42 PM  [facility] [address] [place], [place], [address] Phone: [phone]   Fax:  [phone]  Name: Carol Cox MRN: [id number] Date of Birth: 01/31/1951

## 2017-01-19 ENCOUNTER — Encounter: Payer: Self-pay | Admitting: Physical Therapy

## 2017-01-19 ENCOUNTER — Ambulatory Visit: Payer: BC Managed Care – PPO | Admitting: Physical Therapy

## 2017-01-19 DIAGNOSIS — M25511 Pain in right shoulder: Secondary | ICD-10-CM

## 2017-01-19 DIAGNOSIS — M25611 Stiffness of right shoulder, not elsewhere classified: Secondary | ICD-10-CM

## 2017-01-19 NOTE — Therapy (Addendum)
[facility] [place], [place], [address] Phone: [phone]   Fax:  [phone]  Physical Therapy Treatment  Patient Details  Name: Carol Cox MRN: [id number] Date of Birth: 1951/08/31 Referring Provider: Esmond Plants MD.  Encounter Date: 01/19/2017      PT End of Session - 01/19/17 0949    Visit Number 17   Number of Visits 24   Date for PT Re-Evaluation 01/29/17   PT Start Time 0947   PT Stop Time 1040   PT Time Calculation (min) 53 min   Activity Tolerance Patient tolerated treatment well   Behavior During Therapy [facility] for tasks assessed/performed      Past Medical History:  Diagnosis Date  . Arthritis    "knee, left hip, ankles"   . Borderline glaucoma   . CAD (coronary artery disease)    Mild nonobstructive CAD by cath 02/06/13 (25% mid LAD), normal EF  . Family history of adverse reaction to anesthesia    mother has problems with nausea and vomiting   . History of colitis    02/ 2016  acute infectious colitis-- resolved  . Hyperlipidemia   . Left ovarian cyst   . PONV (postoperative nausea and vomiting)    SEVERE    Past Surgical History:  Procedure Laterality Date  . CARPAL TUNNEL RELEASE Bilateral 1990's  . KNEE ARTHROSCOPY W/ MENISCECTOMY Left 07/2014  . LEFT HEART CATHETERIZATION WITH CORONARY ANGIOGRAM N/A 02/06/2013   Procedure: LEFT HEART CATHETERIZATION WITH CORONARY ANGIOGRAM;  Surgeon: [name] Hampshire, MD;  Location: [facility];  Service: Cardiovascular;  Laterality: N/A;  non-obstruction 25% mLAD,  normal LVF , ef 65-70%  . left knee meniscus surgery     . TOTAL KNEE ARTHROPLASTY Left 03/26/2015   Procedure: LEFT TOTAL KNEE ARTHROPLASTY;  Surgeon: Paralee Cancel, MD;  Location: [facility];  Service: Orthopedics;  Laterality: Left;    There were no vitals filed for this visit.      Subjective Assessment - 01/19/17 0948    Subjective Reports that she has MD appointment tomorrow.   Patient  Stated Goals Use right UE without pain.   Currently in Pain? Other (Comment)  No pain assessment provided            [facility] PT Assessment - 01/19/17 0001      Assessment   Medical Diagnosis Right rotator cuff repair.   Onset Date/Surgical Date 11/19/16   Next MD Visit 01/20/2017     Restrictions   Weight Bearing Restrictions No     ROM / Strength   AROM / PROM / Strength AROM     AROM   Overall AROM  Deficits   AROM Assessment Site Shoulder   Right/Left Shoulder Right   Right Shoulder Flexion 167 Degrees   Right Shoulder Internal Rotation 55 Degrees   Right Shoulder External Rotation 70 Degrees                     [facility] Adult PT Treatment/Exercise - 01/19/17 0001      Shoulder Exercises: Supine   External Rotation AAROM;Both;20 reps   Flexion AAROM;Both;20 reps     Shoulder Exercises: Pulleys   Flexion Other (comment)  x5 min   Other Pulley Exercises standing UE ranger flexion I with R arm x 20   Other Pulley Exercises Wall ladder x15 reps     Shoulder Exercises: Isometric Strengthening   Flexion Other (comment)   Flexion Limitations 5 sec x10  reps   Extension Other (comment)   Extension Limitations 5 sec x10 reps   External Rotation Other (comment)   External Rotation Limitations 5 sec x10 reps   Internal Rotation Other (comment)  5 sec hold x10 reps     Modalities   Modalities Electrical Stimulation;Moist Heat     Moist Heat Therapy   Number Minutes Moist Heat 15 Minutes   Moist Heat Location Shoulder     Electrical Stimulation   Electrical Stimulation Location R shoulder   Electrical Stimulation Action Pre-Mod   Electrical Stimulation Parameters 80-150 hz x15 min   Electrical Stimulation Goals Pain     Manual Therapy   Manual Therapy Passive ROM   Passive ROM PROM of R shoulder into flex/ER with holds at end range                     PT Long Term Goals - 01/12/17 1242      PT LONG TERM GOAL #1   Title Ind with HEP.    Time 6   Period Weeks   Status On-going     PT LONG TERM GOAL #2   Title Active right shoulder flexion to 150 degrees so the patient can easily reach overhead.   Baseline Met in supine 01/12/17   Time 6   Period Weeks   Status Partially Met     PT LONG TERM GOAL #3   Title Active ER to 70 degrees+ to allow for easily donning/doffing of apparel   Time 6   Period Weeks   Status On-going     PT LONG TERM GOAL #4   Title Increase ROM so patient is able to reach behind back to L3.   Baseline 71 degrees actively 01/12/17   Period Weeks   Status On-going     PT LONG TERM GOAL #5   Title Increase right shoulder strength to a solid 4+/5 to increase stability for performance of functional activities   Time 6   Period Weeks     PT LONG TERM GOAL #6   Title Perform ADL's with pain not > 3/10.   Time 6   Period Weeks               Plan - 01/19/17 1029    Clinical Impression Statement Patient tolerated today's treatment well with only minimal facial grimacing during treatment initially with no reports of any increased pain. Patient able to complete AAROM exercises and isometrics well with good techinque. Smooth arc of motion and firm end feels noted with PROM of R shoulder. AROM R shoulder flex 167 deg, ER 70 deg, IR 55 deg. Normal modalities response noted following removal of the modalities.   Rehab Potential Excellent   PT Frequency 2x / week   PT Duration 6 weeks   PT Treatment/Interventions ADLs/Self Care Home Management;Cryotherapy;Electrical Stimulation;Patient/family education;Therapeutic exercise;Therapeutic activities;Manual techniques;Passive range of motion;Vasopneumatic Device   PT Next Visit Plan Continue strenghtening; progress isometrics to TB per NO..recommend yellow theraband beginning with RW4 limited ROM.   PT Home Exercise Plan added cervical retraction and shoulder depression to help with neck pain   Consulted and Agree with Plan of Care Patient       Patient will benefit from skilled therapeutic intervention in order to improve the following deficits and impairments:  Pain, Decreased activity tolerance, Decreased range of motion  Visit Diagnosis: Stiffness of right shoulder joint  Acute pain of right shoulder     Problem List Patient  Active Problem List   Diagnosis Date Noted  . Obese 03/27/2015  . S/P left TKA 03/26/2015  . S/P knee replacement 03/26/2015  . Colitis, acute 12/01/2014  . Weight loss, unintentional 12/01/2014  . Adnexal cyst 12/01/2014  . Elevated fasting glucose 12/01/2014  . CAD (coronary artery disease) 12/01/2014  . Osteoarthritis 12/01/2014  . Vasovagal attack 02/28/2013  . Hyperlipidemia 02/28/2013    Ahmed Prima, PTA 01/19/17 3:45 PM Mali Applegate MPT [facility] [address] [place], [place], [address] Phone: [phone]   Fax:  [phone]  Name: Carol Cox MRN: [id number] Date of Birth: August 21, 1951

## 2017-01-21 ENCOUNTER — Ambulatory Visit: Payer: BC Managed Care – PPO | Admitting: Physical Therapy

## 2017-01-21 ENCOUNTER — Encounter: Payer: Self-pay | Admitting: Physical Therapy

## 2017-01-21 DIAGNOSIS — M25511 Pain in right shoulder: Secondary | ICD-10-CM | POA: Diagnosis not present

## 2017-01-21 DIAGNOSIS — M25611 Stiffness of right shoulder, not elsewhere classified: Secondary | ICD-10-CM

## 2017-01-21 NOTE — Therapy (Signed)
[facility] [place], [place], [address] Phone: [phone]   Fax:  [phone]  Physical Therapy Treatment  Patient Details  Name: Carol Cox MRN: [id number] Date of Birth: 1951/01/02 Referring Provider: Esmond Plants MD.  Encounter Date: 01/21/2017      PT End of Session - 01/21/17 1202    Visit Number 18   Number of Visits 24   Date for PT Re-Evaluation 03/03/17   PT Start Time 1119   PT Stop Time 1215   PT Time Calculation (min) 56 min   Activity Tolerance Patient tolerated treatment well   Behavior During Therapy [facility] for tasks assessed/performed      Past Medical History:  Diagnosis Date  . Arthritis    "knee, left hip, ankles"   . Borderline glaucoma   . CAD (coronary artery disease)    Mild nonobstructive CAD by cath 02/06/13 (25% mid LAD), normal EF  . Family history of adverse reaction to anesthesia    mother has problems with nausea and vomiting   . History of colitis    02/ 2016  acute infectious colitis-- resolved  . Hyperlipidemia   . Left ovarian cyst   . PONV (postoperative nausea and vomiting)    SEVERE    Past Surgical History:  Procedure Laterality Date  . CARPAL TUNNEL RELEASE Bilateral 1990's  . KNEE ARTHROSCOPY W/ MENISCECTOMY Left 07/2014  . LEFT HEART CATHETERIZATION WITH CORONARY ANGIOGRAM N/A 02/06/2013   Procedure: LEFT HEART CATHETERIZATION WITH CORONARY ANGIOGRAM;  Surgeon: [name] Hampshire, MD;  Location: [facility] CATH [facility];  Service: Cardiovascular;  Laterality: N/A;  non-obstruction 25% mLAD,  normal LVF , ef 65-70%  . left knee meniscus surgery     . TOTAL KNEE ARTHROPLASTY Left 03/26/2015   Procedure: LEFT TOTAL KNEE ARTHROPLASTY;  Surgeon: Paralee Cancel, MD;  Location: [facility];  Service: Orthopedics;  Laterality: Left;    There were no vitals filed for this visit.      Subjective Assessment - 01/21/17 1123    Subjective Patient has new order per MD (see media) doing well  overall per patient, went shopping yesterday and had purse on right sholder which made it a little sore.   Patient Stated Goals Use right UE without pain.   Currently in Pain? Yes   Pain Score 3    Pain Location Shoulder   Pain Orientation Right   Pain Descriptors / Indicators Sore   Pain Type Acute pain   Pain Onset More than a month ago   Pain Frequency Intermittent   Aggravating Factors  movement or activity   Pain Relieving Factors at rest                         [facility] Adult PT Treatment/Exercise - 01/21/17 0001      Shoulder Exercises: Standing   Protraction Strengthening;Right;Theraband;20 reps  elbow by side small range   Theraband Level (Shoulder Protraction) Level 1 (Yellow)   External Rotation Strengthening;Right;20 reps;Theraband  elbow at side small range   Theraband Level (Shoulder External Rotation) Level 1 (Yellow)   Internal Rotation Strengthening;Right;20 reps;Theraband  elbow at side small range   Theraband Level (Shoulder Internal Rotation) Level 1 (Yellow)   Row Strengthening;Right;20 reps;Theraband  elbow at side small range   Theraband Level (Shoulder Row) Level 1 (Yellow)     Shoulder Exercises: Pulleys   Flexion Other (comment)  64mn   Other Pulley Exercises UE ranger for elevation  UE support     Moist Heat Therapy   Number Minutes Moist Heat 15 Minutes   Moist Heat Location Shoulder     Electrical Stimulation   Electrical Stimulation Location R shoulder   Electrical Stimulation Action premod   Electrical Stimulation Parameters 80-_0  x86mn     Manual Therapy   Manual Therapy Passive ROM   Passive ROM P/AAROM of R shoulder into flex/ER with holds at end range, rhythmic stab for IR/ER close elbow and Flex/ext at 90 degrees                     PT Long Term Goals - 01/12/17 1242      PT LONG TERM GOAL #1   Title Ind with HEP.   Time 6   Period Weeks   Status On-going     PT LONG TERM GOAL #2   Title  Active right shoulder flexion to 150 degrees so the patient can easily reach overhead.   Baseline Met in supine 01/12/17   Time 6   Period Weeks   Status Partially Met     PT LONG TERM GOAL #3   Title Active ER to 70 degrees+ to allow for easily donning/doffing of apparel   Time 6   Period Weeks   Status On-going     PT LONG TERM GOAL #4   Title Increase ROM so patient is able to reach behind back to L3.   Baseline 71 degrees actively 01/12/17   Period Weeks   Status On-going     PT LONG TERM GOAL #5   Title Increase right shoulder strength to a solid 4+/5 to increase stability for performance of functional activities   Time 6   Period Weeks     PT LONG TERM GOAL #6   Title Perform ADL's with pain not > 3/10.   Time 6   Period Weeks               Plan - 01/21/17 1203    Clinical Impression Statement Patient tolerated treatment well today. Patient able to progress with PRE's per MD order. Patient reported no increased pain today. Patient understands protocol and progression per MD. and MPT orders. Patient progressing and has pull P/AAROM in supine today. Goals ongoing due to healing limitations.    Rehab Potential Excellent   PT Frequency 2x / week   PT Duration 6 weeks   PT Treatment/Interventions ADLs/Self Care Home Management;Cryotherapy;Electrical Stimulation;Patient/family education;Therapeutic exercise;Therapeutic activities;Manual techniques;Passive range of motion;Vasopneumatic Device   PT Next Visit Plan cont with POC per MD. NVeverly Fellsnew order (under media tab) AAROM to full PRE's close elbow theraband, decrease pain, increase ROM ans strengthening, NO ER/ABD / assess progression and issue HEP next treatment for progression due to 1x weekly   Consulted and Agree with Plan of Care Patient      Patient will benefit from skilled therapeutic intervention in order to improve the following deficits and impairments:  Pain, Decreased activity tolerance, Decreased range of  motion  Visit Diagnosis: Stiffness of right shoulder joint  Acute pain of right shoulder     Problem List Patient Active Problem List   Diagnosis Date Noted  . Obese 03/27/2015  . S/P left TKA 03/26/2015  . S/P knee replacement 03/26/2015  . Colitis, acute 12/01/2014  . Weight loss, unintentional 12/01/2014  . Adnexal cyst 12/01/2014  . Elevated fasting glucose 12/01/2014  . CAD (coronary artery disease) 12/01/2014  . Osteoarthritis 12/01/2014  . Vasovagal  attack 02/28/2013  . Hyperlipidemia 02/28/2013    Phillips Climes, PTA 01/21/2017, 12:20 PM  [facility] [place], [place], [address] Phone: [phone]   Fax:  [phone]  Name: LEVAEH VICE MRN: [id number] Date of Birth: December 24, 1950

## 2017-01-26 ENCOUNTER — Encounter: Payer: Self-pay | Admitting: Physical Therapy

## 2017-01-26 ENCOUNTER — Other Ambulatory Visit: Payer: Self-pay | Admitting: Cardiology

## 2017-01-26 ENCOUNTER — Ambulatory Visit: Payer: BC Managed Care – PPO | Attending: Orthopedic Surgery | Admitting: Physical Therapy

## 2017-01-26 DIAGNOSIS — M25611 Stiffness of right shoulder, not elsewhere classified: Secondary | ICD-10-CM | POA: Diagnosis not present

## 2017-01-26 DIAGNOSIS — M25511 Pain in right shoulder: Secondary | ICD-10-CM | POA: Diagnosis present

## 2017-01-26 NOTE — Therapy (Signed)
[facility] [place], [place], [address] Phone: [phone]   Fax:  [phone]  Physical Therapy Treatment  Patient Details  Name: Carol Cox MRN: [id number] Date of Birth: [birth year] Referring Provider: Esmond Plants MD.  Encounter Date: 01/26/2017      PT End of Session - 01/26/17 0947    Visit Number 19   Number of Visits 24   Date for PT Re-Evaluation 03/03/17   PT Start Time 0945   PT Stop Time 1039   PT Time Calculation (min) 54 min   Activity Tolerance Patient tolerated treatment well   Behavior During Therapy [facility] for tasks assessed/performed      Past Medical History:  Diagnosis Date  . Arthritis    "knee, left hip, ankles"   . Borderline glaucoma   . CAD (coronary artery disease)    Mild nonobstructive CAD by cath 02/06/13 (25% mid LAD), normal EF  . Family history of adverse reaction to anesthesia    mother has problems with nausea and vomiting   . History of colitis    02/ 2016  acute infectious colitis-- resolved  . Hyperlipidemia   . Left ovarian cyst   . PONV (postoperative nausea and vomiting)    SEVERE    Past Surgical History:  Procedure Laterality Date  . CARPAL TUNNEL RELEASE Bilateral 1990's  . KNEE ARTHROSCOPY W/ MENISCECTOMY Left 07/2014  . LEFT HEART CATHETERIZATION WITH CORONARY ANGIOGRAM N/A 02/06/2013   Procedure: LEFT HEART CATHETERIZATION WITH CORONARY ANGIOGRAM;  Surgeon: [name] Hampshire, MD;  Location: [facility] [facility];  Service: Cardiovascular;  Laterality: N/A;  non-obstruction 25% mLAD,  normal LVF , ef 65-70%  . left knee meniscus surgery     . TOTAL KNEE ARTHROPLASTY Left 03/26/2015   Procedure: LEFT TOTAL KNEE ARTHROPLASTY;  Surgeon: Paralee Cancel, MD;  Location: [facility];  Service: Orthopedics;  Laterality: Left;    There were no vitals filed for this visit.      Subjective Assessment - 01/26/17 0946    Subjective Reports that she has some soreness along the top of her R  shoulder.   Patient Stated Goals Use right UE without pain.   Currently in Pain? Yes   Pain Score 3    Pain Location Shoulder   Pain Orientation Right;Upper   Pain Descriptors / Indicators Sore   Pain Type Acute pain   Pain Onset More than a month ago            [facility] PT Assessment - 01/26/17 0001      Assessment   Medical Diagnosis Right rotator cuff repair.   Onset Date/Surgical Date 11/19/16   Next MD Visit 03/03/2017     Restrictions   Weight Bearing Restrictions No                     [facility] Adult PT Treatment/Exercise - 01/26/17 0001      Shoulder Exercises: Supine   External Rotation AAROM;Both;20 reps   Flexion AAROM;Both;20 reps     Shoulder Exercises: Standing   Protraction Strengthening;Right;Theraband;20 reps   Theraband Level (Shoulder Protraction) Level 1 (Yellow)   External Rotation Strengthening;Right;20 reps;Theraband   Theraband Level (Shoulder External Rotation) Level 1 (Yellow)   Internal Rotation Strengthening;Right;20 reps;Theraband   Theraband Level (Shoulder Internal Rotation) Level 1 (Yellow)   Extension Strengthening;Right;20 reps;Theraband   Theraband Level (Shoulder Extension) Level 1 (Yellow)   Row Strengthening;Right;20 reps;Theraband   Theraband Level (Shoulder Row) Level 1 (Yellow)  Shoulder Exercises: Pulleys   Flexion Other (comment)  x5 min   Other Pulley Exercises UE ranger into flex/ CW and CCW circles x20 reps each   Other Pulley Exercises Wall slides RUE x20 reps     Modalities   Modalities Electrical Stimulation;Moist Heat     Moist Heat Therapy   Number Minutes Moist Heat 15 Minutes   Moist Heat Location Shoulder     Electrical Stimulation   Electrical Stimulation Location R shoulder   Electrical Stimulation Action Pre-Mod   Electrical Stimulation Parameters 80-150 hz x15 min   Electrical Stimulation Goals Pain     Manual Therapy   Manual Therapy Passive ROM   Passive ROM P/AAROM of R shoulder into  flex/ER with holds at end range                PT Education - 01/26/17 1031    Education provided Yes   Education Details HEP- RW4 with yellow theraband   Person(s) Educated Patient   Methods Explanation;Verbal cues;Handout   Comprehension Verbalized understanding;Verbal cues required             PT Long Term Goals - 01/12/17 1242      PT LONG TERM GOAL #1   Title Ind with HEP.   Time 6   Period Weeks   Status On-going     PT LONG TERM GOAL #2   Title Active right shoulder flexion to 150 degrees so the patient can easily reach overhead.   Baseline Met in supine 01/12/17   Time 6   Period Weeks   Status Partially Met     PT LONG TERM GOAL #3   Title Active ER to 70 degrees+ to allow for easily donning/doffing of apparel   Time 6   Period Weeks   Status On-going     PT LONG TERM GOAL #4   Title Increase ROM so patient is able to reach behind back to L3.   Baseline 71 degrees actively 01/12/17   Period Weeks   Status On-going     PT LONG TERM GOAL #5   Title Increase right shoulder strength to a solid 4+/5 to increase stability for performance of functional activities   Time 6   Period Weeks     PT LONG TERM GOAL #6   Title Perform ADL's with pain not > 3/10.   Time 6   Period Weeks               Plan - 01/26/17 1025    Clinical Impression Statement Patient tolerated today's treatment well with only reports of soreness in the very top of R shoulder. Patient required min assist from LUE to complete full RUE wall slide. Theraband exercises were instructed with VCs to keep elbow close to her side to reduce stress on R shoulder per MD recommendation. Patient educated regarding hitchhiker technique per MD recommendation as well with cane. Patient provided new HEP for RW4 with yellow theraband. Patient educated regarding technique and parameters as well as to start with 2 times daily and progress to 3 times as strength improves. Normal modalities response  noted following removal of the modalities.   Rehab Potential Excellent   PT Frequency 2x / week   PT Duration 6 weeks   PT Treatment/Interventions ADLs/Self Care Home Management;Cryotherapy;Electrical Stimulation;Patient/family education;Therapeutic exercise;Therapeutic activities;Manual techniques;Passive range of motion;Vasopneumatic Device   PT Next Visit Plan cont with POC per MD. Veverly Fells new order (under media tab) AAROM to full PRE's close  elbow theraband, decrease pain, increase ROM ans strengthening, NO ER/ABD / assess progression and issue HEP next treatment for progression due to 1x weekly   PT Home Exercise Plan added cervical retraction and shoulder depression to help with neck pain   Consulted and Agree with Plan of Care Patient      Patient will benefit from skilled therapeutic intervention in order to improve the following deficits and impairments:  Pain, Decreased activity tolerance, Decreased range of motion  Visit Diagnosis: Stiffness of right shoulder joint  Acute pain of right shoulder     Problem List Patient Active Problem List   Diagnosis Date Noted  . Obese 03/27/2015  . S/P left TKA 03/26/2015  . S/P knee replacement 03/26/2015  . Colitis, acute 12/01/2014  . Weight loss, unintentional 12/01/2014  . Adnexal cyst 12/01/2014  . Elevated fasting glucose 12/01/2014  . CAD (coronary artery disease) 12/01/2014  . Osteoarthritis 12/01/2014  . Vasovagal attack 02/28/2013  . Hyperlipidemia 02/28/2013    Wynelle Fanny, PTA 01/26/2017, 10:44 AM  [facility] [address] [place], [place], [address] Phone: [phone]   Fax:  [phone]  Name: Carol Cox MRN: [id number] Date of Birth: 08-23-1951

## 2017-01-26 NOTE — Patient Instructions (Addendum)
Scapular Retraction: Bilateral    Facing anchor, pull arms back, bringing shoulder blades together. KEEP YOUR RIGHT ARM CLOSE TO YOUR SIDE. Repeat _10___ times per set. Do __2__ sets per session. Do __2-3__ sessions per day.  http://orth.exer.us/176   Copyright  VHI. All rights reserved.  Strengthening: Resisted External Rotation    Hold tubing in right hand, elbow at side and forearm across body. Rotate forearm out. KEEP YOUR RIGHT ARM CLOSE TO YOUR SIDE. Repeat _10___ times per set. Do __2__ sets per session. Do __2-3__ sessions per day.  http://orth.exer.us/828   Copyright  VHI. All rights reserved.  Strengthening: Resisted Internal Rotation    Hold tubing in left hand, elbow at side and forearm out. Rotate forearm in across body. KEEP YOUR RIGHT ARM CLOSE TO YOUR SIDE. Repeat __10__ times per set. Do _2___ sets per session. Do _2-3___ sessions per day.  http://orth.exer.us/830   Copyright  VHI. All rights reserved.  Strengthening: Resisted Extension    Hold tubing in right hand, arm forward. Pull arm back, elbow straight. KEEP RIGHT ARM CLOSE TO YOUR SIDE.  Repeat _10___ times per set. Do __2__ sets per session. Do _2-3___ sessions per day.  http://orth.exer.us/832   Copyright  VHI. All rights reserved.

## 2017-02-03 ENCOUNTER — Ambulatory Visit: Payer: BC Managed Care – PPO

## 2017-02-03 DIAGNOSIS — M25611 Stiffness of right shoulder, not elsewhere classified: Secondary | ICD-10-CM | POA: Diagnosis not present

## 2017-02-03 DIAGNOSIS — M25511 Pain in right shoulder: Secondary | ICD-10-CM

## 2017-02-03 NOTE — Therapy (Signed)
[facility] [place], [place], [address] Phone: [phone]   Fax:  [phone]  Physical Therapy Treatment  Patient Details  Name: Carol Cox MRN: [id number] Date of Birth: Jan 21, 1951 Referring Provider: Esmond Plants MD.  Encounter Date: 02/03/2017      PT End of Session - 02/03/17 0951    Visit Number 20   Number of Visits 24   Date for PT Re-Evaluation 03/03/17   PT Start Time 0947   PT Stop Time 1048   PT Time Calculation (min) 61 min   Activity Tolerance Patient tolerated treatment well   Behavior During Therapy [facility] for tasks assessed/performed      Past Medical History:  Diagnosis Date  . Arthritis    "knee, left hip, ankles"   . Borderline glaucoma   . CAD (coronary artery disease)    Mild nonobstructive CAD by cath 02/06/13 (25% mid LAD), normal EF  . Family history of adverse reaction to anesthesia    mother has problems with nausea and vomiting   . History of colitis    02/ 2016  acute infectious colitis-- resolved  . Hyperlipidemia   . Left ovarian cyst   . PONV (postoperative nausea and vomiting)    SEVERE    Past Surgical History:  Procedure Laterality Date  . CARPAL TUNNEL RELEASE Bilateral 1990's  . KNEE ARTHROSCOPY W/ MENISCECTOMY Left 07/2014  . LEFT HEART CATHETERIZATION WITH CORONARY ANGIOGRAM N/A 02/06/2013   Procedure: LEFT HEART CATHETERIZATION WITH CORONARY ANGIOGRAM;  Surgeon: [name] Hampshire, MD;  Location: [facility];  Service: Cardiovascular;  Laterality: N/A;  non-obstruction 25% mLAD,  normal LVF , ef 65-70%  . left knee meniscus surgery     . TOTAL KNEE ARTHROPLASTY Left 03/26/2015   Procedure: LEFT TOTAL KNEE ARTHROPLASTY;  Surgeon: Paralee Cancel, MD;  Location: [facility];  Service: Orthopedics;  Laterality: Left;    There were no vitals filed for this visit.      Subjective Assessment - 02/03/17 0948    Subjective Pt. doing well with no soreness in R shoulder today pain  free.  Pt. noting updated HEP activities, "getting easier".   Patient Stated Goals Use right UE without pain.   Currently in Pain? No/denies   Pain Score 0-No pain   Multiple Pain Sites No            [facility] PT Assessment - 02/03/17 1212      AROM   AROM Assessment Site Shoulder   Right/Left Shoulder Right   Right Shoulder Flexion 168 Degrees   Right Shoulder Internal Rotation 88 Degrees   Right Shoulder External Rotation 75 Degrees     Strength   Overall Strength Comments IR 4-/5, ER 3+/5, flexion 4-/5, abduction NT                     [facility] Adult PT Treatment/Exercise - 02/03/17 0953      Shoulder Exercises: Standing   External Rotation Strengthening;Right;20 reps;Theraband  pain free; cues for scap. retraction    Theraband Level (Shoulder External Rotation) Level 1 (Yellow)   Internal Rotation Strengthening;Right;20 reps;Theraband  pain free   Theraband Level (Shoulder Internal Rotation) Level 1 (Yellow)   Extension Strengthening;Right;20 reps;Theraband   Theraband Level (Shoulder Extension) Level 1 (Yellow)   Row Strengthening;Right;20 reps;Theraband   Theraband Level (Shoulder Row) Level 2 (Red)  red TB issued to pt. for HEP    Other Standing Exercises Standing elevated W B shoulder  ER in scapular plane x 10 reps   Other Standing Exercises Standing B shoulder ER with yellow TB leaning on bolster on wall x 15 reps; emphasis on scap. retraction with ER     Shoulder Exercises: Pulleys   Flexion Other (comment)  5 min      Shoulder Exercises: Therapy Ball   Flexion 15 reps   Flexion Limitations with small ball roll up wall and 2# around wrist; good fatigue reported     Moist Heat Therapy   Number Minutes Moist Heat 15 Minutes   Moist Heat Location Shoulder  R      Electrical Stimulation   Electrical Stimulation Location R shoulder   Electrical Stimulation Action IFC    Electrical Stimulation Parameters 80-150Hz , intensity to pt. tolerance, 15 min     Electrical Stimulation Goals Pain                PT Education - 02/03/17 1201    Education provided Yes   Education Details Supine protraction with soup can,  red TB issued to pt. for row    Person(s) Educated Patient   Methods Explanation;Demonstration;Verbal cues   Comprehension Verbalized understanding;Returned demonstration;Verbal cues required;Need further instruction             PT Long Term Goals - 02/03/17 1001      PT LONG TERM GOAL #1   Title Ind with HEP.   Time 6   Period Weeks   Status Partially Met  4.11.18: met for current      PT LONG TERM GOAL #2   Title Active right shoulder flexion to 150 degrees so the patient can easily reach overhead.   Baseline Met in supine 01/12/17   Time 6   Period Weeks   Status Achieved  4.11.18: R shoulder 168 dg flexion without pain      PT LONG TERM GOAL #3   Title Active ER to 70 degrees+ to allow for easily donning/doffing of apparel   Time 6   Period Weeks   Status Achieved     PT LONG TERM GOAL #4   Title Increase ROM so patient is able to reach behind back to L3.   Baseline 71 degrees actively 01/12/17   Period Weeks   Status Achieved     PT LONG TERM GOAL #5   Title Increase right shoulder strength to a solid 4+/5 to increase stability for performance of functional activities   Time 6   Period Weeks   Status On-going  4.11.18: R shoulder strength flex, IR 4-/5, ER 3+/5, abduction NT     PT LONG TERM GOAL #6   Title Perform ADL's with pain not > 3/10.   Time 6   Period Weeks   Status On-going               Plan - 02/03/17 1200    Clinical Impression Statement Pt. continues to demo ROM improvement in R shoulder AROM flexion 168 dg and AROM ER 75 dg today with only tightness reported.  Pt. not able to meet strength goal in any motion at this time however abduction strength NT today.  Pt. functional IR greatly improved today with report donning/doffing clothing easier now.  Good response  to all strengthening activity today without pain.  HEP updated today to include supine protraction with soup can and red TB issued to pt. for row activity.  Pt. progressing well and will continue to benefit from further skilled therapy to maximize UE  strength, ROM, and return to function.     PT Treatment/Interventions ADLs/Self Care Home Management;Cryotherapy;Electrical Stimulation;Patient/family education;Therapeutic exercise;Therapeutic activities;Manual techniques;Passive range of motion;Vasopneumatic Device   PT Next Visit Plan cont with POC per MD. Veverly Fells new order (under media tab) AAROM to full PRE's close elbow theraband, decrease pain, increase ROM ans strengthening, NO ER/ABD / assess progression and issue HEP next treatment for progression due to 1x weekly      Patient will benefit from skilled therapeutic intervention in order to improve the following deficits and impairments:  Pain, Decreased activity tolerance, Decreased range of motion  Visit Diagnosis: Stiffness of right shoulder joint  Acute pain of right shoulder     Problem List Patient Active Problem List   Diagnosis Date Noted  . Obese 03/27/2015  . S/P left TKA 03/26/2015  . S/P knee replacement 03/26/2015  . Colitis, acute 12/01/2014  . Weight loss, unintentional 12/01/2014  . Adnexal cyst 12/01/2014  . Elevated fasting glucose 12/01/2014  . CAD (coronary artery disease) 12/01/2014  . Osteoarthritis 12/01/2014  . Vasovagal attack 02/28/2013  . Hyperlipidemia 02/28/2013    Bess Harvest, PTA 02/03/17 12:28 PM  [facility] [place], [place], [address] Phone: [phone]   Fax:  [phone]  Name: Carol Cox MRN: [id number] Date of Birth: [birth year]

## 2017-02-09 ENCOUNTER — Ambulatory Visit: Payer: BC Managed Care – PPO | Admitting: Physical Therapy

## 2017-02-09 DIAGNOSIS — M25611 Stiffness of right shoulder, not elsewhere classified: Secondary | ICD-10-CM

## 2017-02-09 DIAGNOSIS — M25511 Pain in right shoulder: Secondary | ICD-10-CM

## 2017-02-09 NOTE — Therapy (Signed)
[facility] [place], [place], [address] Phone: [phone]   Fax:  [phone]  Physical Therapy Treatment  Patient Details  Name: Carol Cox MRN: [id number] Date of Birth: [birth year] Referring Provider: Esmond Plants MD.  Encounter Date: 02/09/2017      PT End of Session - 02/09/17 0910    Visit Number 21   Number of Visits 24   Date for PT Re-Evaluation 03/03/17   PT Start Time 0900   PT Stop Time 0945   PT Time Calculation (min) 45 min   Activity Tolerance Patient tolerated treatment well   Behavior During Therapy [facility] for tasks assessed/performed      Past Medical History:  Diagnosis Date  . Arthritis    "knee, left hip, ankles"   . Borderline glaucoma   . CAD (coronary artery disease)    Mild nonobstructive CAD by cath 02/06/13 (25% mid LAD), normal EF  . Family history of adverse reaction to anesthesia    mother has problems with nausea and vomiting   . History of colitis    02/ 2016  acute infectious colitis-- resolved  . Hyperlipidemia   . Left ovarian cyst   . PONV (postoperative nausea and vomiting)    SEVERE    Past Surgical History:  Procedure Laterality Date  . CARPAL TUNNEL RELEASE Bilateral 1990's  . KNEE ARTHROSCOPY W/ MENISCECTOMY Left 07/2014  . LEFT HEART CATHETERIZATION WITH CORONARY ANGIOGRAM N/A 02/06/2013   Procedure: LEFT HEART CATHETERIZATION WITH CORONARY ANGIOGRAM;  Surgeon: [name] Hampshire, MD;  Location: [facility];  Service: Cardiovascular;  Laterality: N/A;  non-obstruction 25% mLAD,  normal LVF , ef 65-70%  . left knee meniscus surgery     . TOTAL KNEE ARTHROPLASTY Left 03/26/2015   Procedure: LEFT TOTAL KNEE ARTHROPLASTY;  Surgeon: Paralee Cancel, MD;  Location: [facility];  Service: Orthopedics;  Laterality: Left;    There were no vitals filed for this visit.      Subjective Assessment - 02/09/17 1001    Subjective Pt reporting pain over the weekend and needed to rest  and not being able to perform her HEP. Pt reporting no pain today upon arrival to clinic.    Patient Stated Goals Use right UE without pain.   Currently in Pain? No/denies                         [facility] Adult PT Treatment/Exercise - 02/09/17 0001      Shoulder Exercises: Standing   Protraction Strengthening;20 reps;Theraband   Theraband Level (Shoulder Protraction) Level 2 (Red)   External Rotation Strengthening;Right;20 reps;Theraband  pain free; cues for scap. retraction    Theraband Level (Shoulder External Rotation) Level 2 (Red)   Internal Rotation Strengthening;Right;20 reps;Theraband  pain free   Theraband Level (Shoulder Internal Rotation) Level 2 (Red)   Extension Strengthening;Right;20 reps;Theraband   Theraband Level (Shoulder Extension) Level 2 (Red)   Row Strengthening;20 reps;Theraband   Theraband Level (Shoulder Row) Level 2 (Red)   Other Standing Exercises wall PNF patterns D1 and D2 using a 2# ball      Shoulder Exercises: Pulleys   Flexion Other (comment)  5 min    Other Pulley Exercises UE ranger, circles counter clock wise and clock wise, flexion  all standing     Shoulder Exercises: Therapy Ball   Flexion 15 reps   Flexion Limitations with small ball roll up wall and 2# around wrist; good fatigue reported  Modalities   Modalities Cryotherapy;Electrical Stimulation     Moist Heat Therapy   Number Minutes Moist Heat 15 Minutes   Moist Heat Location Shoulder     Electrical Stimulation   Electrical Stimulation Location r shoulder   Electrical Stimulation Action IFC   Electrical Stimulation Parameters 80-150 Hz, intensity to pt';s tolerance   Electrical Stimulation Goals Pain                     PT Long Term Goals - 02/09/17 0931      PT LONG TERM GOAL #1   Title Ind with HEP.   Status Partially Met     PT LONG TERM GOAL #2   Title Active right shoulder flexion to 150 degrees so the patient can easily reach overhead.    Baseline Met in supine 01/12/17   Time 6   Period Weeks   Status Achieved     PT LONG TERM GOAL #3   Title Active ER to 70 degrees+ to allow for easily donning/doffing of apparel   Time 6   Period Weeks   Status Achieved     PT LONG TERM GOAL #4   Title Increase ROM so patient is able to reach behind back to L3.   Baseline 71 degrees actively 01/12/17   Time 6   Period Weeks   Status Achieved     PT LONG TERM GOAL #5   Title Increase right shoulder strength to a solid 4+/5 to increase stability for performance of functional activities   Time 6   Status On-going     PT LONG TERM GOAL #6   Title Perform ADL's with pain not > 3/10.   Period Weeks   Status Achieved               Plan - 02/09/17 0912    Clinical Impression Statement Patient continues to improve witih her R UE shoulder AROM. Pt with 3 visits left of PT. Pt reporting she has a follow up with her MD on the 03/03/17. Pt still progressing with strength. Pt instrucuted in Diagonal PNF patterns and towel pulls. Pt tolerated well. Continue with skilled PT to progress pt toward PLOF.    Rehab Potential Excellent   PT Frequency 2x / week   PT Duration 6 weeks   PT Treatment/Interventions ADLs/Self Care Home Management;Cryotherapy;Electrical Stimulation;Patient/family education;Therapeutic exercise;Therapeutic activities;Manual techniques;Passive range of motion;Vasopneumatic Device   PT Next Visit Plan cont with POC per MD. Veverly Fells new order (under media tab) AAROM to full PRE's close elbow theraband, decrease pain, increase ROM ans strengthening, NO ER/ABD / assess progression and issue HEP next treatment for progression due to 1x weekly   PT Home Exercise Plan added cervical retraction and shoulder depression to help with neck pain, added diagonals D1 and D2, towel pulls for IR, sidelying stretch for IR.       Patient will benefit from skilled therapeutic intervention in order to improve the following deficits and  impairments:  Pain, Decreased activity tolerance, Decreased range of motion  Visit Diagnosis: Stiffness of right shoulder joint  Acute pain of right shoulder     Problem List Patient Active Problem List   Diagnosis Date Noted  . Obese 03/27/2015  . S/P left TKA 03/26/2015  . S/P knee replacement 03/26/2015  . Colitis, acute 12/01/2014  . Weight loss, unintentional 12/01/2014  . Adnexal cyst 12/01/2014  . Elevated fasting glucose 12/01/2014  . CAD (coronary artery disease) 12/01/2014  . Osteoarthritis  12/01/2014  . Vasovagal attack 02/28/2013  . Hyperlipidemia 02/28/2013    Oretha Caprice, MPT  02/09/2017, 10:03 AM  [facility] [address] [place], [place], [address] Phone: [phone]   Fax:  [phone]  Name: Carol Cox MRN: [id number] Date of Birth: 1950-11-16

## 2017-02-16 ENCOUNTER — Ambulatory Visit: Payer: BC Managed Care – PPO | Admitting: *Deleted

## 2017-02-16 DIAGNOSIS — M25611 Stiffness of right shoulder, not elsewhere classified: Secondary | ICD-10-CM

## 2017-02-16 DIAGNOSIS — M25511 Pain in right shoulder: Secondary | ICD-10-CM

## 2017-02-16 NOTE — Therapy (Signed)
[facility] [place], [place], [address] Phone: [phone]   Fax:  [phone]  Physical Therapy Treatment  Patient Details  Name: Carol Cox MRN: [id number] Date of Birth: 09-13-1951 Referring Provider: Esmond Plants MD.  Encounter Date: 02/16/2017      PT End of Session - 02/16/17 1116    Visit Number 22   Number of Visits 24   Date for PT Re-Evaluation 03/03/17   PT Start Time 0945   PT Stop Time 2202   PT Time Calculation (min) 59 min      Past Medical History:  Diagnosis Date  . Arthritis    "knee, left hip, ankles"   . Borderline glaucoma   . CAD (coronary artery disease)    Mild nonobstructive CAD by cath 02/06/13 (25% mid LAD), normal EF  . Family history of adverse reaction to anesthesia    mother has problems with nausea and vomiting   . History of colitis    02/ 2016  acute infectious colitis-- resolved  . Hyperlipidemia   . Left ovarian cyst   . PONV (postoperative nausea and vomiting)    SEVERE    Past Surgical History:  Procedure Laterality Date  . CARPAL TUNNEL RELEASE Bilateral 1990's  . KNEE ARTHROSCOPY W/ MENISCECTOMY Left 07/2014  . LEFT HEART CATHETERIZATION WITH CORONARY ANGIOGRAM N/A 02/06/2013   Procedure: LEFT HEART CATHETERIZATION WITH CORONARY ANGIOGRAM;  Surgeon: [name] Hampshire, MD;  Location: [facility];  Service: Cardiovascular;  Laterality: N/A;  non-obstruction 25% mLAD,  normal LVF , ef 65-70%  . left knee meniscus surgery     . TOTAL KNEE ARTHROPLASTY Left 03/26/2015   Procedure: LEFT TOTAL KNEE ARTHROPLASTY;  Surgeon: Paralee Cancel, MD;  Location: [facility];  Service: Orthopedics;  Laterality: Left;    There were no vitals filed for this visit.      Subjective Assessment - 02/16/17 1017    Subjective RT shldr still gets a sharp pain at times   Patient Stated Goals Use right UE without pain.   Currently in Pain? No/denies                          [facility] Adult PT Treatment/Exercise - 02/16/17 0001      Shoulder Exercises: Standing   Protraction Strengthening;20 reps;Theraband   Theraband Level (Shoulder Protraction) Level 2 (Red)   External Rotation Strengthening;Right;20 reps;Theraband  pain free; cues for scap. retraction    Theraband Level (Shoulder External Rotation) Level 1 (Yellow)   Internal Rotation Strengthening;Right;20 reps;Theraband  pain free   Theraband Level (Shoulder Internal Rotation) Level 2 (Red)   Extension Strengthening;Right;20 reps;Theraband   Theraband Level (Shoulder Extension) Level 2 (Red)   Row Strengthening;20 reps;Theraband   Theraband Level (Shoulder Row) Level 2 (Red)   Other Standing Exercises wall PNF patterns D1 and D2 3x10     Shoulder Exercises: Pulleys   Flexion Other (comment)  5 min    Other Pulley Exercises UE ranger, circles counter clock wise and clock wise, flexion  all standing     Modalities   Modalities Cryotherapy;Electrical Stimulation     Moist Heat Therapy   Number Minutes Moist Heat 15 Minutes   Moist Heat Location Shoulder     Electrical Stimulation   Electrical Stimulation Location r shoulder  IFC x 15 mins   Electrical Stimulation Parameters 80-_0    Electrical Stimulation Goals Pain     Manual Therapy   Manual  Therapy Passive ROM   Passive ROM P/AAROM of R shoulder into flex/ER with holds at end range                     PT Long Term Goals - 02/09/17 0931      PT LONG TERM GOAL #1   Title Ind with HEP.   Status Partially Met     PT LONG TERM GOAL #2   Title Active right shoulder flexion to 150 degrees so the patient can easily reach overhead.   Baseline Met in supine 01/12/17   Time 6   Period Weeks   Status Achieved     PT LONG TERM GOAL #3   Title Active ER to 70 degrees+ to allow for easily donning/doffing of apparel   Time 6   Period Weeks   Status Achieved     PT LONG TERM GOAL #4   Title Increase ROM so patient is able to  reach behind back to L3.   Baseline 71 degrees actively 01/12/17   Time 6   Period Weeks   Status Achieved     PT LONG TERM GOAL #5   Title Increase right shoulder strength to a solid 4+/5 to increase stability for performance of functional activities   Time 6   Status On-going     PT LONG TERM GOAL #6   Title Perform ADL's with pain not > 3/10.   Period Weeks   Status Achieved               Plan - 02/16/17 1117    Clinical Impression Statement Pt arrived to clinic today  doing fairly well with low pain levels in RT shldr. She was able perform all therex AROM, AAROM, and Tband strengthening with minimal pain increase and mainly fatigue. We backed down to yellow tband for ER due to weakness and technique.   She has 2 visits left and may join Gym program if MD releases her. She is still challenged with standing flexion and has pain at times. still    Rehab Potential Excellent   PT Frequency 2x / week   PT Duration 6 weeks   PT Treatment/Interventions ADLs/Self Care Home Management;Cryotherapy;Electrical Stimulation;Patient/family education;Therapeutic exercise;Therapeutic activities;Manual techniques;Passive range of motion;Vasopneumatic Device   PT Next Visit Plan cont with POC per MD. Veverly Fells new order (under media tab) AAROM to full PRE's close elbow theraband, decrease pain, increase ROM ans strengthening, NO ER/ABD / assess progression and issue HEP next treatment for progression due to 1x weekly   PT Home Exercise Plan added cervical retraction and shoulder depression to help with neck pain, added diagonals D1 and D2, towel pulls for IR, sidelying stretch for IR.       Patient will benefit from skilled therapeutic intervention in order to improve the following deficits and impairments:  Pain, Decreased activity tolerance, Decreased range of motion  Visit Diagnosis: Stiffness of right shoulder joint  Acute pain of right shoulder     Problem List Patient Active Problem  List   Diagnosis Date Noted  . Obese 03/27/2015  . S/P left TKA 03/26/2015  . S/P knee replacement 03/26/2015  . Colitis, acute 12/01/2014  . Weight loss, unintentional 12/01/2014  . Adnexal cyst 12/01/2014  . Elevated fasting glucose 12/01/2014  . CAD (coronary artery disease) 12/01/2014  . Osteoarthritis 12/01/2014  . Vasovagal attack 02/28/2013  . Hyperlipidemia 02/28/2013    Lewayne Pauley,CHRIS, PTA 02/16/2017, 11:33 AM  [facility] [address]  [place], [place], [address] Phone: [phone]   Fax:  [phone]  Name: DEONNA KRUMMEL MRN: [id number] Date of Birth: [birth year]

## 2017-02-23 ENCOUNTER — Ambulatory Visit: Payer: Medicare Other | Attending: Orthopedic Surgery | Admitting: *Deleted

## 2017-02-23 DIAGNOSIS — M25511 Pain in right shoulder: Secondary | ICD-10-CM | POA: Insufficient documentation

## 2017-02-23 DIAGNOSIS — M25611 Stiffness of right shoulder, not elsewhere classified: Secondary | ICD-10-CM | POA: Diagnosis not present

## 2017-02-23 NOTE — Therapy (Signed)
[facility] [place], [place], [address] Phone: [phone]   Fax:  [phone]  Physical Therapy Treatment  Patient Details  Name: Carol Cox MRN: [id number] Date of Birth: [birth year] Referring Provider: Esmond Plants MD.  Encounter Date: 02/23/2017      PT End of Session - 02/23/17 0950    Visit Number 23   Number of Visits 24   Date for PT Re-Evaluation 03/03/17   PT Start Time 0948   PT Stop Time 1048   PT Time Calculation (min) 60 min      Past Medical History:  Diagnosis Date  . Arthritis    "knee, left hip, ankles"   . Borderline glaucoma   . CAD (coronary artery disease)    Mild nonobstructive CAD by cath 02/06/13 (25% mid LAD), normal EF  . Family history of adverse reaction to anesthesia    mother has problems with nausea and vomiting   . History of colitis    02/ 2016  acute infectious colitis-- resolved  . Hyperlipidemia   . Left ovarian cyst   . PONV (postoperative nausea and vomiting)    SEVERE    Past Surgical History:  Procedure Laterality Date  . CARPAL TUNNEL RELEASE Bilateral 1990's  . KNEE ARTHROSCOPY W/ MENISCECTOMY Left 07/2014  . LEFT HEART CATHETERIZATION WITH CORONARY ANGIOGRAM N/A 02/06/2013   Procedure: LEFT HEART CATHETERIZATION WITH CORONARY ANGIOGRAM;  Surgeon: [name] Hampshire, MD;  Location: [facility];  Service: Cardiovascular;  Laterality: N/A;  non-obstruction 25% mLAD,  normal LVF , ef 65-70%  . left knee meniscus surgery     . TOTAL KNEE ARTHROPLASTY Left 03/26/2015   Procedure: LEFT TOTAL KNEE ARTHROPLASTY;  Surgeon: Paralee Cancel, MD;  Location: [facility];  Service: Orthopedics;  Laterality: Left;    There were no vitals filed for this visit.      Subjective Assessment - 02/23/17 0949    Subjective RT shldr still gets a sharp pain at times. Elbow has been getting sore   Patient Stated Goals Use right UE without pain.   Currently in Pain? No/denies                          [facility] Adult PT Treatment/Exercise - 02/23/17 0001      Shoulder Exercises: Standing   Protraction Strengthening;20 reps;Theraband  2x fatigue   Theraband Level (Shoulder Protraction) Level 2 (Red)   External Rotation Strengthening;Right;20 reps;Theraband  2x fatigue   Theraband Level (Shoulder External Rotation) Level 1 (Yellow)   Internal Rotation Strengthening;Right;20 reps;Theraband  2x fatigue   Theraband Level (Shoulder Internal Rotation) Level 2 (Red)   Extension Strengthening;Right;20 reps;Theraband  2x fatigue   Theraband Level (Shoulder Extension) Level 2 (Red)   Row Strengthening;20 reps;Theraband;Right  2x fatigue   Theraband Level (Shoulder Row) Level 2 (Red)   Other Standing Exercises wall PNF patterns D1 and D2 3x10     Shoulder Exercises: Pulleys   Flexion Other (comment)  5 min    Other Pulley Exercises UE ranger, circles counter clock wise and clock wise, flexion  all standing x 5 mins     Modalities   Modalities Electrical Stimulation;Moist Heat     Moist Heat Therapy   Number Minutes Moist Heat 15 Minutes   Moist Heat Location Shoulder     Electrical Stimulation   Electrical Stimulation Location r shoulder  IFC x 15 mins   80-150hz     Electrical Stimulation  Goals Pain     Manual Therapy   Manual Therapy Passive ROM   Passive ROM P/AAROM of R shoulder into all motions with holds at end range. Rhythmic stabs into flexion                     PT Long Term Goals - 02/09/17 0931      PT LONG TERM GOAL #1   Title Ind with HEP.   Status Partially Met     PT LONG TERM GOAL #2   Title Active right shoulder flexion to 150 degrees so the patient can easily reach overhead.   Baseline Met in supine 01/12/17   Time 6   Period Weeks   Status Achieved     PT LONG TERM GOAL #3   Title Active ER to 70 degrees+ to allow for easily donning/doffing of apparel   Time 6   Period Weeks   Status Achieved     PT  LONG TERM GOAL #4   Title Increase ROM so patient is able to reach behind back to L3.   Baseline 71 degrees actively 01/12/17   Time 6   Period Weeks   Status Achieved     PT LONG TERM GOAL #5   Title Increase right shoulder strength to a solid 4+/5 to increase stability for performance of functional activities   Time 6   Status On-going     PT LONG TERM GOAL #6   Title Perform ADL's with pain not > 3/10.   Period Weeks   Status Achieved               Plan - 02/23/17 3267    Clinical Impression Statement Pt arrived today doing fairly well with minimal RT shldr pain, but did have som RT elbow soreness. She was able to perform RW4 tband exs for strengthening and all AROM exs with mainly fatigue. Her ROM continues to be good and needs to keep working on strengthening as per MD orders.   Rehab Potential Excellent   PT Frequency 2x / week   PT Duration 6 weeks   PT Treatment/Interventions ADLs/Self Care Home Management;Cryotherapy;Electrical Stimulation;Patient/family education;Therapeutic exercise;Therapeutic activities;Manual techniques;Passive range of motion;Vasopneumatic Device   PT Next Visit Plan cont with POC per MD. Veverly Fells  01-20-17 new order (under media tab) AAROM to full PRE's close elbow theraband, decrease pain, increase ROM ans strengthening, NO ER/ABD / assess progression and issue HEP next treatment for progression due to 1x weekly   PT Home Exercise Plan added cervical retraction and shoulder depression to help with neck pain, added diagonals D1 and D2, towel pulls for IR, sidelying stretch for IR.    Consulted and Agree with Plan of Care Patient      Patient will benefit from skilled therapeutic intervention in order to improve the following deficits and impairments:  Pain, Decreased activity tolerance, Decreased range of motion  Visit Diagnosis: Stiffness of right shoulder joint  Acute pain of right shoulder     Problem List Patient Active Problem List    Diagnosis Date Noted  . Obese 03/27/2015  . S/P left TKA 03/26/2015  . S/P knee replacement 03/26/2015  . Colitis, acute 12/01/2014  . Weight loss, unintentional 12/01/2014  . Adnexal cyst 12/01/2014  . Elevated fasting glucose 12/01/2014  . CAD (coronary artery disease) 12/01/2014  . Osteoarthritis 12/01/2014  . Vasovagal attack 02/28/2013  . Hyperlipidemia 02/28/2013    Troye Hiemstra,CHRIS, PTA 02/23/2017, 10:48 AM  [facility] [place], [place], [address] Phone: [phone]   Fax:  [phone]  Name: Carol Cox MRN: [id number] Date of Birth: June 15, 1951

## 2017-03-02 ENCOUNTER — Ambulatory Visit: Payer: Medicare Other | Admitting: *Deleted

## 2017-03-02 DIAGNOSIS — M25511 Pain in right shoulder: Secondary | ICD-10-CM

## 2017-03-02 DIAGNOSIS — M25611 Stiffness of right shoulder, not elsewhere classified: Secondary | ICD-10-CM | POA: Diagnosis not present

## 2017-03-02 NOTE — Therapy (Signed)
[facility] [place], [place], [address] Phone: [phone]   Fax:  [phone]  Physical Therapy Treatment  Patient Details  Name: Carol Cox MRN: [id number] Date of Birth: Jan 08, 1951 Referring Provider: Esmond Plants MD.  Encounter Date: 03/02/2017    Past Medical History:  Diagnosis Date  . Arthritis    "knee, left hip, ankles"   . Borderline glaucoma   . CAD (coronary artery disease)    Mild nonobstructive CAD by cath 02/06/13 (25% mid LAD), normal EF  . Family history of adverse reaction to anesthesia    mother has problems with nausea and vomiting   . History of colitis    02/ 2016  acute infectious colitis-- resolved  . Hyperlipidemia   . Left ovarian cyst   . PONV (postoperative nausea and vomiting)    SEVERE    Past Surgical History:  Procedure Laterality Date  . CARPAL TUNNEL RELEASE Bilateral 1990's  . KNEE ARTHROSCOPY W/ MENISCECTOMY Left 07/2014  . LEFT HEART CATHETERIZATION WITH CORONARY ANGIOGRAM N/A 02/06/2013   Procedure: LEFT HEART CATHETERIZATION WITH CORONARY ANGIOGRAM;  Surgeon: [name] Hampshire, MD;  Location: [facility];  Service: Cardiovascular;  Laterality: N/A;  non-obstruction 25% mLAD,  normal LVF , ef 65-70%  . left knee meniscus surgery     . TOTAL KNEE ARTHROPLASTY Left 03/26/2015   Procedure: LEFT TOTAL KNEE ARTHROPLASTY;  Surgeon: Paralee Cancel, MD;  Location: [facility];  Service: Orthopedics;  Laterality: Left;    There were no vitals filed for this visit.      Subjective Assessment - 03/02/17 1313    Subjective RT shldr still gets a sharp pain at times. Elbow has been getting sore. Did a lot in the yard this weekend and it is sore   Patient Stated Goals Use right UE without pain.   Currently in Pain? Yes   Pain Score 3    Pain Location Shoulder   Pain Orientation Right;Upper   Pain Descriptors / Indicators Sore   Pain Type Acute pain   Pain Onset More than a month ago                          [facility] Adult PT Treatment/Exercise - 03/02/17 0001      Shoulder Exercises: Sidelying   External Rotation AROM;10 reps;20 reps;Right   Flexion AROM;Right;10 reps;20 reps     Shoulder Exercises: Standing   Protraction Strengthening;20 reps;Theraband   Theraband Level (Shoulder Protraction) Level 2 (Red)   External Rotation Strengthening;Right;20 reps;Theraband   Theraband Level (Shoulder External Rotation) Level 2 (Red)   Internal Rotation Strengthening;Right;20 reps;Theraband   Theraband Level (Shoulder Internal Rotation) Level 2 (Red)   Extension Strengthening;Right;20 reps;Theraband   Theraband Level (Shoulder Extension) Level 2 (Red)   Row Strengthening;20 reps;Theraband;Right   Theraband Level (Shoulder Row) Level 2 (Red)   Other Standing Exercises wall PNF patterns D1 and D2 3x10     Shoulder Exercises: Pulleys   Flexion Other (comment)  5 min    Other Pulley Exercises UE ranger, circles counter clock wise and clock wise, flexion  all standing x 5 mins     Modalities   Modalities Electrical Stimulation;Moist Heat     Electrical Stimulation   Electrical Stimulation Location r shoulder  IFC x 15 mins   80-150hz     Electrical Stimulation Goals Pain     Vasopneumatic   Number Minutes Vasopneumatic  15 minutes   Vasopnuematic Location  Shoulder   Vasopneumatic Pressure Medium   Vasopneumatic Temperature  3 snowflakes     Manual Therapy   Manual Therapy Passive ROM   Passive ROM P/AAROM of R shoulder into all motions with holds at end range. Rhythmic stabs into flexion                     PT Long Term Goals - 03/02/17 1316      PT LONG TERM GOAL #1   Title Ind with HEP.   Time 6   Period Weeks   Status Partially Met     PT LONG TERM GOAL #2   Title Active right shoulder flexion to 150 degrees so the patient can easily reach overhead.   Baseline Met in supine 01/12/17   Time 6   Period Weeks   Status Achieved      PT LONG TERM GOAL #3   Title Active ER to 70 degrees+ to allow for easily donning/doffing of apparel   Time 6   Period Weeks   Status Achieved     PT LONG TERM GOAL #4   Title Increase ROM so patient is able to reach behind back to L3.   Baseline 71 degrees actively 01/12/17   Time 6   Period Weeks   Status Achieved     PT LONG TERM GOAL #5   Title Increase right shoulder strength to a solid 4+/5 to increase stability for performance of functional activities   Time 6   Period Weeks   Status Partially Met     PT LONG TERM GOAL #6   Title Perform ADL's with pain not > 3/10.   Time 6   Period Weeks   Status Partially Met  4/10 after yard work               Plan - 03/02/17 1319    Clinical Impression Statement Pt arrived today having increased RT shldr soreness due to working in the yard this weekend and feels she may have overdone it. She was able to perform AROM and light strengthening RW4  Exs with mainly soreness and fatigue. Her ROM for flexion was 160 degrees and Er to 75 degrees. Strength was 4/5 grossly.   Rehab Potential Excellent   PT Frequency 2x / week   PT Duration 6 weeks   PT Treatment/Interventions ADLs/Self Care Home Management;Cryotherapy;Electrical Stimulation;Patient/family education;Therapeutic exercise;Therapeutic activities;Manual techniques;Passive range of motion;Vasopneumatic Device   PT Next Visit Plan cont with POC per MD. Veverly Fells  01-20-17 new order (under media tab) AAROM to full PRE's close elbow theraband, decrease pain, increase ROM ans strengthening, NO ER/ABD / assess progression and issue HEP next treatment for progression due to 1x weekly       MD note today   PT Home Exercise Plan added cervical retraction and shoulder depression to help with neck pain, added diagonals D1 and D2, towel pulls for IR, sidelying stretch for IR.    Consulted and Agree with Plan of Care Patient      Patient will benefit from skilled therapeutic intervention  in order to improve the following deficits and impairments:  Pain, Decreased activity tolerance, Decreased range of motion  Visit Diagnosis: Stiffness of right shoulder joint  Acute pain of right shoulder     Problem List Patient Active Problem List   Diagnosis Date Noted  . Obese 03/27/2015  . S/P left TKA 03/26/2015  . S/P knee replacement 03/26/2015  . Colitis, acute 12/01/2014  .  Weight loss, unintentional 12/01/2014  . Adnexal cyst 12/01/2014  . Elevated fasting glucose 12/01/2014  . CAD (coronary artery disease) 12/01/2014  . Osteoarthritis 12/01/2014  . Vasovagal attack 02/28/2013  . Hyperlipidemia 02/28/2013    Neyland Pettengill,CHRIS,PTA 03/02/2017, 2:31 PM  [facility] [address] [place], [place], [address] Phone: [phone]   Fax:  [phone]  Name: AUNYA LEMLER MRN: [id number] Date of Birth: 01/15/1951

## 2017-03-09 ENCOUNTER — Ambulatory Visit: Payer: Medicare Other | Admitting: *Deleted

## 2017-03-09 DIAGNOSIS — M25611 Stiffness of right shoulder, not elsewhere classified: Secondary | ICD-10-CM | POA: Diagnosis not present

## 2017-03-09 DIAGNOSIS — M25511 Pain in right shoulder: Secondary | ICD-10-CM

## 2017-03-09 NOTE — Therapy (Signed)
[facility] [place], [place], [address] Phone: [phone]   Fax:  [phone]  Physical Therapy Treatment  Patient Details  Name: Carol Cox MRN: [id number] Date of Birth: 29-Sep-1951 Referring Provider: Esmond Plants MD.  Encounter Date: 03/09/2017      PT End of Session - 03/09/17 1046    Visit Number 25   Number of Visits 30   Date for PT Re-Evaluation 04/14/17  New Order 1/wk x 6wks   PT Start Time 0945   PT Stop Time 1048   PT Time Calculation (min) 63 min   Activity Tolerance Patient tolerated treatment well   Behavior During Therapy [facility] for tasks assessed/performed      Past Medical History:  Diagnosis Date  . Arthritis    "knee, left hip, ankles"   . Borderline glaucoma   . CAD (coronary artery disease)    Mild nonobstructive CAD by cath 02/06/13 (25% mid LAD), normal EF  . Family history of adverse reaction to anesthesia    mother has problems with nausea and vomiting   . History of colitis    02/ 2016  acute infectious colitis-- resolved  . Hyperlipidemia   . Left ovarian cyst   . PONV (postoperative nausea and vomiting)    SEVERE    Past Surgical History:  Procedure Laterality Date  . CARPAL TUNNEL RELEASE Bilateral 1990's  . KNEE ARTHROSCOPY W/ MENISCECTOMY Left 07/2014  . LEFT HEART CATHETERIZATION WITH CORONARY ANGIOGRAM N/A 02/06/2013   Procedure: LEFT HEART CATHETERIZATION WITH CORONARY ANGIOGRAM;  Surgeon: [name] Hampshire, MD;  Location: [facility];  Service: Cardiovascular;  Laterality: N/A;  non-obstruction 25% mLAD,  normal LVF , ef 65-70%  . left knee meniscus surgery     . TOTAL KNEE ARTHROPLASTY Left 03/26/2015   Procedure: LEFT TOTAL KNEE ARTHROPLASTY;  Surgeon: Paralee Cancel, MD;  Location: [facility];  Service: Orthopedics;  Laterality: Left;    There were no vitals filed for this visit.                       [facility] Adult PT Treatment/Exercise - 03/09/17 0001       Elbow Exercises   Elbow Flexion Strengthening;Right;Standing     Shoulder Exercises: Sidelying   External Rotation AROM;10 reps;20 reps;Right   Flexion AROM;Right;10 reps;20 reps     Shoulder Exercises: Standing   Flexion AROM;Right;10 reps;20 reps  to 90 degrees to reduce shldr hike    ABduction Right;20 reps   Extension Strengthening;Right;20 reps;Theraband   Theraband Level (Shoulder Extension) Level 2 (Red)   Row Strengthening;20 reps;Theraband;Right   Theraband Level (Shoulder Row) Level 2 (Red)   Other Standing Exercises wall PNF patterns D1 and D2 3x10     Shoulder Exercises: Pulleys   Flexion Other (comment)  5 min    Other Pulley Exercises UE ranger, circles counter clock wise and clock wise, flexion  all standing x 5 mins     Modalities   Modalities Electrical Stimulation;Moist Heat     Electrical Stimulation   Electrical Stimulation Location r shoulder  IFC x 15 mins   80-150hz     Electrical Stimulation Goals Pain     Vasopneumatic   Number Minutes Vasopneumatic  15 minutes   Vasopnuematic Location  Shoulder   Vasopneumatic Pressure Medium   Vasopneumatic Temperature  3 snowflakes                     PT  Long Term Goals - 03/02/17 1316      PT LONG TERM GOAL #1   Title Ind with HEP.   Time 6   Period Weeks   Status Partially Met     PT LONG TERM GOAL #2   Title Active right shoulder flexion to 150 degrees so the patient can easily reach overhead.   Baseline Met in supine 01/12/17   Time 6   Period Weeks   Status Achieved     PT LONG TERM GOAL #3   Title Active ER to 70 degrees+ to allow for easily donning/doffing of apparel   Time 6   Period Weeks   Status Achieved     PT LONG TERM GOAL #4   Title Increase ROM so patient is able to reach behind back to L3.   Baseline 71 degrees actively 01/12/17   Time 6   Period Weeks   Status Achieved     PT LONG TERM GOAL #5   Title Increase right shoulder strength to a solid 4+/5 to  increase stability for performance of functional activities   Time 6   Period Weeks   Status Partially Met     PT LONG TERM GOAL #6   Title Perform ADL's with pain not > 3/10.   Time 6   Period Weeks   Status Partially Met  4/10 after yard work               Plan - 03/09/17 1051    Clinical Impression Statement Pt arrived to clinic with N.O. to continue with RT shldr strengthening and to work on Higher education careers adviser.  She did very well with standing flexion today using mirror for visual feedback and to decrease shldr hiking. We also started AROM for abduction, but only to about 45 degrees due  to compensation.   Rehab Potential Excellent   PT Frequency 2x / week   PT Duration 6 weeks   PT Treatment/Interventions ADLs/Self Care Home Management;Cryotherapy;Electrical Stimulation;Patient/family education;Therapeutic exercise;Therapeutic activities;Manual techniques;Passive range of motion;Vasopneumatic Device   PT Next Visit Plan New Order for 1x/wk for 6 weeks, See in Media. Watch mechanics, periscapular strengthening   PT Home Exercise Plan added cervical retraction and shoulder depression to help with neck pain, added diagonals D1 and D2, towel pulls for IR, sidelying stretch for IR.    Consulted and Agree with Plan of Care Patient      Patient will benefit from skilled therapeutic intervention in order to improve the following deficits and impairments:  Pain, Decreased activity tolerance, Decreased range of motion  Visit Diagnosis: Stiffness of right shoulder joint  Acute pain of right shoulder     Problem List Patient Active Problem List   Diagnosis Date Noted  . Obese 03/27/2015  . S/P left TKA 03/26/2015  . S/P knee replacement 03/26/2015  . Colitis, acute 12/01/2014  . Weight loss, unintentional 12/01/2014  . Adnexal cyst 12/01/2014  . Elevated fasting glucose 12/01/2014  . CAD (coronary artery disease) 12/01/2014  . Osteoarthritis 12/01/2014  . Vasovagal  attack 02/28/2013  . Hyperlipidemia 02/28/2013    [name],CHRIS, PTA 03/09/2017, 11:04 AM  [facility] [place], [place], [address] Phone: [phone]   Fax:  [phone]  Name: Carol Cox MRN: [id number] Date of Birth: July 06, 1951

## 2017-03-16 ENCOUNTER — Ambulatory Visit: Payer: Medicare Other | Admitting: *Deleted

## 2017-03-16 DIAGNOSIS — M25511 Pain in right shoulder: Secondary | ICD-10-CM

## 2017-03-16 DIAGNOSIS — M25611 Stiffness of right shoulder, not elsewhere classified: Secondary | ICD-10-CM

## 2017-03-16 NOTE — Therapy (Signed)
[facility] [place], [place], [address] Phone: [phone]   Fax:  [phone]  Physical Therapy Treatment  Patient Details  Name: Carol Cox MRN: [id number] Date of Birth: 13-Apr-1951 Referring Provider: Esmond Plants MD.  Encounter Date: 03/16/2017      PT End of Session - 03/16/17 1057    Visit Number 26   Number of Visits 30   Date for PT Re-Evaluation 04/14/17   PT Start Time 0945   PT Stop Time 1046   PT Time Calculation (min) 61 min      Past Medical History:  Diagnosis Date  . Arthritis    "knee, left hip, ankles"   . Borderline glaucoma   . CAD (coronary artery disease)    Mild nonobstructive CAD by cath 02/06/13 (25% mid LAD), normal EF  . Family history of adverse reaction to anesthesia    mother has problems with nausea and vomiting   . History of colitis    02/ 2016  acute infectious colitis-- resolved  . Hyperlipidemia   . Left ovarian cyst   . PONV (postoperative nausea and vomiting)    SEVERE    Past Surgical History:  Procedure Laterality Date  . CARPAL TUNNEL RELEASE Bilateral 1990's  . KNEE ARTHROSCOPY W/ MENISCECTOMY Left 07/2014  . LEFT HEART CATHETERIZATION WITH CORONARY ANGIOGRAM N/A 02/06/2013   Procedure: LEFT HEART CATHETERIZATION WITH CORONARY ANGIOGRAM;  Surgeon: [name] Hampshire, MD;  Location: [place] [facility];  Service: Cardiovascular;  Laterality: N/A;  non-obstruction 25% mLAD,  normal LVF , ef 65-70%  . left knee meniscus surgery     . TOTAL KNEE ARTHROPLASTY Left 03/26/2015   Procedure: LEFT TOTAL KNEE ARTHROPLASTY;  Surgeon: Paralee Cancel, MD;  Location: [facility];  Service: Orthopedics;  Laterality: Left;    There were no vitals filed for this visit.      Subjective Assessment - 03/16/17 1001    Subjective RT shldr is mainly sore from working outside.   Patient Stated Goals Use right UE without pain.   Currently in Pain? Yes   Pain Score 2    Pain Location Shoulder   Pain Orientation Right   Pain Descriptors / Indicators Sore   Pain Type Acute pain   Pain Onset More than a month ago                         [facility] Adult PT Treatment/Exercise - 03/16/17 0001      Elbow Exercises   Elbow Flexion Strengthening;Right;Standing  3# x30     Shoulder Exercises: Prone   Extension Strengthening;Right  3# x 30   Horizontal ABduction 1 AROM;Right;10 reps;20 reps   Other Prone Exercises ROWS 3# x30     Shoulder Exercises: Sidelying   External Rotation AROM;10 reps;20 reps;Right  1#     Shoulder Exercises: Standing   Flexion AROM;Right;10 reps;20 reps  to 90 degrees to reduce shldr hike    ABduction AROM;Right;10 reps;20 reps   Other Standing Exercises wall PNF patterns D1 and D2 3x10 2# ball   Other Standing Exercises XTS pink  ,   Rows  x30     Shoulder Exercises: Pulleys   Flexion Other (comment)  5 min    Other Pulley Exercises UE ranger, circles counter clock wise and clock wise, flexion  all standing x 3 mins     Modalities   Modalities Electrical Stimulation;Moist Heat     Electrical Stimulation  Electrical Stimulation Location r shoulder  IFC x 15 mins   80-150hz     Electrical Stimulation Goals Pain     Vasopneumatic   Number Minutes Vasopneumatic  15 minutes   Vasopnuematic Location  Shoulder   Vasopneumatic Pressure Medium   Vasopneumatic Temperature  3 snowflakes                     PT Long Term Goals - 03/02/17 1316      PT LONG TERM GOAL #1   Title Ind with HEP.   Time 6   Period Weeks   Status Partially Met     PT LONG TERM GOAL #2   Title Active right shoulder flexion to 150 degrees so the patient can easily reach overhead.   Baseline Met in supine 01/12/17   Time 6   Period Weeks   Status Achieved     PT LONG TERM GOAL #3   Title Active ER to 70 degrees+ to allow for easily donning/doffing of apparel   Time 6   Period Weeks   Status Achieved     PT LONG TERM GOAL #4   Title  Increase ROM so patient is able to reach behind back to L3.   Baseline 71 degrees actively 01/12/17   Time 6   Period Weeks   Status Achieved     PT LONG TERM GOAL #5   Title Increase right shoulder strength to a solid 4+/5 to increase stability for performance of functional activities   Time 6   Period Weeks   Status Partially Met     PT LONG TERM GOAL #6   Title Perform ADL's with pain not > 3/10.   Time 6   Period Weeks   Status Partially Met  4/10 after yard work               Plan - 03/16/17 1006    Clinical Impression Statement Pt arrived to clinic today with soreness in RT shldr from working in nthe yard, but was able to complete all therex with mainly fatigue. Decreased shldr hike with standing elevation.   Rehab Potential Excellent   PT Frequency 2x / week   PT Duration 6 weeks   PT Treatment/Interventions ADLs/Self Care Home Management;Cryotherapy;Electrical Stimulation;Patient/family education;Therapeutic exercise;Therapeutic activities;Manual techniques;Passive range of motion;Vasopneumatic Device   PT Next Visit Plan New Order for 1x/wk for 6 weeks, See in Media. Watch mechanics, periscapular strengthening   PT Home Exercise Plan added cervical retraction and shoulder depression to help with neck pain, added diagonals D1 and D2, towel pulls for IR, sidelying stretch for IR.    Consulted and Agree with Plan of Care Patient      Patient will benefit from skilled therapeutic intervention in order to improve the following deficits and impairments:  Pain, Decreased activity tolerance, Decreased range of motion  Visit Diagnosis: Stiffness of right shoulder joint  Acute pain of right shoulder     Problem List Patient Active Problem List   Diagnosis Date Noted  . Obese 03/27/2015  . S/P left TKA 03/26/2015  . S/P knee replacement 03/26/2015  . Colitis, acute 12/01/2014  . Weight loss, unintentional 12/01/2014  . Adnexal cyst 12/01/2014  . Elevated  fasting glucose 12/01/2014  . CAD (coronary artery disease) 12/01/2014  . Osteoarthritis 12/01/2014  . Vasovagal attack 02/28/2013  . Hyperlipidemia 02/28/2013    RAMSEUR,CHRIS, PTA 03/16/2017, 10:58 AM  [facility] [address] [place], [place], [address]  Phone: [phone]   Fax:  [phone]  Name: Carol Cox MRN: [id number] Date of Birth: [birth year]

## 2017-03-23 ENCOUNTER — Ambulatory Visit: Payer: Medicare Other | Admitting: *Deleted

## 2017-03-23 DIAGNOSIS — M25611 Stiffness of right shoulder, not elsewhere classified: Secondary | ICD-10-CM | POA: Diagnosis not present

## 2017-03-23 DIAGNOSIS — M25511 Pain in right shoulder: Secondary | ICD-10-CM

## 2017-03-23 NOTE — Therapy (Signed)
[facility] [place], [place], [address] Phone: [phone]   Fax:  [phone]  Physical Therapy Treatment  Patient Details  Name: Carol Cox MRN: [id number] Date of Birth: Sep 18, 1951 Referring Provider: Esmond Plants MD.  Encounter Date: 03/23/2017      PT End of Session - 03/23/17 0954    Visit Number 27   Number of Visits 30   Date for PT Re-Evaluation 04/14/17   PT Start Time 0945   PT Stop Time 1583   PT Time Calculation (min) 60 min      Past Medical History:  Diagnosis Date  . Arthritis    "knee, left hip, ankles"   . Borderline glaucoma   . CAD (coronary artery disease)    Mild nonobstructive CAD by cath 02/06/13 (25% mid LAD), normal EF  . Family history of adverse reaction to anesthesia    mother has problems with nausea and vomiting   . History of colitis    02/ 2016  acute infectious colitis-- resolved  . Hyperlipidemia   . Left ovarian cyst   . PONV (postoperative nausea and vomiting)    SEVERE    Past Surgical History:  Procedure Laterality Date  . CARPAL TUNNEL RELEASE Bilateral 1990's  . KNEE ARTHROSCOPY W/ MENISCECTOMY Left 07/2014  . LEFT HEART CATHETERIZATION WITH CORONARY ANGIOGRAM N/A 02/06/2013   Procedure: LEFT HEART CATHETERIZATION WITH CORONARY ANGIOGRAM;  Surgeon: [name] Hampshire, MD;  Location: [place] [facility]ATH [facility];  Service: Cardiovascular;  Laterality: N/A;  non-obstruction 25% mLAD,  normal LVF , ef 65-70%  . left knee meniscus surgery     . TOTAL KNEE ARTHROPLASTY Left 03/26/2015   Procedure: LEFT TOTAL KNEE ARTHROPLASTY;  Surgeon: Paralee Cancel, MD;  Location: [facility];  Service: Orthopedics;  Laterality: Left;    There were no vitals filed for this visit.      Subjective Assessment - 03/23/17 0952    Subjective RT shldr feels good today   Patient Stated Goals Use right UE without pain.   Currently in Pain? Yes   Pain Score 2    Pain Location Shoulder   Pain Orientation Right    Pain Descriptors / Indicators Sore   Pain Type Acute pain   Pain Onset More than a month ago                          [facility] Adult PT Treatment/Exercise - 03/23/17 0001      Elbow Exercises   Elbow Flexion Strengthening;Right;Standing  3# x30     Shoulder Exercises: Prone   Extension Strengthening;Right  3# x 30   Horizontal ABduction 1 AROM;Right;10 reps;20 reps   Other Prone Exercises ROWS 3# x30     Shoulder Exercises: Sidelying   External Rotation Strengthening;Right  1# 3x10-15   Flexion Strengthening;Right  1# 3x10-15     Shoulder Exercises: Standing   Flexion AROM;Right;10 reps;20 reps  to 90 degrees to reduce shldr hike . Better today   ABduction AROM;Right;10 reps;20 reps   Extension --   Theraband Level (Shoulder Extension) --   Row --   Theraband Level (Shoulder Row) --   Other Standing Exercises wall PNF patterns D1 and D2 3x10 2# ball   Other Standing Exercises XTS pink  ,   Rows  x30, lat pull down 5x10     Shoulder Exercises: Pulleys   Flexion Other (comment)  5 min    Other Pulley Exercises UE ranger,  circles counter clock wise and clock wise, flexion  all standing x 3 mins     Modalities   Modalities Electrical Stimulation;Moist Heat     Electrical Stimulation   Electrical Stimulation Goals --  unavailable     Vasopneumatic   Number Minutes Vasopneumatic  15 minutes   Vasopnuematic Location  Shoulder   Vasopneumatic Pressure Medium   Vasopneumatic Temperature  3 snowflakes     Manual Therapy   Manual Therapy --  unavailable                     PT Long Term Goals - 03/02/17 1316      PT LONG TERM GOAL #1   Title Ind with HEP.   Time 6   Period Weeks   Status Partially Met     PT LONG TERM GOAL #2   Title Active right shoulder flexion to 150 degrees so the patient can easily reach overhead.   Baseline Met in supine 01/12/17   Time 6   Period Weeks   Status Achieved     PT LONG TERM GOAL #3   Title  Active ER to 70 degrees+ to allow for easily donning/doffing of apparel   Time 6   Period Weeks   Status Achieved     PT LONG TERM GOAL #4   Title Increase ROM so patient is able to reach behind back to L3.   Baseline 71 degrees actively 01/12/17   Time 6   Period Weeks   Status Achieved     PT LONG TERM GOAL #5   Title Increase right shoulder strength to a solid 4+/5 to increase stability for performance of functional activities   Time 6   Period Weeks   Status Partially Met     PT LONG TERM GOAL #6   Title Perform ADL's with pain not > 3/10.   Time 6   Period Weeks   Status Partially Met  4/10 after yard work               Plan - 03/23/17 0954    Clinical Impression Statement Pt arrived today doing better with less pain in RT shldr. She was able to raise RT UE today without shldr hiking. She did well with added PREs and had mainly faigue at the end of Rx.   Rehab Potential Excellent   PT Frequency 2x / week   PT Duration 6 weeks   PT Treatment/Interventions ADLs/Self Care Home Management;Cryotherapy;Electrical Stimulation;Patient/family education;Therapeutic exercise;Therapeutic activities;Manual techniques;Passive range of motion;Vasopneumatic Device   PT Next Visit Plan New Order for 1x/wk for 6 weeks, See in Media. Watch mechanics, periscapular strengthening   Consulted and Agree with Plan of Care Patient      Patient will benefit from skilled therapeutic intervention in order to improve the following deficits and impairments:  Pain, Decreased activity tolerance, Decreased range of motion  Visit Diagnosis: Stiffness of right shoulder joint  Acute pain of right shoulder     Problem List Patient Active Problem List   Diagnosis Date Noted  . Obese 03/27/2015  . S/P left TKA 03/26/2015  . S/P knee replacement 03/26/2015  . Colitis, acute 12/01/2014  . Weight loss, unintentional 12/01/2014  . Adnexal cyst 12/01/2014  . Elevated fasting glucose 12/01/2014   . CAD (coronary artery disease) 12/01/2014  . Osteoarthritis 12/01/2014  . Vasovagal attack 02/28/2013  . Hyperlipidemia 02/28/2013    Alivya Wegman,CHRIS, PTA 03/23/2017, 11:58 AM  [facility]  [place], [place], [address] Phone: [phone]   Fax:  [phone]  Name: Carol Cox MRN: [id number] Date of Birth: Mar 18, 1951

## 2017-03-30 ENCOUNTER — Ambulatory Visit: Payer: BC Managed Care – PPO | Attending: Orthopedic Surgery | Admitting: Physical Therapy

## 2017-03-30 DIAGNOSIS — M25511 Pain in right shoulder: Secondary | ICD-10-CM | POA: Insufficient documentation

## 2017-03-30 DIAGNOSIS — M25611 Stiffness of right shoulder, not elsewhere classified: Secondary | ICD-10-CM

## 2017-03-30 NOTE — Therapy (Signed)
[facility] [place], [place], [address] Phone: [phone]   Fax:  [phone]  Physical Therapy Treatment  Patient Details  Name: Carol Cox MRN: [id number] Date of Birth: June 26, 1951 Referring Provider: Esmond Plants MD.  Encounter Date: 03/30/2017      PT End of Session - 03/30/17 1317    Visit Number 28   Number of Visits 30   Date for PT Re-Evaluation 04/14/17   PT Start Time 0945   PT Stop Time 1033   PT Time Calculation (min) 48 min   Activity Tolerance Patient tolerated treatment well   Behavior During Therapy [facility] for tasks assessed/performed      Past Medical History:  Diagnosis Date  . Arthritis    "knee, left hip, ankles"   . Borderline glaucoma   . CAD (coronary artery disease)    Mild nonobstructive CAD by cath 02/06/13 (25% mid LAD), normal EF  . Family history of adverse reaction to anesthesia    mother has problems with nausea and vomiting   . History of colitis    02/ 2016  acute infectious colitis-- resolved  . Hyperlipidemia   . Left ovarian cyst   . PONV (postoperative nausea and vomiting)    SEVERE    Past Surgical History:  Procedure Laterality Date  . CARPAL TUNNEL RELEASE Bilateral 1990's  . KNEE ARTHROSCOPY W/ MENISCECTOMY Left 07/2014  . LEFT HEART CATHETERIZATION WITH CORONARY ANGIOGRAM N/A 02/06/2013   Procedure: LEFT HEART CATHETERIZATION WITH CORONARY ANGIOGRAM;  Surgeon: [name] Hampshire, MD;  Location: [place] [facility];  Service: Cardiovascular;  Laterality: N/A;  non-obstruction 25% mLAD,  normal LVF , ef 65-70%  . left knee meniscus surgery     . TOTAL KNEE ARTHROPLASTY Left 03/26/2015   Procedure: LEFT TOTAL KNEE ARTHROPLASTY;  Surgeon: Paralee Cancel, MD;  Location: [facility];  Service: Orthopedics;  Laterality: Left;    There were no vitals filed for this visit.      Subjective Assessment - 03/30/17 1308    Subjective I'm doing good.   Pain Score 2    Pain Location  Shoulder   Pain Orientation Right                         [facility] Adult PT Treatment/Exercise - 03/30/17 0001      Shoulder Exercises: Pulleys   Other Pulley Exercises 5 minutes.   Other Pulley Exercises UE Ranger x 5 minutes and wall ladder x 5 minutes.     Acupuncturist Location Right shoulder.   Electrical Stimulation Action IFC   Electrical Stimulation Parameters 80-150 Hz x 15 minutes.   Electrical Stimulation Goals Pain     Manual Therapy   Manual Therapy Passive ROM   Manual therapy comments 10 minutes gentle endrange right shoulder PAROM.                     PT Long Term Goals - 03/02/17 1316      PT LONG TERM GOAL #1   Title Ind with HEP.   Time 6   Period Weeks   Status Partially Met     PT LONG TERM GOAL #2   Title Active right shoulder flexion to 150 degrees so the patient can easily reach overhead.   Baseline Met in supine 01/12/17   Time 6   Period Weeks   Status Achieved     PT LONG TERM  GOAL #3   Title Active ER to 70 degrees+ to allow for easily donning/doffing of apparel   Time 6   Period Weeks   Status Achieved     PT LONG TERM GOAL #4   Title Increase ROM so patient is able to reach behind back to L3.   Baseline 71 degrees actively 01/12/17   Time 6   Period Weeks   Status Achieved     PT LONG TERM GOAL #5   Title Increase right shoulder strength to a solid 4+/5 to increase stability for performance of functional activities   Time 6   Period Weeks   Status Partially Met     PT LONG TERM GOAL #6   Title Perform ADL's with pain not > 3/10.   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      Patient will benefit from skilled therapeutic intervention in order to improve the following deficits and impairments:  Pain, Decreased activity tolerance, Decreased  range of motion  Visit Diagnosis: Stiffness of right shoulder joint  Acute pain of right shoulder     Problem List Patient Active Problem List   Diagnosis Date Noted  . Obese 03/27/2015  . S/P left TKA 03/26/2015  . S/P knee replacement 03/26/2015  . Colitis, acute 12/01/2014  . Weight loss, unintentional 12/01/2014  . Adnexal cyst 12/01/2014  . Elevated fasting glucose 12/01/2014  . CAD (coronary artery disease) 12/01/2014  . Osteoarthritis 12/01/2014  . Vasovagal attack 02/28/2013  . Hyperlipidemia 02/28/2013    Yacqub Baston, [place] MPT 03/30/2017, 1:20 PM  [facility] [address] [place], [place], [address] Phone: [phone]   Fax:  [phone]  Name: Carol Cox MRN: [id number] Date of Birth: 12/25/1950

## 2017-04-06 ENCOUNTER — Ambulatory Visit: Payer: BC Managed Care – PPO | Admitting: Physical Therapy

## 2017-04-06 ENCOUNTER — Encounter: Payer: Self-pay | Admitting: Physical Therapy

## 2017-04-06 DIAGNOSIS — M25611 Stiffness of right shoulder, not elsewhere classified: Secondary | ICD-10-CM

## 2017-04-06 DIAGNOSIS — M25511 Pain in right shoulder: Secondary | ICD-10-CM

## 2017-04-06 NOTE — Therapy (Signed)
[facility] [place], [place], [address] Phone: [phone]   Fax:  [phone]  Physical Therapy Treatment  Patient Details  Name: Carol Cox MRN: [id number] Date of Birth: 1951-04-10 Referring Provider: Esmond Plants MD.  Encounter Date: 04/06/2017      PT End of Session - 04/06/17 1518    Visit Number 29   Number of Visits 30   Date for PT Re-Evaluation 04/14/17   PT Start Time 3532   PT Stop Time 1602   PT Time Calculation (min) 45 min   Activity Tolerance Patient tolerated treatment well   Behavior During Therapy [facility] for tasks assessed/performed      Past Medical History:  Diagnosis Date  . Arthritis    "knee, left hip, ankles"   . Borderline glaucoma   . CAD (coronary artery disease)    Mild nonobstructive CAD by cath 02/06/13 (25% mid LAD), normal EF  . Family history of adverse reaction to anesthesia    mother has problems with nausea and vomiting   . History of colitis    02/ 2016  acute infectious colitis-- resolved  . Hyperlipidemia   . Left ovarian cyst   . PONV (postoperative nausea and vomiting)    SEVERE    Past Surgical History:  Procedure Laterality Date  . CARPAL TUNNEL RELEASE Bilateral 1990's  . KNEE ARTHROSCOPY W/ MENISCECTOMY Left 07/2014  . LEFT HEART CATHETERIZATION WITH CORONARY ANGIOGRAM N/A 02/06/2013   Procedure: LEFT HEART CATHETERIZATION WITH CORONARY ANGIOGRAM;  Surgeon: [name] Hampshire, MD;  Location: [place] CATH [facility];  Service: Cardiovascular;  Laterality: N/A;  non-obstruction 25% mLAD,  normal LVF , ef 65-70%  . left knee meniscus surgery     . TOTAL KNEE ARTHROPLASTY Left 03/26/2015   Procedure: LEFT TOTAL KNEE ARTHROPLASTY;  Surgeon: Paralee Cancel, MD;  [facility];  Service: Orthopedics;  Laterality: Left;    There were no vitals filed for this visit.      Subjective Assessment - 04/06/17 1517    Subjective Reports that R shoulder is doing good but not completely  better. Reports she has trouble with abduction. Reports she can tell she can work for longer periods of time. Reports she has been doing yardwork and has to stop after a while.   Patient Stated Goals Use right UE without pain.   Currently in Pain? No/denies            [facility] PT Assessment - 04/06/17 0001      Assessment   Medical Diagnosis Right rotator cuff repair.   Onset Date/Surgical Date 11/19/16   Next MD Visit 04/14/2017     Restrictions   Weight Bearing Restrictions No                     [facility] Adult PT Treatment/Exercise - 04/06/17 0001      Bed Mobility   Bed Mobility --     Exercises   Exercises Shoulder;Elbow     Elbow Exercises   Elbow Flexion Strengthening;Right;Standing;Other reps (comment);Bar weights/barbell   Bar Weights/Barbell (Elbow Flexion) 3 lbs   Elbow Flexion Limitations 3x10 reps     Shoulder Exercises: Prone   Retraction Strengthening;Right;Weights   Retraction Weight (lbs) 3   Retraction Limitations 3x10 reps   Extension Strengthening;Right;Weights  3x10 reps   Extension Weight (lbs) 3     Shoulder Exercises: Standing   Protraction Strengthening;Right;Theraband   Theraband Level (Shoulder Protraction) Level 2 (Red)   Protraction  Limitations 2x20 reps   External Rotation Strengthening;Right;Theraband  2x20 reps   Theraband Level (Shoulder External Rotation) Level 2 (Red)   Internal Rotation Strengthening;Right;Theraband   Theraband Level (Shoulder Internal Rotation) Level 2 (Red)   Internal Rotation Limitations 2x20 reps   Extension Strengthening;Right;Theraband   Theraband Level (Shoulder Extension) Level 2 (Red)   Extension Limitations 2x20 reps   Row Strengthening;Right;Theraband   Theraband Level (Shoulder Row) Level 2 (Red)   Row Limitations 2x20 reps   Other Standing Exercises D1/D2/Oh lift 2# 3x10 reps each     Shoulder Exercises: Pulleys   Flexion Other (comment)  x5 min     Shoulder Exercises:  ROM/Strengthening   Other ROM/Strengthening Exercises Wall slides x20 reps   Other ROM/Strengthening Exercises .     Modalities   Modalities Passenger transport manager Location R shoulder   Electrical Stimulation Action Pre-Mod   Electrical Stimulation Parameters 80-150 hz x15 min   Electrical Stimulation Goals Pain     Vasopneumatic   Number Minutes Vasopneumatic  15 minutes   Vasopnuematic Location  Shoulder   Vasopneumatic Pressure Low   Vasopneumatic Temperature  34                     PT Long Term Goals - 03/02/17 1316      PT LONG TERM GOAL #1   Title Ind with HEP.   Time 6   Period Weeks   Status Partially Met     PT LONG TERM GOAL #2   Title Active right shoulder flexion to 150 degrees so the patient can easily reach overhead.   Baseline Met in supine 01/12/17   Time 6   Period Weeks   Status Achieved     PT LONG TERM GOAL #3   Title Active ER to 70 degrees+ to allow for easily donning/doffing of apparel   Time 6   Period Weeks   Status Achieved     PT LONG TERM GOAL #4   Title Increase ROM so patient is able to reach behind back to L3.   Baseline 71 degrees actively 01/12/17   Time 6   Period Weeks   Status Achieved     PT LONG TERM GOAL #5   Title Increase right shoulder strength to a solid 4+/5 to increase stability for performance of functional activities   Time 6   Period Weeks   Status Partially Met     PT LONG TERM GOAL #6   Title Perform ADL's with pain not > 3/10.   Time 6   Period Weeks   Status Partially Met  4/10 after yard work               Plan - 04/06/17 1549    Clinical Impression Statement Patient tolerated today's treatment well with no reports of R shoulder pain upon arrival. Fatigue presented with resisted exercises especially PNF pattern exercises. Good technique with exercises and no reports of any pain with resisted exercises. Normal  modalities response noted following removal of the modalities.   Rehab Potential Excellent   PT Frequency 2x / week   PT Duration 6 weeks   PT Treatment/Interventions ADLs/Self Care Home Management;Cryotherapy;Electrical Stimulation;Patient/family education;Therapeutic exercise;Therapeutic activities;Manual techniques;Passive range of motion;Vasopneumatic Device   PT Next Visit Plan New Order for 1x/wk for 6 weeks, See in Media. Watch mechanics, periscapular strengthening   PT Home Exercise Plan added cervical retraction and shoulder depression to  help with neck pain, added diagonals D1 and D2, towel pulls for IR, sidelying stretch for IR.    Consulted and Agree with Plan of Care Patient      Patient will benefit from skilled therapeutic intervention in order to improve the following deficits and impairments:  Pain, Decreased activity tolerance, Decreased range of motion  Visit Diagnosis: Stiffness of right shoulder joint  Acute pain of right shoulder     Problem List Patient Active Problem List   Diagnosis Date Noted  . Obese 03/27/2015  . S/P left TKA 03/26/2015  . S/P knee replacement 03/26/2015  . Colitis, acute 12/01/2014  . Weight loss, unintentional 12/01/2014  . Adnexal cyst 12/01/2014  . Elevated fasting glucose 12/01/2014  . CAD (coronary artery disease) 12/01/2014  . Osteoarthritis 12/01/2014  . Vasovagal attack 02/28/2013  . Hyperlipidemia 02/28/2013    Wynelle Fanny, PTA 04/06/2017, 4:06 PM  [facility] [address] [place], [place], [address] Phone: [phone]   Fax:  [phone]  Name: Carol Cox MRN: [id number] Date of Birth: Feb 20, 1951

## 2017-04-13 ENCOUNTER — Ambulatory Visit: Payer: BC Managed Care – PPO | Admitting: Physical Therapy

## 2017-04-13 DIAGNOSIS — M25611 Stiffness of right shoulder, not elsewhere classified: Secondary | ICD-10-CM

## 2017-04-13 NOTE — Therapy (Addendum)
[facility] [address] [place], [place], [address] Phone: [phone]   Fax:  [phone]  Physical Therapy Treatment  Patient Details  Name: Carol Cox MRN: [id number] Date of Birth: 03/31/1951 Referring Provider: Steven Norris MD.  Encounter Date: 04/13/2017      PT End of Session - 04/13/17 0951    Visit Number 30   Number of Visits 30   Date for PT Re-Evaluation 04/14/17   PT Start Time 0945   PT Stop Time 1028   PT Time Calculation (min) 43 min   Activity Tolerance Patient tolerated treatment well   Behavior During Therapy WFL for tasks assessed/performed      Past Medical History:  Diagnosis Date  . Arthritis    "knee, left hip, ankles"   . Borderline glaucoma   . CAD (coronary artery disease)    Mild nonobstructive CAD by cath 02/06/13 (25% mid LAD), normal EF  . Family history of adverse reaction to anesthesia    mother has problems with nausea and vomiting   . History of colitis    02/ 2016  acute infectious colitis-- resolved  . Hyperlipidemia   . Left ovarian cyst   . PONV (postoperative nausea and vomiting)    SEVERE    Past Surgical History:  Procedure Laterality Date  . CARPAL TUNNEL RELEASE Bilateral 1990's  . KNEE ARTHROSCOPY W/ MENISCECTOMY Left 07/2014  . LEFT HEART CATHETERIZATION WITH CORONARY ANGIOGRAM N/A 02/06/2013   Procedure: LEFT HEART CATHETERIZATION WITH CORONARY ANGIOGRAM;  Surgeon: Muhammad A Arida, MD;  Location: [facility] CATH [facility];  Service: Cardiovascular;  Laterality: N/A;  non-obstruction 25% mLAD,  normal LVF , ef 65-70%  . left knee meniscus surgery     . TOTAL KNEE ARTHROPLASTY Left 03/26/2015   Procedure: LEFT TOTAL KNEE ARTHROPLASTY;  Surgeon: Matthew Olin, MD;  Location: [facility];  Service: Orthopedics;  Laterality: Left;    There were no vitals filed for this visit.      Subjective Assessment - 04/13/17 0952    Subjective Patient reports her R shoulder is doing pretty good.  She's probably 75-80-% improved. It still hurts her at night and with overuse. Her main complaint is of tiredness/weakness and difficulty with horizontal ABD. She also reports intermittent pain in her biceps.   Patient Stated Goals Use right UE without pain.   Currently in Pain? No/denies  soreness            [facility] PT Assessment - 04/13/17 0001      AROM   AROM Assessment Site Shoulder   Right/Left Shoulder Right   Right Shoulder Flexion 118 Degrees  standing   Right Shoulder External Rotation 90 Degrees   Right Shoulder Horizontal ABduction --  full     PROM   PROM Assessment Site Shoulder   Right/Left Shoulder Right   Right Shoulder Flexion 155 Degrees  standing     Strength   Overall Strength Comments Flex/ext 4+/5, ABD 4/5, IR/ER 5-/5                     [facility] Adult PT Treatment/Exercise - 04/13/17 0001      Shoulder Exercises: Supine   Horizontal ABduction Strengthening;Right;20 reps;Weights  1   Horizontal ABduction Weight (lbs) 1     Shoulder Exercises: Seated   Other Seated Exercises elbow flexion 4# x 20 elbow on mat table     Shoulder Exercises: Prone   Extension Strengthening;Right;10 reps;20 reps;Weights   Extension Weight (  lbs) 4   Horizontal ABduction 1 AROM;Right;10 reps;20 reps;Weights   Horizontal ABduction 1 Weight (lbs) 1   Other Prone Exercises ROWS 3# x30     Shoulder Exercises: Standing   Flexion Strengthening;Right;Weights;20 reps   Shoulder Flexion Weight (lbs) 2   Flexion Limitations to top shelf of cupboard   ABduction Strengthening;Right;20 reps;Weights   Shoulder ABduction Weight (lbs) 1   ABduction Limitations to top shelf of cupboard   Other Standing Exercises elbow flexion 3# x 10     Shoulder Exercises: Pulleys   Flexion Other (comment)  x5 min   Other Pulley Exercises UE Ranger x 5 minutes and wall ladder x 5 minutes.                PT Education - 04/13/17 1027    Education provided Yes   Education  Details reviewed HEP added cupboard TE and supine horizontal ABD   Methods Explanation;Demonstration;Verbal cues   Comprehension Verbalized understanding;Returned demonstration             PT Long Term Goals - 04/13/17 0957      PT LONG TERM GOAL #1   Title Ind with HEP.   Time 6   Period Weeks   Status Achieved     PT LONG TERM GOAL #2   Title Active right shoulder flexion to 150 degrees so the patient can easily reach overhead.   Baseline Met in supine 01/12/17; 118/155 deg in standing on 04/13/17   Time 6   Period Weeks   Status Partially Met     PT LONG TERM GOAL #3   Title Active ER to 70 degrees+ to allow for easily donning/doffing of apparel   Baseline 90 ER in standing at 90 deg ABD as of 04/13/17   Time 6   Period Weeks   Status Achieved     PT LONG TERM GOAL #4   Title Increase ROM so patient is able to reach behind back to L3.   Time 6   Period Weeks   Status Achieved     PT LONG TERM GOAL #5   Title Increase right shoulder strength to a solid 4+/5 to increase stability for performance of functional activities   Baseline R shoulder flex, ext  4+/5, ABD 4/5, IR/ER 5-/5 as of 04/13/17   Status Partially Met     PT LONG TERM GOAL #6   Title Perform ADL's with pain not > 3/10.   Time 6   Period Weeks   Status Achieved               Plan - 04/13/17 1233    Clinical Impression Statement Patient is doing very well with therapy having met or partially met all goals. Her main deficit is strength, limiting functional ROM. She continues to have soreness in her shoulder and biceps, but denies pain.   Rehab Potential Excellent   PT Treatment/Interventions ADLs/Self Care Home Management;Cryotherapy;Electrical Stimulation;Patient/family education;Therapeutic exercise;Therapeutic activities;Manual techniques;Passive range of motion;Vasopneumatic Device   PT Next Visit Plan Await MD orders to d/c or continue. Patient seeing MD 04/14/17.   PT Home Exercise Plan  added cervical retraction and shoulder depression to help with neck pain, added diagonals D1 and D2, towel pulls for IR, sidelying stretch for IR.    Consulted and Agree with Plan of Care Patient      Patient will benefit from skilled therapeutic intervention in order to improve the following deficits and impairments:  Pain, Decreased activity tolerance, Decreased range  of motion  Visit Diagnosis: Stiffness of right shoulder joint     Problem List Patient Active Problem List   Diagnosis Date Noted  . Obese 03/27/2015  . S/P left TKA 03/26/2015  . S/P knee replacement 03/26/2015  . Colitis, acute 12/01/2014  . Weight loss, unintentional 12/01/2014  . Adnexal cyst 12/01/2014  . Elevated fasting glucose 12/01/2014  . CAD (coronary artery disease) 12/01/2014  . Osteoarthritis 12/01/2014  . Vasovagal attack 02/28/2013  . Hyperlipidemia 02/28/2013      PT 04/13/2017, 12:35 PM  [facility] [address] [place], [place], [address] Phone: [phone]   Fax:  [phone]  Name: Carol Cox MRN: [id number] Date of Birth: 05/06/1951  PHYSICAL THERAPY DISCHARGE SUMMARY  Visits from Start of Care: 30.  Current functional level related to goals / functional outcomes: See above.   Remaining deficits: Very good progress toward goals.  LTG's #2 and #5 partially met.   Education / Equipment: HEP. Plan: Patient agrees to discharge.  Patient goals were partially met. Patient is being discharged due to being pleased with the current functional level.  ?????         Chad Applegate MPT  

## 2017-04-15 ENCOUNTER — Other Ambulatory Visit: Payer: Self-pay | Admitting: Cardiology

## 2017-05-01 ENCOUNTER — Other Ambulatory Visit: Payer: Self-pay | Admitting: Cardiology

## 2017-06-08 ENCOUNTER — Telehealth: Payer: Self-pay | Admitting: Cardiology

## 2017-06-08 MED ORDER — EZETIMIBE-SIMVASTATIN 10-40 MG PO TABS: 1.0000 | ORAL_TABLET | Freq: Every day | ORAL | 1 refills | Status: DC

## 2017-06-08 NOTE — Telephone Encounter (Signed)
New message    *STAT* If patient is at the pharmacy, call can be transferred to refill team.   1. Which medications need to be refilled? (please list name of each medication and dose if known) ezetimibe-simvastatin 10-40 mg  2. Which pharmacy/location (including street and city if local pharmacy) is medication to be sent to? [facility] [place]   3. Do they need a 30 day or 90 day supply? 30 day

## 2017-06-08 NOTE — Telephone Encounter (Signed)
Pt's medication was sent to pt's pharmacy as requested. Confirmation received.  °

## 2017-07-15 ENCOUNTER — Encounter: Payer: Self-pay | Admitting: Interventional Cardiology

## 2017-07-15 ENCOUNTER — Encounter (INDEPENDENT_AMBULATORY_CARE_PROVIDER_SITE_OTHER): Payer: Self-pay

## 2017-07-15 ENCOUNTER — Ambulatory Visit (INDEPENDENT_AMBULATORY_CARE_PROVIDER_SITE_OTHER): Payer: Medicare Other | Admitting: Interventional Cardiology

## 2017-07-15 VITALS — BP 142/96 | HR 75 | Ht 66.0 in | Wt 205.8 lb

## 2017-07-15 DIAGNOSIS — E782 Mixed hyperlipidemia: Secondary | ICD-10-CM | POA: Diagnosis not present

## 2017-07-15 DIAGNOSIS — R030 Elevated blood-pressure reading, without diagnosis of hypertension: Secondary | ICD-10-CM

## 2017-07-15 DIAGNOSIS — I2510 Atherosclerotic heart disease of native coronary artery without angina pectoris: Secondary | ICD-10-CM | POA: Diagnosis not present

## 2017-07-15 DIAGNOSIS — Z8249 Family history of ischemic heart disease and other diseases of the circulatory system: Secondary | ICD-10-CM

## 2017-07-15 LAB — COMPREHENSIVE METABOLIC PANEL
A/G RATIO: 1.6 (ref 1.2–2.2)
ALBUMIN: 4.3 g/dL (ref 3.6–4.8)
ALK PHOS: 90 IU/L (ref 39–117)
ALT: 28 IU/L (ref 0–32)
AST: 28 IU/L (ref 0–40)
BILIRUBIN TOTAL: 0.4 mg/dL (ref 0.0–1.2)
BUN / CREAT RATIO: 25 (ref 12–28)
BUN: 25 mg/dL (ref 8–27)
CHLORIDE: 104 mmol/L (ref 96–106)
CO2: 23 mmol/L (ref 20–29)
Calcium: 9.8 mg/dL (ref 8.7–10.3)
Creatinine, Ser: 1 mg/dL (ref 0.57–1.00)
GFR calc Af Amer: 68 mL/min/{1.73_m2} (ref >59)
GFR calc non Af Amer: 59 mL/min/{1.73_m2} — ABNORMAL LOW (ref >59)
GLOBULIN, TOTAL: 2.7 g/dL (ref 1.5–4.5)
GLUCOSE: 110 mg/dL — AB (ref 65–99)
POTASSIUM: 4.4 mmol/L (ref 3.5–5.2)
Sodium: 140 mmol/L (ref 134–144)
Total Protein: 7 g/dL (ref 6.0–8.5)

## 2017-07-15 LAB — LIPID PANEL
Chol/HDL Ratio: 3.6 ratio (ref 0.0–4.4)
Cholesterol, Total: 180 mg/dL (ref 100–199)
HDL: 50 mg/dL (ref >39)
LDL Calculated: 99 mg/dL (ref 0–99)
TRIGLYCERIDES: 157 mg/dL — AB (ref 0–149)
VLDL CHOLESTEROL CAL: 31 mg/dL (ref 5–40)

## 2017-07-15 MED ORDER — EZETIMIBE-SIMVASTATIN 10-40 MG PO TABS: 1.0000 | ORAL_TABLET | Freq: Every day | ORAL | 1 refills | Status: DC

## 2017-07-15 NOTE — Progress Notes (Signed)
Cardiology Office Note   Date:  07/15/2017   ID:  Carol, Cox 06-Sep-1951, MRN [id number]  PCP:  Christain Sacramento, MD    No chief complaint on file.  Mild CAD  Wt Readings from Last 3 Encounters:  07/15/17 205 lb 12.8 oz (93.4 kg)  06/02/16 200 lb 8 oz (90.9 kg)  04/24/15 196 lb (88.9 kg)       History of Present Illness: Carol Cox is a 66 y.o. female  who has a strong family history of coronary artery disease.  She had chest discomfort in 2014. This resulted in a heart catheterization which showed mild, nonobstructive coronary artery disease. She was thought to have non-cardiac chest pain.  Recently, she has not had problems.  SHe needs a refill of her Vytorin.  She has had trouble tolerating lipitor and crestor due to muscle pain.  Denies : Chest pain. Dizziness. Leg edema. Nitroglycerin use. Orthopnea. Palpitations. Paroxysmal nocturnal dyspnea. Shortness of breath. Syncope.   She has not been exercising a lot.  SHe is trying to get back to walking.  SHe has had knee surgery a few years ago and then shoulder surgery more recently.  She has ankle tendinitis as well.  She is trying to increase her waling.  Stationary bike has worsened knee pain.    Past Medical History:  Diagnosis Date  . Arthritis    "knee, left hip, ankles"   . Borderline glaucoma   . CAD (coronary artery disease)    Mild nonobstructive CAD by cath 02/06/13 (25% mid LAD), normal EF  . Family history of adverse reaction to anesthesia    mother has problems with nausea and vomiting   . History of colitis    02/ 2016  acute infectious colitis-- resolved  . Hyperlipidemia   . Left ovarian cyst   . PONV (postoperative nausea and vomiting)    SEVERE    Past Surgical History:  Procedure Laterality Date  . CARPAL TUNNEL RELEASE Bilateral 1990's  . KNEE ARTHROSCOPY W/ MENISCECTOMY Left 07/2014  . LEFT HEART CATHETERIZATION WITH CORONARY ANGIOGRAM N/A 02/06/2013   Procedure: LEFT HEART  CATHETERIZATION WITH CORONARY ANGIOGRAM;  Surgeon: [name] Hampshire, MD;  Location: [facility]ATH [facility];  Service: Cardiovascular;  Laterality: N/A;  non-obstruction 25% mLAD,  normal LVF , ef 65-70%  . left knee meniscus surgery     . TOTAL KNEE ARTHROPLASTY Left 03/26/2015   Procedure: LEFT TOTAL KNEE ARTHROPLASTY;  Surgeon: Paralee Cancel, MD;  Location: [facility];  Service: Orthopedics;  Laterality: Left;     Current Outpatient Prescriptions  Medication Sig Dispense Refill  . aspirin EC 81 MG tablet Take 81 mg by mouth daily.    . Calcium-Magnesium-Vitamin D (CALCIUM 1200+D3 PO) Take 1 tablet by mouth daily.    Marland Kitchen CALCIUM-MAGNESIUM-VITAMIN D PO Take 1 tablet by mouth daily.    . Cholecalciferol (VITAMIN D-3) 1000 units CAPS Take 1 capsule by mouth daily.    . Coenzyme Q10 (COQ10 PO) Take 1 capsule by mouth daily.    Marland Kitchen ezetimibe-simvastatin (VYTORIN) 10-40 MG tablet Take 1 tablet by mouth at bedtime. 30 tablet 1  . fish oil-omega-3 fatty acids 1000 MG capsule Take 1 g by mouth daily.      Marland Kitchen ibuprofen (ADVIL,MOTRIN) 600 MG tablet Take 1 tablet (600 mg total) by mouth every 6 (six) hours as needed. 30 tablet 0  . METROGEL 1 % gel Apply 1 application topically daily.     Marland Kitchen  Multiple Vitamin (MULTIVITAMIN) tablet Take 1 tablet by mouth daily.      Marland Kitchen oxyCODONE-acetaminophen (ROXICET) 5-325 MG tablet Take 1-2 tablets by mouth every 4 (four) hours as needed for severe pain. 30 tablet 0   No current facility-administered medications for this visit.     Allergies:   Crestor [rosuvastatin] and Lipitor [atorvastatin]    Social History:  The patient  reports that she has never smoked. She has never used smokeless tobacco. She reports that she does not drink alcohol or use drugs.   Family History:  The patient's family history includes CAD (age of onset: 83) in her maternal uncle; CAD (age of onset: 33) in her maternal uncle; CAD (age of onset: 47) in her sister; CVA in her mother; Heart attack (age of onset: 15)  in her maternal uncle; Heart attack (age of onset: 3) in her maternal uncle; Heart attack (age of onset: 30) in her sister; Heart attack (age of onset: 21) in her sister; Sudden death (age of onset: 35) in her father.    ROS:  Please see the history of present illness.   Otherwise, review of systems are positive for joint pains.   All other systems are reviewed and negative.    PHYSICAL EXAM: VS:  BP (!) 142/96   Pulse 75   Ht 5\' 6"  (1.676 m)   Wt 205 lb 12.8 oz (93.4 kg)   SpO2 98%   BMI 33.22 kg/m  , BMI Body mass index is 33.22 kg/m. GEN: Well nourished, well developed, in no acute distress  HEENT: normal  Neck: no JVD, carotid bruits, or masses Cardiac: RRR; no murmurs, rubs, or gallops,no edema  Respiratory:  clear to auscultation bilaterally, normal work of breathing GI: soft, nontender, nondistended, + BS MS: no deformity or atrophy  Skin: warm and dry, no rash Neuro:  Strength and sensation are intact Psych: euthymic mood, full affect   EKG:   The ekg ordered today demonstrates normal sinus rhythm, no ST changes   Recent Labs: No results found for requested labs within last 8760 hours.   Lipid Panel    Component Value Date/Time   CHOL 171 04/22/2015 0909   TRIG 177.0 (H) 04/22/2015 0909   HDL 45.00 04/22/2015 0909   CHOLHDL 4 04/22/2015 0909   VLDL 35.4 04/22/2015 0909   LDLCALC 91 04/22/2015 0909   LDLDIRECT 106.3 05/09/2013 0921     Other studies Reviewed: Additional studies/ records that were reviewed today with results demonstrating: prior cath from 2014 reviewed.   ASSESSMENT AND PLAN:  1. CAD: Mild in 2014. Agree with aggressive prevention given family h/o CAD. 2. Family h/o early CAD: As noted above.  3. Hyperlipidemia: Refill vytorin.  She needs lipids checked today.  SHe has not seen PMD in some time as well. Will adjust dose of Vytorin based on results.  4. Elevated BP readings: It has been high at MDs offices.  Diastolics typically in the 40N  and systolics in the 027-253G.      Current medicines are reviewed at length with the patient today.  The patient concerns regarding her medicines were addressed.  The following changes have been made:  No change  Labs/ tests ordered today include: lipids No orders of the defined types were placed in this encounter.   Recommend 150 minutes/week of aerobic exercise Low fat, low carb, high fiber diet recommended  Disposition:   FU in 1 year   Signed, Larae Grooms, MD  07/15/2017 9:30 AM  [facility] [place], [place], [place]  [address] Phone: [phone]; Fax: [phone]

## 2017-07-15 NOTE — Patient Instructions (Signed)
Medication Instructions:  Your physician recommends that you continue on your current medications as directed. Please refer to the Current Medication list given to you today.   Labwork: LABS TODAY: CMET, LIPIDs  Testing/Procedures: None ordered  Follow-Up: Your physician wants you to follow-up in: 1 year with Dr. Irish Lack. You will receive a reminder letter in the mail two months in advance. If you don't receive a letter, please call our office to schedule the follow-up appointment.   Any Other Special Instructions Will Be Listed Below (If Applicable).     If you need a refill on your cardiac medications before your next appointment, please call your pharmacy.

## 2017-07-16 ENCOUNTER — Telehealth: Payer: Self-pay | Admitting: *Deleted

## 2017-07-16 NOTE — Telephone Encounter (Signed)
-----   Message from Jettie Booze, MD sent at 07/16/2017 12:14 PM EDT ----- Lipids, liver and electrolytes well controlled.

## 2017-07-16 NOTE — Telephone Encounter (Signed)
Pt has been notified of lab results by phone with verbal understanding. Pt thanked me for my call.  

## 2018-03-25 ENCOUNTER — Encounter: Payer: Self-pay | Admitting: Internal Medicine

## 2018-05-27 ENCOUNTER — Ambulatory Visit (AMBULATORY_SURGERY_CENTER): Payer: Self-pay

## 2018-05-27 VITALS — Ht 66.0 in | Wt 197.8 lb

## 2018-05-27 DIAGNOSIS — Z86010 Personal history of colonic polyps: Secondary | ICD-10-CM

## 2018-05-27 MED ORDER — NA SULFATE-K SULFATE-MG SULF 17.5-3.13-1.6 GM/177ML PO SOLN: 1.0000 | Freq: Once | ORAL | 0 refills | Status: AC

## 2018-05-27 NOTE — Progress Notes (Signed)
Per pt, no allergies to soy or egg products.Pt not taking any weight loss meds or using  O2 at home.  Pt refused emmi video. 

## 2018-06-01 ENCOUNTER — Encounter: Payer: Self-pay | Admitting: Internal Medicine

## 2018-06-04 ENCOUNTER — Other Ambulatory Visit: Payer: Self-pay | Admitting: Interventional Cardiology

## 2018-06-10 ENCOUNTER — Ambulatory Visit (AMBULATORY_SURGERY_CENTER): Payer: Medicare Other | Admitting: Internal Medicine

## 2018-06-10 ENCOUNTER — Encounter: Payer: Self-pay | Admitting: Internal Medicine

## 2018-06-10 VITALS — BP 145/86 | HR 72 | Temp 98.2°F | Resp 16 | Ht 66.0 in | Wt 205.0 lb

## 2018-06-10 DIAGNOSIS — Z86010 Personal history of colonic polyps: Secondary | ICD-10-CM | POA: Diagnosis present

## 2018-06-10 DIAGNOSIS — D124 Benign neoplasm of descending colon: Secondary | ICD-10-CM

## 2018-06-10 DIAGNOSIS — K5140 Inflammatory polyps of colon without complications: Secondary | ICD-10-CM

## 2018-06-10 MED ORDER — SODIUM CHLORIDE 0.9 % IV SOLN: 500.0000 mL | INTRAVENOUS | Status: DC

## 2018-06-10 NOTE — Progress Notes (Signed)
Called to room to assist during endoscopic procedure.  Patient ID and intended procedure confirmed with present staff. Received instructions for my participation in the procedure from the performing physician.  

## 2018-06-10 NOTE — Patient Instructions (Signed)
Handout given on polyps  YOU HAD AN ENDOSCOPIC PROCEDURE TODAY AT THE [facility]:   Refer to the procedure report that was given to you for any specific questions about what was found during the examination.  If the procedure report does not answer your questions, please call your gastroenterologist to clarify.  If you requested that your care partner not be given the details of your procedure findings, then the procedure report has been included in a sealed envelope for you to review at your convenience later.  YOU SHOULD EXPECT: Some feelings of bloating in the abdomen. Passage of more gas than usual.  Walking can help get rid of the air that was put into your GI tract during the procedure and reduce the bloating. If you had a lower endoscopy (such as a colonoscopy or flexible sigmoidoscopy) you may notice spotting of blood in your stool or on the toilet paper. If you underwent a bowel prep for your procedure, you may not have a normal bowel movement for a few days.  Please Note:  You might notice some irritation and congestion in your nose or some drainage.  This is from the oxygen used during your procedure.  There is no need for concern and it should clear up in a day or so.  SYMPTOMS TO REPORT IMMEDIATELY:   Following lower endoscopy (colonoscopy or flexible sigmoidoscopy):  Excessive amounts of blood in the stool  Significant tenderness or worsening of abdominal pains  Swelling of the abdomen that is new, acute  Fever of 100F or higher    For urgent or emergent issues, a gastroenterologist can be reached at any hour by calling [phone].   DIET:  We do recommend a small meal at first, but then you may proceed to your regular diet.  Drink plenty of fluids but you should avoid alcoholic beverages for 24 hours.  ACTIVITY:  You should plan to take it easy for the rest of today and you should NOT DRIVE or use heavy machinery until tomorrow (because of the sedation  medicines used during the test).    FOLLOW UP: Our staff will call the number listed on your records the next business day following your procedure to check on you and address any questions or concerns that you may have regarding the information given to you following your procedure. If we do not reach you, we will leave a message.  However, if you are feeling well and you are not experiencing any problems, there is no need to return our call.  We will assume that you have returned to your regular daily activities without incident.  If any biopsies were taken you will be contacted by phone or by letter within the next 1-3 weeks.  Please call us at [phone] if you have not heard about the biopsies in 3 weeks.    SIGNATURES/CONFIDENTIALITY: You and/or your care partner have signed paperwork which will be entered into your electronic medical record.  These signatures attest to the fact that that the information above on your After Visit Summary has been reviewed and is understood.  Full responsibility of the confidentiality of this discharge information lies with you and/or your care-partner. 

## 2018-06-10 NOTE — Op Note (Signed)
[facility] Patient Name: Carol Cox Procedure Date: 06/10/2018 11:50 AM MRN: [id number] Endoscopist: Docia Chuck. Henrene Pastor , MD Age: 67 Referring MD:  Date of Birth: 03-23-1951 Gender: Female Account #: [id number] Procedure:                Colonoscopy, with cold snare polypectomy x 1 Indications:              High risk colon cancer surveillance: Personal                            history of multiple (3 or more) adenomas, High risk                            colon cancer surveillance: Personal history of                            sessile serrated colon polyp (less than 10 mm in                            size) with no dysplasia. Previous examinations 2005                            and 2012 Medicines:                Monitored Anesthesia Care Procedure:                Pre-Anesthesia Assessment:                           - Prior to the procedure, a History and Physical                            was performed, and patient medications and                            allergies were reviewed. The patient's tolerance of                            previous anesthesia was also reviewed. The risks                            and benefits of the procedure and the sedation                            options and risks were discussed with the patient.                            All questions were answered, and informed consent                            was obtained. Prior Anticoagulants: The patient has                            taken no previous anticoagulant or antiplatelet  agents. ASA Grade Assessment: II - A patient with                            mild systemic disease. After reviewing the risks                            and benefits, the patient was deemed in                            satisfactory condition to undergo the procedure.                           After obtaining informed consent, the colonoscope                            was passed under direct  vision. Throughout the                            procedure, the patient's blood pressure, pulse, and                            oxygen saturations were monitored continuously. The                            Model CF-HQ190L [id number] scope was introduced                            through the anus and advanced to the the cecum,                            identified by appendiceal orifice and ileocecal                            valve. The ileocecal valve, appendiceal orifice,                            and rectum were photographed. The quality of the                            bowel preparation was excellent. The colonoscopy                            was performed without difficulty. The patient                            tolerated the procedure well. The bowel preparation                            used was SUPREP. Scope In: 12:00:22 PM Scope Out: 12:18:50 PM Scope Withdrawal Time: 0 hours 10 minutes 56 seconds  Total Procedure Duration: 0 hours 18 minutes 28 seconds  Findings:                 A 4 mm polyp was found in the descending colon. The  polyp was removed with a cold snare. Resection and                            retrieval were complete.                           The exam was otherwise without abnormality on                            direct and retroflexion views. Complications:            No immediate complications. Estimated blood loss:                            None. Estimated Blood Loss:     Estimated blood loss: none. Impression:               - One 4 mm polyp in the descending colon, removed                            with a cold snare. Resected and retrieved.                           - The examination was otherwise normal on direct                            and retroflexion views. Recommendation:           - Repeat colonoscopy in 5 years for surveillance.                           - Patient has a contact number available for                             emergencies. The signs and symptoms of potential                            delayed complications were discussed with the                            patient. Return to normal activities tomorrow.                            Written discharge instructions were provided to the                            patient.                           - Resume previous diet.                           - Continue present medications.                           - Await pathology results. Docia Chuck. Henrene Pastor, MD 06/10/2018 12:24:38 PM This report has been  signed electronically.

## 2018-06-10 NOTE — Progress Notes (Signed)
To PACU, VSS. Report to [email or web address] 

## 2018-06-13 ENCOUNTER — Telehealth: Payer: Self-pay

## 2018-06-13 NOTE — Telephone Encounter (Signed)
lbgi   Follow up Call-  Call back number 06/10/2018  Post procedure Call Back phone  # [phone]  Permission to leave phone message Yes  Some recent data might be hidden     Patient questions:  Do you have a fever, pain , or abdominal swelling? No. Pain Score  0 *  Have you tolerated food without any problems? Yes.    Have you been able to return to your normal activities? Yes.    Do you have any questions about your discharge instructions: Diet   No. Medications  No. Follow up visit  No.  Do you have questions or concerns about your Care? No.  Actions: * If pain score is 4 or above: No action needed, pain <4.

## 2018-06-14 ENCOUNTER — Encounter: Payer: Self-pay | Admitting: Internal Medicine

## 2018-08-12 NOTE — Progress Notes (Signed)
Cardiology Office Note   Date:  08/15/2018   ID:  [name], Cox 1951/08/14, MRN [id number]  PCP:  Christain Sacramento, MD    No chief complaint on file.  FH of CAD  Wt Readings from Last 3 Encounters:  08/15/18 199 lb 3.2 oz (90.4 kg)  06/10/18 205 lb (93 kg)  05/27/18 197 lb 12.8 oz (89.7 kg)       History of Present Illness: Carol Cox is a 67 y.o. female  who has a strong family history of coronary artery disease including her Dad at age 3, a sister at 44 with MI, maternal uncle with MI in his 70s.  She had chest discomfort in 2014. This resulted in a heart catheterization which showed mild, nonobstructive coronary artery disease. She was thought to have non-cardiac chest pain.   SHe has had knee surgery a few years ago and then shoulder surgery more recently.  She has ankle tendinitis as well.  Stationary bike has worsened knee pain.  Denies : Chest pain. Dizziness. Leg edema. Nitroglycerin use. Orthopnea. Palpitations. Paroxysmal nocturnal dyspnea. Shortness of breath. Syncope.   No change in her family history.   She now has an elliptical which does well with her joint pains. She is increasing the duration of exercise.    Past Medical History:  Diagnosis Date  . Allergy    seasonal  . Arthritis    "knee, left hip, ankles"   . Borderline glaucoma   . CAD (coronary artery disease)    Mild nonobstructive CAD by cath 02/06/13 (25% mid LAD), normal EF  . Family history of adverse reaction to anesthesia    mother has problems with nausea and vomiting   . History of colitis    02/ 2016  acute infectious colitis-- resolved  . Hyperlipidemia   . Left ovarian cyst   . Osteoporosis    ospenia of hips  . PONV (postoperative nausea and vomiting)    SEVERE    Past Surgical History:  Procedure Laterality Date  . CARPAL TUNNEL RELEASE Bilateral 1990's  . KNEE ARTHROSCOPY W/ MENISCECTOMY Left 07/2014  . LEFT HEART CATHETERIZATION WITH CORONARY ANGIOGRAM N/A  02/06/2013   Procedure: LEFT HEART CATHETERIZATION WITH CORONARY ANGIOGRAM;  Surgeon: [name] Hampshire, MD;  Location: [facility];  Service: Cardiovascular;  Laterality: N/A;  non-obstruction 25% mLAD,  normal LVF , ef 65-70%  . left knee meniscus surgery     . NASAL SINUS SURGERY    . ovarian cyst     left ovary  . ROTATOR CUFF REPAIR Right 10/2016  . TOTAL KNEE ARTHROPLASTY Left 03/26/2015   Procedure: LEFT TOTAL KNEE ARTHROPLASTY;  Surgeon: Paralee Cancel, MD;  Location: [facility];  Service: Orthopedics;  Laterality: Left;     Current Outpatient Medications  Medication Sig Dispense Refill  . aspirin EC 81 MG tablet Take 81 mg by mouth daily.    . Calcium Carbonate-Vit D-Min (CALCIUM 1200 PO) Take by mouth daily.    . Calcium-Magnesium-Vitamin D (CALCIUM 1200+D3 PO) Take 1 tablet by mouth daily.    Marland Kitchen CALCIUM-MAGNESIUM-VITAMIN D PO Take 1 tablet by mouth daily.    . Cholecalciferol (VITAMIN D-3) 1000 units CAPS Take 1 capsule by mouth daily. Take 5000 units daily    . Coenzyme Q10 (COQ10 PO) Take 1 capsule by mouth daily.    Marland Kitchen ezetimibe-simvastatin (VYTORIN) 10-40 MG tablet TAKE 1 TABLET BY MOUTH EVERYDAY AT BEDTIME 30 tablet 1  . fish oil-omega-3  fatty acids 1000 MG capsule Take 1 g by mouth daily.      Marland Kitchen ibuprofen (ADVIL,MOTRIN) 600 MG tablet Take 1 tablet (600 mg total) by mouth every 6 (six) hours as needed. 30 tablet 0  . METROGEL 1 % gel Apply 1 application topically daily.     . Multiple Vitamin (MULTIVITAMIN) tablet Take 1 tablet by mouth daily.      . psyllium (METAMUCIL) 58.6 % powder Take 1 packet by mouth as needed.    . timolol (TIMOPTIC) 0.5 % ophthalmic solution Place 1 drop into both eyes 2 (two) times daily.  0  . Travoprost (TRAVATAN Z OP) Apply to eye. Use one drop in each eye daily     Current Facility-Administered Medications  Medication Dose Route Frequency Provider Last Rate Last Dose  . 0.9 %  sodium chloride infusion  500 mL Intravenous Continuous Irene Shipper, MD         Allergies:   Atorvastatin and Rosuvastatin    Social History:  The patient  reports that she has never smoked. She has never used smokeless tobacco. She reports that she does not drink alcohol or use drugs.   Family History:  The patient's family history includes CAD in her sister; CAD (age of onset: 82) in her maternal uncle; CAD (age of onset: 75) in her maternal uncle; CVA in her mother; Heart attack in her father; Heart attack (age of onset: 63) in her maternal uncle; Heart attack (age of onset: 41) in her maternal uncle; Heart attack (age of onset: 43) in her sister; Sudden death (age of onset: 36) in her father.    ROS:  Please see the history of present illness.   Otherwise, review of systems are positive for increasing exercise.   All other systems are reviewed and negative.    PHYSICAL EXAM: VS:  BP 132/90   Pulse 67   Ht 5\' 6"  (1.676 m)   Wt 199 lb 3.2 oz (90.4 kg)   SpO2 96%   BMI 32.15 kg/m  , BMI Body mass index is 32.15 kg/m. GEN: Well nourished, well developed, in no acute distress  HEENT: normal  Neck: no JVD, carotid bruits, or masses Cardiac: RRR; no murmurs, rubs, or gallops,no edema  Respiratory:  clear to auscultation bilaterally, normal work of breathing GI: soft, nontender, nondistended, + BS MS: no deformity or atrophy  Skin: warm and dry, no rash Neuro:  Strength and sensation are intact Psych: euthymic mood, full affect   EKG:   The ekg ordered today demonstrates NSR, no ST changes   Recent Labs: No results found for requested labs within last 8760 hours.   Lipid Panel    Component Value Date/Time   CHOL 180 07/15/2017 0941   TRIG 157 (H) 07/15/2017 0941   HDL 50 07/15/2017 0941   CHOLHDL 3.6 07/15/2017 0941   CHOLHDL 4 04/22/2015 0909   VLDL 35.4 04/22/2015 0909   LDLCALC 99 07/15/2017 0941   LDLDIRECT 106.3 05/09/2013 0921     Other studies Reviewed: Additional studies/ records that were reviewed today with results  demonstrating: Prior cath result noted. LDL 99 in 2018.     ASSESSMENT AND PLAN:  1. CAD: Mild in 2014.  Continue preventive therapy.  No new signs of ischemia. 2. Hyperlipidemia: Needs recheck of lipids. Continue Vytorin.  Taking it 3x/ week due to leg pain.   3. Family h/o CAD: No change since prior.   4. Elevated BP readings: Better at home  than what we see today in the office.  Readings are typically in the 130-140/82 range.  Increase exercise.  Decrease salt intake. Would start ACE-I if BP was higher than 150/90.   Current medicines are reviewed at length with the patient today.  The patient concerns regarding her medicines were addressed.  The following changes have been made:   Labs/ tests ordered today include: lipids today No orders of the defined types were placed in this encounter.   Recommend 150 minutes/week of aerobic exercise Low fat, low carb, high fiber diet recommended  Disposition:   FU in 1 year   Signed, Larae Grooms, MD  08/15/2018 11:12 AM    [facility], [place], [place]  [address] Phone: [phone]; Fax: [phone]

## 2018-08-15 ENCOUNTER — Encounter (INDEPENDENT_AMBULATORY_CARE_PROVIDER_SITE_OTHER): Payer: Self-pay

## 2018-08-15 ENCOUNTER — Encounter: Payer: Self-pay | Admitting: Interventional Cardiology

## 2018-08-15 ENCOUNTER — Ambulatory Visit: Payer: Medicare Other | Admitting: Interventional Cardiology

## 2018-08-15 VITALS — BP 132/90 | HR 67 | Ht 66.0 in | Wt 199.2 lb

## 2018-08-15 DIAGNOSIS — E782 Mixed hyperlipidemia: Secondary | ICD-10-CM

## 2018-08-15 DIAGNOSIS — Z8249 Family history of ischemic heart disease and other diseases of the circulatory system: Secondary | ICD-10-CM | POA: Diagnosis not present

## 2018-08-15 DIAGNOSIS — I2510 Atherosclerotic heart disease of native coronary artery without angina pectoris: Secondary | ICD-10-CM | POA: Diagnosis not present

## 2018-08-15 DIAGNOSIS — R030 Elevated blood-pressure reading, without diagnosis of hypertension: Secondary | ICD-10-CM

## 2018-08-15 NOTE — Patient Instructions (Signed)
Medication Instructions:  Your physician recommends that you continue on your current medications as directed. Please refer to the Current Medication list given to you today.  If you need a refill on your cardiac medications before your next appointment, please call your pharmacy.   Lab work: TODAY: CMET, LIPIDS  If you have labs (blood work) drawn today and your tests are completely normal, you will receive your results only by: . MyChart Message (if you have MyChart) OR . A paper copy in the mail If you have any lab test that is abnormal or we need to change your treatment, we will call you to review the results.  Testing/Procedures: None ordered  Follow-Up: At [facility], you and your health needs are our priority.  As part of our continuing mission to provide you with exceptional heart care, we have created designated Provider Care Teams.  These Care Teams include your primary Cardiologist (physician) and Advanced Practice Providers (APPs -  Physician Assistants and Nurse Practitioners) who all work together to provide you with the care you need, when you need it. . You will need a follow up appointment in 1 year.  Please call our office 2 months in advance to schedule this appointment.  You may see Jay Varanasi, MD or one of the following Advanced Practice Providers on your designated Care Team:   . Brittainy Simmons, PA-C . Dayna Dunn, PA-C . Michele Lenze, PA-C  Any Other Special Instructions Will Be Listed Below (If Applicable).    

## 2018-08-16 LAB — COMPREHENSIVE METABOLIC PANEL
ALK PHOS: 71 IU/L (ref 39–117)
ALT: 28 IU/L (ref 0–32)
AST: 29 IU/L (ref 0–40)
Albumin/Globulin Ratio: 1.7 (ref 1.2–2.2)
Albumin: 4.5 g/dL (ref 3.6–4.8)
BUN/Creatinine Ratio: 17 (ref 12–28)
BUN: 17 mg/dL (ref 8–27)
Bilirubin Total: 0.4 mg/dL (ref 0.0–1.2)
CO2: 26 mmol/L (ref 20–29)
CREATININE: 1.03 mg/dL — AB (ref 0.57–1.00)
Calcium: 10.1 mg/dL (ref 8.7–10.3)
Chloride: 104 mmol/L (ref 96–106)
GFR calc Af Amer: 65 mL/min/{1.73_m2} (ref >59)
GFR calc non Af Amer: 57 mL/min/{1.73_m2} — ABNORMAL LOW (ref >59)
Globulin, Total: 2.6 g/dL (ref 1.5–4.5)
Glucose: 96 mg/dL (ref 65–99)
Potassium: 4.7 mmol/L (ref 3.5–5.2)
SODIUM: 143 mmol/L (ref 134–144)
Total Protein: 7.1 g/dL (ref 6.0–8.5)

## 2018-08-16 LAB — LIPID PANEL
CHOLESTEROL TOTAL: 199 mg/dL (ref 100–199)
Chol/HDL Ratio: 4 ratio (ref 0.0–4.4)
HDL: 50 mg/dL (ref >39)
LDL CALC: 113 mg/dL — AB (ref 0–99)
TRIGLYCERIDES: 182 mg/dL — AB (ref 0–149)
VLDL Cholesterol Cal: 36 mg/dL (ref 5–40)

## 2018-08-28 ENCOUNTER — Other Ambulatory Visit: Payer: Self-pay | Admitting: Interventional Cardiology

## 2019-10-08 ENCOUNTER — Other Ambulatory Visit: Payer: Self-pay

## 2019-10-08 ENCOUNTER — Encounter (HOSPITAL_COMMUNITY): Payer: Self-pay

## 2019-10-08 ENCOUNTER — Emergency Department (HOSPITAL_COMMUNITY)
Admission: EM | Admit: 2019-10-08 | Discharge: 2019-10-08 | Disposition: A | Discharge status: Home / Self Care | Payer: Medicare Other | Attending: Emergency Medicine | Admitting: Emergency Medicine

## 2019-10-08 DIAGNOSIS — Z7982 Long term (current) use of aspirin: Secondary | ICD-10-CM | POA: Insufficient documentation

## 2019-10-08 DIAGNOSIS — M545 Low back pain, unspecified: Secondary | ICD-10-CM

## 2019-10-08 DIAGNOSIS — I2510 Atherosclerotic heart disease of native coronary artery without angina pectoris: Secondary | ICD-10-CM | POA: Insufficient documentation

## 2019-10-08 DIAGNOSIS — Z79899 Other long term (current) drug therapy: Secondary | ICD-10-CM | POA: Insufficient documentation

## 2019-10-08 DIAGNOSIS — Z96659 Presence of unspecified artificial knee joint: Secondary | ICD-10-CM | POA: Insufficient documentation

## 2019-10-08 HISTORY — DX: Unspecified glaucoma: H40.9

## 2019-10-08 MED ORDER — METHOCARBAMOL 500 MG PO TABS: 500.0000 mg | ORAL_TABLET | Freq: Three times a day (TID) | ORAL | 0 refills | Status: DC | PRN

## 2019-10-08 MED ORDER — PREDNISONE 20 MG PO TABS: 40.0000 mg | ORAL_TABLET | Freq: Every day | ORAL | 0 refills | Status: DC

## 2019-10-08 MED ORDER — KETOROLAC TROMETHAMINE 30 MG/ML IJ SOLN
15.0000 mg | Freq: Once | INTRAMUSCULAR | Status: AC
  Administered 2019-10-08: 15 mg via INTRAMUSCULAR
  Filled 2019-10-08: qty 1

## 2019-10-08 NOTE — Discharge Instructions (Addendum)
Talk to your ophthalmologist.  If you are still feeling bad and they improve you can then fill the prednisone.  Follow-up with Dr. Veverly Fells.

## 2019-10-08 NOTE — ED Provider Notes (Signed)
[facility] DEPT Provider Note   CSN: [id number] Arrival date & time: 10/08/19  [id number]     History Chief Complaint  Patient presents with  . Back Pain    Carol Cox is a 68 y.o. female.  HPI Patient with acute on chronic low back pain.  Worse last few days.  Has been seeing Dr. Veverly Fells for this in the past.  States she has disc problems.  No new injury.  Has been feeling bad for around 4 days now.  Does not radiate down legs.  Pain tends to be more on the left side.  No loss of bladder bowel control.  No dysuria.  No diarrhea or constipation.  No fevers.  Has a history of glaucoma.  No abdominal pain.  No relief with over-the-counter medications at home.    Past Medical History:  Diagnosis Date  . Allergy    seasonal  . Arthritis    "knee, left hip, ankles"   . Borderline glaucoma   . CAD (coronary artery disease)    Mild nonobstructive CAD by cath 02/06/13 (25% mid LAD), normal EF  . Family history of adverse reaction to anesthesia    mother has problems with nausea and vomiting   . Glaucoma   . History of colitis    02/ 2016  acute infectious colitis-- resolved  . Hyperlipidemia   . Left ovarian cyst   . Osteoporosis    ospenia of hips  . PONV (postoperative nausea and vomiting)    SEVERE    Patient Active Problem List   Diagnosis Date Noted  . Family history of early CAD 07/15/2017  . Elevated blood pressure reading in office without diagnosis of hypertension 07/15/2017  . Obese 03/27/2015  . S/P left TKA 03/26/2015  . S/P knee replacement 03/26/2015  . Colitis, acute 12/01/2014  . Weight loss, unintentional 12/01/2014  . Adnexal cyst 12/01/2014  . Elevated fasting glucose 12/01/2014  . CAD (coronary artery disease) 12/01/2014  . Osteoarthritis 12/01/2014  . Vasovagal attack 02/28/2013  . Hyperlipidemia 02/28/2013    Past Surgical History:  Procedure Laterality Date  . CARPAL TUNNEL RELEASE Bilateral 1990's  . KNEE  ARTHROSCOPY W/ MENISCECTOMY Left 07/2014  . LEFT HEART CATHETERIZATION WITH CORONARY ANGIOGRAM N/A 02/06/2013   Procedure: LEFT HEART CATHETERIZATION WITH CORONARY ANGIOGRAM;  Surgeon: [name] Hampshire, MD;  Location: [facility];  Service: Cardiovascular;  Laterality: N/A;  non-obstruction 25% mLAD,  normal LVF , ef 65-70%  . left knee meniscus surgery     . NASAL SINUS SURGERY    . ovarian cyst     left ovary  . ROTATOR CUFF REPAIR Right 10/2016  . TOTAL KNEE ARTHROPLASTY Left 03/26/2015   Procedure: LEFT TOTAL KNEE ARTHROPLASTY;  Surgeon: Paralee Cancel, MD;  Location: [facility];  Service: Orthopedics;  Laterality: Left;     OB History   No obstetric history on file.     Family History  Problem Relation Age of Onset  . Sudden death Father 87       Presumed heart attack  . Heart attack Father   . CVA Mother   . CAD Maternal Uncle 59  . CAD Maternal Uncle 45  . Heart attack Sister 78  . CAD Sister   . Heart attack Maternal Uncle 42  . Heart attack Maternal Uncle 50    Social History   Tobacco Use  . Smoking status: Never Smoker  . Smokeless tobacco: Never Used  Substance Use  Topics  . Alcohol use: No  . Drug use: No    Home Medications Prior to Admission medications   Medication Sig Start Date End Date Taking? Authorizing Provider  aspirin EC 81 MG tablet Take 81 mg by mouth daily.    [provider]  Calcium Carbonate-Vit D-Min (CALCIUM 1200 PO) Take by mouth daily.    [provider]  Calcium-Magnesium-Vitamin D (CALCIUM 1200+D3 PO) Take 1 tablet by mouth daily.    [provider]  CALCIUM-MAGNESIUM-VITAMIN D PO Take 1 tablet by mouth daily.    [provider]  Cholecalciferol (VITAMIN D-3) 1000 units CAPS Take 1 capsule by mouth daily. Take 5000 units daily    [provider]  Coenzyme Q10 (COQ10 PO) Take 1 capsule by mouth daily.    [provider]  ezetimibe-simvastatin (VYTORIN) 10-40 MG tablet TAKE 1 TABLET BY  MOUTH EVERYDAY AT BEDTIME 08/30/18   Jettie Booze, MD  fish oil-omega-3 fatty acids 1000 MG capsule Take 1 g by mouth daily.      [provider]  ibuprofen (ADVIL,MOTRIN) 600 MG tablet Take 1 tablet (600 mg total) by mouth every 6 (six) hours as needed. 06/02/16   Marylynn Pearson, MD  methocarbamol (ROBAXIN) 500 MG tablet Take 1 tablet (500 mg total) by mouth every 8 (eight) hours as needed for muscle spasms. 10/08/19   Davonna Belling, MD  METROGEL 1 % gel Apply 1 application topically daily.  07/22/11   [provider]  Multiple Vitamin (MULTIVITAMIN) tablet Take 1 tablet by mouth daily.      [provider]  predniSONE (DELTASONE) 20 MG tablet Take 2 tablets (40 mg total) by mouth daily. 10/08/19   Davonna Belling, MD  psyllium (METAMUCIL) 58.6 % powder Take 1 packet by mouth as needed.    [provider]  timolol (TIMOPTIC) 0.5 % ophthalmic solution Place 1 drop into both eyes 2 (two) times daily. 07/06/18   [provider]  Travoprost (TRAVATAN Z OP) Apply to eye. Use one drop in each eye daily    [provider]    Allergies    Atorvastatin and Rosuvastatin  Review of Systems   Review of Systems  Constitutional: Negative for activity change.  HENT: Negative for congestion.   Respiratory: Negative for shortness of breath.   Cardiovascular: Negative for chest pain.  Gastrointestinal: Negative for abdominal pain.  Genitourinary: Negative for dysuria, hematuria and pelvic pain.  Musculoskeletal: Positive for back pain.  Skin: Negative for rash.  Neurological: Negative for weakness and numbness.  Psychiatric/Behavioral: Negative for confusion.    Physical Exam Updated Vital Signs BP (!) 160/86   Pulse 74   Temp 98.8 F (37.1 C) (Oral)   Resp 16   Ht 5\' 6"  (1.676 m)   Wt 88.5 kg   SpO2 95%   BMI 31.47 kg/m   Physical Exam Vitals and nursing note reviewed.  Constitutional:      Comments: Patient sitting still  in the chair.  Slightly angled over to the right side.  HENT:     Head: Normocephalic.  Cardiovascular:     Rate and Rhythm: Regular rhythm.  Pulmonary:     Breath sounds: No wheezing, rhonchi or rales.  Abdominal:     Tenderness: There is no abdominal tenderness.  Musculoskeletal:     Cervical back: Neck supple.     Comments: Tenderness over the left lower paraspinal musculature.  No rash.  No deformity.  No midline tenderness.  Perineal  sensation intact.  Strength and sensation intact bilateral lower legs.  Skin:    General: Skin is warm.  Neurological:     Mental Status: She is alert and oriented to person, place, and time.     ED Results / Procedures / Treatments   Labs (all labs ordered are listed, but only abnormal results are displayed) Labs Reviewed - No data to display  EKG None  Radiology No results found.  Procedures Procedures (including critical care time)  Medications Ordered in ED Medications  ketorolac (TORADOL) 30 MG/ML injection 15 mg (15 mg Intramuscular Given 10/08/19 1058)    ED Course  I have reviewed the triage vital signs and the nursing notes.  Pertinent labs & imaging results that were available during my care of the patient were reviewed by me and considered in my medical decision making (see chart for details).    MDM Rules/Calculators/A&P                       Patient with low back pain.  Acute on chronic.  No new injury.  No relief with medicines at home.  Will treat with muscle relaxers and a shot of Toradol here.  Previous kidney function reviewed.  Discussed with patient about steroids.  With a history of glaucoma prescription was written for prednisone for 3 days, but she will talk with her ophthalmologist tomorrow and see how she is doing.  If pain relieved may not need the medication.  If pain still is bothering her may talk to her ophthalmologist about whether she would consider the steroids safe with the glaucoma.  Will also  follow-up with orthopedic surgery Final Clinical Impression(s) / ED Diagnoses Final diagnoses:  Left-sided low back pain without sciatica, unspecified chronicity    Rx / DC Orders ED Discharge Orders         Ordered    methocarbamol (ROBAXIN) 500 MG tablet  Every 8 hours PRN     10/08/19 1053    predniSONE (DELTASONE) 20 MG tablet  Daily     10/08/19 1053           Davonna Belling, MD 10/08/19 1105

## 2019-10-08 NOTE — ED Triage Notes (Signed)
Patient c/o bilateral low back pain. Patient denies any radiation of pain down her legs. Patient states she has chronic back pain,but "something is different."

## 2019-10-13 ENCOUNTER — Other Ambulatory Visit: Payer: Self-pay | Admitting: Interventional Cardiology

## 2019-11-05 NOTE — Progress Notes (Signed)
Cardiology Office Note   Date:  11/06/2019   ID:  [name], [name] 08/03/1951, MRN [id number]  PCP:  Christain Sacramento, MD    No chief complaint on file.  Family h/o CAD  Wt Readings from Last 3 Encounters:  11/06/19 203 lb 6.4 oz (92.3 kg)  10/08/19 195 lb (88.5 kg)  08/15/18 199 lb 3.2 oz (90.4 kg)       History of Present Illness: Carol Cox is a 69 y.o. female  who has a strong family history of coronary artery disease including her Dad at age 5, a sister at 69 with MI, maternal uncle with MI in his 106s. She had chest discomfort in 2014. This resulted in a heart catheterization which showed mild, nonobstructive coronary artery disease. She was thought to have non-cardiac chest pain.  SHe has had knee surgery a few years ago and then shoulder surgery more recently. She has ankle tendinitis as well.  Stationary bike has worsened knee pain.  Since the last visit, she has developed worsening spinal stenosis and will need surgery.  Walking has been limited.   Denies : Chest pain. Dizziness. Leg edema. Nitroglycerin use. Orthopnea. Palpitations. Paroxysmal nocturnal dyspnea. Shortness of breath. Syncope.   Back pain has been biggest issue.  No change in Family history.     Past Medical History:  Diagnosis Date  . Allergy    seasonal  . Arthritis    "knee, left hip, ankles"   . Borderline glaucoma   . CAD (coronary artery disease)    Mild nonobstructive CAD by cath 02/06/13 (25% mid LAD), normal EF  . Family history of adverse reaction to anesthesia    mother has problems with nausea and vomiting   . Glaucoma   . History of colitis    02/ 2016  acute infectious colitis-- resolved  . Hyperlipidemia   . Left ovarian cyst   . Osteoporosis    ospenia of hips  . PONV (postoperative nausea and vomiting)    SEVERE    Past Surgical History:  Procedure Laterality Date  . CARPAL TUNNEL RELEASE Bilateral 1990's  . KNEE ARTHROSCOPY W/ MENISCECTOMY Left  07/2014  . LEFT HEART CATHETERIZATION WITH CORONARY ANGIOGRAM N/A 02/06/2013   Procedure: LEFT HEART CATHETERIZATION WITH CORONARY ANGIOGRAM;  Surgeon: [name] Hampshire, MD;  Location: [facility];  Service: Cardiovascular;  Laterality: N/A;  non-obstruction 25% mLAD,  normal LVF , ef 65-70%  . left knee meniscus surgery     . NASAL SINUS SURGERY    . ovarian cyst     left ovary  . ROTATOR CUFF REPAIR Right 10/2016  . TOTAL KNEE ARTHROPLASTY Left 03/26/2015   Procedure: LEFT TOTAL KNEE ARTHROPLASTY;  Surgeon: Paralee Cancel, MD;  Location: [facility];  Service: Orthopedics;  Laterality: Left;     Current Outpatient Medications  Medication Sig Dispense Refill  . aspirin EC 81 MG tablet Take 81 mg by mouth daily.    . Calcium Carbonate-Vit D-Min (CALCIUM 1200 PO) Take by mouth daily.    . Calcium-Magnesium-Vitamin D (CALCIUM 1200+D3 PO) Take 1 tablet by mouth daily.    Marland Kitchen CALCIUM-MAGNESIUM-VITAMIN D PO Take 1 tablet by mouth daily.    . Cholecalciferol (VITAMIN D-3) 1000 units CAPS Take 1 capsule by mouth daily. Take 5000 units daily    . Coenzyme Q10 (COQ10 PO) Take 1 capsule by mouth daily.    Marland Kitchen ezetimibe-simvastatin (VYTORIN) 10-40 MG tablet TAKE 1 TABLET BY MOUTH EVERYDAY AT  BEDTIME 90 tablet 0  . fish oil-omega-3 fatty acids 1000 MG capsule Take 1 g by mouth daily.      Marland Kitchen gabapentin (NEURONTIN) 100 MG capsule Take 100 mg by mouth 3 (three) times daily.    Marland Kitchen ibuprofen (ADVIL,MOTRIN) 600 MG tablet Take 1 tablet (600 mg total) by mouth every 6 (six) hours as needed. 30 tablet 0  . METROGEL 1 % gel Apply 1 application topically daily.     . Multiple Vitamin (MULTIVITAMIN) tablet Take 1 tablet by mouth daily.      . psyllium (METAMUCIL) 58.6 % powder Take 1 packet by mouth as needed.    . timolol (TIMOPTIC) 0.5 % ophthalmic solution Place 1 drop into both eyes 2 (two) times daily.  0  . Travoprost (TRAVATAN Z OP) Apply to eye. Use one drop in each eye daily     Current Facility-Administered  Medications  Medication Dose Route Frequency Provider Last Rate Last Admin  . 0.9 %  sodium chloride infusion  500 mL Intravenous Continuous Irene Shipper, MD        Allergies:   Atorvastatin and Rosuvastatin    Social History:  The patient  reports that she has never smoked. She has never used smokeless tobacco. She reports that she does not drink alcohol or use drugs.   Family History:  The patient's family history includes CAD in her sister; CAD (age of onset: 29) in her maternal uncle; CAD (age of onset: 26) in her maternal uncle; CVA in her mother; Heart attack in her father; Heart attack (age of onset: 48) in her maternal uncle; Heart attack (age of onset: 64) in her maternal uncle; Heart attack (age of onset: 84) in her sister; Sudden death (age of onset: 85) in her father.    ROS:  Please see the history of present illness.   Otherwise, review of systems are positive for back pain.   All other systems are reviewed and negative.    PHYSICAL EXAM: VS:  BP (!) 160/98   Pulse 69   Ht 5\' 6"  (1.676 m)   Wt 203 lb 6.4 oz (92.3 kg)   SpO2 99%   BMI 32.83 kg/m  , BMI Body mass index is 32.83 kg/m. GEN: Well nourished, well developed, in no acute distress  HEENT: normal  Neck: no JVD, carotid bruits, or masses Cardiac: RRR; no murmurs, rubs, or gallops,no edema  Respiratory:  clear to auscultation bilaterally, normal work of breathing GI: soft, nontender, nondistended, + BS MS: no deformity or atrophy  Skin: warm and dry, no rash Neuro:  Strength and sensation are intact Psych: euthymic mood, full affect   EKG:   The ekg ordered today demonstrates NSR, normal ECG   Recent Labs: No results found for requested labs within last 8760 hours.   Lipid Panel    Component Value Date/Time   CHOL 199 08/15/2018 1124   TRIG 182 (H) 08/15/2018 1124   HDL 50 08/15/2018 1124   CHOLHDL 4.0 08/15/2018 1124   CHOLHDL 4 04/22/2015 0909   VLDL 35.4 04/22/2015 0909   LDLCALC 113 (H)  08/15/2018 1124   LDLDIRECT 106.3 05/09/2013 0921     Other studies Reviewed: Additional studies/ records that were reviewed today with results demonstrating: 2019 labs reviewed.   ASSESSMENT AND PLAN:  1. CAD: No anginal sx.  RF modification.  Healthy diet recommended.  Decrease salt intake. 2. Hyperlipidemia: Check labs today.  Not checked since 2019. 3. FAmily h/o CAD: No  change from prior.  4. Elevated BP readings: Tried lifestyle modifications with ACE-I as the first drug of choice if BP stays high.  Was controlled in 05/2019.  On ER visit, it was in the 140s when she left.  Will start lisinopril 10 mg daily.  BMet in 1 week.   Current medicines are reviewed at length with the patient today.  The patient concerns regarding her medicines were addressed.  The following changes have been made:  As above  Labs/ tests ordered today include:  No orders of the defined types were placed in this encounter.   Recommend 150 minutes/week of aerobic exercise Low fat, low carb, high fiber diet recommended  Disposition:   FU in 2 weeks for BP check   Signed, Larae Grooms, MD  11/06/2019 9:53 AM    [facility] [address], [place], [place]  [address] Phone: [phone]; Fax: [phone]

## 2019-11-06 ENCOUNTER — Other Ambulatory Visit: Payer: Self-pay

## 2019-11-06 ENCOUNTER — Ambulatory Visit: Payer: Medicare PPO | Admitting: Interventional Cardiology

## 2019-11-06 ENCOUNTER — Encounter: Payer: Self-pay | Admitting: Interventional Cardiology

## 2019-11-06 VITALS — BP 160/98 | HR 69 | Ht 66.0 in | Wt 203.4 lb

## 2019-11-06 DIAGNOSIS — E782 Mixed hyperlipidemia: Secondary | ICD-10-CM | POA: Diagnosis not present

## 2019-11-06 DIAGNOSIS — Z8249 Family history of ischemic heart disease and other diseases of the circulatory system: Secondary | ICD-10-CM

## 2019-11-06 DIAGNOSIS — R030 Elevated blood-pressure reading, without diagnosis of hypertension: Secondary | ICD-10-CM

## 2019-11-06 DIAGNOSIS — I2510 Atherosclerotic heart disease of native coronary artery without angina pectoris: Secondary | ICD-10-CM

## 2019-11-06 LAB — COMPREHENSIVE METABOLIC PANEL
ALT: 28 IU/L (ref 0–32)
AST: 25 IU/L (ref 0–40)
Albumin/Globulin Ratio: 1.7 (ref 1.2–2.2)
Albumin: 4.2 g/dL (ref 3.8–4.8)
Alkaline Phosphatase: 72 IU/L (ref 39–117)
BUN/Creatinine Ratio: 20 (ref 12–28)
BUN: 18 mg/dL (ref 8–27)
Bilirubin Total: 0.4 mg/dL (ref 0.0–1.2)
CO2: 23 mmol/L (ref 20–29)
Calcium: 9.6 mg/dL (ref 8.7–10.3)
Chloride: 106 mmol/L (ref 96–106)
Creatinine, Ser: 0.9 mg/dL (ref 0.57–1.00)
GFR calc Af Amer: 77 mL/min/{1.73_m2} (ref >59)
GFR calc non Af Amer: 66 mL/min/{1.73_m2} (ref >59)
Globulin, Total: 2.5 g/dL (ref 1.5–4.5)
Glucose: 102 mg/dL — ABNORMAL HIGH (ref 65–99)
Potassium: 4.6 mmol/L (ref 3.5–5.2)
Sodium: 142 mmol/L (ref 134–144)
Total Protein: 6.7 g/dL (ref 6.0–8.5)

## 2019-11-06 LAB — LIPID PANEL
Chol/HDL Ratio: 3 ratio (ref 0.0–4.4)
Cholesterol, Total: 153 mg/dL (ref 100–199)
HDL: 51 mg/dL (ref >39)
LDL Chol Calc (NIH): 77 mg/dL (ref 0–99)
Triglycerides: 145 mg/dL (ref 0–149)
VLDL Cholesterol Cal: 25 mg/dL (ref 5–40)

## 2019-11-06 MED ORDER — LISINOPRIL 10 MG PO TABS: 10.0000 mg | ORAL_TABLET | Freq: Every day | ORAL | 3 refills | Status: DC

## 2019-11-06 NOTE — Patient Instructions (Signed)
Medication Instructions:  Your physician has recommended you make the following change in your medication:   START: lisinopril 10 mg once a day   If you need a refill on your cardiac medications before your next appointment, please call your pharmacy.   Lab work: TODAY: CMET, LIPIDS  Your physician recommends that you return for BMET in 2 weeks (same day as Hypertension Clinic appointment)   If you have labs (blood work) drawn today and your tests are completely normal, you will receive your results only by: Marland Kitchen MyChart Message (if you have MyChart) OR . A paper copy in the mail If you have any lab test that is abnormal or we need to change your treatment, we will call you to review the results.  Testing/Procedures: None ordered  Follow-Up: . Follow up in the Hypertension Clinic in 2 weeks  Any Other Special Instructions Will Be Listed Below (If Applicable).

## 2019-11-08 ENCOUNTER — Other Ambulatory Visit: Payer: Self-pay

## 2019-11-08 ENCOUNTER — Ambulatory Visit: Payer: Medicare PPO | Attending: Orthopedic Surgery | Admitting: Physical Therapy

## 2019-11-08 DIAGNOSIS — M545 Low back pain, unspecified: Secondary | ICD-10-CM

## 2019-11-08 NOTE — Therapy (Signed)
[facility] [place], [place], [address] Phone: [phone]   Fax:  [phone]  Physical Therapy Evaluation  Patient Details  Name: Carol Cox MRN: [id number] Date of Birth: 1951/08/15 Referring Provider (PT): Netta Cedars MD.   Encounter Date: 11/08/2019  PT End of Session - 11/08/19 1500    Visit Number  1    Number of Visits  12    Date for PT Re-Evaluation  12/06/19    Authorization Type  FOTO.    PT Start Time  0102    PT Stop Time  0158    PT Time Calculation (min)  56 min    Activity Tolerance  Patient tolerated treatment well    Behavior During Therapy  WFL for tasks assessed/performed       Past Medical History:  Diagnosis Date  . Allergy    seasonal  . Arthritis    "knee, left hip, ankles"   . Borderline glaucoma   . CAD (coronary artery disease)    Mild nonobstructive CAD by cath 02/06/13 (25% mid LAD), normal EF  . Family history of adverse reaction to anesthesia    mother has problems with nausea and vomiting   . Glaucoma   . History of colitis    02/ 2016  acute infectious colitis-- resolved  . Hyperlipidemia   . Left ovarian cyst   . Osteoporosis    ospenia of hips  . PONV (postoperative nausea and vomiting)    SEVERE    Past Surgical History:  Procedure Laterality Date  . CARPAL TUNNEL RELEASE Bilateral 1990's  . KNEE ARTHROSCOPY W/ MENISCECTOMY Left 07/2014  . LEFT HEART CATHETERIZATION WITH CORONARY ANGIOGRAM N/A 02/06/2013   Procedure: LEFT HEART CATHETERIZATION WITH CORONARY ANGIOGRAM;  Surgeon: [name] Hampshire, MD;  Location: [facility];  Service: Cardiovascular;  Laterality: N/A;  non-obstruction 25% mLAD,  normal LVF , ef 65-70%  . left knee meniscus surgery     . NASAL SINUS SURGERY    . ovarian cyst     left ovary  . ROTATOR CUFF REPAIR Right 10/2016  . TOTAL KNEE ARTHROPLASTY Left 03/26/2015   Procedure: LEFT TOTAL KNEE ARTHROPLASTY;  Surgeon: Paralee Cancel, MD;   Location: [facility];  Service: Orthopedics;  Laterality: Left;    There were no vitals filed for this visit.   Subjective Assessment - 11/08/19 1515    Subjective  COVID-19 screen performed prior to patient entering clinic.  The patient states that in October of 2020 she had an onset of severe low back pain such that she was unable to get out of bed.  Her husband took her to an ED.  Her pain has improved somewhat but is still quite bad.  At rest today her pain is a 4/10 but her pain easily rises to [id number] with certain movements and increased standing.  She states she is a likely surgical candidate.    Pertinent History  Right RTC repair, Left TKA, CTS.    How long can you stand comfortably?  10 minutes.    Diagnostic tests  Short community distances.    Patient Stated Goals  get out of pain and avoid surgery.    Currently in Pain?  Yes    Pain Score  4     Pain Location  Back    Pain Orientation  Right;Left;Mid    Pain Descriptors / Indicators  Aching;Shooting    Pain Type  Acute pain    Pain Radiating  Towards  Right hip.    Pain Onset  More than a month ago    Pain Frequency  Constant    Aggravating Factors   See above.    Pain Relieving Factors  See above.         [facility] PT Assessment - 11/08/19 0001      Assessment   Medical Diagnosis  Spinal stenosis, lumbar region.    Referring Provider (PT)  Netta Cedars MD.    Onset Date/Surgical Date  --   October 2020.     Precautions   Precautions  None      Restrictions   Weight Bearing Restrictions  No      Balance Screen   Has the patient fallen in the past 6 months  No    Has the patient had a decrease in activity level because of a fear of falling?   No    Is the patient reluctant to leave their home because of a fear of falling?   No      Home Environment   Living Environment  Private residence      Prior Function   Level of Independence  Independent      Observation/Other Assessments   Focus on Therapeutic Outcomes  (FOTO)   53% limitation.      Posture/Postural Control   Posture/Postural Control  Postural limitations    Postural Limitations  Rounded Shoulders;Forward head      Deep Tendon Reflexes   DTR Assessment Site  Patella;Achilles    Patella DTR  0    Achilles DTR  0      ROM / Strength   AROM / PROM / Strength  AROM      AROM   Overall AROM Comments  20 degrees of active lumbar extension and a 25% flexion deficit.      Palpation   Palpation comment  Very tender to palption over right SIJ.  Diffuse pain reported from L4 to S1 and across bilaterally from these vertebral segments.      Special Tests   Other special tests  (=) leg lengths, mild pain reprodution with SLR testing.  (-) FABER testing.      Ambulation/Gait   Gait Comments  The patient walks slowly and cautiously in obvious pain.                Objective measurements completed on examination: See above findings.      [facility] Adult PT Treatment/Exercise - 11/08/19 0001      Modalities   Modalities  Electrical Stimulation;Moist Heat      Moist Heat Therapy   Number Minutes Moist Heat  20 Minutes    Moist Heat Location  Lumbar Spine      Electrical Stimulation   Electrical Stimulation Location  Lower lumbar.    Electrical Stimulation Action  IFC    Electrical Stimulation Parameters  80-150 Hz x 20 minutes.    Electrical Stimulation Goals  Pain               PT Short Term Goals - 11/08/19 1621      PT SHORT TERM GOAL #1   Title  STG's=LTG's        PT Long Term Goals - 11/08/19 1621      PT LONG TERM GOAL #1   Title  Ind with HEP.    Time  4    Period  Weeks    Status  New  PT LONG TERM GOAL #2   Title  Stand 20 minutes with pain not > 3/10.    Time  4    Period  Weeks    Status  New      PT LONG TERM GOAL #3   Title  Perform ADL's with pain not > 3/10.    Time  4    Period  Weeks    Status  New      PT LONG TERM GOAL #4   Title  Eliminate radiation of pain into right hip  region.    Time  4    Period  Weeks    Status  New             Plan - 11/08/19 1542    Clinical Impression Statement  The patient presents to OPPT with c/o low back pain with radiation into her right hip region.  She reported a severe onset of low back pain for no apparent reason in October of 2020 such that she had to present to an ED.  The patient's functional mobility is impaired due to pain.  She demonstrates a FOTO limitation score of 53%.  She has minimal lumbar range of motion deficits.  She was very tender to palption over her right SIJ and c/o diffuse bilateral low back pain from L4 to S1.  Patient will benefit from skilled physical therapy intervention to address deficits and pain.    Personal Factors and Comorbidities  Comorbidity 1;Comorbidity 2    Comorbidities  Right RTC repair, Left TKA, CTS.    Examination-Activity Limitations  Stand;Locomotion Level;Other    Examination-Participation Restrictions  Cleaning;Other    Stability/Clinical Decision Making  Evolving/Moderate complexity    Clinical Decision Making  Moderate    PT Frequency  3x / week    PT Duration  4 weeks    PT Treatment/Interventions  ADLs/Self Care Home Management;Cryotherapy;Electrical Stimulation;Traction;Ultrasound;Moist Heat;Therapeutic activities;Therapeutic exercise;Manual techniques;Patient/family education;Dry needling;Spinal Manipulations    PT Next Visit Plan  Combo e'stim/U/S, STW/M to affected low back/right SIJ.  S and DKTC, hip bridges, Nustep/Ellipitical.  Can try intermittment traction at 30% body weight.    Consulted and Agree with Plan of Care  Patient       Patient will benefit from skilled therapeutic intervention in order to improve the following deficits and impairments:  Pain, Decreased activity tolerance, Postural dysfunction, Decreased range of motion  Visit Diagnosis: Acute bilateral low back pain without sciatica - Plan: PT plan of care cert/re-cert     Problem  List Patient Active Problem List   Diagnosis Date Noted  . Family history of early CAD 07/15/2017  . Elevated blood pressure reading in office without diagnosis of hypertension 07/15/2017  . Obese 03/27/2015  . S/P left TKA 03/26/2015  . S/P knee replacement 03/26/2015  . Colitis, acute 12/01/2014  . Weight loss, unintentional 12/01/2014  . Adnexal cyst 12/01/2014  . Elevated fasting glucose 12/01/2014  . CAD (coronary artery disease) 12/01/2014  . Osteoarthritis 12/01/2014  . Vasovagal attack 02/28/2013  . Hyperlipidemia 02/28/2013    Jasmyn Picha, [place] MPT 11/08/2019, 4:25 PM  [facility] [address] [place], [place], [address] Phone: [phone]   Fax:  [phone]  Name: LILANA HOUT MRN: [id number] Date of Birth: January 20, 1951

## 2019-11-13 ENCOUNTER — Ambulatory Visit: Payer: Medicare PPO | Admitting: Physical Therapy

## 2019-11-13 ENCOUNTER — Other Ambulatory Visit: Payer: Self-pay

## 2019-11-13 DIAGNOSIS — M545 Low back pain, unspecified: Secondary | ICD-10-CM

## 2019-11-13 NOTE — Therapy (Signed)
[facility] [place], [place], [address] Phone: [phone]   Fax:  [phone]  Physical Therapy Treatment  Patient Details  Name: Carol Cox MRN: [id number] Date of Birth: 16-Oct-1951 Referring Provider (PT): Netta Cedars MD.   Encounter Date: 11/13/2019  PT End of Session - 11/13/19 1128    Visit Number  2    Number of Visits  12    Date for PT Re-Evaluation  12/06/19    Authorization Type  FOTO.    PT Start Time  1033    PT Stop Time  1127    PT Time Calculation (min)  54 min    Activity Tolerance  Patient tolerated treatment well    Behavior During Therapy  WFL for tasks assessed/performed       Past Medical History:  Diagnosis Date  . Allergy    seasonal  . Arthritis    "knee, left hip, ankles"   . Borderline glaucoma   . CAD (coronary artery disease)    Mild nonobstructive CAD by cath 02/06/13 (25% mid LAD), normal EF  . Family history of adverse reaction to anesthesia    mother has problems with nausea and vomiting   . Glaucoma   . History of colitis    02/ 2016  acute infectious colitis-- resolved  . Hyperlipidemia   . Left ovarian cyst   . Osteoporosis    ospenia of hips  . PONV (postoperative nausea and vomiting)    SEVERE    Past Surgical History:  Procedure Laterality Date  . CARPAL TUNNEL RELEASE Bilateral 1990's  . KNEE ARTHROSCOPY W/ MENISCECTOMY Left 07/2014  . LEFT HEART CATHETERIZATION WITH CORONARY ANGIOGRAM N/A 02/06/2013   Procedure: LEFT HEART CATHETERIZATION WITH CORONARY ANGIOGRAM;  Surgeon: [name] Hampshire, MD;  Location: [facility];  Service: Cardiovascular;  Laterality: N/A;  non-obstruction 25% mLAD,  normal LVF , ef 65-70%  . left knee meniscus surgery     . NASAL SINUS SURGERY    . ovarian cyst     left ovary  . ROTATOR CUFF REPAIR Right 10/2016  . TOTAL KNEE ARTHROPLASTY Left 03/26/2015   Procedure: LEFT TOTAL KNEE ARTHROPLASTY;  Surgeon: Paralee Cancel, MD;  Location:  [facility];  Service: Orthopedics;  Laterality: Left;    There were no vitals filed for this visit.  Subjective Assessment - 11/13/19 1056    Subjective  COVID-19 screen performed prior to patient entering clinic.  No new complaints.    Pertinent History  Right RTC repair, Left TKA, CTS.    How long can you stand comfortably?  10 minutes.    Diagnostic tests  Short community distances.    Patient Stated Goals  get out of pain and avoid surgery.    Currently in Pain?  Yes    Pain Score  4     Pain Location  Back    Pain Orientation  Right;Left;Mid    Pain Descriptors / Indicators  Aching;Shooting    Pain Type  Acute pain    Pain Onset  More than a month ago                       [facility] Adult PT Treatment/Exercise - 11/13/19 0001      Modalities   Modalities  Electrical Stimulation;Moist Heat;Traction      Acupuncturist Location  Lumbar spine    Electrical Stimulation Action  Pre-mod.    Dealer  Stimulation Parameters  80-150 Hz x 15 minutes.    Electrical Stimulation Goals  Pain      Traction   Type of Traction  Lumbar    Min (lbs)  5    Max (lbs)  60    Hold Time  99    Rest Time  5    Time  15      Manual Therapy   Manual Therapy  Soft tissue mobilization    Soft tissue mobilization  STW/M x 8 minutes to right SIJ region.               PT Short Term Goals - 11/08/19 1621      PT SHORT TERM GOAL #1   Title  STG's=LTG's        PT Long Term Goals - 11/08/19 1621      PT LONG TERM GOAL #1   Title  Ind with HEP.    Time  4    Period  Weeks    Status  New      PT LONG TERM GOAL #2   Title  Stand 20 minutes with pain not > 3/10.    Time  4    Period  Weeks    Status  New      PT LONG TERM GOAL #3   Title  Perform ADL's with pain not > 3/10.    Time  4    Period  Weeks    Status  New      PT LONG TERM GOAL #4   Title  Eliminate radiation of pain into right hip region.    Time  4    Period   Weeks    Status  New            Plan - 11/13/19 1131    Clinical Impression Statement  Patient vey tender over right SIJ.  She did well with STW/M to this region.  Patient tolerated treatment without complaint.    Personal Factors and Comorbidities  Comorbidity 1;Comorbidity 2    Comorbidities  Right RTC repair, Left TKA, CTS.    Examination-Activity Limitations  Stand;Locomotion Level;Other    Examination-Participation Restrictions  Cleaning;Other    Stability/Clinical Decision Making  Evolving/Moderate complexity    PT Frequency  3x / week    PT Duration  4 weeks    PT Treatment/Interventions  ADLs/Self Care Home Management;Cryotherapy;Electrical Stimulation;Traction;Ultrasound;Moist Heat;Therapeutic activities;Therapeutic exercise;Manual techniques;Patient/family education;Dry needling;Spinal Manipulations       Patient will benefit from skilled therapeutic intervention in order to improve the following deficits and impairments:  Pain, Decreased activity tolerance, Postural dysfunction, Decreased range of motion  Visit Diagnosis: Acute bilateral low back pain without sciatica     Problem List Patient Active Problem List   Diagnosis Date Noted  . Family history of early CAD 07/15/2017  . Elevated blood pressure reading in office without diagnosis of hypertension 07/15/2017  . Obese 03/27/2015  . S/P left TKA 03/26/2015  . S/P knee replacement 03/26/2015  . Colitis, acute 12/01/2014  . Weight loss, unintentional 12/01/2014  . Adnexal cyst 12/01/2014  . Elevated fasting glucose 12/01/2014  . CAD (coronary artery disease) 12/01/2014  . Osteoarthritis 12/01/2014  . Vasovagal attack 02/28/2013  . Hyperlipidemia 02/28/2013    Adalynd Donahoe, [place] MPT 11/13/2019, 11:35 AM  [facility] [address] [place], [place], [address] Phone: [phone]   Fax:  [phone]  Name: Carol Cox MRN: [id number] Date of Birth:  May 03, 1951

## 2019-11-14 ENCOUNTER — Other Ambulatory Visit: Payer: Self-pay | Admitting: Orthopedic Surgery

## 2019-11-15 ENCOUNTER — Other Ambulatory Visit: Payer: Self-pay

## 2019-11-15 ENCOUNTER — Other Ambulatory Visit: Payer: Self-pay | Admitting: Orthopedic Surgery

## 2019-11-15 ENCOUNTER — Ambulatory Visit: Payer: Medicare PPO | Admitting: Physical Therapy

## 2019-11-15 DIAGNOSIS — G8929 Other chronic pain: Secondary | ICD-10-CM

## 2019-11-15 DIAGNOSIS — M545 Low back pain, unspecified: Secondary | ICD-10-CM

## 2019-11-15 NOTE — Therapy (Signed)
[facility] [place], [place], [address] Phone: [phone]   Fax:  [phone]  Physical Therapy Evaluation  Patient Details  Name: Carol Cox MRN: [id number] Date of Birth: 06/26/1951 Referring Provider (PT): Netta Cedars MD.   Encounter Date: 11/15/2019  PT End of Session - 11/15/19 1220    Visit Number  3    Number of Visits  12    Date for PT Re-Evaluation  12/06/19    Authorization Type  FOTO.    PT Start Time  1030    PT Stop Time  1122    PT Time Calculation (min)  52 min    Activity Tolerance  Patient tolerated treatment well    Behavior During Therapy  WFL for tasks assessed/performed       Past Medical History:  Diagnosis Date  . Allergy    seasonal  . Arthritis    "knee, left hip, ankles"   . Borderline glaucoma   . CAD (coronary artery disease)    Mild nonobstructive CAD by cath 02/06/13 (25% mid LAD), normal EF  . Family history of adverse reaction to anesthesia    mother has problems with nausea and vomiting   . Glaucoma   . History of colitis    02/ 2016  acute infectious colitis-- resolved  . Hyperlipidemia   . Left ovarian cyst   . Osteoporosis    ospenia of hips  . PONV (postoperative nausea and vomiting)    SEVERE    Past Surgical History:  Procedure Laterality Date  . CARPAL TUNNEL RELEASE Bilateral 1990's  . KNEE ARTHROSCOPY W/ MENISCECTOMY Left 07/2014  . LEFT HEART CATHETERIZATION WITH CORONARY ANGIOGRAM N/A 02/06/2013   Procedure: LEFT HEART CATHETERIZATION WITH CORONARY ANGIOGRAM;  Surgeon: [name] Hampshire, MD;  Location: [facility];  Service: Cardiovascular;  Laterality: N/A;  non-obstruction 25% mLAD,  normal LVF , ef 65-70%  . left knee meniscus surgery     . NASAL SINUS SURGERY    . ovarian cyst     left ovary  . ROTATOR CUFF REPAIR Right 10/2016  . TOTAL KNEE ARTHROPLASTY Left 03/26/2015   Procedure: LEFT TOTAL KNEE ARTHROPLASTY;  Surgeon: Paralee Cancel, MD;   Location: [facility];  Service: Orthopedics;  Laterality: Left;    There were no vitals filed for this visit.   Subjective Assessment - 11/15/19 1036    Subjective  COVID-19 screen performed prior to patient entering clinic. Patient reports feeling a little more discomfort. Will be getting an injection on 11/24/2019.    Pertinent History  Right RTC repair, Left TKA, CTS.    How long can you stand comfortably?  10 minutes.    Diagnostic tests  Short community distances.    Patient Stated Goals  get out of pain and avoid surgery.    Currently in Pain?  Yes    Pain Score  5     Pain Location  Back    Pain Orientation  Right;Left    Pain Descriptors / Indicators  Aching;Shooting    Pain Type  Acute pain    Pain Onset  More than a month ago    Pain Frequency  Constant         [facility] PT Assessment - 11/15/19 0001      Assessment   Medical Diagnosis  Spinal stenosis, lumbar region.    Referring Provider (PT)  Netta Cedars MD.      Precautions   Precautions  None  Restrictions   Weight Bearing Restrictions  No                Objective measurements completed on examination: See above findings.      [facility] Adult PT Treatment/Exercise - 11/15/19 0001      Modalities   Modalities  Electrical Stimulation;Moist Heat;Traction      Moist Heat Therapy   Number Minutes Moist Heat  10 Minutes    Moist Heat Location  Lumbar Spine      Electrical Stimulation   Electrical Stimulation Location  Lumbar spine    Electrical Stimulation Action  pre-mod    Electrical Stimulation Parameters  80-150 hz x10 mins    Electrical Stimulation Goals  Pain      Traction   Type of Traction  Lumbar    Min (lbs)  5    Max (lbs)  60    Hold Time  99    Rest Time  5    Time  15      Manual Therapy   Manual Therapy  Soft tissue mobilization    Soft tissue mobilization  STW/M x 12 minutes to right SIJ region.               PT Short Term Goals - 11/08/19 1621      PT SHORT  TERM GOAL #1   Title  STG's=LTG's        PT Long Term Goals - 11/08/19 1621      PT LONG TERM GOAL #1   Title  Ind with HEP.    Time  4    Period  Weeks    Status  New      PT LONG TERM GOAL #2   Title  Stand 20 minutes with pain not > 3/10.    Time  4    Period  Weeks    Status  New      PT LONG TERM GOAL #3   Title  Perform ADL's with pain not > 3/10.    Time  4    Period  Weeks    Status  New      PT LONG TERM GOAL #4   Title  Eliminate radiation of pain into right hip region.    Time  4    Period  Weeks    Status  New             Plan - 11/15/19 1221    Clinical Impression Statement  Patient tolerate treatment fairly well but with ongoing pain in bilateral lumbar paraspinals. Patient responded well to traction at 60# max pull. No adverse affects upon the removal of modalities.    Personal Factors and Comorbidities  Comorbidity 1;Comorbidity 2    Comorbidities  Right RTC repair, Left TKA, CTS.    Examination-Activity Limitations  Stand;Locomotion Level;Other    Examination-Participation Restrictions  Cleaning;Other    Stability/Clinical Decision Making  Evolving/Moderate complexity    Clinical Decision Making  Moderate    PT Frequency  3x / week    PT Duration  4 weeks    PT Treatment/Interventions  ADLs/Self Care Home Management;Cryotherapy;Electrical Stimulation;Traction;Ultrasound;Moist Heat;Therapeutic activities;Therapeutic exercise;Manual techniques;Patient/family education;Dry needling;Spinal Manipulations    PT Next Visit Plan  Combo e'stim/U/S, STW/M to affected low back/right SIJ.  S and DKTC, hip bridges, Nustep/Ellipitical.  Can try intermittment traction at 30% body weight.    Consulted and Agree with Plan of Care  Patient       Patient will  benefit from skilled therapeutic intervention in order to improve the following deficits and impairments:  Pain, Decreased activity tolerance, Postural dysfunction, Decreased range of motion  Visit  Diagnosis: Acute bilateral low back pain without sciatica     Problem List Patient Active Problem List   Diagnosis Date Noted  . Family history of early CAD 07/15/2017  . Elevated blood pressure reading in office without diagnosis of hypertension 07/15/2017  . Obese 03/27/2015  . S/P left TKA 03/26/2015  . S/P knee replacement 03/26/2015  . Colitis, acute 12/01/2014  . Weight loss, unintentional 12/01/2014  . Adnexal cyst 12/01/2014  . Elevated fasting glucose 12/01/2014  . CAD (coronary artery disease) 12/01/2014  . Osteoarthritis 12/01/2014  . Vasovagal attack 02/28/2013  . Hyperlipidemia 02/28/2013    Gabriela Eves, PT, DPT 11/15/2019, 12:46 PM  [facility] [address] [place], [place], [address] Phone: [phone]   Fax:  [phone]  Name: BROGAN THORBURN MRN: [id number] Date of Birth: [birth year]

## 2019-11-17 NOTE — Progress Notes (Signed)
Patient ID: Carol Cox                 DOB: October 04, 1951                      MRN: [id number]     HPI: Carol Cox is a 69 y.o. female referred by Dr. Irish Lack  to HTN clinic. PMH is significant for CAD (mild nonobstructive CAD by cath 01/2013 w/ 25% mid LAD and normal EF), strong family history of premature CAD, non-cardiac chest pain, glaucoma, arthritis, HLD, and osteoporosis. Patient was last seen by Dr. Irish Lack on 11/06/19. EKG normal. BP was elevated at 160/98 mmHg and pt was started on lisinopril 10 mg daily, recommended 150 minutes/week of aerobic exercise and a low fat, low carb, high fiber diet.  Pt presents today in good spirits. She is tolerating her lisinopril well. Denies dizziness, balance problems, and vision changes. Occasional dry cough but not bothersome. Mild headache for a few days after starting lisinopril which has since resolved. She has an automatic bicep cuff that she has been using to check her BP - lowest reading of 130/78, many in the 130s/80. Sister in law is a Marine scientist, checked her BP using manual cuff yesterday and reading was 135/81 - matched her automatic cuff well.  She takes ibuprofen 400mg  each night due to back pain. Tylenol does not work well. Uses ice pack during the day, limits ibuprofen to the evening. Stopped hydrocodone 3 weeks ago - this made her feel poorly. She is trying to stay as active as possible given her baseline back pain - is pending surgery for this (not scheduled yet).  Current HTN meds: lisinopril 10 mg daily in the morning  BP goal: <130/80 mmHg  Family History: The patient's family history includes CAD in her sister; CAD (age of onset: 71) in her maternal uncle; CAD (age of onset: 40) in her maternal uncle; CVA in her mother; Heart attack in her father; Heart attack (age of onset: 21) in her maternal uncle; Heart attack (age of onset: 3) in her maternal uncle; Heart attack (age of onset: 61) in her sister; Sudden death (age of onset: 56)  in her father  Social History: no tobacco, alcohol, or tobacco use  Diet: 1-2 cups of coffee in the AM, trying to drink more water. Avoids soda. Trying to eat more vegetables and fiber, less sugar.  Exercise: Using her elliptical a bit more. Does have spinal stenosis and back pain that limit her activity.  Home BP readings:   Wt Readings from Last 3 Encounters:  11/06/19 203 lb 6.4 oz (92.3 kg)  10/08/19 195 lb (88.5 kg)  08/15/18 199 lb 3.2 oz (90.4 kg)   BP Readings from Last 3 Encounters:  11/06/19 (!) 160/98  10/08/19 140/90  08/15/18 132/90   Pulse Readings from Last 3 Encounters:  11/06/19 69  10/08/19 66  08/15/18 67    Renal function: Estimated Creatinine Clearance: 69.4 mL/min (by C-G formula based on SCr of 0.9 mg/dL).  Past Medical History:  Diagnosis Date  . Allergy    seasonal  . Arthritis    "knee, left hip, ankles"   . Borderline glaucoma   . CAD (coronary artery disease)    Mild nonobstructive CAD by cath 02/06/13 (25% mid LAD), normal EF  . Family history of adverse reaction to anesthesia    mother has problems with nausea and vomiting   . Glaucoma   . History of  colitis    02/ 2016  acute infectious colitis-- resolved  . Hyperlipidemia   . Left ovarian cyst   . Osteoporosis    ospenia of hips  . PONV (postoperative nausea and vomiting)    SEVERE    Current Outpatient Medications on File Prior to Visit  Medication Sig Dispense Refill  . aspirin EC 81 MG tablet Take 81 mg by mouth daily.    . Calcium Carbonate-Vit D-Min (CALCIUM 1200 PO) Take by mouth daily.    . Calcium-Magnesium-Vitamin D (CALCIUM 1200+D3 PO) Take 1 tablet by mouth daily.    Marland Kitchen CALCIUM-MAGNESIUM-VITAMIN D PO Take 1 tablet by mouth daily.    . Cholecalciferol (VITAMIN D-3) 1000 units CAPS Take 1 capsule by mouth daily. Take 5000 units daily    . Coenzyme Q10 (COQ10 PO) Take 1 capsule by mouth daily.    Marland Kitchen ezetimibe-simvastatin (VYTORIN) 10-40 MG tablet TAKE 1 TABLET BY MOUTH  EVERYDAY AT BEDTIME 90 tablet 0  . fish oil-omega-3 fatty acids 1000 MG capsule Take 1 g by mouth daily.      Marland Kitchen gabapentin (NEURONTIN) 100 MG capsule Take 100 mg by mouth 3 (three) times daily.    Marland Kitchen ibuprofen (ADVIL,MOTRIN) 600 MG tablet Take 1 tablet (600 mg total) by mouth every 6 (six) hours as needed. 30 tablet 0  . lisinopril (ZESTRIL) 10 MG tablet Take 1 tablet (10 mg total) by mouth daily. 90 tablet 3  . METROGEL 1 % gel Apply 1 application topically daily.     . Multiple Vitamin (MULTIVITAMIN) tablet Take 1 tablet by mouth daily.      . psyllium (METAMUCIL) 58.6 % powder Take 1 packet by mouth as needed.    . timolol (TIMOPTIC) 0.5 % ophthalmic solution Place 1 drop into both eyes 2 (two) times daily.  0  . Travoprost (TRAVATAN Z OP) Apply to eye. Use one drop in each eye daily     Current Facility-Administered Medications on File Prior to Visit  Medication Dose Route Frequency Provider Last Rate Last Admin  . 0.9 %  sodium chloride infusion  500 mL Intravenous Continuous Irene Shipper, MD        Allergies  Allergen Reactions  . Atorvastatin Other (See Comments)    Leg pain Leg pain Leg pain Leg pain Leg pain   . Rosuvastatin Other (See Comments)    Leg pain Leg pain Leg pain Leg pain Leg pain      Assessment/Plan:  1. Hypertension - BP improved but remains above goal of <130/80 mmHg. BMET checked today and is stable, will increase lisinopril to 20mg  daily. Encouraged pt to limit sodium intake to < 2,000mg  daily. Back pain, NSAID use, and more limited mobility are all contributing to elevated BP as well. Hopefully after back surgery BP readings will improve. F/u in HTN clinic in 3 weeks for BP check and BMET. Encouraged pt to bring home cuff and readings to this appt.  Pt seen with Julieta Bellini, PharmD Candidate and Eddie Candle, PharmD PGY1 resident  Harlon Flor. Supple, PharmD, BCACP, [id number], [place], [place]  [address] Phone: [phone]; Fax: [phone] 11/20/2019 10:48 AM

## 2019-11-20 ENCOUNTER — Other Ambulatory Visit: Payer: Medicare PPO | Admitting: *Deleted

## 2019-11-20 ENCOUNTER — Ambulatory Visit (INDEPENDENT_AMBULATORY_CARE_PROVIDER_SITE_OTHER): Payer: Medicare PPO | Admitting: Pharmacist

## 2019-11-20 ENCOUNTER — Other Ambulatory Visit: Payer: Self-pay

## 2019-11-20 ENCOUNTER — Ambulatory Visit: Payer: Medicare PPO | Admitting: Physical Therapy

## 2019-11-20 DIAGNOSIS — Z8249 Family history of ischemic heart disease and other diseases of the circulatory system: Secondary | ICD-10-CM

## 2019-11-20 DIAGNOSIS — M545 Low back pain, unspecified: Secondary | ICD-10-CM

## 2019-11-20 DIAGNOSIS — E782 Mixed hyperlipidemia: Secondary | ICD-10-CM

## 2019-11-20 DIAGNOSIS — I2510 Atherosclerotic heart disease of native coronary artery without angina pectoris: Secondary | ICD-10-CM

## 2019-11-20 DIAGNOSIS — I10 Essential (primary) hypertension: Secondary | ICD-10-CM | POA: Insufficient documentation

## 2019-11-20 DIAGNOSIS — R030 Elevated blood-pressure reading, without diagnosis of hypertension: Secondary | ICD-10-CM

## 2019-11-20 LAB — BASIC METABOLIC PANEL
BUN/Creatinine Ratio: 26 (ref 12–28)
BUN: 21 mg/dL (ref 8–27)
CO2: 24 mmol/L (ref 20–29)
Calcium: 9.7 mg/dL (ref 8.7–10.3)
Chloride: 106 mmol/L (ref 96–106)
Creatinine, Ser: 0.8 mg/dL (ref 0.57–1.00)
GFR calc Af Amer: 88 mL/min/{1.73_m2} (ref >59)
GFR calc non Af Amer: 77 mL/min/{1.73_m2} (ref >59)
Glucose: 72 mg/dL (ref 65–99)
Potassium: 4.1 mmol/L (ref 3.5–5.2)
Sodium: 143 mmol/L (ref 134–144)

## 2019-11-20 MED ORDER — LISINOPRIL 20 MG PO TABS: 20.0000 mg | ORAL_TABLET | Freq: Every day | ORAL | 11 refills | Status: DC

## 2019-11-20 NOTE — Patient Instructions (Addendum)
It was nice to meet you today  Your blood pressure goal is less than 130/27mmHg  Limit your daily sodium intake to less than 2,000mg  daily  If your lab work is stable today, we will plan to increase your lisinopril to 20mg  once daily and recheck blood pressure/labs in the next 2-3 weeks. We will call to schedule this follow up when we review your lab results

## 2019-11-20 NOTE — Therapy (Signed)
[facility] [place], [place], [address] Phone: [phone]   Fax:  [phone]  Physical Therapy Treatment  Patient Details  Name: Carol Cox MRN: [id number] Date of Birth: [birth year] Referring Provider (PT): Netta Cedars MD.   Encounter Date: 11/20/2019  PT End of Session - 11/20/19 1551    Visit Number  4    Number of Visits  12    Date for PT Re-Evaluation  12/06/19    Authorization Type  FOTO.    PT Start Time  0100    PT Stop Time  0154    PT Time Calculation (min)  54 min    Activity Tolerance  Patient tolerated treatment well    Behavior During Therapy  WFL for tasks assessed/performed       Past Medical History:  Diagnosis Date  . Allergy    seasonal  . Arthritis    "knee, left hip, ankles"   . Borderline glaucoma   . CAD (coronary artery disease)    Mild nonobstructive CAD by cath 02/06/13 (25% mid LAD), normal EF  . Family history of adverse reaction to anesthesia    mother has problems with nausea and vomiting   . Glaucoma   . History of colitis    02/ 2016  acute infectious colitis-- resolved  . Hyperlipidemia   . Left ovarian cyst   . Osteoporosis    ospenia of hips  . PONV (postoperative nausea and vomiting)    SEVERE    Past Surgical History:  Procedure Laterality Date  . CARPAL TUNNEL RELEASE Bilateral 1990's  . KNEE ARTHROSCOPY W/ MENISCECTOMY Left 07/2014  . LEFT HEART CATHETERIZATION WITH CORONARY ANGIOGRAM N/A 02/06/2013   Procedure: LEFT HEART CATHETERIZATION WITH CORONARY ANGIOGRAM;  Surgeon: [name] Hampshire, MD;  Location: [facility];  Service: Cardiovascular;  Laterality: N/A;  non-obstruction 25% mLAD,  normal LVF , ef 65-70%  . left knee meniscus surgery     . NASAL SINUS SURGERY    . ovarian cyst     left ovary  . ROTATOR CUFF REPAIR Right 10/2016  . TOTAL KNEE ARTHROPLASTY Left 03/26/2015   Procedure: LEFT TOTAL KNEE ARTHROPLASTY;  Surgeon: Paralee Cancel, MD;  Location:  [facility];  Service: Orthopedics;  Laterality: Left;    There were no vitals filed for this visit.  Subjective Assessment - 11/20/19 1543    Subjective  COVID-19 screen performed prior to patient entering clinic.  Actually having a good day.    Pertinent History  Right RTC repair, Left TKA, CTS.    How long can you stand comfortably?  10 minutes.    Diagnostic tests  Short community distances.    Patient Stated Goals  get out of pain and avoid surgery.    Currently in Pain?  Yes    Pain Score  3     Pain Location  Back    Pain Orientation  Right;Left    Pain Descriptors / Indicators  Shooting    Pain Type  Acute pain    Pain Onset  More than a month ago                       [facility] Adult PT Treatment/Exercise - 11/20/19 0001      Exercises   Exercises  Knee/Hip      Knee/Hip Exercises: Aerobic   Nustep  Level 3 x 10 minutes with steps/minute > 60 steps/minute.  Knee/Hip Exercises: Supine   Other Supine Knee/Hip Exercises  S and DKTC stretch and hip bridges x 10.      Traction   Type of Traction  Lumbar    Min (lbs)  5    Max (lbs)  70    Hold Time  99    Rest Time  5    Time  15      Manual Therapy   Manual Therapy  Soft tissue mobilization    Soft tissue mobilization  STW/M x 11 minutes to right SIJ region.               PT Short Term Goals - 11/08/19 1621      PT SHORT TERM GOAL #1   Title  STG's=LTG's        PT Long Term Goals - 11/08/19 1621      PT LONG TERM GOAL #1   Title  Ind with HEP.    Time  4    Period  Weeks    Status  New      PT LONG TERM GOAL #2   Title  Stand 20 minutes with pain not > 3/10.    Time  4    Period  Weeks    Status  New      PT LONG TERM GOAL #3   Title  Perform ADL's with pain not > 3/10.    Time  4    Period  Weeks    Status  New      PT LONG TERM GOAL #4   Title  Eliminate radiation of pain into right hip region.    Time  4    Period  Weeks    Status  New            Plan -  11/20/19 1546    Clinical Impression Statement  Patient did very well with treatment today.  Added hip bridges which she performed with excellent technique.  She also tolerated a 10# increase in traction without complaint.    Personal Factors and Comorbidities  Comorbidity 1;Comorbidity 2    Comorbidities  Right RTC repair, Left TKA, CTS.    Examination-Activity Limitations  Stand;Locomotion Level;Other    Examination-Participation Restrictions  Cleaning;Other    Stability/Clinical Decision Making  Evolving/Moderate complexity    PT Frequency  3x / week    PT Duration  4 weeks    PT Treatment/Interventions  ADLs/Self Care Home Management;Cryotherapy;Electrical Stimulation;Traction;Ultrasound;Moist Heat;Therapeutic activities;Therapeutic exercise;Manual techniques;Patient/family education;Dry needling;Spinal Manipulations    PT Next Visit Plan  Combo e'stim/U/S, STW/M to affected low back/right SIJ.  S and DKTC, hip bridges, Nustep/Ellipitical.  Can try intermittment traction at 30% body weight.    Consulted and Agree with Plan of Care  Patient       Patient will benefit from skilled therapeutic intervention in order to improve the following deficits and impairments:  Pain, Decreased activity tolerance, Postural dysfunction, Decreased range of motion  Visit Diagnosis: Acute bilateral low back pain without sciatica     Problem List Patient Active Problem List   Diagnosis Date Noted  . Hypertension 11/20/2019  . Family history of early CAD 07/15/2017  . Elevated blood pressure reading in office without diagnosis of hypertension 07/15/2017  . Obese 03/27/2015  . S/P left TKA 03/26/2015  . S/P knee replacement 03/26/2015  . Colitis, acute 12/01/2014  . Weight loss, unintentional 12/01/2014  . Adnexal cyst 12/01/2014  . Elevated fasting glucose 12/01/2014  . CAD (  coronary artery disease) 12/01/2014  . Osteoarthritis 12/01/2014  . Vasovagal attack 02/28/2013  . Hyperlipidemia  02/28/2013    Francies Inch, [place] MPT 11/20/2019, 3:54 PM  [facility] [address] [place], [place], [address] Phone: [phone]   Fax:  [phone]  Name: Carol Cox MRN: [id number] Date of Birth: 02-07-1951

## 2019-11-22 ENCOUNTER — Encounter: Payer: Self-pay | Admitting: Physical Therapy

## 2019-11-22 ENCOUNTER — Ambulatory Visit: Payer: Medicare PPO | Admitting: Physical Therapy

## 2019-11-22 ENCOUNTER — Other Ambulatory Visit: Payer: Self-pay

## 2019-11-22 DIAGNOSIS — M545 Low back pain, unspecified: Secondary | ICD-10-CM

## 2019-11-22 NOTE — Therapy (Signed)
[facility] [place], [place], [address] Phone: [phone]   Fax:  [phone]  Physical Therapy Treatment  Patient Details  Name: Carol Cox MRN: [id number] Date of Birth: July 14, 1951 Referring Provider (PT): Netta Cedars MD.   Encounter Date: 11/22/2019  PT End of Session - 11/22/19 1049    Visit Number  5    Number of Visits  12    Date for PT Re-Evaluation  12/06/19    Authorization Type  FOTO.    PT Start Time  1030    PT Stop Time  1115    PT Time Calculation (min)  45 min    Activity Tolerance  Patient tolerated treatment well    Behavior During Therapy  WFL for tasks assessed/performed       Past Medical History:  Diagnosis Date  . Allergy    seasonal  . Arthritis    "knee, left hip, ankles"   . Borderline glaucoma   . CAD (coronary artery disease)    Mild nonobstructive CAD by cath 02/06/13 (25% mid LAD), normal EF  . Family history of adverse reaction to anesthesia    mother has problems with nausea and vomiting   . Glaucoma   . History of colitis    02/ 2016  acute infectious colitis-- resolved  . Hyperlipidemia   . Left ovarian cyst   . Osteoporosis    ospenia of hips  . PONV (postoperative nausea and vomiting)    SEVERE    Past Surgical History:  Procedure Laterality Date  . CARPAL TUNNEL RELEASE Bilateral 1990's  . KNEE ARTHROSCOPY W/ MENISCECTOMY Left 07/2014  . LEFT HEART CATHETERIZATION WITH CORONARY ANGIOGRAM N/A 02/06/2013   Procedure: LEFT HEART CATHETERIZATION WITH CORONARY ANGIOGRAM;  Surgeon: [name] Hampshire, MD;  Location: [facility];  Service: Cardiovascular;  Laterality: N/A;  non-obstruction 25% mLAD,  normal LVF , ef 65-70%  . left knee meniscus surgery     . NASAL SINUS SURGERY    . ovarian cyst     left ovary  . ROTATOR CUFF REPAIR Right 10/2016  . TOTAL KNEE ARTHROPLASTY Left 03/26/2015   Procedure: LEFT TOTAL KNEE ARTHROPLASTY;  Surgeon: Paralee Cancel, MD;  Location:  [facility];  Service: Orthopedics;  Laterality: Left;    There were no vitals filed for this visit.  Subjective Assessment - 11/22/19 1036    Subjective  COVID-19 screen performed prior to patient entering clinic.  Patient reported doing well after last treatment    Pertinent History  Right RTC repair, Left TKA, CTS.    How long can you stand comfortably?  10 minutes.    Diagnostic tests  Short community distances.    Patient Stated Goals  get out of pain and avoid surgery.    Currently in Pain?  Yes    Pain Score  3     Pain Location  Back    Pain Orientation  Right;Left    Pain Descriptors / Indicators  Shooting    Pain Type  Acute pain    Pain Onset  More than a month ago    Pain Frequency  Constant    Aggravating Factors   prolong activity    Pain Relieving Factors  at rest                       [facility] Adult PT Treatment/Exercise - 11/22/19 0001      Exercises   Exercises  Lumbar  Lumbar Exercises: Stretches   Single Knee to Chest Stretch  Left;Right;3 reps;20 seconds    Double Knee to Chest Stretch  3 reps;20 seconds      Lumbar Exercises: Supine   Ab Set  10 reps;3 seconds    Glut Set  10 reps;3 seconds    Bent Knee Raise  3 seconds   2x10   Bridge  20 reps    Straight Leg Raise  3 seconds   2x10     Knee/Hip Exercises: Aerobic   Nustep  Level 3 x 10 minutes with steps/minute > 60 steps/minute.      Traction   Type of Traction  Lumbar    Min (lbs)  5    Max (lbs)  70    Hold Time  99    Rest Time  5    Time  15               PT Short Term Goals - 11/08/19 1621      PT SHORT TERM GOAL #1   Title  STG's=LTG's        PT Long Term Goals - 11/22/19 1050      PT LONG TERM GOAL #1   Title  Ind with HEP.    Time  4    Period  Weeks    Status  On-going      PT LONG TERM GOAL #2   Title  Stand 20 minutes with pain not > 3/10.    Time  4    Period  Weeks    Status  On-going   only able to stand no more than 5 min without  increased pain 11/22/19     PT LONG TERM GOAL #3   Title  Perform ADL's with pain not > 3/10.    Time  4    Period  Weeks    Status  On-going      PT LONG TERM GOAL #4   Title  Eliminate radiation of pain into right hip region.    Time  4    Period  Weeks    Status  On-going   ongoing with no relief at this time 11/22/19           Plan - 11/22/19 1052    Clinical Impression Statement  Patient tolerated treatment well today. Today focused on core progression exercises to improve strength and mobility. Patient doing well with mechanical traction and exercises thus far. Patient has consistant pain 3/10 yet increase with ADL's or prolong standing or activity. Patient current goals ongoing.    Personal Factors and Comorbidities  Comorbidity 1;Comorbidity 2    Comorbidities  Right RTC repair, Left TKA, CTS.    Examination-Activity Limitations  Stand;Locomotion Level;Other    Examination-Participation Restrictions  Cleaning;Other    Stability/Clinical Decision Making  Evolving/Moderate complexity    PT Frequency  3x / week    PT Duration  4 weeks    PT Treatment/Interventions  ADLs/Self Care Home Management;Cryotherapy;Electrical Stimulation;Traction;Ultrasound;Moist Heat;Therapeutic activities;Therapeutic exercise;Manual techniques;Patient/family education;Dry needling;Spinal Manipulations    PT Next Visit Plan  cont with POC for Combo e'stim/U/S, STW/M to affected low back/right SIJ.  S and DKTC, hip bridges, Nustep/Ellipitical may increase traction per PT next treatment    Consulted and Agree with Plan of Care  Patient       Patient will benefit from skilled therapeutic intervention in order to improve the following deficits and impairments:  Pain, Decreased activity tolerance, Postural dysfunction, Decreased  range of motion  Visit Diagnosis: Acute bilateral low back pain without sciatica     Problem List Patient Active Problem List   Diagnosis Date Noted  . Hypertension  11/20/2019  . Family history of early CAD 07/15/2017  . Elevated blood pressure reading in office without diagnosis of hypertension 07/15/2017  . Obese 03/27/2015  . S/P left TKA 03/26/2015  . S/P knee replacement 03/26/2015  . Colitis, acute 12/01/2014  . Weight loss, unintentional 12/01/2014  . Adnexal cyst 12/01/2014  . Elevated fasting glucose 12/01/2014  . CAD (coronary artery disease) 12/01/2014  . Osteoarthritis 12/01/2014  . Vasovagal attack 02/28/2013  . Hyperlipidemia 02/28/2013    Phillips Climes, PTA 11/22/2019, 11:23 AM  [facility] [place], [place], [address] Phone: [phone]   Fax:  [phone]  Name: Carol Cox MRN: [id number] Date of Birth: Jul 05, 1951

## 2019-11-24 ENCOUNTER — Ambulatory Visit
Admission: RE | Admit: 2019-11-24 | Discharge: 2019-11-24 | Disposition: A | Discharge status: Home / Self Care | Payer: Self-pay | Source: Ambulatory Visit | Attending: Orthopedic Surgery | Admitting: Orthopedic Surgery

## 2019-11-24 ENCOUNTER — Ambulatory Visit
Admission: RE | Admit: 2019-11-24 | Discharge: 2019-11-24 | Disposition: A | Discharge status: Home / Self Care | Payer: Medicare PPO | Source: Ambulatory Visit | Attending: Orthopedic Surgery | Admitting: Orthopedic Surgery

## 2019-11-24 ENCOUNTER — Other Ambulatory Visit: Payer: Self-pay

## 2019-11-24 ENCOUNTER — Other Ambulatory Visit: Payer: Self-pay | Admitting: Orthopedic Surgery

## 2019-11-24 DIAGNOSIS — G8929 Other chronic pain: Secondary | ICD-10-CM

## 2019-11-24 DIAGNOSIS — M545 Low back pain, unspecified: Secondary | ICD-10-CM

## 2019-11-24 MED ORDER — CEFAZOLIN SODIUM-DEXTROSE 2-4 GM/100ML-% IV SOLN
2.0000 g | Freq: Once | INTRAVENOUS | Status: AC
  Administered 2019-11-24: 09:00:00 2 g via INTRAVENOUS

## 2019-11-24 MED ORDER — IOPAMIDOL (ISOVUE-M 200) INJECTION 41%
1.0000 mL | Freq: Once | INTRAMUSCULAR | Status: AC
  Administered 2019-11-24: 11:00:00 1 mL

## 2019-11-24 NOTE — Discharge Instructions (Signed)

## 2019-11-27 ENCOUNTER — Ambulatory Visit: Payer: Medicare PPO | Attending: Orthopedic Surgery | Admitting: Physical Therapy

## 2019-11-27 ENCOUNTER — Other Ambulatory Visit: Payer: Self-pay

## 2019-11-27 ENCOUNTER — Encounter: Payer: Self-pay | Admitting: Physical Therapy

## 2019-11-27 DIAGNOSIS — M545 Low back pain, unspecified: Secondary | ICD-10-CM

## 2019-11-27 NOTE — Therapy (Signed)
[facility] [place], [place], [address] Phone: [phone]   Fax:  [phone]  Physical Therapy Treatment  Patient Details  Name: Carol Cox MRN: [id number] Date of Birth: Mar 29, 1951 Referring Provider (PT): Netta Cedars MD.   Encounter Date: 11/27/2019  PT End of Session - 11/27/19 1050    Visit Number  6    Number of Visits  12    Date for PT Re-Evaluation  12/06/19    Authorization Type  FOTO.    PT Start Time  1032    PT Stop Time  1117    PT Time Calculation (min)  45 min    Activity Tolerance  Patient tolerated treatment well    Behavior During Therapy  WFL for tasks assessed/performed       Past Medical History:  Diagnosis Date  . Allergy    seasonal  . Arthritis    "knee, left hip, ankles"   . Borderline glaucoma   . CAD (coronary artery disease)    Mild nonobstructive CAD by cath 02/06/13 (25% mid LAD), normal EF  . Family history of adverse reaction to anesthesia    mother has problems with nausea and vomiting   . Glaucoma   . History of colitis    02/ 2016  acute infectious colitis-- resolved  . Hyperlipidemia   . Left ovarian cyst   . Osteoporosis    ospenia of hips  . PONV (postoperative nausea and vomiting)    SEVERE    Past Surgical History:  Procedure Laterality Date  . CARPAL TUNNEL RELEASE Bilateral 1990's  . KNEE ARTHROSCOPY W/ MENISCECTOMY Left 07/2014  . LEFT HEART CATHETERIZATION WITH CORONARY ANGIOGRAM N/A 02/06/2013   Procedure: LEFT HEART CATHETERIZATION WITH CORONARY ANGIOGRAM;  Surgeon: [name] Hampshire, MD;  Location: [facility];  Service: Cardiovascular;  Laterality: N/A;  non-obstruction 25% mLAD,  normal LVF , ef 65-70%  . left knee meniscus surgery     . NASAL SINUS SURGERY    . ovarian cyst     left ovary  . ROTATOR CUFF REPAIR Right 10/2016  . TOTAL KNEE ARTHROPLASTY Left 03/26/2015   Procedure: LEFT TOTAL KNEE ARTHROPLASTY;  Surgeon: Paralee Cancel, MD;  Location:  [facility];  Service: Orthopedics;  Laterality: Left;    There were no vitals filed for this visit.  Subjective Assessment - 11/27/19 1037    Subjective  COVID-19 screen performed prior to patient entering clinic.  Patient reported doing good after last treatment and doing well upon arrival, she had a numbing shot on friday that only lasted a few hours    Pertinent History  Right RTC repair, Left TKA, CTS.    How long can you stand comfortably?  10 minutes.    Diagnostic tests  Short community distances.    Patient Stated Goals  get out of pain and avoid surgery.    Currently in Pain?  Yes    Pain Score  4     Pain Location  Back    Pain Orientation  Right;Left    Pain Descriptors / Indicators  Discomfort    Pain Type  Acute pain    Pain Onset  More than a month ago    Pain Frequency  Constant    Aggravating Factors   prolong activity or prolong standing    Pain Relieving Factors  at rest  [facility] Adult PT Treatment/Exercise - 11/27/19 0001      Lumbar Exercises: Stretches   Single Knee to Chest Stretch  Left;Right;3 reps;20 seconds    Double Knee to Chest Stretch  3 reps;20 seconds      Lumbar Exercises: Supine   Ab Set  10 reps;3 seconds    Glut Set  10 reps;3 seconds    Bent Knee Raise  3 seconds   2x10   Bridge with clamshell  20 reps    Bridge with [employer] Limitations  red t-band    Straight Leg Raise  3 seconds   2x10     Knee/Hip Exercises: Aerobic   Nustep  Level 3 x 10 minutes with steps/minute > 60 steps/minute.      Traction   Type of Traction  Lumbar    Min (lbs)  5    Max (lbs)  80    Hold Time  99    Rest Time  5    Time  15             PT Education - 11/27/19 1112    Education Details  Posture awareness techniques HEP printout    Person(s) Educated  Patient;Spouse    Methods  Explanation;Demonstration;Handout    Comprehension  Verbalized understanding;Returned demonstration       PT Short Term Goals -  11/08/19 1621      PT SHORT TERM GOAL #1   Title  STG's=LTG's        PT Long Term Goals - 11/27/19 1056      PT LONG TERM GOAL #1   Title  Ind with HEP.    Time  4    Period  Weeks    Status  On-going      PT LONG TERM GOAL #2   Title  Stand 20 minutes with pain not > 3/10.    Time  4    Period  Weeks    Status  On-going   98min 11/27/19     PT LONG TERM GOAL #3   Title  Perform ADL's with pain not > 3/10.    Time  4    Period  Weeks    Status  On-going      PT LONG TERM GOAL #4   Title  Eliminate radiation of pain into right hip region.    Time  4    Period  Weeks    Status  On-going            Plan - 11/27/19 1057    Clinical Impression Statement  Patient tolerated treatment well today. Patient able to progress with core activation today and mechanical traction with good response. Patient went to get a numbing shot last week that only lasted a few hours. Patient will have a F/U in a few weeks. Patient current goals progressing due to ongoing pain.    Personal Factors and Comorbidities  Comorbidity 1;Comorbidity 2    Comorbidities  Right RTC repair, Left TKA, CTS.    Examination-Activity Limitations  Stand;Locomotion Level;Other    Examination-Participation Restrictions  Cleaning;Other    Stability/Clinical Decision Making  Evolving/Moderate complexity    PT Frequency  3x / week    PT Duration  4 weeks    PT Treatment/Interventions  ADLs/Self Care Home Management;Cryotherapy;Electrical Stimulation;Traction;Ultrasound;Moist Heat;Therapeutic activities;Therapeutic exercise;Manual techniques;Patient/family education;Dry needling;Spinal Manipulations    PT Next Visit Plan  cont with POC for Combo e'stim/U/S, STW/M to affected low back/right SIJ.  S and  DKTC, hip bridges, Nustep/Ellipitical    Consulted and Agree with Plan of Care  Patient       Patient will benefit from skilled therapeutic intervention in order to improve the following deficits and impairments:   Pain, Decreased activity tolerance, Postural dysfunction, Decreased range of motion  Visit Diagnosis: Acute bilateral low back pain without sciatica     Problem List Patient Active Problem List   Diagnosis Date Noted  . Hypertension 11/20/2019  . Family history of early CAD 07/15/2017  . Elevated blood pressure reading in office without diagnosis of hypertension 07/15/2017  . Obese 03/27/2015  . S/P left TKA 03/26/2015  . S/P knee replacement 03/26/2015  . Colitis, acute 12/01/2014  . Weight loss, unintentional 12/01/2014  . Adnexal cyst 12/01/2014  . Elevated fasting glucose 12/01/2014  . CAD (coronary artery disease) 12/01/2014  . Osteoarthritis 12/01/2014  . Vasovagal attack 02/28/2013  . Hyperlipidemia 02/28/2013    Phillips Climes, PTA 11/27/2019, 11:34 AM  [facility] [place], [place], [address] Phone: [phone]   Fax:  [phone]  Name: KATHE CAMPA MRN: [id number] Date of Birth: 08/13/1951

## 2019-11-27 NOTE — Patient Instructions (Signed)

## 2019-11-29 ENCOUNTER — Other Ambulatory Visit: Payer: Self-pay

## 2019-11-29 ENCOUNTER — Encounter: Payer: Self-pay | Admitting: Physical Therapy

## 2019-11-29 ENCOUNTER — Ambulatory Visit: Payer: Medicare PPO | Admitting: Physical Therapy

## 2019-11-29 DIAGNOSIS — M545 Low back pain, unspecified: Secondary | ICD-10-CM

## 2019-11-29 NOTE — Therapy (Signed)
[facility] [place], [place], [address] Phone: [phone]   Fax:  [phone]  Physical Therapy Treatment  Patient Details  Name: Carol Cox MRN: [id number] Date of Birth: 08/19/1951 Referring Provider (PT): Netta Cedars MD.   Encounter Date: 11/29/2019  PT End of Session - 11/29/19 1109    Visit Number  7    Number of Visits  12    Date for PT Re-Evaluation  12/06/19    Authorization Type  FOTO.    PT Start Time  1033    PT Stop Time  1118    PT Time Calculation (min)  45 min    Activity Tolerance  Patient tolerated treatment well    Behavior During Therapy  WFL for tasks assessed/performed       Past Medical History:  Diagnosis Date  . Allergy    seasonal  . Arthritis    "knee, left hip, ankles"   . Borderline glaucoma   . CAD (coronary artery disease)    Mild nonobstructive CAD by cath 02/06/13 (25% mid LAD), normal EF  . Family history of adverse reaction to anesthesia    mother has problems with nausea and vomiting   . Glaucoma   . History of colitis    02/ 2016  acute infectious colitis-- resolved  . Hyperlipidemia   . Left ovarian cyst   . Osteoporosis    ospenia of hips  . PONV (postoperative nausea and vomiting)    SEVERE    Past Surgical History:  Procedure Laterality Date  . CARPAL TUNNEL RELEASE Bilateral 1990's  . KNEE ARTHROSCOPY W/ MENISCECTOMY Left 07/2014  . LEFT HEART CATHETERIZATION WITH CORONARY ANGIOGRAM N/A 02/06/2013   Procedure: LEFT HEART CATHETERIZATION WITH CORONARY ANGIOGRAM;  Surgeon: [name] Hampshire, MD;  Location: [facility];  Service: Cardiovascular;  Laterality: N/A;  non-obstruction 25% mLAD,  normal LVF , ef 65-70%  . left knee meniscus surgery     . NASAL SINUS SURGERY    . ovarian cyst     left ovary  . ROTATOR CUFF REPAIR Right 10/2016  . TOTAL KNEE ARTHROPLASTY Left 03/26/2015   Procedure: LEFT TOTAL KNEE ARTHROPLASTY;  Surgeon: Paralee Cancel, MD;  Location:  [facility];  Service: Orthopedics;  Laterality: Left;    There were no vitals filed for this visit.  Subjective Assessment - 11/29/19 1037    Subjective  COVID-19 screen performed prior to patient entering clinic.  Patient reported doing good after last session    Pertinent History  Right RTC repair, Left TKA, CTS.    How long can you stand comfortably?  10 minutes.    Diagnostic tests  Short community distances.    Patient Stated Goals  get out of pain and avoid surgery.    Currently in Pain?  Yes    Pain Score  4     Pain Location  Back    Pain Orientation  Left;Right    Pain Descriptors / Indicators  Discomfort    Pain Type  Acute pain    Pain Onset  More than a month ago    Pain Frequency  Constant    Aggravating Factors   prolong ADL's or activity    Pain Relieving Factors  rest                       [facility] Adult PT Treatment/Exercise - 11/29/19 0001      Lumbar Exercises: Stretches  Single Knee to Chest Stretch  Left;Right;3 reps;20 seconds    Double Knee to Chest Stretch  3 reps;20 seconds      Lumbar Exercises: Standing   Other Standing Lumbar Exercises  blue XTS for ext pulls x20 with core focus      Lumbar Exercises: Supine   Bent Knee Raise  3 seconds   2x15   Bridge with clamshell  20 reps    Bridge with [employer] Limitations  red t-band    Straight Leg Raise  3 seconds   2x15     Knee/Hip Exercises: Aerobic   Nustep  Level 3 x 12 minutes with steps/minute > 60 steps/minute.      Traction   Type of Traction  Lumbar    Min (lbs)  5    Max (lbs)  80    Hold Time  99    Rest Time  5    Time  15               PT Short Term Goals - 11/08/19 1621      PT SHORT TERM GOAL #1   Title  STG's=LTG's        PT Long Term Goals - 11/27/19 1056      PT LONG TERM GOAL #1   Title  Ind with HEP.    Time  4    Period  Weeks    Status  On-going      PT LONG TERM GOAL #2   Title  Stand 20 minutes with pain not > 3/10.    Time  4     Period  Weeks    Status  On-going   64min 11/27/19     PT LONG TERM GOAL #3   Title  Perform ADL's with pain not > 3/10.    Time  4    Period  Weeks    Status  On-going      PT LONG TERM GOAL #4   Title  Eliminate radiation of pain into right hip region.    Time  4    Period  Weeks    Status  On-going            Plan - 11/29/19 1110    Clinical Impression Statement  Patient tolerated treatment well today. Patient able to progress with core activation exercises today with no increased discomfort. Patient continues to respond well to mechanical traction. Patient has ongoing soreness in low back esp with any prolong ADL's or activity. Current goals ongoing due to limitations.    Personal Factors and Comorbidities  Comorbidity 1;Comorbidity 2    Comorbidities  Right RTC repair, Left TKA, CTS.    Examination-Activity Limitations  Stand;Locomotion Level;Other    Examination-Participation Restrictions  Cleaning;Other    Stability/Clinical Decision Making  Evolving/Moderate complexity    PT Frequency  3x / week    PT Duration  4 weeks    PT Treatment/Interventions  ADLs/Self Care Home Management;Cryotherapy;Electrical Stimulation;Traction;Ultrasound;Moist Heat;Therapeutic activities;Therapeutic exercise;Manual techniques;Patient/family education;Dry needling;Spinal Manipulations    PT Next Visit Plan  cont with POC for Combo e'stim/U/S, STW/M to affected low back/right SIJ.  S and DKTC, hip bridges, Nustep/Ellipitical MD note needed next visit for MD F/U    Consulted and Agree with Plan of Care  Patient       Patient will benefit from skilled therapeutic intervention in order to improve the following deficits and impairments:  Pain, Decreased activity tolerance, Postural dysfunction, Decreased range of motion  Visit Diagnosis: Acute bilateral low back pain without sciatica     Problem List Patient Active Problem List   Diagnosis Date Noted  . Hypertension 11/20/2019  . Family  history of early CAD 07/15/2017  . Elevated blood pressure reading in office without diagnosis of hypertension 07/15/2017  . Obese 03/27/2015  . S/P left TKA 03/26/2015  . S/P knee replacement 03/26/2015  . Colitis, acute 12/01/2014  . Weight loss, unintentional 12/01/2014  . Adnexal cyst 12/01/2014  . Elevated fasting glucose 12/01/2014  . CAD (coronary artery disease) 12/01/2014  . Osteoarthritis 12/01/2014  . Vasovagal attack 02/28/2013  . Hyperlipidemia 02/28/2013    Phillips Climes, PTA 11/29/2019, 11:36 AM  [facility] [place], [place], [address] Phone: [phone]   Fax:  [phone]  Name: Carol Cox MRN: [id number] Date of Birth: September 30, 1951

## 2019-12-04 ENCOUNTER — Encounter: Payer: Self-pay | Admitting: Physical Therapy

## 2019-12-04 ENCOUNTER — Other Ambulatory Visit: Payer: Self-pay

## 2019-12-04 ENCOUNTER — Ambulatory Visit: Payer: Medicare PPO | Admitting: Physical Therapy

## 2019-12-04 DIAGNOSIS — M545 Low back pain, unspecified: Secondary | ICD-10-CM

## 2019-12-04 NOTE — Therapy (Signed)
[facility] [address] [place], [place], [address] Phone: [phone]   Fax:  [phone]  Physical Therapy Treatment  Patient Details  Name: Carol Cox MRN: [id number] Date of Birth: 11/28/1950 Referring Provider (PT): Steve Norris MD.   Encounter Date: 12/04/2019  PT End of Session - 12/04/19 1109    Visit Number  8    Number of Visits  12    Date for PT Re-Evaluation  12/06/19    Authorization Type  FOTO.    PT Start Time  1033    PT Stop Time  1115    PT Time Calculation (min)  42 min    Activity Tolerance  Patient limited by pain    Behavior During Therapy  WFL for tasks assessed/performed       Past Medical History:  Diagnosis Date  . Allergy    seasonal  . Arthritis    "knee, left hip, ankles"   . Borderline glaucoma   . CAD (coronary artery disease)    Mild nonobstructive CAD by cath 02/06/13 (25% mid LAD), normal EF  . Family history of adverse reaction to anesthesia    mother has problems with nausea and vomiting   . Glaucoma   . History of colitis    02/ 2016  acute infectious colitis-- resolved  . Hyperlipidemia   . Left ovarian cyst   . Osteoporosis    ospenia of hips  . PONV (postoperative nausea and vomiting)    SEVERE    Past Surgical History:  Procedure Laterality Date  . CARPAL TUNNEL RELEASE Bilateral 1990's  . KNEE ARTHROSCOPY W/ MENISCECTOMY Left 07/2014  . LEFT HEART CATHETERIZATION WITH CORONARY ANGIOGRAM N/A 02/06/2013   Procedure: LEFT HEART CATHETERIZATION WITH CORONARY ANGIOGRAM;  Surgeon: Muhammad A Arida, MD;  Location: [facility];  Service: Cardiovascular;  Laterality: N/A;  non-obstruction 25% mLAD,  normal LVF , ef 65-70%  . left knee meniscus surgery     . NASAL SINUS SURGERY    . ovarian cyst     left ovary  . ROTATOR CUFF REPAIR Right 10/2016  . TOTAL KNEE ARTHROPLASTY Left 03/26/2015   Procedure: LEFT TOTAL KNEE ARTHROPLASTY;  Surgeon: Matthew Olin, MD;  Location: [facility];   Service: Orthopedics;  Laterality: Left;    There were no vitals filed for this visit.  Subjective Assessment - 12/04/19 1041    Subjective  COVID-19 screen performed prior to patient entering clinic.  Patient reported increased pain and symptoms down LE    Pertinent History  Right RTC repair, Left TKA, CTS.    How long can you stand comfortably?  10 minutes.    Diagnostic tests  Short community distances.    Patient Stated Goals  get out of pain and avoid surgery.    Currently in Pain?  Yes    Pain Score  6     Pain Location  Back    Pain Orientation  Left    Pain Descriptors / Indicators  Discomfort;Tingling    Pain Radiating Towards  down little toe on left    Pain Onset  More than a month ago    Pain Frequency  Intermittent    Aggravating Factors   prolong activity    Pain Relieving Factors  at rest                       [facility] Adult PT Treatment/Exercise - 12/04/19 0001      Modalities     Modalities  Electrical Stimulation;Moist Heat;Ultrasound      Moist Heat Therapy   Number Minutes Moist Heat  15 Minutes    Moist Heat Location  Lumbar Spine      Electrical Stimulation   Electrical Stimulation Location  Lumbar spine    Electrical Stimulation Action  IFC    Electrical Stimulation Parameters  80-150hz x15min    Electrical Stimulation Goals  Pain      Ultrasound   Ultrasound Location  right low back    Ultrasound Parameters  1.5w/cm2/100%/1mhz x10min    Ultrasound Goals  Pain      Manual Therapy   Manual Therapy  Soft tissue mobilization    Soft tissue mobilization  manual STW to bil mid to low back paraspinals to decrease pain and tone               PT Short Term Goals - 11/08/19 1621      PT SHORT TERM GOAL #1   Title  STG's=LTG's        PT Long Term Goals - 12/04/19 1110      PT LONG TERM GOAL #1   Title  Ind with HEP.    Time  4    Period  Weeks    Status  Achieved   Patient doing HEP and activity daily as able with good  understanding of exercises 12/04/19     PT LONG TERM GOAL #2   Title  Stand 20 minutes with pain not > 3/10.    Time  4    Period  Weeks    Status  On-going   Patients pain at 6/10 today  12/04/19     PT LONG TERM GOAL #3   Title  Perform ADL's with pain not > 3/10.    Time  4    Period  Weeks    Status  On-going   patients pain level 6/10 and increases with prolong activity 12/04/19     PT LONG TERM GOAL #4   Title  Eliminate radiation of pain into right hip region.    Time  4    Period  Weeks    Status  On-going   symptoms continue at this time 12/04/19           Plan - 12/04/19 1113    Clinical Impression Statement  Patient tolerated treatment fair due to increased pain and symptoms. Patient has palpable pain in right mid to low back and radiating symptoms down left LE down hip to little toe. Patient understands all exercises and progression yet no relief at this time. Today did not perform traction or exercises due to increased discomfort. Focused on modalities and STW to decrease pain and tone. Patient is going to MD tomorrow for follow up Patient met HEP goal with others ongoing due to pain deficts.    Personal Factors and Comorbidities  Comorbidity 1;Comorbidity 2    Comorbidities  Right RTC repair, Left TKA, CTS.    Examination-Activity Limitations  Stand;Locomotion Level;Other    Examination-Participation Restrictions  Cleaning;Other    Stability/Clinical Decision Making  Evolving/Moderate complexity    PT Frequency  3x / week    PT Duration  4 weeks    PT Treatment/Interventions  ADLs/Self Care Home Management;Cryotherapy;Electrical Stimulation;Traction;Ultrasound;Moist Heat;Therapeutic activities;Therapeutic exercise;Manual techniques;Patient/family education;Dry needling;Spinal Manipulations    PT Next Visit Plan  on hold per MD visit    Consulted and Agree with Plan of Care  Patient         Patient will benefit from skilled therapeutic intervention in order to improve  the following deficits and impairments:  Pain, Decreased activity tolerance, Postural dysfunction, Decreased range of motion  Visit Diagnosis: Acute bilateral low back pain without sciatica     Problem List Patient Active Problem List   Diagnosis Date Noted  . Hypertension 11/20/2019  . Family history of early CAD 07/15/2017  . Elevated blood pressure reading in office without diagnosis of hypertension 07/15/2017  . Obese 03/27/2015  . S/P left TKA 03/26/2015  . S/P knee replacement 03/26/2015  . Colitis, acute 12/01/2014  . Weight loss, unintentional 12/01/2014  . Adnexal cyst 12/01/2014  . Elevated fasting glucose 12/01/2014  . CAD (coronary artery disease) 12/01/2014  . Osteoarthritis 12/01/2014  . Vasovagal attack 02/28/2013  . Hyperlipidemia 02/28/2013     , PTA 12/04/19 11:28 AM  [facility] [address] [place], [place], [address] Phone: [phone]   Fax:  [phone]  Name: Marnee A Aldrete MRN: [id number] Date of Birth: 06/24/1951   

## 2019-12-06 ENCOUNTER — Encounter: Payer: Self-pay | Admitting: Physical Therapy

## 2019-12-06 ENCOUNTER — Ambulatory Visit: Payer: Medicare PPO | Admitting: Physical Therapy

## 2019-12-06 ENCOUNTER — Other Ambulatory Visit: Payer: Self-pay

## 2019-12-06 DIAGNOSIS — M545 Low back pain, unspecified: Secondary | ICD-10-CM

## 2019-12-06 NOTE — Therapy (Signed)
[facility] [place], [place], [address] Phone: [phone]   Fax:  [phone]  Physical Therapy Treatment  Patient Details  Name: Carol Cox MRN: [id number] Date of Birth: May 25, 1951 Referring Provider (PT): Netta Cedars MD.   Encounter Date: 12/06/2019  PT End of Session - 12/06/19 1119    Visit Number  9    Number of Visits  12    Date for PT Re-Evaluation  12/06/19    Authorization Type  FOTO.    PT Start Time  1038    PT Stop Time  1120    PT Time Calculation (min)  42 min    Activity Tolerance  Patient tolerated treatment well    Behavior During Therapy  WFL for tasks assessed/performed       Past Medical History:  Diagnosis Date  . Allergy    seasonal  . Arthritis    "knee, left hip, ankles"   . Borderline glaucoma   . CAD (coronary artery disease)    Mild nonobstructive CAD by cath 02/06/13 (25% mid LAD), normal EF  . Family history of adverse reaction to anesthesia    mother has problems with nausea and vomiting   . Glaucoma   . History of colitis    02/ 2016  acute infectious colitis-- resolved  . Hyperlipidemia   . Left ovarian cyst   . Osteoporosis    ospenia of hips  . PONV (postoperative nausea and vomiting)    SEVERE    Past Surgical History:  Procedure Laterality Date  . CARPAL TUNNEL RELEASE Bilateral 1990's  . KNEE ARTHROSCOPY W/ MENISCECTOMY Left 07/2014  . LEFT HEART CATHETERIZATION WITH CORONARY ANGIOGRAM N/A 02/06/2013   Procedure: LEFT HEART CATHETERIZATION WITH CORONARY ANGIOGRAM;  Surgeon: [name] Hampshire, MD;  Location: [facility];  Service: Cardiovascular;  Laterality: N/A;  non-obstruction 25% mLAD,  normal LVF , ef 65-70%  . left knee meniscus surgery     . NASAL SINUS SURGERY    . ovarian cyst     left ovary  . ROTATOR CUFF REPAIR Right 10/2016  . TOTAL KNEE ARTHROPLASTY Left 03/26/2015   Procedure: LEFT TOTAL KNEE ARTHROPLASTY;  Surgeon: Paralee Cancel, MD;  Location:  [facility];  Service: Orthopedics;  Laterality: Left;    There were no vitals filed for this visit.  Subjective Assessment - 12/06/19 1117    Subjective  COVID-19 screen performed prior to patient entering clinic.  Patient reported increased pain and symptoms down LE. To consult with Ramos tomorrow regarding pain injection. Looking at fixing L5-S1 as it is the worst. Did sleep some better last night.    Pertinent History  Right RTC repair, Left TKA, CTS.    How long can you stand comfortably?  10 minutes.    Diagnostic tests  Short community distances.    Patient Stated Goals  get out of pain and avoid surgery.    Currently in Pain?  Yes    Pain Score  6     Pain Location  Back    Pain Orientation  Right;Left;Lower    Pain Descriptors / Indicators  Discomfort    Pain Type  Acute pain    Pain Radiating Towards  down lateral LLE to toes    Pain Onset  More than a month ago    Pain Frequency  Intermittent         [facility] PT Assessment - 12/06/19 0001      Assessment  Medical Diagnosis  Spinal stenosis, lumbar region.    Referring Provider (PT)  Netta Cedars MD.      Precautions   Precautions  None      Restrictions   Weight Bearing Restrictions  No                   [facility] Adult PT Treatment/Exercise - 12/06/19 0001      Modalities   Modalities  Electrical Stimulation;Moist Heat;Ultrasound      Moist Heat Therapy   Number Minutes Moist Heat  15 Minutes    Moist Heat Location  Lumbar Spine      Electrical Stimulation   Electrical Stimulation Location  B low back    Electrical Stimulation Action  IFC    Electrical Stimulation Parameters  80-150 hz x15 min    Electrical Stimulation Goals  Pain      Ultrasound   Ultrasound Location  B QL, lumbar paraspinals    Ultrasound Parameters  Combo 1.5 w/cm2, 100% x10 min    Ultrasound Goals  Pain      Manual Therapy   Manual Therapy  Soft tissue mobilization    Soft tissue mobilization  STW to B QL, lumbar paraspinals  to reduce pain               PT Short Term Goals - 11/08/19 1621      PT SHORT TERM GOAL #1   Title  STG's=LTG's        PT Long Term Goals - 12/04/19 1110      PT LONG TERM GOAL #1   Title  Ind with HEP.    Time  4    Period  Weeks    Status  Achieved   Patient doing HEP and activity daily as able with good understanding of exercises 12/04/19     PT LONG TERM GOAL #2   Title  Stand 20 minutes with pain not > 3/10.    Time  4    Period  Weeks    Status  On-going   Patients pain at 6/10 today  12/04/19     PT LONG TERM GOAL #3   Title  Perform ADL's with pain not > 3/10.    Time  4    Period  Weeks    Status  On-going   patients pain level 6/10 and increases with prolong activity 12/04/19     PT LONG TERM GOAL #4   Title  Eliminate radiation of pain into right hip region.    Time  4    Period  Weeks    Status  On-going   symptoms continue at this time 12/04/19           Plan - 12/06/19 1152    Clinical Impression Statement  Patient presented in clinic with continued LBP and radicular pain down LLE. Patient states that LBP is greater in R low back. Patient to consult with another MD tomorrow regarding pain injections and possible upcoming surgery. Patient did not report any increased pain during manual therapy. Moderate tone palpable in R QL upon assessement. Normal modalities response noted following removal of the modalities. Patient to receive TENS unit in the mail tomorrow as well and educated to bring it in for proper education.    Personal Factors and Comorbidities  Comorbidity 1;Comorbidity 2    Comorbidities  Right RTC repair, Left TKA, CTS.    Examination-Activity Limitations  Stand;Locomotion Level;Other    Examination-Participation Restrictions  Cleaning;Other    Stability/Clinical Decision Making  Evolving/Moderate complexity    PT Frequency  3x / week    PT Duration  4 weeks    PT Treatment/Interventions  ADLs/Self Care Home  Management;Cryotherapy;Electrical Stimulation;Traction;Ultrasound;Moist Heat;Therapeutic activities;Therapeutic exercise;Manual techniques;Patient/family education;Dry needling;Spinal Manipulations    PT Next Visit Plan  Continue per MD request but possible hold or D/C due to symptoms.    Consulted and Agree with Plan of Care  Patient       Patient will benefit from skilled therapeutic intervention in order to improve the following deficits and impairments:  Pain, Decreased activity tolerance, Postural dysfunction, Decreased range of motion  Visit Diagnosis: Acute bilateral low back pain without sciatica     Problem List Patient Active Problem List   Diagnosis Date Noted  . Hypertension 11/20/2019  . Family history of early CAD 07/15/2017  . Elevated blood pressure reading in office without diagnosis of hypertension 07/15/2017  . Obese 03/27/2015  . S/P left TKA 03/26/2015  . S/P knee replacement 03/26/2015  . Colitis, acute 12/01/2014  . Weight loss, unintentional 12/01/2014  . Adnexal cyst 12/01/2014  . Elevated fasting glucose 12/01/2014  . CAD (coronary artery disease) 12/01/2014  . Osteoarthritis 12/01/2014  . Vasovagal attack 02/28/2013  . Hyperlipidemia 02/28/2013    Standley Brooking, PTA 12/06/2019, 12:04 PM  [facility] [address] [place], [place], [address] Phone: [phone]   Fax:  [phone]  Name: Carol Cox MRN: [id number] Date of Birth: 01/18/1951

## 2019-12-11 ENCOUNTER — Encounter: Payer: Self-pay | Admitting: Physical Therapy

## 2019-12-11 ENCOUNTER — Ambulatory Visit: Payer: Medicare PPO | Admitting: Physical Therapy

## 2019-12-11 ENCOUNTER — Other Ambulatory Visit: Payer: Self-pay

## 2019-12-11 ENCOUNTER — Ambulatory Visit (INDEPENDENT_AMBULATORY_CARE_PROVIDER_SITE_OTHER): Payer: Medicare PPO | Admitting: Pharmacist

## 2019-12-11 VITALS — BP 115/76 | HR 78

## 2019-12-11 DIAGNOSIS — M545 Low back pain, unspecified: Secondary | ICD-10-CM

## 2019-12-11 DIAGNOSIS — I10 Essential (primary) hypertension: Secondary | ICD-10-CM | POA: Diagnosis not present

## 2019-12-11 LAB — BASIC METABOLIC PANEL
BUN/Creatinine Ratio: 20 (ref 12–28)
BUN: 18 mg/dL (ref 8–27)
CO2: 21 mmol/L (ref 20–29)
Calcium: 9.7 mg/dL (ref 8.7–10.3)
Chloride: 105 mmol/L (ref 96–106)
Creatinine, Ser: 0.92 mg/dL (ref 0.57–1.00)
GFR calc Af Amer: 75 mL/min/{1.73_m2} (ref >59)
GFR calc non Af Amer: 65 mL/min/{1.73_m2} (ref >59)
Glucose: 127 mg/dL — ABNORMAL HIGH (ref 65–99)
Potassium: 4.5 mmol/L (ref 3.5–5.2)
Sodium: 142 mmol/L (ref 134–144)

## 2019-12-11 NOTE — Patient Instructions (Signed)
It was great seeing you again today!   Continue lisinopril 20 mg daily and checking your blood pressure at home.   Your blood pressure goal is <130/80 and you are at goal with most of your blood pressures.   Increase exercise as tolerated.   We will call you with the results of your lab work from today.

## 2019-12-11 NOTE — Therapy (Signed)
[facility] [place], [place], [address] Phone: [phone]   Fax:  [phone]  Physical Therapy Treatment  Patient Details  Name: Carol Cox MRN: [id number] Date of Birth: 1951-05-13 Referring Provider (PT): Netta Cedars MD.   Encounter Date: 12/11/2019  PT End of Session - 12/11/19 1311    Visit Number  10    Number of Visits  12    Date for PT Re-Evaluation  12/27/19    Authorization Type  FOTO.    PT Start Time  1311    PT Stop Time  1355    PT Time Calculation (min)  44 min    Activity Tolerance  Patient tolerated treatment well    Behavior During Therapy  WFL for tasks assessed/performed       Past Medical History:  Diagnosis Date  . Allergy    seasonal  . Arthritis    "knee, left hip, ankles"   . Borderline glaucoma   . CAD (coronary artery disease)    Mild nonobstructive CAD by cath 02/06/13 (25% mid LAD), normal EF  . Family history of adverse reaction to anesthesia    mother has problems with nausea and vomiting   . Glaucoma   . History of colitis    02/ 2016  acute infectious colitis-- resolved  . Hyperlipidemia   . Left ovarian cyst   . Osteoporosis    ospenia of hips  . PONV (postoperative nausea and vomiting)    SEVERE    Past Surgical History:  Procedure Laterality Date  . CARPAL TUNNEL RELEASE Bilateral 1990's  . KNEE ARTHROSCOPY W/ MENISCECTOMY Left 07/2014  . LEFT HEART CATHETERIZATION WITH CORONARY ANGIOGRAM N/A 02/06/2013   Procedure: LEFT HEART CATHETERIZATION WITH CORONARY ANGIOGRAM;  Surgeon: [name] Hampshire, MD;  Location: [facility];  Service: Cardiovascular;  Laterality: N/A;  non-obstruction 25% mLAD,  normal LVF , ef 65-70%  . left knee meniscus surgery     . NASAL SINUS SURGERY    . ovarian cyst     left ovary  . ROTATOR CUFF REPAIR Right 10/2016  . TOTAL KNEE ARTHROPLASTY Left 03/26/2015   Procedure: LEFT TOTAL KNEE ARTHROPLASTY;  Surgeon: Paralee Cancel, MD;   Location: [facility];  Service: Orthopedics;  Laterality: Left;    There were no vitals filed for this visit.  Subjective Assessment - 12/11/19 1308    Subjective  COVID 19 screening performed on patient upon arrival. Patient reports she was discouraged by visit with Dr. Nelva Bush. Reports that she is waking multiple times a night due to pain. LLE completely numb to toes, no toe numbness on RLE.    Pertinent History  Right RTC repair, Left TKA, CTS.    How long can you stand comfortably?  10 minutes.    Diagnostic tests  Short community distances.    Patient Stated Goals  get out of pain and avoid surgery.    Currently in Pain?  Yes    Pain Score  6     Pain Location  Back    Pain Orientation  Right;Left;Lower    Pain Descriptors / Indicators  Discomfort    Pain Type  Acute pain    Pain Onset  More than a month ago    Pain Frequency  Constant    Multiple Pain Sites  Yes    Pain Score  8    Pain Location  Leg    Pain Orientation  Left;Right    Pain Descriptors /  Indicators  Discomfort;Numbness    Pain Type  Chronic pain    Pain Onset  More than a month ago    Pain Frequency  Constant         [facility] PT Assessment - 12/11/19 0001      Assessment   Medical Diagnosis  Spinal stenosis, lumbar region.    Referring Provider (PT)  Netta Cedars MD.      Precautions   Precautions  None      Restrictions   Weight Bearing Restrictions  No                   [facility] Adult PT Treatment/Exercise - 12/11/19 0001      Modalities   Modalities  Electrical Stimulation;Moist Heat;Ultrasound      Moist Heat Therapy   Number Minutes Moist Heat  15 Minutes    Moist Heat Location  Lumbar Spine      Electrical Stimulation   Electrical Stimulation Location  B low back    Electrical Stimulation Action  IFC    Electrical Stimulation Parameters  80-150 hz x15 min    Electrical Stimulation Goals  Pain      Ultrasound   Ultrasound Location  B QL, lumbar paraspinals    Ultrasound  Parameters  Combo 1.5 w/cm2, 100%, 1 mhz x10 min    Ultrasound Goals  Pain               PT Short Term Goals - 11/08/19 1621      PT SHORT TERM GOAL #1   Title  STG's=LTG's        PT Long Term Goals - 12/04/19 1110      PT LONG TERM GOAL #1   Title  Ind with HEP.    Time  4    Period  Weeks    Status  Achieved   Patient doing HEP and activity daily as able with good understanding of exercises 12/04/19     PT LONG TERM GOAL #2   Title  Stand 20 minutes with pain not > 3/10.    Time  4    Period  Weeks    Status  On-going   Patients pain at 6/10 today  12/04/19     PT LONG TERM GOAL #3   Title  Perform ADL's with pain not > 3/10.    Time  4    Period  Weeks    Status  On-going   patients pain level 6/10 and increases with prolong activity 12/04/19     PT LONG TERM GOAL #4   Title  Eliminate radiation of pain into right hip region.    Time  4    Period  Weeks    Status  On-going   symptoms continue at this time 12/04/19           Plan - 12/11/19 1342    Clinical Impression Statement  Patient presented in clinic with reports of continued LBP and BLE pain and numbness. Patient to possibly get another appointment with Dr. Rolena Infante to consult with him again. Moderate tone palpable in B low back. No reports of tenderness or pain reported during manual therapy. Normal modalities response noted following removal of the modalities. Patient reports limtaitons with sleep and ADLs secondary to the pain.    Personal Factors and Comorbidities  Comorbidity 1;Comorbidity 2    Comorbidities  Right RTC repair, Left TKA, CTS.    Examination-Activity Limitations  Stand;Locomotion Level;Other  Examination-Participation Restrictions  Cleaning;Other    Stability/Clinical Decision Making  Evolving/Moderate complexity    PT Frequency  3x / week    PT Duration  4 weeks    PT Treatment/Interventions  ADLs/Self Care Home Management;Cryotherapy;Electrical  Stimulation;Traction;Ultrasound;Moist Heat;Therapeutic activities;Therapeutic exercise;Manual techniques;Patient/family education;Dry needling;Spinal Manipulations    PT Next Visit Plan  Continue per MD request but possible hold or D/C due to symptoms.    Consulted and Agree with Plan of Care  Patient       Patient will benefit from skilled therapeutic intervention in order to improve the following deficits and impairments:  Pain, Decreased activity tolerance, Postural dysfunction, Decreased range of motion  Visit Diagnosis: Acute bilateral low back pain without sciatica     Problem List Patient Active Problem List   Diagnosis Date Noted  . Hypertension 11/20/2019  . Family history of early CAD 07/15/2017  . Elevated blood pressure reading in office without diagnosis of hypertension 07/15/2017  . Obese 03/27/2015  . S/P left TKA 03/26/2015  . S/P knee replacement 03/26/2015  . Colitis, acute 12/01/2014  . Weight loss, unintentional 12/01/2014  . Adnexal cyst 12/01/2014  . Elevated fasting glucose 12/01/2014  . CAD (coronary artery disease) 12/01/2014  . Osteoarthritis 12/01/2014  . Vasovagal attack 02/28/2013  . Hyperlipidemia 02/28/2013    Standley Brooking, PTA 12/11/2019, 2:36 PM  [facility] [address] [place], [place], [address] Phone: [phone]   Fax:  [phone]  Name: Carol Cox MRN: [id number] Date of Birth: 12/28/1950

## 2019-12-11 NOTE — Progress Notes (Signed)
Patient ID: [name]                 DOB: 1951/10/11                      MRN: [id number]     HPI: Carol Cox is a 69 y.o. female referred by Dr. Irish Lack  to HTN clinic. PMH is significant for CAD (mild nonobstructive CAD by cath 01/2013 w/ 25% mid LAD and normal EF), strong family history of premature CAD, non-cardiac chest pain, glaucoma, arthritis, HLD, and osteoporosis. Patient was last seen by Dr. Irish Lack on 11/06/19 and in the pharmacy clinic on 11/20/19 where her BP was still above goal of <130/80 so lisinopril was increased from 42m to 24mdaily. At the last visit, she reported persistent back pain that limits her activity, she takes ibuprofen for, and is awaiting back surgery. We encouraged limiting sodium in the diet and increasing exercise as tolerated and to follow up with [place] to assess blood pressure after increasing lisinopril, check a BMP, and to compare her home BP readings to ours in the office.   Ms. SiWendlandresents in good spirits bringing her home blood pressure readings and home blood pressure cuff. She reports she has been tolerating the lisinopril dose increase well and reports no dizziness or headaches. She has met with her surgeon and has been attending outpatient rehab for her back, though she reports minimal improvement and is still considering surgery. She has been receiving some shots to her back and no longer uses ibuprofen or naproxen, just acetaminophen and gabapentin for pain.   Daily home BP readings: 121/73, 139/81, 118/69, 130/63, 115/77, 125/77, 132/77  In office BP reading with home monitor: 145/93, 150/88, 145/88  Home BP readings are mostly at goal. Her blood pressure cuff reported higher readings than what we measured in the office. BP was measured in the office both manually and with an automatic cuff, on both arms, and was consistently at goal.    Current HTN meds: lisinopril 20 mg daily in the morning  BP goal: <130/80 mmHg  Family  History: The patient's family history includes CAD in her sister; CAD (age of onset: 4235in her maternal uncle; CAD (age of onset: 5040in her maternal uncle; CVA in her mother; Heart attack in her father; Heart attack (age of onset: 4271in her maternal uncle; Heart attack (age of onset: 5062in her maternal uncle; Heart attack (age of onset: 6534in her sister; Sudden death (age of onset: 5244in her father  Social History: no tobacco, alcohol, or tobacco use  Diet: 1-2 cups of coffee in the AM, trying to drink more water. Avoids soda. Trying to eat more vegetables and fiber, less sugar.  Exercise: Using her elliptical a bit more. Does have spinal stenosis and back pain that limit her activity. Typically does 2x 15 minutes sessions as tolerated. Outpatient physical therapy for her back.   Wt Readings from Last 3 Encounters:  11/06/19 203 lb 6.4 oz (92.3 kg)  10/08/19 195 lb (88.5 kg)  08/15/18 199 lb 3.2 oz (90.4 kg)   BP Readings from Last 3 Encounters:  11/24/19 131/80  11/20/19 (!) 140/96  11/06/19 (!) 160/98   Pulse Readings from Last 3 Encounters:  11/24/19 66  11/20/19 85  11/06/19 69    Renal function: CrCl cannot be calculated (Patient's most recent lab result is older than the maximum 21 days allowed.).  Past Medical  History:  Diagnosis Date  . Allergy    seasonal  . Arthritis    "knee, left hip, ankles"   . Borderline glaucoma   . CAD (coronary artery disease)    Mild nonobstructive CAD by cath 02/06/13 (25% mid LAD), normal EF  . Family history of adverse reaction to anesthesia    mother has problems with nausea and vomiting   . Glaucoma   . History of colitis    02/ 2016  acute infectious colitis-- resolved  . Hyperlipidemia   . Left ovarian cyst   . Osteoporosis    ospenia of hips  . PONV (postoperative nausea and vomiting)    SEVERE    Current Outpatient Medications on File Prior to Visit  Medication Sig Dispense Refill  . aspirin EC 81 MG tablet Take 81  mg by mouth daily.    . Calcium Carbonate-Vit D-Min (CALCIUM 1200 PO) Take by mouth daily.    . Calcium-Magnesium-Vitamin D (CALCIUM 1200+D3 PO) Take 1 tablet by mouth daily.    Marland Kitchen CALCIUM-MAGNESIUM-VITAMIN D PO Take 1 tablet by mouth daily.    . Cholecalciferol (VITAMIN D-3) 1000 units CAPS Take 1 capsule by mouth daily. Take 5000 units daily    . Coenzyme Q10 (COQ10 PO) Take 1 capsule by mouth daily.    Marland Kitchen ezetimibe-simvastatin (VYTORIN) 10-40 MG tablet TAKE 1 TABLET BY MOUTH EVERYDAY AT BEDTIME 90 tablet 0  . fish oil-omega-3 fatty acids 1000 MG capsule Take 1 g by mouth daily.      Marland Kitchen gabapentin (NEURONTIN) 100 MG capsule Take 100 mg by mouth 3 (three) times daily.    Marland Kitchen ibuprofen (ADVIL,MOTRIN) 600 MG tablet Take 1 tablet (600 mg total) by mouth every 6 (six) hours as needed. 30 tablet 0  . lisinopril (ZESTRIL) 20 MG tablet Take 1 tablet (20 mg total) by mouth daily. 30 tablet 11  . METROGEL 1 % gel Apply 1 application topically daily.     . Multiple Vitamin (MULTIVITAMIN) tablet Take 1 tablet by mouth daily.      . psyllium (METAMUCIL) 58.6 % powder Take 1 packet by mouth as needed.    . timolol (TIMOPTIC) 0.5 % ophthalmic solution Place 1 drop into both eyes 2 (two) times daily.  0  . Travoprost (TRAVATAN Z OP) Apply to eye. Use one drop in each eye daily     Current Facility-Administered Medications on File Prior to Visit  Medication Dose Route Frequency Provider Last Rate Last Admin  . 0.9 %  sodium chloride infusion  500 mL Intravenous Continuous Irene Shipper, MD        Allergies  Allergen Reactions  . Atorvastatin Other (See Comments)    _0    . Rosuvastatin Other (See Comments)    _1       Assessment/Plan:  1. Hypertension - BP in clinic and at home is at goal <130/6mHg. Checking BMET today to assess renal function and potassium after dose increase of lisinopril on 11/20/2019. She  will continue taking lisinopril 20 mg daily. Follow up in Pharmacy clinic as needed.  Pt seen with AEddie Candle PharmD PGY1 resident  MHarlon Flor Jennipher Weatherholtz, PharmD, BCACP, [place]. [name] [place] [address]Phone: [phone] Fax: ([phone]/15/2021 10:23 AM

## 2019-12-13 ENCOUNTER — Telehealth: Payer: Self-pay | Admitting: *Deleted

## 2019-12-13 ENCOUNTER — Encounter: Payer: Self-pay | Admitting: Physical Therapy

## 2019-12-13 ENCOUNTER — Other Ambulatory Visit: Payer: Self-pay

## 2019-12-13 ENCOUNTER — Ambulatory Visit: Payer: Medicare PPO | Admitting: Physical Therapy

## 2019-12-13 DIAGNOSIS — M545 Low back pain, unspecified: Secondary | ICD-10-CM

## 2019-12-13 NOTE — Therapy (Signed)
[facility] [place], [place], [address] Phone: [phone]   Fax:  [phone]  Physical Therapy Treatment  Patient Details  Name: Carol Cox MRN: [id number] Date of Birth: 1951-04-21 Referring Provider (PT): Netta Cedars MD.   Encounter Date: 12/13/2019  PT End of Session - 12/13/19 1108    Visit Number  11    Number of Visits  12    Date for PT Re-Evaluation  12/27/19    Authorization Type  FOTO.    PT Start Time  1037    PT Stop Time  1123    PT Time Calculation (min)  46 min    Activity Tolerance  Patient tolerated treatment well    Behavior During Therapy  WFL for tasks assessed/performed       Past Medical History:  Diagnosis Date  . Allergy    seasonal  . Arthritis    "knee, left hip, ankles"   . Borderline glaucoma   . CAD (coronary artery disease)    Mild nonobstructive CAD by cath 02/06/13 (25% mid LAD), normal EF  . Family history of adverse reaction to anesthesia    mother has problems with nausea and vomiting   . Glaucoma   . History of colitis    02/ 2016  acute infectious colitis-- resolved  . Hyperlipidemia   . Left ovarian cyst   . Osteoporosis    ospenia of hips  . PONV (postoperative nausea and vomiting)    SEVERE    Past Surgical History:  Procedure Laterality Date  . CARPAL TUNNEL RELEASE Bilateral 1990's  . KNEE ARTHROSCOPY W/ MENISCECTOMY Left 07/2014  . LEFT HEART CATHETERIZATION WITH CORONARY ANGIOGRAM N/A 02/06/2013   Procedure: LEFT HEART CATHETERIZATION WITH CORONARY ANGIOGRAM;  Surgeon: [name] Hampshire, MD;  Location: [facility];  Service: Cardiovascular;  Laterality: N/A;  non-obstruction 25% mLAD,  normal LVF , ef 65-70%  . left knee meniscus surgery     . NASAL SINUS SURGERY    . ovarian cyst     left ovary  . ROTATOR CUFF REPAIR Right 10/2016  . TOTAL KNEE ARTHROPLASTY Left 03/26/2015   Procedure: LEFT TOTAL KNEE ARTHROPLASTY;  Surgeon: Paralee Cancel, MD;   Location: [facility];  Service: Orthopedics;  Laterality: Left;    There were no vitals filed for this visit.  Subjective Assessment - 12/13/19 1034    Subjective  COVID 19 screening performed on patient upon arrival. Patient reports that Dr. Rolena Infante wants to schedule back surgery but has to have other appointments.    Pertinent History  Right RTC repair, Left TKA, CTS.    How long can you stand comfortably?  10 minutes.    Diagnostic tests  Short community distances.    Currently in Pain?  Yes    Pain Score  7     Pain Location  Back    Pain Orientation  Right;Left;Lower    Pain Descriptors / Indicators  Discomfort    Pain Type  Acute pain    Pain Onset  More than a month ago    Pain Frequency  Constant    Pain Location  Leg    Pain Orientation  Left;Right    Pain Descriptors / Indicators  Discomfort;Numbness    Pain Type  Chronic pain    Pain Onset  More than a month ago    Pain Frequency  Constant         [facility] PT Assessment - 12/13/19 0001  Assessment   Medical Diagnosis  Spinal stenosis, lumbar region.    Referring Provider (PT)  Netta Cedars MD.      Precautions   Precautions  None      Restrictions   Weight Bearing Restrictions  No                   [facility] Adult PT Treatment/Exercise - 12/13/19 0001      Modalities   Modalities  Electrical Stimulation;Moist Heat;Ultrasound      Moist Heat Therapy   Number Minutes Moist Heat  15 Minutes    Moist Heat Location  Lumbar Spine      Electrical Stimulation   Electrical Stimulation Location  B low back    Electrical Stimulation Action  IFC    Electrical Stimulation Parameters  80-150 hz x15 min    Electrical Stimulation Goals  Pain      Ultrasound   Ultrasound Location  B QL, lumbar paraspinals    Ultrasound Parameters  Combo 1.5 w/cm2, 100%, 1 mhz x10 min    Ultrasound Goals  Pain      Manual Therapy   Manual Therapy  Soft tissue mobilization    Soft tissue mobilization  STW to B QL, lumbar  paraspinals to reduce pain               PT Short Term Goals - 11/08/19 1621      PT SHORT TERM GOAL #1   Title  STG's=LTG's        PT Long Term Goals - 12/04/19 1110      PT LONG TERM GOAL #1   Title  Ind with HEP.    Time  4    Period  Weeks    Status  Achieved   Patient doing HEP and activity daily as able with good understanding of exercises 12/04/19     PT LONG TERM GOAL #2   Title  Stand 20 minutes with pain not > 3/10.    Time  4    Period  Weeks    Status  On-going   Patients pain at 6/10 today  12/04/19     PT LONG TERM GOAL #3   Title  Perform ADL's with pain not > 3/10.    Time  4    Period  Weeks    Status  On-going   patients pain level 6/10 and increases with prolong activity 12/04/19     PT LONG TERM GOAL #4   Title  Eliminate radiation of pain into right hip region.    Time  4    Period  Weeks    Status  On-going   symptoms continue at this time 12/04/19           Plan - 12/13/19 1212    Clinical Impression Statement  Patient presented in clinic with plan of lumbar surgery with Dr. Rolena Infante. Patient still limited with LBP and BLE pain and numbness. Minimal increased tone palpable in R lumbar paraspinals, QL. No reports of any tenderness reported during manual therapy today. Normal modalities response noted following removal of the modalities.    Personal Factors and Comorbidities  Comorbidity 1;Comorbidity 2    Comorbidities  Right RTC repair, Left TKA, CTS.    Examination-Activity Limitations  Stand;Locomotion Level;Other    Examination-Participation Restrictions  Cleaning;Other    Stability/Clinical Decision Making  Evolving/Moderate complexity    PT Frequency  3x / week    PT Duration  4 weeks  PT Treatment/Interventions  ADLs/Self Care Home Management;Cryotherapy;Electrical Stimulation;Traction;Ultrasound;Moist Heat;Therapeutic activities;Therapeutic exercise;Manual techniques;Patient/family education;Dry needling;Spinal Manipulations     PT Next Visit Plan  D/C next summary.    Consulted and Agree with Plan of Care  Patient       Patient will benefit from skilled therapeutic intervention in order to improve the following deficits and impairments:  Pain, Decreased activity tolerance, Postural dysfunction, Decreased range of motion  Visit Diagnosis: Acute bilateral low back pain without sciatica     Problem List Patient Active Problem List   Diagnosis Date Noted  . Hypertension 11/20/2019  . Family history of early CAD 07/15/2017  . Elevated blood pressure reading in office without diagnosis of hypertension 07/15/2017  . Obese 03/27/2015  . S/P left TKA 03/26/2015  . S/P knee replacement 03/26/2015  . Colitis, acute 12/01/2014  . Weight loss, unintentional 12/01/2014  . Adnexal cyst 12/01/2014  . Elevated fasting glucose 12/01/2014  . CAD (coronary artery disease) 12/01/2014  . Osteoarthritis 12/01/2014  . Vasovagal attack 02/28/2013  . Hyperlipidemia 02/28/2013    Standley Brooking, PTA 12/13/2019, 12:14 PM  [facility] [address] [place], [place], [address] Phone: [phone]   Fax:  [phone]  Name: Carol Cox MRN: [id number] Date of Birth: Apr 01, 1951

## 2019-12-13 NOTE — Telephone Encounter (Signed)
   [facility] Pre-operative Risk Assessment    Request for surgical clearance:  1. What type of surgery is being performed? L4-S1 FUSION ANTERIOR/POSTERIOR (PER [facility]GERY SCHEDULER THIS WILL BE A 2 DAYS SURGERY)  2. When is this surgery scheduled? TBD    3. What type of clearance is required (medical clearance vs. Pharmacy clearance to hold med vs. Both)? MEDICAL  4. Are there any medications that need to be held prior to surgery and how long? ASA   5. Practice name and name of physician performing surgery? [facility]; DR. Pingree Grove   6. What is your office phone number [phone]    7.   What is your office fax number [phone]  8.   Anesthesia type (None, local, MAC, general) ? GENERAL   Carol Cox 12/13/2019, 3:34 PM  _________________________________________________________________   (provider comments below)

## 2019-12-14 NOTE — Telephone Encounter (Signed)
   Primary Cardiologist: Larae Grooms, MD  Chart reviewed as part of pre-operative protocol coverage. Patient was contacted 12/14/2019 in reference to pre-operative risk assessment for pending surgery as outlined below.  Carol Cox was last seen on 11/06/19 by Dr. Irish Lack.  Since that day, Carol Cox has done well.  She is limited by limb pain related to her need for back surgery, but can still complete more than 4.0 METS without angina.   Recommend continuing ASA throughout the perioperative period. However, if deemed absolutely necessary, may hold 5-7 days.  Therefore, based on ACC/AHA guidelines, the patient would be at acceptable risk for the planned procedure without further cardiovascular testing.   I will route this recommendation to the requesting party via Epic fax function and remove from pre-op pool.  Please call with questions.  Ledora Bottcher, PA 12/14/2019, 11:49 AM  '

## 2019-12-14 NOTE — Telephone Encounter (Signed)
Left VM

## 2019-12-18 ENCOUNTER — Ambulatory Visit: Payer: Medicare PPO | Admitting: Physical Therapy

## 2019-12-18 ENCOUNTER — Other Ambulatory Visit: Payer: Self-pay

## 2019-12-18 DIAGNOSIS — M545 Low back pain, unspecified: Secondary | ICD-10-CM

## 2019-12-18 NOTE — Therapy (Signed)
[facility] [place], [place], [address] Phone: [phone]   Fax:  [phone]  Physical Therapy Treatment  Patient Details  Name: Carol Cox MRN: [id number] Date of Birth: 1951/04/06 Referring Provider (PT): Netta Cedars MD.   Encounter Date: 12/18/2019  PT End of Session - 12/18/19 1123    Visit Number  12    Number of Visits  12    Date for PT Re-Evaluation  12/27/19    Authorization Type  FOTO.    PT Stop Time  1032    Activity Tolerance  Patient tolerated treatment well    Behavior During Therapy  [facility] for tasks assessed/performed       Past Medical History:  Diagnosis Date  . Allergy    seasonal  . Arthritis    "knee, left hip, ankles"   . Borderline glaucoma   . CAD (coronary artery disease)    Mild nonobstructive CAD by cath 02/06/13 (25% mid LAD), normal EF  . Family history of adverse reaction to anesthesia    mother has problems with nausea and vomiting   . Glaucoma   . History of colitis    02/ 2016  acute infectious colitis-- resolved  . Hyperlipidemia   . Left ovarian cyst   . Osteoporosis    ospenia of hips  . PONV (postoperative nausea and vomiting)    SEVERE    Past Surgical History:  Procedure Laterality Date  . CARPAL TUNNEL RELEASE Bilateral 1990's  . KNEE ARTHROSCOPY W/ MENISCECTOMY Left 07/2014  . LEFT HEART CATHETERIZATION WITH CORONARY ANGIOGRAM N/A 02/06/2013   Procedure: LEFT HEART CATHETERIZATION WITH CORONARY ANGIOGRAM;  Surgeon: [name] Hampshire, MD;  Location: [facility];  Service: Cardiovascular;  Laterality: N/A;  non-obstruction 25% mLAD,  normal LVF , ef 65-70%  . left knee meniscus surgery     . NASAL SINUS SURGERY    . ovarian cyst     left ovary  . ROTATOR CUFF REPAIR Right 10/2016  . TOTAL KNEE ARTHROPLASTY Left 03/26/2015   Procedure: LEFT TOTAL KNEE ARTHROPLASTY;  Surgeon: Paralee Cancel, MD;  Location: [facility];  Service: Orthopedics;  Laterality: Left;     There were no vitals filed for this visit.  Subjective Assessment - 12/18/19 1112    Subjective  COVID-19 screen performed prior to patient entering clinic.  last day.    Pertinent History  Right RTC repair, Left TKA, CTS.    How long can you stand comfortably?  10 minutes.    Diagnostic tests  Short community distances.    Patient Stated Goals  get out of pain and avoid surgery.    Currently in Pain?  Yes    Pain Score  7     Pain Location  Back    Pain Orientation  Right;Left;Lower    Pain Onset  More than a month ago    Pain Score  8    Pain Location  Leg    Pain Orientation  Right    Pain Descriptors / Indicators  Numbness;Discomfort    Pain Type  Chronic pain                       [facility] Adult PT Treatment/Exercise - 12/18/19 0001      Exercises   Exercises  Knee/Hip      Lumbar Exercises: Aerobic   Nustep  L 3 x 13 minutes.      Modalities   Modalities  Electrical Stimulation;Ultrasound      Moist Heat Therapy   Number Minutes Moist Heat  20 Minutes    Moist Heat Location  Lumbar Spine      Electrical Stimulation   Electrical Stimulation Location  Right low back.    Electrical Stimulation Action  Pre-mod.    Electrical Stimulation Parameters  80-150 Hz x 20 minutes.      Ultrasound   Ultrasound Location  Right low back.    Ultrasound Parameters  Combo e'stim/U/S at 1.50 W/CM2 x 10 minutes.    Ultrasound Goals  Pain               PT Short Term Goals - 11/08/19 1621      PT SHORT TERM GOAL #1   Title  STG's=LTG's        PT Long Term Goals - 12/18/19 1121      PT LONG TERM GOAL #1   Title  Ind with HEP.    Time  4    Period  Weeks    Status  Achieved      PT LONG TERM GOAL #2   Title  Stand 20 minutes with pain not > 3/10.    Time  4    Period  Weeks    Status  Not Met      PT LONG TERM GOAL #3   Title  Perform ADL's with pain not > 3/10.    Time  4    Period  Weeks    Status  Not Met            Plan -  12/18/19 1120    Clinical Impression Statement  See discharge summary.    Personal Factors and Comorbidities  Comorbidity 1;Comorbidity 2    Comorbidities  Right RTC repair, Left TKA, CTS.    Examination-Activity Limitations  Stand;Locomotion Level;Other    Examination-Participation Restrictions  Cleaning;Other    Stability/Clinical Decision Making  Evolving/Moderate complexity    PT Frequency  3x / week    PT Duration  4 weeks    PT Treatment/Interventions  ADLs/Self Care Home Management;Cryotherapy;Electrical Stimulation;Traction;Ultrasound;Moist Heat;Therapeutic activities;Therapeutic exercise;Manual techniques;Patient/family education;Dry needling;Spinal Manipulations    PT Next Visit Plan  D/C next summary.    Consulted and Agree with Plan of Care  Patient       Patient will benefit from skilled therapeutic intervention in order to improve the following deficits and impairments:  Pain, Decreased activity tolerance, Postural dysfunction, Decreased range of motion  Visit Diagnosis: Acute bilateral low back pain without sciatica     Problem List Patient Active Problem List   Diagnosis Date Noted  . Hypertension 11/20/2019  . Family history of early CAD 07/15/2017  . Elevated blood pressure reading in office without diagnosis of hypertension 07/15/2017  . Obese 03/27/2015  . S/P left TKA 03/26/2015  . S/P knee replacement 03/26/2015  . Colitis, acute 12/01/2014  . Weight loss, unintentional 12/01/2014  . Adnexal cyst 12/01/2014  . Elevated fasting glucose 12/01/2014  . CAD (coronary artery disease) 12/01/2014  . Osteoarthritis 12/01/2014  . Vasovagal attack 02/28/2013  . Hyperlipidemia 02/28/2013   PHYSICAL THERAPY DISCHARGE SUMMARY  Visits from Start of Care: 12.  Current functional level related to goals / functional outcomes: See above.   Remaining deficits: Continued low back pain and right LE pain.   Education / Equipment: HEP. Plan: Patient agrees to  discharge.  Patient goals were partially met. Patient is being discharged due to lack of progress.  ?????  Jamine Wingate, [place] MPT 12/18/2019, 11:24 AM  [facility] [address] [place], [place], [address] Phone: [phone]   Fax:  [phone]  Name: Carol Cox MRN: [id number] Date of Birth: 19-Jan-1951

## 2020-01-15 ENCOUNTER — Other Ambulatory Visit: Payer: Self-pay | Admitting: Interventional Cardiology

## 2020-03-29 ENCOUNTER — Other Ambulatory Visit: Payer: Self-pay

## 2020-04-02 ENCOUNTER — Ambulatory Visit: Payer: Self-pay | Admitting: Orthopedic Surgery

## 2020-04-10 NOTE — Progress Notes (Signed)
Chart abstract and updates done for upcoming appt to discuss Alif with Dr Donnetta Hutching 05/01/20  [place]

## 2020-04-16 ENCOUNTER — Encounter: Payer: Self-pay | Admitting: Vascular Surgery

## 2020-04-16 ENCOUNTER — Ambulatory Visit: Payer: Medicare PPO | Admitting: Vascular Surgery

## 2020-04-16 ENCOUNTER — Other Ambulatory Visit: Payer: Self-pay

## 2020-04-16 VITALS — BP 132/85 | HR 68 | Temp 98.0°F | Resp 18 | Ht 64.25 in | Wt 199.3 lb

## 2020-04-16 DIAGNOSIS — M5137 Other intervertebral disc degeneration, lumbosacral region: Secondary | ICD-10-CM | POA: Diagnosis not present

## 2020-04-16 NOTE — Progress Notes (Signed)
[facility]  Patient name: Carol Cox MRN: [id number] DOB: Apr 15, 1951 Sex: female  REASON FOR CONSULT: Discuss anterior exposure for L4-5 and L5-S1 disc surgery with Dr. Rolena Infante  HPI: Carol Cox is a 69 y.o. female, who is here for discussion of my role in anterior exposure for L4-5 and L5-S1 disc surgery.  She is very uncomfortable related to her back pain.  She is uncomfortable even sitting in her exam chair.  She reports this is been progressive over the last several years.  She has pain when she is sitting standing or lying.  This is both in her low back and then extends down into her hips and buttocks bilaterally.  She is failed conservative therapy to include spinal injections.  Dr. Rolena Infante is recommended anterior and posterior instrumentation and we are consulted for discussion of anterior exposure for L4-5 and L5-S1 fusion.  She has no history of peripheral vascular occlusive disease.  Did have mild disease in the coronary catheterization in 2014  Past Medical History:  Diagnosis Date  . Allergy    seasonal  . Arthritis    "knee, left hip, ankles"   . Borderline glaucoma   . CAD (coronary artery disease)    Mild nonobstructive CAD by cath 02/06/13 (25% mid LAD), normal EF  . Chronic low back pain   . Colitis   . Coronary arteriosclerosis   . DDD (degenerative disc disease), lumbar   . Degeneration of intervertebral disc of lumbar region   . Family history of adverse reaction to anesthesia    mother has problems with nausea and vomiting   . Glaucoma   . History of colitis    02/ 2016  acute infectious colitis-- resolved  . History of total knee arthroplasty   . Hyperglycemia   . Hyperlipidemia   . Left ovarian cyst   . Low back pain    with bilateral hip radiation   . Lumbar radiculopathy   . Lumbar spondylolysis   . Obesity   . Osteoporosis    ospenia of hips  . Pain in joint of left shoulder     . Pain of right hip joint   . PONV (postoperative nausea and vomiting)    SEVERE    Family History  Problem Relation Age of Onset  . Sudden death Father 48       Presumed heart attack  . Heart attack Father   . CVA Mother   . CAD Maternal Uncle 93  . CAD Maternal Uncle 46  . Heart attack Sister 26  . CAD Sister   . Heart attack Maternal Uncle 42  . Heart attack Maternal Uncle 50    SOCIAL HISTORY: Social History   Socioeconomic History  . Marital status: Married    Spouse name: Not on file  . Number of children: 3  . Years of education: Not on file  . Highest education level: Not on file  Occupational History  . Occupation: Retired    Comment: Pharmacist, hospital  Tobacco Use  . Smoking status: Never Smoker  . Smokeless tobacco: Never Used  Vaping Use  . Vaping Use: Never used  Substance and Sexual Activity  . Alcohol use: No  . Drug use: No  . Sexual activity: Yes  Other Topics Concern  . Not on file  Social History Narrative   Lives at home with husband. 4 grandchildren.     Social Determinants of Health   Financial Resource Strain:   .  Difficulty of Paying Living Expenses:   Food Insecurity:   . Worried About Charity fundraiser in the Last Year:   . Arboriculturist in the Last Year:   Transportation Needs:   . Film/video editor (Medical):   Marland Kitchen Lack of Transportation (Non-Medical):   Physical Activity:   . Days of Exercise per Week:   . Minutes of Exercise per Session:   Stress:   . Feeling of Stress :   Social Connections:   . Frequency of Communication with Friends and Family:   . Frequency of Social Gatherings with Friends and Family:   . Attends Religious Services:   . Active Member of Clubs or Organizations:   . Attends Archivist Meetings:   Marland Kitchen Marital Status:   Intimate Partner Violence:   . Fear of Current or Ex-Partner:   . Emotionally Abused:   Marland Kitchen Physically Abused:   . Sexually Abused:     Allergies  Allergen Reactions  .  Atorvastatin Other (See Comments)    Leg pain Leg pain Leg pain Leg pain Leg pain   . Rosuvastatin Other (See Comments)    Leg pain Leg pain Leg pain Leg pain Leg pain     Current Outpatient Medications  Medication Sig Dispense Refill  . aspirin EC 81 MG tablet Take 81 mg by mouth daily.    . Calcium-Magnesium-Vitamin D (CALCIUM 1200+D3 PO) Take 1 tablet by mouth daily.    . Cholecalciferol (VITAMIN D-3) 1000 units CAPS Take 1 capsule by mouth daily. Take 5000 units daily    . Coenzyme Q10 (COQ10 PO) Take 1 capsule by mouth daily.    Marland Kitchen ezetimibe-simvastatin (VYTORIN) 10-40 MG tablet TAKE 1 TABLET BY MOUTH EVERYDAY AT BEDTIME 90 tablet 2  . fish oil-omega-3 fatty acids 1000 MG capsule Take 1 g by mouth daily.      Marland Kitchen gabapentin (NEURONTIN) 100 MG capsule Take 100 mg by mouth 3 (three) times daily.    Marland Kitchen lisinopril (ZESTRIL) 20 MG tablet Take 1 tablet (20 mg total) by mouth daily. 30 tablet 11  . METROGEL 1 % gel Apply 1 application topically daily.     . Multiple Vitamin (MULTIVITAMIN) tablet Take 1 tablet by mouth daily.      . psyllium (METAMUCIL) 58.6 % powder Take 1 packet by mouth as needed.    . timolol (TIMOPTIC) 0.5 % ophthalmic solution Place 1 drop into both eyes 2 (two) times daily.  0  . Travoprost (TRAVATAN Z OP) Apply to eye. Use one drop in each eye daily    . ibuprofen (ADVIL,MOTRIN) 600 MG tablet Take 1 tablet (600 mg total) by mouth every 6 (six) hours as needed. (Patient not taking: Reported on 12/11/2019) 30 tablet 0   Current Facility-Administered Medications  Medication Dose Route Frequency Provider Last Rate Last Admin  . 0.9 %  sodium chloride infusion  500 mL Intravenous Continuous Irene Shipper, MD        REVIEW OF SYSTEMS:  [X]  denotes positive finding, [ ]  denotes negative finding Cardiac  Comments:  Chest pain or chest pressure:    Shortness of breath upon exertion:    Short of breath when lying flat:    Irregular heart rhythm:        Vascular      Pain in calf, thigh, or hip brought on by ambulation: x  neurogenic  Pain in feet at night that wakes you up from your sleep:  Blood clot in your veins:    Leg swelling:         Pulmonary    Oxygen at home:    Productive cough:     Wheezing:         Neurologic    Sudden weakness in arms or legs:     Sudden numbness in arms or legs:     Sudden onset of difficulty speaking or slurred speech:    Temporary loss of vision in one eye:     Problems with dizziness:         Gastrointestinal    Blood in stool:     Vomited blood:         Genitourinary    Burning when urinating:     Blood in urine:        Psychiatric    Major depression:         Hematologic    Bleeding problems:    Problems with blood clotting too easily:        Skin    Rashes or ulcers:        Constitutional    Fever or chills:      PHYSICAL EXAM: Vitals:   04/16/20 1039  BP: 132/85  Pulse: 68  Resp: 18  Temp: 98 F (36.7 C)  TempSrc: Temporal  SpO2: 97%  Weight: 199 lb 4.8 oz (90.4 kg)  Height: 5' 4.25" (1.632 m)    GENERAL: The patient is a well-nourished female, in no acute distress. The vital signs are documented above. CARDIOVASCULAR: Carotid arteries without bruits bilaterally.  2+ radial and 2+ dorsalis pedis pulses bilaterally PULMONARY: There is good air exchange  ABDOMEN: Soft and non-tender  MUSCULOSKELETAL: There are no major deformities or cyanosis. NEUROLOGIC: No focal weakness or paresthesias are detected. SKIN: There are no ulcers or rashes noted. PSYCHIATRIC: The patient has a normal affect.  DATA:  I reviewed her prior abdominal CT from February 2016.  This showed no aortoiliac calcification at that time.  Also reviewed her current MRI showing normal location of aortic and caval bifurcation  MEDICAL ISSUES: Had a long discussion with the patient regarding my role for exposure.  Explained that Dr. Rolena Infante is recommended surgery for improvement in her symptoms and portion of  this from anterior approach.  I explained a left paramedian incision with mobilization of the rectus muscle.  Also explained mobilization of intraperitoneal contents with a retroperitoneal exposure and mobilization of the left ureter.  Also explained mobilization of arterial venous structures overlying the spine.  I discussed potential injury to all of these in particular discussed potential venous injury.  I do not see any contraindications to the planned procedure.  She is not morbidly obese with a BMI of 34.  Her only prior intra-abdominal surgery was laparoscopic ovarian surgery.  She has no evidence of arterial calcification on her prior imaging and normal arterial flow to her feet.  Plan we will proceed with surgery as scheduled at [facility]   Rosetta Posner, MD [facility] Tel [phone] Pager [phone]

## 2020-04-22 ENCOUNTER — Ambulatory Visit: Payer: Self-pay | Admitting: Orthopedic Surgery

## 2020-04-22 NOTE — Progress Notes (Signed)
[place], [place] [address] [place] [address] Phone: [phone] Fax: [phone]  [id number] - [place], [place] [address] [place] [address] Phone: [phone] Fax: [phone]      Your procedure is scheduled on July 7   Report to [facility] Main Entrance "A" at [place].M., and check in at the Admitting office.  Call this number if you have problems the morning of surgery:  [phone]  Call [phone] if you have any questions prior to your surgery date Monday-Friday 8am-4pm    Remember:  Do not eat or drink after midnight the night before your surgery     Take these medicines the morning of surgery with A SIP OF WATER  dorzolamide-timolol (COSOPT) gabapentin (NEURONTIN) timolol (TIMOPTIC) traMADol (ULTRAM)  As of today, STOP taking any Aspirin (unless otherwise instructed by your surgeon) and Aspirin containing products, Aleve, Naproxen, Ibuprofen, Motrin, Advil, Goody's, BC's, all herbal medications, fish oil, and all vitamins.                      Do not wear jewelry, make up, or nail polish            Do not wear lotions, powders, perfumes, or deodorant.            Do not shave 48 hours prior to surgery.              Do not bring valuables to the hospital.            [facility] is not responsible for any belongings or valuables.  Do NOT Smoke (Tobacco/Vapping) or drink Alcohol 24 hours prior to your procedure If you use a CPAP at night, you may bring all equipment for your overnight stay.   Contacts, glasses, dentures or bridgework may not be worn into surgery.      For patients admitted to the hospital, discharge time will be determined by your treatment team.   Patients discharged the day of surgery will not be allowed to drive home, and someone needs to stay with them for 24 hours.    Special instructions:   [facility]- Preparing For Surgery  Before surgery, you  can play an important role. Because skin is not sterile, your skin needs to be as free of germs as possible. You can reduce the number of germs on your skin by washing with CHG (chlorahexidine gluconate) Soap before surgery.  CHG is an antiseptic cleaner which kills germs and bonds with the skin to continue killing germs even after washing.    Oral Hygiene is also important to reduce your risk of infection.  Remember - BRUSH YOUR TEETH THE MORNING OF SURGERY WITH YOUR REGULAR TOOTHPASTE  Please do not use if you have an allergy to CHG or antibacterial soaps. If your skin becomes reddened/irritated stop using the CHG.  Do not shave (including legs and underarms) for at least 48 hours prior to first CHG shower. It is OK to shave your face.  Please follow these instructions carefully.   1. Shower the NIGHT BEFORE SURGERY and the MORNING OF SURGERY with CHG Soap.   2. If you chose to wash your hair, wash your hair first as usual with your normal shampoo.  3. After you shampoo, rinse your hair and body thoroughly to remove the shampoo.  4. Use CHG as you would any other liquid soap. You can  apply CHG directly to the skin and wash gently with a scrungie or a clean washcloth.   5. Apply the CHG Soap to your body ONLY FROM THE NECK DOWN.  Do not use on open wounds or open sores. Avoid contact with your eyes, ears, mouth and genitals (private parts). Wash Face and genitals (private parts)  with your normal soap.   6. Wash thoroughly, paying special attention to the area where your surgery will be performed.  7. Thoroughly rinse your body with warm water from the neck down.  8. DO NOT shower/wash with your normal soap after using and rinsing off the CHG Soap.  9. Pat yourself dry with a CLEAN TOWEL.  10. Wear CLEAN PAJAMAS to bed the night before surgery, wear comfortable clothes the morning of surgery  11. Place CLEAN SHEETS on your bed the night of your first shower and DO NOT SLEEP WITH  PETS.   Day of Surgery:   Do not apply any deodorants/lotions.  Please wear clean clothes to the hospital/surgery center.   Remember to brush your teeth WITH YOUR REGULAR TOOTHPASTE.   Please read over the following fact sheets that you were given.

## 2020-04-22 NOTE — H&P (Signed)
Subjective:   Carol Cox is a pleasant 69 year old female who has chronic low back pain, but for the last 5 months has had severe debilitating back buttock and radicular left leg pain. She is beginning to have neuropathic pain in the right leg but it is not as severe as her left. Attempts at conservative care includint OTC meds, gabapentin, and opioids have failed. Unfortuantely the patient does not tolerate NSAIDs well (raise BP), does not tolerate gabapentin well (feels in a fog), and she cannot have steroids dur to glaucoma. Therefore, she woulld like to move forward with surgical intervention. The patient is here today for a pre-operative History and Physical. They are scheduled for day 1 ALIF L4-S1, day 2 PSFI on 05/01/20 and 05/02/20 with Dr. Rolena Infante at [facility].  Patient Active Problem List   Diagnosis Date Noted  . Hypertension 11/20/2019  . Family history of early CAD 07/15/2017  . Elevated blood pressure reading in office without diagnosis of hypertension 07/15/2017  . Obese 03/27/2015  . S/P left TKA 03/26/2015  . S/P knee replacement 03/26/2015  . Colitis, acute 12/01/2014  . Weight loss, unintentional 12/01/2014  . Adnexal cyst 12/01/2014  . Elevated fasting glucose 12/01/2014  . CAD (coronary artery disease) 12/01/2014  . Osteoarthritis 12/01/2014  . Vasovagal attack 02/28/2013  . Hyperlipidemia 02/28/2013   Past Medical History:  Diagnosis Date  . Allergy    seasonal  . Arthritis    "knee, left hip, ankles"   . Borderline glaucoma   . CAD (coronary artery disease)    Mild nonobstructive CAD by cath 02/06/13 (25% mid LAD), normal EF  . Chronic low back pain   . Colitis   . Coronary arteriosclerosis   . DDD (degenerative disc disease), lumbar   . Degeneration of intervertebral disc of lumbar region   . Family history of adverse reaction to anesthesia    mother has problems with nausea and vomiting   . Glaucoma   . History of colitis    02/ 2016  acute infectious  colitis-- resolved  . History of total knee arthroplasty   . Hyperglycemia   . Hyperlipidemia   . Left ovarian cyst   . Low back pain    with bilateral hip radiation   . Lumbar radiculopathy   . Lumbar spondylolysis   . Obesity   . Osteoporosis    ospenia of hips  . Pain in joint of left shoulder   . Pain of right hip joint   . PONV (postoperative nausea and vomiting)    SEVERE    Past Surgical History:  Procedure Laterality Date  . CARPAL TUNNEL RELEASE Bilateral 1990's  . KNEE ARTHROSCOPY W/ MENISCECTOMY Left 07/2014  . LEFT HEART CATHETERIZATION WITH CORONARY ANGIOGRAM N/A 02/06/2013   Procedure: LEFT HEART CATHETERIZATION WITH CORONARY ANGIOGRAM;  Surgeon: [name] Hampshire, MD;  Location: [facility];  Service: Cardiovascular;  Laterality: N/A;  non-obstruction 25% mLAD,  normal LVF , ef 65-70%  . left knee meniscus surgery     . NASAL SINUS SURGERY    . ovarian cyst     left ovary  . ROTATOR CUFF REPAIR Right 10/2016  . TOTAL KNEE ARTHROPLASTY Left 03/26/2015   Procedure: LEFT TOTAL KNEE ARTHROPLASTY;  Surgeon: Paralee Cancel, MD;  Location: [facility];  Service: Orthopedics;  Laterality: Left;    Current Outpatient Medications  Medication Sig Dispense Refill Last Dose  . aspirin EC 81 MG tablet Take 81 mg by mouth daily.     Marland Kitchen  Calcium-Magnesium-Vitamin D (CALCIUM 1200+D3 PO) Take 1 tablet by mouth daily.     . Cholecalciferol (VITAMIN D-3) 1000 units CAPS Take 1,000 Units by mouth daily.      . Coenzyme Q10 (COQ10 PO) Take 1 capsule by mouth daily. (Patient not taking: Reported on 04/17/2020)     . dorzolamide-timolol (COSOPT) 22.3-6.8 MG/ML ophthalmic solution Place 1 drop into both eyes in the morning and at bedtime.     Marland Kitchen ezetimibe-simvastatin (VYTORIN) 10-40 MG tablet TAKE 1 TABLET BY MOUTH EVERYDAY AT BEDTIME (Patient taking differently: Take 1 tablet by mouth 3 (three) times a week. ) 90 tablet 2   . fish oil-omega-3 fatty acids 1000 MG capsule Take 1 g by mouth daily.        Marland Kitchen gabapentin (NEURONTIN) 300 MG capsule Take 300 mg by mouth 2 (two) times daily.      Marland Kitchen ibuprofen (ADVIL,MOTRIN) 600 MG tablet Take 1 tablet (600 mg total) by mouth every 6 (six) hours as needed. (Patient not taking: Reported on 12/11/2019) 30 tablet 0   . lisinopril (ZESTRIL) 20 MG tablet Take 1 tablet (20 mg total) by mouth daily. 30 tablet 11   . METROGEL 1 % gel Apply 1 application topically daily.      . Multiple Vitamin (MULTIVITAMIN) tablet Take 1 tablet by mouth daily.       . psyllium (METAMUCIL) 58.6 % powder Take 1 packet by mouth as needed. (Patient not taking: Reported on 04/17/2020)     . timolol (TIMOPTIC) 0.5 % ophthalmic solution Place 1 drop into both eyes 2 (two) times daily. (Patient not taking: Reported on 04/17/2020)  0   . traMADol (ULTRAM) 50 MG tablet Take 50 mg by mouth 3 (three) times daily as needed for moderate pain.      . Travoprost, BAK Free, (TRAVATAN Z) 0.004 % SOLN ophthalmic solution Place 1 drop into both eyes at bedtime.       Current Facility-Administered Medications  Medication Dose Route Frequency Provider Last Rate Last Admin  . 0.9 %  sodium chloride infusion  500 mL Intravenous Continuous Irene Shipper, MD       Allergies  Allergen Reactions  . Atorvastatin Other (See Comments)    Leg pain  . Rosuvastatin Other (See Comments)    Leg pain    Social History   Tobacco Use  . Smoking status: Never Smoker  . Smokeless tobacco: Never Used  Substance Use Topics  . Alcohol use: No    Family History  Problem Relation Age of Onset  . Sudden death Father 64       Presumed heart attack  . Heart attack Father   . CVA Mother   . CAD Maternal Uncle 10  . CAD Maternal Uncle 27  . Heart attack Sister 51  . CAD Sister   . Heart attack Maternal Uncle 42  . Heart attack Maternal Uncle 50    Review of Systems As stated in HPI  Objective:   Vitals: Ht: 5 ft 6 in 04/22/2020 10:45 am BP: 134/84 04/22/2020 10:54 am  General: AAOX3, well developed  and well nourished, She appears unable to find a comfortable position alternating sitting to standing, maintaining a forward flexed position for comfort.  Ambulation: antalgic gait pattern, uses no assistive device.  Inspection: No obvious deformity, scoleosis, kyphosis, loss of lordotic curve.  Heart: Regular rate and rhythm, no rubs, or gallops  Lungs: Clear to auscultation bilaterally  Abdomen: Bowel sounds 4. Nontender, no rebound  tenderness. No loss of bladder or bowel control.  Palpation: Tender over spinous processes Of lumbar spine. Tender over bilateral SI joints right side greater thhan left.  AROM: - Knee: flexion and extension normal and pain free bilaterally. - Ankle: Dorsiflexion, plantarflexion, inversion, eversion normal and pain free.  Dermatomes: Lower extremity sensation to light touch intact bilaterally Intermittent radicular posterior lateral right thigh pain.  Myotomes: - Hip Flexion: Left 5/5, Right 5/5 - Knee Extension: Left 5/5, Right 5/5 - Ankle Dorsiflextion: Left 5/5, Right 5/5 - Ankle Plantarflexion: Left 5/5, Right 5/5  Reflexes: - Patella: Left1+, Right 1+ - Achilles: Left1+, Right 1+ - Babinski: Left Ngative, Right Negative - Clonus: Negative  Special Tests: - Straight Leg Raise: Left Negative, Right Negative - FABER: Left: Positive, Right Positive  PV: Extremities warm and well profused. Posterior and dorsalis pedis pulse 2+ bilaterally, No pitting Edema, discoloration, calf tendernes.  L5-S1 intradiscal injection: She reports temporary significant improvement in her pain in quality-of-life. She is able to increase her activities of daily living but unfortunately the pain has returned. She continues to have significant back buttock and bilateral lower extremity dysesthesias and pain.  Lumbar x-rays taken in the office include AP lateral and flexion extension views. Patient has increased anterior listhesis at L4-5 on flexion extension views.  Patient has advanced degenerative disc disease L4-5 and L5-S1. She has moderate degenerative disease L2-3 and L3-4. No fracture is seen. No scoliosis is noted.  MRI Impression: MRI of lumbar spine dated 10/21/19/20. There is moderate central by foraminal stenosis at L2-3 moderate central, mild right and moderate left foraminal stenosis at L4-5. There is prominent disc narrowing throughout the lumbar spine, the worst level is L5-S1. There is also facet arthrosis and short pedicles throughout the lumbar spine.  Updated MRI of lumbar spine from April 11, 2020 show no significant change compared to previous MRI  Assessment:   Carol Cox is a very pleasant 69 year old with severe debilitating back buttock and radicular left leg pain. She is beginning to have neuropathic pain in the right leg but it is not as severe as her left. Clinical exam demonstrates dysesthesias and pain primarily in the L5 and the S1 dermatome. Based on the intradiscal injection I do believe L5-S1 is her primary pain provider. However she does have left foraminal stenosis and lateral recess stenosis at L4-5 along with an anterior listhesis which can be contributing to the back and radicular leg pain. Initially, we discussed just moving forward with a L5-S1 fusion but in discussions today and reevaluating her imaging studies I am now recommending a 2 level spinal fusion. While she does have marked degenerative disease at L2-3 and L3-4 she is not having significant neuropathic pain related to these levels. My concern with just doing a 1 level is that the spondylolisthesis can intensify and her stenosis can worsen at the L4-5 level. I have had a long discussion with her today about a single versus 2 level fusion in the potential in the future for additional fusions for adjacent segment disease. She is aware of this and the fact that she is not going to have 0 out of 10 pain. Our goal is to decrease not eliminate her back and radicular leg pain and  hopefully improve her quality-of-life. I have gone over the surgery in great detail. The first day will be an anterior lumbar interbody fusion L4-5 and L5-S1. The second day is the supplemental posterior pedicle screw fixation L4-S1.  Plan:   All the risks  and benefits of the surgery with her in great detail and she is expressed an understanding. Because of the multilevel nature of the procedure I am recommending an external bone stimulator to be used postoperatively. Anterior lumbar interbody fusion risks: Risks and benefits of surgery were discussed with the patient. These include: Infection, bleeding, death, stroke, paralysis, ongoing or worse pain, need for additional surgery, nonunion, leak of spinal fluid, adjacent segment degeneration requiring additional fusion surgery, need for posterior decompression and/or fusion. Bleeding from major vessels, and blood clots (deep venous thrombosis)requiring additional treatment, loss in bowel and bladder control, injury to bladder, ureters, and other major abdominal organs. Additional risk for female patients: Retrograde ejaculation, and therefore infertility. Posterior spinal fusion risks: Risks and benefits of spinal fusion: Infection, bleeding, death, stroke, paralysis, ongoing or worse pain, need for additional surgery, nonunion, leak of spinal fluid, adjacent segment degeneration requiring additional fusion surgery, Injury to abdominal vessels that can require anterior surgery to stop bleeding. Malposition of the cage and/or pedicle screws that could require additional surgery. Loss of bowel and bladder control. Postoperative hematoma causing neurologic compression that could require urgent or emergent re-operation.  We have obtained preoperative medical clearance from the patient's primary care provider as well as the patient's cardiologist.  Patient has been seen and evaluated by Dr. early the vascular surgeon for the approach who does not have any  contraindications to the surgery as planned.  I have reviewed the patient's medication list with her. She will hold her aspirin 7 days prior to surgery and rather than aspirin we will likely start her on Lovenox after surgery. She is not taking any anti-inflammatory medications and knows not to do so prior to surgery. She will also hold vitamins and supplements one week prior to surgery.  We have also discussed the post-operative recovery period to include: bathing/showering restrictions, wound healing, activity (and driving) restrictions, medications/pain mangement.  We have also discussed post-operative redflags to include: signs and symptoms of postoperative infection, DVT/PE.  Patient will be fitted for her LSO brace by physical therapy today.  All patients questions were invited and answered.

## 2020-04-23 ENCOUNTER — Ambulatory Visit (HOSPITAL_COMMUNITY)
Admission: RE | Admit: 2020-04-23 | Discharge: 2020-04-23 | Disposition: A | Discharge status: Home / Self Care | Payer: Medicare PPO | Source: Ambulatory Visit | Attending: Orthopedic Surgery | Admitting: Orthopedic Surgery

## 2020-04-23 ENCOUNTER — Encounter (HOSPITAL_COMMUNITY): Payer: Self-pay

## 2020-04-23 ENCOUNTER — Other Ambulatory Visit: Payer: Self-pay

## 2020-04-23 ENCOUNTER — Encounter (HOSPITAL_COMMUNITY)
Admission: RE | Admit: 2020-04-23 | Discharge: 2020-04-23 | Disposition: A | Discharge status: Home / Self Care | Payer: Medicare PPO | Source: Ambulatory Visit | Attending: Orthopedic Surgery | Admitting: Orthopedic Surgery

## 2020-04-23 DIAGNOSIS — I2510 Atherosclerotic heart disease of native coronary artery without angina pectoris: Secondary | ICD-10-CM | POA: Insufficient documentation

## 2020-04-23 DIAGNOSIS — Z01818 Encounter for other preprocedural examination: Secondary | ICD-10-CM | POA: Insufficient documentation

## 2020-04-23 DIAGNOSIS — Z8249 Family history of ischemic heart disease and other diseases of the circulatory system: Secondary | ICD-10-CM | POA: Insufficient documentation

## 2020-04-23 LAB — BASIC METABOLIC PANEL
Anion gap: 10 (ref 5–15)
BUN: 25 mg/dL — ABNORMAL HIGH (ref 8–23)
CO2: 22 mmol/L (ref 22–32)
Calcium: 10 mg/dL (ref 8.9–10.3)
Chloride: 106 mmol/L (ref 98–111)
Creatinine, Ser: 0.97 mg/dL (ref 0.44–1.00)
GFR calc Af Amer: >60 mL/min (ref >60)
GFR calc non Af Amer: >60 mL/min (ref >60)
Glucose, Bld: 117 mg/dL — ABNORMAL HIGH (ref 70–99)
Potassium: 4.4 mmol/L (ref 3.5–5.1)
Sodium: 138 mmol/L (ref 135–145)

## 2020-04-23 LAB — URINALYSIS, ROUTINE W REFLEX MICROSCOPIC
Bilirubin Urine: NEGATIVE
Glucose, UA: NEGATIVE mg/dL
Ketones, ur: NEGATIVE mg/dL
Nitrite: NEGATIVE
Protein, ur: NEGATIVE mg/dL
Specific Gravity, Urine: 1.02 (ref 1.005–1.030)
pH: 6 (ref 5.0–8.0)

## 2020-04-23 LAB — TYPE AND SCREEN
ABO/RH(D): A POS
Antibody Screen: NEGATIVE

## 2020-04-23 LAB — SURGICAL PCR SCREEN
MRSA, PCR: NEGATIVE
Staphylococcus aureus: NEGATIVE

## 2020-04-23 LAB — CBC
HCT: 46.5 % — ABNORMAL HIGH (ref 36.0–46.0)
Hemoglobin: 15.2 g/dL — ABNORMAL HIGH (ref 12.0–15.0)
MCH: 31.8 pg (ref 26.0–34.0)
MCHC: 32.7 g/dL (ref 30.0–36.0)
MCV: 97.3 fL (ref 80.0–100.0)
Platelets: 238 10*3/uL (ref 150–400)
RBC: 4.78 MIL/uL (ref 3.87–5.11)
RDW: 13 % (ref 11.5–15.5)
WBC: 7.9 10*3/uL (ref 4.0–10.5)
nRBC: 0 % (ref 0.0–0.2)

## 2020-04-23 LAB — PROTIME-INR
INR: 1 (ref 0.8–1.2)
Prothrombin Time: 12.5 seconds (ref 11.4–15.2)

## 2020-04-23 LAB — APTT: aPTT: 29 seconds (ref 24–36)

## 2020-04-23 MED ORDER — CHLORHEXIDINE GLUCONATE 4 % EX LIQD: 60.0000 mL | Freq: Once | CUTANEOUS | Status: DC

## 2020-04-23 NOTE — Progress Notes (Signed)
PCP - CarolCox Cardiologist - DR VARANSI----CLEARANCE  d - Chest x-ray - 6/21 EKG - 1/21 Stress Test - NA ECHO - NA Cardiac Cath - NA   Aspirin Instructions:STOP   COVID TEST- FOR TUES   Anesthesia review: HTN  Patient denies shortness of breath, fever, cough and chest pain at PAT appointment  Carol Cox All instructions explained to the patient, with a verbal understanding of the material. Patient agrees to go over the instructions while at home for a better understanding. Patient also instructed to self quarantine after being tested for COVID-19. The opportunity to ask questions was provided.

## 2020-04-24 NOTE — Anesthesia Preprocedure Evaluation (Addendum)
Anesthesia Evaluation  Patient identified by MRN, date of birth, ID band Patient awake    Reviewed: Allergy & Precautions, NPO status , Patient's Chart, lab work & pertinent test results  History of Anesthesia Complications (+) PONV  Airway Mallampati: II  TM Distance: >3 FB Neck ROM: Full    Dental  (+) Dental Advisory Given   Pulmonary neg pulmonary ROS,    breath sounds clear to auscultation       Cardiovascular hypertension, Pt. on medications + CAD   Rhythm:Regular Rate:Normal     Neuro/Psych  Neuromuscular disease    GI/Hepatic negative GI ROS, Neg liver ROS,   Endo/Other  negative endocrine ROS  Renal/GU negative Renal ROS     Musculoskeletal  (+) Arthritis ,   Abdominal   Peds  Hematology negative hematology ROS (+)   Anesthesia Other Findings   Reproductive/Obstetrics                             Lab Results  Component Value Date   WBC 7.9 04/23/2020   HGB 15.2 (H) 04/23/2020   HCT 46.5 (H) 04/23/2020   MCV 97.3 04/23/2020   PLT 238 04/23/2020   Lab Results  Component Value Date   CREATININE 0.97 04/23/2020   BUN 25 (H) 04/23/2020   NA 138 04/23/2020   K 4.4 04/23/2020   CL 106 04/23/2020   CO2 22 04/23/2020    Anesthesia Physical Anesthesia Plan  ASA: II  Anesthesia Plan: General   Post-op Pain Management:  Regional for Post-op pain   Induction: Intravenous  PONV Risk Score and Plan: 4 or greater and Midazolam, Propofol infusion, Dexamethasone, Ondansetron and Treatment may vary due to age or medical condition  Airway Management Planned: Oral ETT  Additional Equipment: Arterial line  Intra-op Plan:   Post-operative Plan: Extubation in OR  Informed Consent: I have reviewed the patients History and Physical, chart, labs and discussed the procedure including the risks, benefits and alternatives for the proposed anesthesia with the patient or authorized  representative who has indicated his/her understanding and acceptance.     Dental advisory given  Plan Discussed with: CRNA  Anesthesia Plan Comments: (PAT note by Karoline Caldwell, PA-C: Follows with cardiology for strong family hx of CAD and risk factor modification.  She had chest discomfort in 2014. This resulted in a heart catheterization which showed mild, nonobstructive coronary artery disease, chest pain felt to be noncardiac.  Cardiac clearance per telephone encounter 12/14/19, "Chart reviewed as part of pre-operative protocol coverage. Patient was contacted 12/14/2019 in reference to pre-operative risk assessment for pending surgery as outlined below.  Carol Cox was last seen on 11/06/19 by Dr. Irish Lack.  Since that day, Carol Cox has done well.  She is limited by limb pain related to her need for back surgery, but can still complete more than 4.0 METS without angina. Recommend continuing ASA throughout the perioperative period. However, if deemed absolutely necessary, may hold 5-7 days. Therefore, based on ACC/AHA guidelines, the patient would be at acceptable risk for the planned procedure without further cardiovascular testing. "  History of severe PONV.   Preop labs reviewed, unremarkable.  EKG 11/06/19: NSR. Rate 69.  CHEST - 2 VIEW 04/23/20:  COMPARISON:  December 17, 2019  FINDINGS: Lungs are clear. Heart size and pulmonary vascularity are normal. No adenopathy. There is acromioclavicular separation on the right, stable.  IMPRESSION: No edema or airspace opacity. Cardiac silhouette  normal. 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  Cath 02/06/2013: Coronary angiography: Coronary dominance: right  Left mainstem: Short vessel, no significant CAD.   Left anterior descending (LAD): 25% mid LAD stenosis.   Left circumflex (LCx): No significant CAD.   Right coronary artery (RCA): No significant CAD.  Left ventriculography: Left ventricular  systolic function is normal, LVEF is estimated at 65-70%, there is no significant mitral regurgitation   Final Conclusions:  Mild nonobstructive CAD.  Suspect noncardiac chest pain.  May go home today.  Continue home statin and continue ASA 81 mg daily. )       Anesthesia Quick Evaluation

## 2020-04-24 NOTE — Progress Notes (Signed)
Anesthesia Chart Review:  Follows with cardiology for strong family hx of CAD and risk factor modification.  She had chest discomfort in 2014. This resulted in a heart catheterization which showed mild, nonobstructive coronary artery disease, chest pain felt to be noncardiac.  Cardiac clearance per telephone encounter 12/14/19, "Chart reviewed as part of pre-operative protocol coverage. Patient was contacted 12/14/2019 in reference to pre-operative risk assessment for pending surgery as outlined below.  Carol Cox was last seen on 11/06/19 by Dr. Irish Lack.  Since that day, Carol Cox has done well.  She is limited by limb pain related to her need for back surgery, but can still complete more than 4.0 METS without angina. Recommend continuing ASA throughout the perioperative period. However, if deemed absolutely necessary, may hold 5-7 days. Therefore, based on ACC/AHA guidelines, the patient would be at acceptable risk for the planned procedure without further cardiovascular testing. "  History of severe PONV.   Preop labs reviewed, unremarkable.  EKG 11/06/19: NSR. Rate 69.  CHEST - 2 VIEW 04/23/20:  COMPARISON:  December 17, 2019  FINDINGS: Lungs are clear. Heart size and pulmonary vascularity are normal. No adenopathy. There is acromioclavicular separation on the right, stable.  IMPRESSION: No edema or airspace opacity. Cardiac silhouette normal. 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  Cath 02/06/2013: Coronary angiography: Coronary dominance: right  Left mainstem: Short vessel, no significant CAD.   Left anterior descending (LAD): 25% mid LAD stenosis.   Left circumflex (LCx): No significant CAD.   Right coronary artery (RCA): No significant CAD.  Left ventriculography: Left ventricular systolic function is normal, LVEF is estimated at 65-70%, there is no significant mitral regurgitation   Final Conclusions:  Mild nonobstructive CAD.  Suspect  noncardiac chest pain.  May go home today.  Continue home statin and continue ASA 81 mg daily.    Wynonia Musty [facility] Short Stay Center/Anesthesiology Phone [phone] 04/24/2020 4:12 PM

## 2020-04-30 ENCOUNTER — Other Ambulatory Visit (HOSPITAL_COMMUNITY)
Admission: RE | Admit: 2020-04-30 | Discharge: 2020-04-30 | Disposition: A | Discharge status: Home / Self Care | Payer: Medicare PPO | Source: Ambulatory Visit | Attending: Orthopedic Surgery | Admitting: Orthopedic Surgery

## 2020-04-30 LAB — SARS CORONAVIRUS 2 (TAT 6-24 HRS): SARS Coronavirus 2: NEGATIVE

## 2020-05-01 ENCOUNTER — Inpatient Hospital Stay (HOSPITAL_COMMUNITY)
Admission: RE | Admit: 2020-05-01 | Discharge: 2020-05-04 | DRG: 455 | Disposition: A | Discharge status: Home / Self Care | Payer: Medicare PPO | Attending: Orthopedic Surgery | Admitting: Orthopedic Surgery

## 2020-05-01 ENCOUNTER — Inpatient Hospital Stay (HOSPITAL_COMMUNITY): Payer: Medicare PPO | Admitting: Anesthesiology

## 2020-05-01 ENCOUNTER — Inpatient Hospital Stay (HOSPITAL_COMMUNITY): Payer: Medicare PPO | Admitting: Physician Assistant

## 2020-05-01 ENCOUNTER — Other Ambulatory Visit: Payer: Self-pay

## 2020-05-01 ENCOUNTER — Inpatient Hospital Stay (HOSPITAL_COMMUNITY): Payer: Medicare PPO

## 2020-05-01 ENCOUNTER — Encounter (HOSPITAL_COMMUNITY): Payer: Self-pay | Admitting: Orthopedic Surgery

## 2020-05-01 ENCOUNTER — Inpatient Hospital Stay (HOSPITAL_COMMUNITY)
Admission: RE | Disposition: A | Discharge status: Home / Self Care | Payer: Self-pay | Source: Home / Self Care | Attending: Orthopedic Surgery

## 2020-05-01 DIAGNOSIS — I2510 Atherosclerotic heart disease of native coronary artery without angina pectoris: Secondary | ICD-10-CM | POA: Diagnosis present

## 2020-05-01 DIAGNOSIS — H409 Unspecified glaucoma: Secondary | ICD-10-CM | POA: Diagnosis present

## 2020-05-01 DIAGNOSIS — Z6831 Body mass index (BMI) 31.0-31.9, adult: Secondary | ICD-10-CM | POA: Diagnosis not present

## 2020-05-01 DIAGNOSIS — M5416 Radiculopathy, lumbar region: Secondary | ICD-10-CM | POA: Diagnosis not present

## 2020-05-01 DIAGNOSIS — Z888 Allergy status to other drugs, medicaments and biological substances status: Secondary | ICD-10-CM

## 2020-05-01 DIAGNOSIS — M810 Age-related osteoporosis without current pathological fracture: Secondary | ICD-10-CM | POA: Diagnosis present

## 2020-05-01 DIAGNOSIS — Z20822 Contact with and (suspected) exposure to covid-19: Secondary | ICD-10-CM | POA: Diagnosis present

## 2020-05-01 DIAGNOSIS — G629 Polyneuropathy, unspecified: Secondary | ICD-10-CM | POA: Diagnosis present

## 2020-05-01 DIAGNOSIS — M2578 Osteophyte, vertebrae: Secondary | ICD-10-CM | POA: Diagnosis present

## 2020-05-01 DIAGNOSIS — M4807 Spinal stenosis, lumbosacral region: Secondary | ICD-10-CM | POA: Diagnosis present

## 2020-05-01 DIAGNOSIS — M5116 Intervertebral disc disorders with radiculopathy, lumbar region: Secondary | ICD-10-CM | POA: Diagnosis present

## 2020-05-01 DIAGNOSIS — Z981 Arthrodesis status: Secondary | ICD-10-CM

## 2020-05-01 DIAGNOSIS — I10 Essential (primary) hypertension: Secondary | ICD-10-CM | POA: Diagnosis present

## 2020-05-01 DIAGNOSIS — Z419 Encounter for procedure for purposes other than remedying health state, unspecified: Secondary | ICD-10-CM

## 2020-05-01 DIAGNOSIS — M5117 Intervertebral disc disorders with radiculopathy, lumbosacral region: Secondary | ICD-10-CM | POA: Diagnosis present

## 2020-05-01 DIAGNOSIS — M48062 Spinal stenosis, lumbar region with neurogenic claudication: Principal | ICD-10-CM | POA: Diagnosis present

## 2020-05-01 DIAGNOSIS — M5417 Radiculopathy, lumbosacral region: Secondary | ICD-10-CM | POA: Diagnosis not present

## 2020-05-01 DIAGNOSIS — E669 Obesity, unspecified: Secondary | ICD-10-CM | POA: Diagnosis present

## 2020-05-01 DIAGNOSIS — M1990 Unspecified osteoarthritis, unspecified site: Secondary | ICD-10-CM | POA: Diagnosis present

## 2020-05-01 DIAGNOSIS — G8929 Other chronic pain: Secondary | ICD-10-CM | POA: Diagnosis present

## 2020-05-01 HISTORY — PX: ANTERIOR LAT LUMBAR FUSION: SHX1168

## 2020-05-01 HISTORY — PX: ABDOMINAL EXPOSURE: SHX5708

## 2020-05-01 SURGERY — ANTERIOR LATERAL LUMBAR FUSION 2 LEVELS
Anesthesia: General

## 2020-05-01 MED ORDER — AMISULPRIDE (ANTIEMETIC) 5 MG/2ML IV SOLN
10.0000 mg | Freq: Once | INTRAVENOUS | Status: AC
  Administered 2020-05-01: 10 mg via INTRAVENOUS

## 2020-05-01 MED ORDER — ACETAMINOPHEN 325 MG PO TABS
650.0000 mg | ORAL_TABLET | ORAL | Status: DC | PRN
  Administered 2020-05-02: 650 mg via ORAL
  Filled 2020-05-01 (×2): qty 2

## 2020-05-01 MED ORDER — ACETAMINOPHEN 500 MG PO TABS
1000.0000 mg | ORAL_TABLET | Freq: Once | ORAL | Status: AC
  Administered 2020-05-01: 1000 mg via ORAL
  Filled 2020-05-01: qty 2

## 2020-05-01 MED ORDER — DEXAMETHASONE SODIUM PHOSPHATE 10 MG/ML IJ SOLN
INTRAMUSCULAR | Status: AC
  Filled 2020-05-01: qty 1

## 2020-05-01 MED ORDER — SUGAMMADEX SODIUM 200 MG/2ML IV SOLN
INTRAVENOUS | Status: DC | PRN
  Administered 2020-05-01: 190 mg via INTRAVENOUS

## 2020-05-01 MED ORDER — SODIUM CHLORIDE 0.9% FLUSH: 3.0000 mL | INTRAVENOUS | Status: DC | PRN

## 2020-05-01 MED ORDER — CEFAZOLIN SODIUM-DEXTROSE 2-4 GM/100ML-% IV SOLN
2.0000 g | INTRAVENOUS | Status: AC
  Administered 2020-05-01 (×2): 2 g via INTRAVENOUS
  Filled 2020-05-01: qty 100

## 2020-05-01 MED ORDER — TRANEXAMIC ACID 1000 MG/10ML IV SOLN
INTRAVENOUS | Status: DC | PRN
  Administered 2020-05-01: 1000 mg via INTRAVENOUS

## 2020-05-01 MED ORDER — BUPIVACAINE LIPOSOME 1.3 % IJ SUSP
INTRAMUSCULAR | Status: DC | PRN
  Administered 2020-05-01: 10 mL

## 2020-05-01 MED ORDER — ALBUMIN HUMAN 5 % IV SOLN
INTRAVENOUS | Status: DC | PRN
Start: 2020-05-01 — End: 2020-05-01

## 2020-05-01 MED ORDER — MENTHOL 3 MG MT LOZG: 1.0000 | LOZENGE | OROMUCOSAL | Status: DC | PRN

## 2020-05-01 MED ORDER — DORZOLAMIDE HCL-TIMOLOL MAL 2-0.5 % OP SOLN
1.0000 [drp] | Freq: Two times a day (BID) | OPHTHALMIC | Status: DC
  Filled 2020-05-01: qty 10

## 2020-05-01 MED ORDER — GABAPENTIN 300 MG PO CAPS
300.0000 mg | ORAL_CAPSULE | Freq: Two times a day (BID) | ORAL | Status: DC
  Filled 2020-05-01: qty 1

## 2020-05-01 MED ORDER — FENTANYL CITRATE (PF) 250 MCG/5ML IJ SOLN
INTRAMUSCULAR | Status: AC
  Filled 2020-05-01: qty 5

## 2020-05-01 MED ORDER — PROPOFOL 10 MG/ML IV BOLUS
INTRAVENOUS | Status: DC | PRN
  Administered 2020-05-01: 150 mg via INTRAVENOUS

## 2020-05-01 MED ORDER — CEFAZOLIN SODIUM-DEXTROSE 1-4 GM/50ML-% IV SOLN
1.0000 g | Freq: Three times a day (TID) | INTRAVENOUS | Status: AC
  Administered 2020-05-01 – 2020-05-02 (×2): 1 g via INTRAVENOUS
  Filled 2020-05-01 (×2): qty 50

## 2020-05-01 MED ORDER — LACTATED RINGERS IV SOLN: INTRAVENOUS | Status: DC | PRN

## 2020-05-01 MED ORDER — FENTANYL CITRATE (PF) 250 MCG/5ML IJ SOLN
INTRAMUSCULAR | Status: DC | PRN
  Administered 2020-05-01 (×3): 50 ug via INTRAVENOUS
  Administered 2020-05-01: 100 ug via INTRAVENOUS
  Administered 2020-05-01 (×3): 50 ug via INTRAVENOUS

## 2020-05-01 MED ORDER — OXYCODONE HCL 5 MG PO TABS: 5.0000 mg | ORAL_TABLET | ORAL | Status: DC | PRN

## 2020-05-01 MED ORDER — CHLORHEXIDINE GLUCONATE 0.12 % MT SOLN
OROMUCOSAL | Status: AC
  Administered 2020-05-01: 15 mL
  Filled 2020-05-01: qty 15

## 2020-05-01 MED ORDER — PROPOFOL 10 MG/ML IV BOLUS
INTRAVENOUS | Status: AC
  Filled 2020-05-01: qty 20

## 2020-05-01 MED ORDER — PHENYLEPHRINE HCL-NACL 10-0.9 MG/250ML-% IV SOLN
INTRAVENOUS | Status: DC | PRN
Start: 2020-05-01 — End: 2020-05-01
  Administered 2020-05-01 (×2): 50 ug/min via INTRAVENOUS

## 2020-05-01 MED ORDER — 0.9 % SODIUM CHLORIDE (POUR BTL) OPTIME
TOPICAL | Status: DC | PRN
  Administered 2020-05-01 (×3): 1000 mL

## 2020-05-01 MED ORDER — LISINOPRIL 20 MG PO TABS
20.0000 mg | ORAL_TABLET | Freq: Every day | ORAL | Status: DC
  Administered 2020-05-01 – 2020-05-03 (×2): 20 mg via ORAL
  Filled 2020-05-01 (×2): qty 1

## 2020-05-01 MED ORDER — ONDANSETRON HCL 4 MG/2ML IJ SOLN
INTRAMUSCULAR | Status: DC | PRN
  Administered 2020-05-01: 4 mg via INTRAVENOUS

## 2020-05-01 MED ORDER — POLYETHYLENE GLYCOL 3350 17 G PO PACK: 17.0000 g | PACK | Freq: Every day | ORAL | Status: DC | PRN

## 2020-05-01 MED ORDER — BUPIVACAINE-EPINEPHRINE 0.25% -1:200000 IJ SOLN: INTRAMUSCULAR | Status: DC | PRN

## 2020-05-01 MED ORDER — ACETAMINOPHEN 650 MG RE SUPP: 650.0000 mg | RECTAL | Status: DC | PRN

## 2020-05-01 MED ORDER — PHENOL 1.4 % MT LIQD: 1.0000 | OROMUCOSAL | Status: DC | PRN

## 2020-05-01 MED ORDER — BUPIVACAINE HCL (PF) 0.25 % IJ SOLN
INTRAMUSCULAR | Status: DC | PRN
  Administered 2020-05-01: 20 mL

## 2020-05-01 MED ORDER — METHOCARBAMOL 1000 MG/10ML IJ SOLN
500.0000 mg | Freq: Four times a day (QID) | INTRAVENOUS | Status: DC | PRN
  Filled 2020-05-01: qty 5

## 2020-05-01 MED ORDER — FLEET ENEMA 7-19 GM/118ML RE ENEM: 1.0000 | ENEMA | Freq: Once | RECTAL | Status: DC | PRN

## 2020-05-01 MED ORDER — MIDAZOLAM HCL 2 MG/2ML IJ SOLN
INTRAMUSCULAR | Status: AC
  Filled 2020-05-01: qty 2

## 2020-05-01 MED ORDER — SODIUM CHLORIDE 0.9 % IV SOLN: 250.0000 mL | INTRAVENOUS | Status: DC

## 2020-05-01 MED ORDER — THROMBIN (RECOMBINANT) 20000 UNITS EX SOLR
CUTANEOUS | Status: AC
  Filled 2020-05-01: qty 20000

## 2020-05-01 MED ORDER — LACTATED RINGERS IV SOLN: INTRAVENOUS | Status: DC

## 2020-05-01 MED ORDER — EPINEPHRINE PF 1 MG/ML IJ SOLN
INTRAMUSCULAR | Status: AC
  Filled 2020-05-01: qty 1

## 2020-05-01 MED ORDER — ONDANSETRON HCL 4 MG PO TABS: 4.0000 mg | ORAL_TABLET | Freq: Four times a day (QID) | ORAL | Status: DC | PRN

## 2020-05-01 MED ORDER — ROCURONIUM BROMIDE 10 MG/ML (PF) SYRINGE
PREFILLED_SYRINGE | INTRAVENOUS | Status: AC
  Filled 2020-05-01: qty 10

## 2020-05-01 MED ORDER — MORPHINE SULFATE (PF) 2 MG/ML IV SOLN
2.0000 mg | INTRAVENOUS | Status: DC | PRN
  Administered 2020-05-01: 2 mg via INTRAVENOUS
  Filled 2020-05-01: qty 1

## 2020-05-01 MED ORDER — HEMOSTATIC AGENTS (NO CHARGE) OPTIME
TOPICAL | Status: DC | PRN
  Administered 2020-05-01 (×3): 1 via TOPICAL

## 2020-05-01 MED ORDER — THROMBIN 20000 UNITS EX SOLR
CUTANEOUS | Status: DC | PRN
  Administered 2020-05-01: 20 mL via TOPICAL

## 2020-05-01 MED ORDER — ONDANSETRON HCL 4 MG/2ML IJ SOLN
INTRAMUSCULAR | Status: AC
  Filled 2020-05-01: qty 2

## 2020-05-01 MED ORDER — TRANEXAMIC ACID-NACL 1000-0.7 MG/100ML-% IV SOLN
INTRAVENOUS | Status: AC
  Filled 2020-05-01: qty 100

## 2020-05-01 MED ORDER — ROCURONIUM BROMIDE 10 MG/ML (PF) SYRINGE
PREFILLED_SYRINGE | INTRAVENOUS | Status: DC | PRN
  Administered 2020-05-01 (×4): 50 mg via INTRAVENOUS

## 2020-05-01 MED ORDER — MIDAZOLAM HCL 2 MG/2ML IJ SOLN
INTRAMUSCULAR | Status: DC | PRN
  Administered 2020-05-01: 2 mg via INTRAVENOUS

## 2020-05-01 MED ORDER — ONDANSETRON HCL 4 MG/2ML IJ SOLN
4.0000 mg | Freq: Four times a day (QID) | INTRAMUSCULAR | Status: DC | PRN
  Administered 2020-05-02: 4 mg via INTRAVENOUS
  Filled 2020-05-01: qty 2

## 2020-05-01 MED ORDER — SODIUM CHLORIDE 0.9% FLUSH
3.0000 mL | Freq: Two times a day (BID) | INTRAVENOUS | Status: DC
  Administered 2020-05-01: 3 mL via INTRAVENOUS

## 2020-05-01 MED ORDER — LIDOCAINE 2% (20 MG/ML) 5 ML SYRINGE
INTRAMUSCULAR | Status: AC
  Filled 2020-05-01: qty 5

## 2020-05-01 MED ORDER — OXYCODONE HCL 5 MG PO TABS
10.0000 mg | ORAL_TABLET | ORAL | Status: DC | PRN
  Administered 2020-05-01 – 2020-05-02 (×3): 10 mg via ORAL
  Filled 2020-05-01 (×3): qty 2

## 2020-05-01 MED ORDER — BUPIVACAINE HCL (PF) 0.25 % IJ SOLN
INTRAMUSCULAR | Status: AC
  Filled 2020-05-01: qty 30

## 2020-05-01 MED ORDER — EPINEPHRINE PF 1 MG/ML IJ SOLN: INTRAMUSCULAR | Status: DC | PRN

## 2020-05-01 MED ORDER — LIDOCAINE 2% (20 MG/ML) 5 ML SYRINGE
INTRAMUSCULAR | Status: DC | PRN
  Administered 2020-05-01: 20 mg via INTRAVENOUS

## 2020-05-01 MED ORDER — METHOCARBAMOL 500 MG PO TABS
500.0000 mg | ORAL_TABLET | Freq: Four times a day (QID) | ORAL | Status: DC | PRN
  Administered 2020-05-01 (×2): 500 mg via ORAL
  Filled 2020-05-01 (×2): qty 1

## 2020-05-01 MED ORDER — PROPOFOL 500 MG/50ML IV EMUL
INTRAVENOUS | Status: DC | PRN
Start: 2020-05-01 — End: 2020-05-01
  Administered 2020-05-01: 25 ug/kg/min via INTRAVENOUS

## 2020-05-01 MED ORDER — EPHEDRINE SULFATE-NACL 50-0.9 MG/10ML-% IV SOSY
PREFILLED_SYRINGE | INTRAVENOUS | Status: DC | PRN
  Administered 2020-05-01: 15 mg via INTRAVENOUS
  Administered 2020-05-01 (×2): 5 mg via INTRAVENOUS
  Administered 2020-05-01: 10 mg via INTRAVENOUS

## 2020-05-01 MED ORDER — PHENYLEPHRINE 40 MCG/ML (10ML) SYRINGE FOR IV PUSH (FOR BLOOD PRESSURE SUPPORT)
PREFILLED_SYRINGE | INTRAVENOUS | Status: DC | PRN
  Administered 2020-05-01: 200 ug via INTRAVENOUS
  Administered 2020-05-01: 80 ug via INTRAVENOUS
  Administered 2020-05-01: 120 ug via INTRAVENOUS

## 2020-05-01 MED ORDER — DEXAMETHASONE SODIUM PHOSPHATE 10 MG/ML IJ SOLN
INTRAMUSCULAR | Status: DC | PRN
  Administered 2020-05-01: 10 mg via INTRAVENOUS

## 2020-05-01 SURGICAL SUPPLY — 93 items
ADH SKN CLS APL DERMABOND .7 (GAUZE/BANDAGES/DRESSINGS) ×1
AGENT HMST KT MTR STRL THRMB (HEMOSTASIS) ×3
ALIF IMPLANT 40X27X14-12 (Cage) ×3 IMPLANT
APPLIER CLIP 11 MED OPEN (CLIP) ×3
APR CLP MED 11 20 MLT OPN (CLIP) ×1
BLADE CLIPPER SURG (BLADE) IMPLANT
BLADE SURG 10 STRL SS (BLADE) ×3 IMPLANT
BONE VIVIGEN FORMABLE 10CC (Bone Implant) ×3 IMPLANT
BONE VIVIGEN FORMABLE 5.4CC (Bone Implant) ×3 IMPLANT
CAGE INTERBODY NL 14 XL 7D (Cage) ×2 IMPLANT
CATH FOLEY 2WAY SLVR 30CC 16FR (CATHETERS) ×3 IMPLANT
CLIP APPLIE 11 MED OPEN (CLIP) ×1 IMPLANT
CLIP LIGATING EXTRA MED SLVR (CLIP) ×3 IMPLANT
CLIP LIGATING EXTRA SM BLUE (MISCELLANEOUS) ×3 IMPLANT
CLOSURE STERI-STRIP 1/2X4 (GAUZE/BANDAGES/DRESSINGS) ×1
CLSR STERI-STRIP ANTIMIC 1/2X4 (GAUZE/BANDAGES/DRESSINGS) ×2 IMPLANT
COVER SURGICAL LIGHT HANDLE (MISCELLANEOUS) ×3 IMPLANT
COVER WAND RF STERILE (DRAPES) ×6 IMPLANT
DERMABOND ADVANCED (GAUZE/BANDAGES/DRESSINGS) ×2
DERMABOND ADVANCED .7 DNX12 (GAUZE/BANDAGES/DRESSINGS) ×1 IMPLANT
DRAPE C-ARM 42X72 X-RAY (DRAPES) ×6 IMPLANT
DRAPE C-ARMOR (DRAPES) ×3 IMPLANT
DRAPE INCISE IOBAN 66X45 STRL (DRAPES) IMPLANT
DRAPE U-SHAPE 47X51 STRL (DRAPES) ×6 IMPLANT
DRSG OPSITE POSTOP 4X6 (GAUZE/BANDAGES/DRESSINGS) ×3 IMPLANT
DRSG OPSITE POSTOP 4X8 (GAUZE/BANDAGES/DRESSINGS) ×2 IMPLANT
DURAPREP 26ML APPLICATOR (WOUND CARE) ×3 IMPLANT
ELECT BLADE 4.0 EZ CLEAN MEGAD (MISCELLANEOUS) ×6
ELECT PENCIL ROCKER SW 15FT (MISCELLANEOUS) ×3 IMPLANT
ELECT REM PT RETURN 9FT ADLT (ELECTROSURGICAL) ×3
ELECTRODE BLDE 4.0 EZ CLN MEGD (MISCELLANEOUS) ×2 IMPLANT
ELECTRODE REM PT RTRN 9FT ADLT (ELECTROSURGICAL) ×1 IMPLANT
GLOVE BIO SURGEON STRL SZ 6.5 (GLOVE) ×2 IMPLANT
GLOVE BIO SURGEONS STRL SZ 6.5 (GLOVE) ×1
GLOVE BIOGEL PI IND STRL 6.5 (GLOVE) ×1 IMPLANT
GLOVE BIOGEL PI IND STRL 8.5 (GLOVE) ×1 IMPLANT
GLOVE BIOGEL PI INDICATOR 6.5 (GLOVE) ×2
GLOVE BIOGEL PI INDICATOR 8.5 (GLOVE) ×2
GLOVE SS BIOGEL STRL SZ 7.5 (GLOVE) ×1 IMPLANT
GLOVE SS BIOGEL STRL SZ 8.5 (GLOVE) ×1 IMPLANT
GLOVE SUPERSENSE BIOGEL SZ 7.5 (GLOVE) ×2
GLOVE SUPERSENSE BIOGEL SZ 8.5 (GLOVE) ×2
GOWN STRL REUS W/ TWL LRG LVL3 (GOWN DISPOSABLE) ×2 IMPLANT
GOWN STRL REUS W/ TWL XL LVL3 (GOWN DISPOSABLE) IMPLANT
GOWN STRL REUS W/TWL 2XL LVL3 (GOWN DISPOSABLE) ×6 IMPLANT
GOWN STRL REUS W/TWL LRG LVL3 (GOWN DISPOSABLE) ×6
GOWN STRL REUS W/TWL XL LVL3 (GOWN DISPOSABLE)
GRAFT BNE MATRIX VG FRMBL L 10 (Bone Implant) IMPLANT
GRAFT BNE MATRIX VG FRMBL MD 5 (Bone Implant) IMPLANT
IMPL ALIF 40X27X14-12 (Cage) IMPLANT
INSERT FOGARTY 61MM (MISCELLANEOUS) IMPLANT
INSERT FOGARTY SM (MISCELLANEOUS) IMPLANT
KIT BASIN OR (CUSTOM PROCEDURE TRAY) ×3 IMPLANT
KIT TURNOVER KIT B (KITS) ×3 IMPLANT
LOOP VESSEL MAXI BLUE (MISCELLANEOUS) IMPLANT
LOOP VESSEL MINI RED (MISCELLANEOUS) IMPLANT
NDL SPNL 18GX3.5 QUINCKE PK (NEEDLE) ×1 IMPLANT
NEEDLE 22X1 1/2 (OR ONLY) (NEEDLE) ×3 IMPLANT
NEEDLE SPNL 18GX3.5 QUINCKE PK (NEEDLE) ×3 IMPLANT
NS IRRIG 1000ML POUR BTL (IV SOLUTION) ×3 IMPLANT
PACK LAMINECTOMY ORTHO (CUSTOM PROCEDURE TRAY) ×3 IMPLANT
PACK UNIVERSAL I (CUSTOM PROCEDURE TRAY) ×3 IMPLANT
PAD ARMBOARD 7.5X6 YLW CONV (MISCELLANEOUS) ×12 IMPLANT
SCREW 6.5X25 (Screw) ×4 IMPLANT
SPONGE INTESTINAL PEANUT (DISPOSABLE) ×3 IMPLANT
SPONGE LAP 18X18 RF (DISPOSABLE) IMPLANT
SPONGE LAP 4X18 RFD (DISPOSABLE) ×5 IMPLANT
SPONGE SURGIFOAM ABS GEL 100 (HEMOSTASIS) ×2 IMPLANT
STAPLER VISISTAT 35W (STAPLE) ×3 IMPLANT
SURGIFLO W/THROMBIN 8M KIT (HEMOSTASIS) ×6 IMPLANT
SUT BONE WAX W31G (SUTURE) IMPLANT
SUT MNCRL AB 3-0 PS2 27 (SUTURE) ×3 IMPLANT
SUT PROLENE 4 0 RB 1 (SUTURE)
SUT PROLENE 4-0 RB1 .5 CRCL 36 (SUTURE) IMPLANT
SUT PROLENE 5 0 CC1 (SUTURE) IMPLANT
SUT PROLENE 6 0 C 1 30 (SUTURE) ×3 IMPLANT
SUT PROLENE 6 0 CC (SUTURE) IMPLANT
SUT SILK 0 TIES 10X30 (SUTURE) ×3 IMPLANT
SUT SILK 2 0 TIES 10X30 (SUTURE) ×3 IMPLANT
SUT SILK 2 0SH CR/8 30 (SUTURE) IMPLANT
SUT SILK 3 0 TIES 10X30 (SUTURE) ×3 IMPLANT
SUT SILK 3 0SH CR/8 30 (SUTURE) IMPLANT
SUT VIC AB 0 CT1 27 (SUTURE) ×3
SUT VIC AB 0 CT1 27XBRD ANBCTR (SUTURE) IMPLANT
SUT VIC AB 1 CT1 18XCR BRD 8 (SUTURE) ×2 IMPLANT
SUT VIC AB 1 CT1 8-18 (SUTURE) ×6
SUT VIC AB 2-0 CT1 18 (SUTURE) ×5 IMPLANT
SYR BULB IRRIG 60ML STRL (SYRINGE) ×3 IMPLANT
TAPE CLOTH 4X10 WHT NS (GAUZE/BANDAGES/DRESSINGS) ×3 IMPLANT
TOWEL GREEN STERILE (TOWEL DISPOSABLE) ×6 IMPLANT
TOWEL GREEN STERILE FF (TOWEL DISPOSABLE) ×3 IMPLANT
WATER STERILE IRR 1000ML POUR (IV SOLUTION) IMPLANT
YANKAUER SUCT BULB TIP NO VENT (SUCTIONS) ×2 IMPLANT

## 2020-05-01 NOTE — OR Nursing (Signed)
Per protocol Dr.Green called at 12:33 on May 01, 2020 and confirmed that there were no retained foreign bodies present.

## 2020-05-01 NOTE — Anesthesia Preprocedure Evaluation (Addendum)
Anesthesia Evaluation  Patient identified by MRN, date of birth, ID band Patient awake    Reviewed: Allergy & Precautions, NPO status , Patient's Chart, lab work & pertinent test results  History of Anesthesia Complications (+) PONV and history of anesthetic complications  Airway Mallampati: III  TM Distance: >3 FB Neck ROM: Full    Dental  (+) Dental Advisory Given, Teeth Intact   Pulmonary neg pulmonary ROS,    Pulmonary exam normal        Cardiovascular hypertension, Pt. on medications CAD: denies, negative cath ~10 yrs ago.  Normal cardiovascular exam     Neuro/Psych negative neurological ROS  negative psych ROS   GI/Hepatic negative GI ROS, Neg liver ROS,   Endo/Other   Obesity   Renal/GU negative Renal ROS     Musculoskeletal  (+) Arthritis ,   Abdominal   Peds  Hematology negative hematology ROS (+)   Anesthesia Other Findings Covid test negative   Reproductive/Obstetrics                            Anesthesia Physical Anesthesia Plan  ASA: II  Anesthesia Plan: General   Post-op Pain Management:    Induction: Intravenous  PONV Risk Score and Plan: 4 or greater and Treatment may vary due to age or medical condition, Ondansetron and Dexamethasone  Airway Management Planned: Oral ETT  Additional Equipment: None  Intra-op Plan:   Post-operative Plan: Extubation in OR  Informed Consent: I have reviewed the patients History and Physical, chart, labs and discussed the procedure including the risks, benefits and alternatives for the proposed anesthesia with the patient or authorized representative who has indicated his/her understanding and acceptance.     Dental advisory given  Plan Discussed with: CRNA and Anesthesiologist  Anesthesia Plan Comments:        Anesthesia Quick Evaluation

## 2020-05-01 NOTE — H&P (Signed)
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 She has had debilitating back pain for some time now and despite appropriate conservative treatment her quality of life continues to be quite poor.  There is been no change in her clinical exam since her last office visit 04/22/2020.  I have gone over the surgical procedure with her in great detail. This will be a planned 2-day procedure. The first day will be an anterior lumbar interbody fusion L4-S1, and the second day will be a posterior supplemental pedicle screw fixation L4-S1 if needed open decompression.  All the risks benefits and alternatives as well as indications for surgery were reviewed with the patient and consent was obtained.

## 2020-05-01 NOTE — Op Note (Signed)
Operative report.  Preoperative diagnosis: Degenerative lumbar disc disease L4-S1 with foraminal stenosis and radicular leg pain  Postoperative diagnosis: Same  Operative procedure: Anterior lumbar interbody fusion L4-5 and L5-S1.  This was a planned 2-day procedure.  This was the first day/stage I.  Complications: None  First Assistant: Cleta Alberts, PA  Approach surgeon: Dr. Sherren Mocha Early  EBL: 250  Cell Saver: 0  Implants: Medtronic/Titan titanium intervertebral cage.  L4-5: 14 mm x 7 degree lordotic extra-large cage.  6.5 x 25 mm locking screw.  L5-S1: 14 mm x 12 degree lordotic extra-large cage.  6.5 x 25 mm locking screw.  Allograft: Vivogen  Indications:  Carol Cox is a very pleasant 69 year old woman who is been having severe debilitating back buttock and intermittent radicular leg pain.  Despite physical therapy, injection therapy, activity modification and medications her quality of life is continued to deteriorate.  As result of the failure of conservative management we elected to move forward with surgery.  All appropriate risks benefits and alternatives to surgery were discussed with the patient and consent was obtained.  Operative report: Patient was brought the operating room placed upon the operating room table.  After successful induction of general anesthesia and endotracheal intubation teds SCDs and a Foley were inserted.  The abdomen was then prepped and draped in a standard fashion timeout was taken to confirm patient procedure and all other important data.  Dr. Donnetta Hutching then performed a standard anterior retroperitoneal approach to the lumbar spine.  Once the retracting system was placed a needle was placed into the L5-S1 disc space.  X-ray confirmed that we are at the L5-S1 disc space.  Please refer to his dictation for specifics on the approach.  At this point Dr. Donnetta Hutching scrubbed out and I continued on with the fusion.  The annulotomy was performed with a 10 blade scalpel and  using pituitary rongeurs and curettes I removed all of the disc material.  I then used a fine angled curette to release the annulus from behind the vertebral body of S1 and L5.  I used a 2 mm Kerrison rongeur along with a 3 mm Kerrison rongeur to remove the posterior osteophytes from the bodies of L5 and S1 and the remaining portion of the posterior annulus.  At this point under live fluoroscopy I placed a lamina spreader in the disc space and confirmed I had parallel endplate distraction.  At this point I was very pleased with the decompression/discectomy.  I removed all of the cartilaginous endplate and had bleeding subchondral bone.  I then used the trial inserters and elected use the size 14 extra-large 12 degree lordotic cage.  This was packed with the allograft and malleted into position.  I had excellent restoration of the collapsed disc space height and indirect foraminal height restoration.  The implant was well seated and was not loose at all.  A single locking screw was placed through the cage and into the L5 vertebral body.  This was placed to help stabilize the implant to prevent anterior migration.  At this point Dr. Donnetta Hutching then returned to the OR to reposition the retractor blades to expose the L4-5 disc space.  Once he had completed this I marked the L4-5 disc space and took a second lateral intraoperative x-ray to confirm that I was at the appropriate level.  Using the same technique I used at L5-S1 and annulotomy was performed and I removed all of the disc material using a combination of pituitary rongeurs, curettes, and  Kerrison rongeurs.  I again used a fine curved curette to release the annulus from the posterior aspect of the L5 and L4 vertebral bodies.  This allowed excellent endplate parallel distraction.  I used my 2 and 3 mm Kerrison rongeur to trim down the osteophyte and remove all of the posterior annulus.  At this point again under live fluoroscopy confirmed that parallel endplate  distraction using a lamina spreader.  I then trialed the intervertebral spacers and elected to use the 14 mm extra-large 7 degree lordotic implant.  I packed it with the allograft and malleted to the appropriate depth.  I again had excellent fixation.  A second locking screw was placed through the cage and into the L4 vertebral body.  Both of the screws had excellent purchase.  At this point I irrigated the wound copiously with normal saline and confirmed hemostasis using bipolar electrocautery and FloSeal.  I sequentially remove the retractors confirming there was no bleeding.  With all the retractors were removed I took final intraoperative fluoroscopy views confirming satisfactory placement of the L4-5 and L5 intervertebral cages in both the AP and lateral planes.  At this point I closed the fascia of the rectus with a running #1 PDS suture.  Subsequent layers were closed in anatomical planes using a 0 Vicryl runner, 2-0 Vicryl suture, and a 3-0 Monocryl.  At this point an intraoperative AP digital x-ray was taken and read by the radiologist confirming there were no retained surgical instruments in the wound.  Steri-Strips and dry dressings were applied and the patient was ultimately extubated transfer the PACU without incident.  The end of the case needle and sponge counts were correct.  There were no adverse intraoperative events.

## 2020-05-01 NOTE — Transfer of Care (Signed)
Immediate Anesthesia Transfer of Care Note  Patient: Carol Cox  Procedure(s) Performed: ANTERIOR LATERAL LUMBAR FUSION L4-S1 (N/A ) ABDOMINAL EXPOSURE (N/A )  Patient Location: PACU  Anesthesia Type:General  Level of Consciousness: drowsy and patient cooperative  Airway & Oxygen Therapy: Patient Spontanous Breathing  Post-op Assessment: Report given to RN and Post -op Vital signs reviewed and stable  Post vital signs: Reviewed and stable  Last Vitals:  Vitals Value Taken Time  BP    Temp    Pulse    Resp    SpO2      Last Pain:  Vitals:   05/01/20 0706  TempSrc:   PainSc: 3       Patients Stated Pain Goal: 2 (14/43/60 1658)  Complications: No complications documented.

## 2020-05-01 NOTE — Brief Op Note (Signed)
05/01/2020  12:47 PM  PATIENT:  [name]  69 y.o. 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 Donnetta Hutching to do approach tap block with exparel ABDOMINAL EXPOSURE (N/A)  SURGEON:  Surgeon(s) and Role: Panel 1:    Melina Schools, MD - Primary Panel 2:    * Early, Arvilla Meres, MD - Primary  PHYSICIAN ASSISTANT:   ASSISTANTS: Amanda Ward, PA   ANESTHESIA:   general  EBL:  250 mL   BLOOD ADMINISTERED:none  DRAINS: none   LOCAL MEDICATIONS USED:  MARCAINE     SPECIMEN:  No Specimen  DISPOSITION OF SPECIMEN:  N/A  COUNTS:  YES  TOURNIQUET:  * No tourniquets in log *  DICTATION: .Dragon Dictation  PLAN OF CARE: Admit to inpatient   PATIENT DISPOSITION:  PACU - hemodynamically stable.

## 2020-05-01 NOTE — Op Note (Signed)
    OPERATIVE REPORT  DATE OF SURGERY: 05/01/2020  PATIENT: Carol Cox, 69 y.o. female MRN: [id number]  DOB: 1951-05-27  PRE-OPERATIVE DIAGNOSIS: Degenerative disc disease  POST-OPERATIVE DIAGNOSIS:  Same  PROCEDURE: Anterior exposure for L4-5 and L5-S1 disc fusion  SURGEON:  Curt Jews, M.D.  Co-surgeon for the exposure Dr. Rolena Infante  PHYSICIAN ASSISTANT: Estill Bamberg Ward, PA-C  The assistant was needed for exposure and to expedite the case  ANESTHESIA: General  EBL: per anesthesia record  Total I/O In: 6237 [I.V.:3500; IV Piggyback:850] Out: 450 [Urine:200; Blood:250]  BLOOD ADMINISTERED: none  DRAINS: none  SPECIMEN: none  COUNTS CORRECT:  YES  PATIENT DISPOSITION:  PACU - hemodynamically stable  PROCEDURE DETAILS: The patient was taken to the operating placed supine this where the area of the abdomen was prepped and draped in usual sterile fashion.  Crosstable lateral was used to identify the level of the L4-5 and L5-S1 disc and this was marked on the surface of the skin.  A left paramedian incision was made and carried down through the subcutaneous fat to the level of the anterior rectus sheath.  The anterior rectus sheath was opened in line with the skin incision.  The retroperitoneal space was entered bluntly in the left lower quadrant and the intraperitoneal contents were mobilized to the right.  The left ureter was identified to the right.  The posterior sheath was opened laterally.  There was entry into the peritoneum at this level and this peritoneal rent was closed with a running 3-0 Vicryl suture.  Dissection over the L5-S1 disc was done bluntly.  The L5-S1 disc was exposed between the iliac vessels.  The middle sacral vessels were clipped and divided.  Next attention was turned to the L4-5 disc.  This was exposed with mobilization of the aorta and iliac vessels to the right.  The iliolumbar vein was ligated and divided.  Blunt dissection was used to give exposure to  the right and left of the L4-5 disc.  C-arm was brought back onto the field.  A spinal needle was positioned in the L5-S1 disc after placement of the self-retaining retractor.  Blades were positioned to the right and left of the L5-S1 disc and also inferiorly and superiorly.  C arm confirmed that this was ill 5 S1 disc.  The discectomy and fusion will be dictated as a separate note by Dr. Rolena Infante.  I then scrubbed back into the case and repositioned the retractor to the L4-5 level.  Again blades were positioned to the right and left of the L4-5 disc and also superiorly and inferiorly.  The remainder of the dictation will be dictated as a separate note by Dr. Lynnea Ferrier, M.D., [facility] 05/01/2020 4:20 PM

## 2020-05-01 NOTE — Anesthesia Procedure Notes (Signed)
Arterial Line Insertion Start/End7/04/2020 7:40 AM, 05/01/2020 7:50 AM Performed by: Lance Coon, CRNA, CRNA  Patient location: OR. Preanesthetic checklist: patient identified, IV checked, site marked, risks and benefits discussed, surgical consent, monitors and equipment checked, pre-op evaluation, timeout performed and anesthesia consent Lidocaine 1% used for infiltration Left, radial was placed Catheter size: 20 G Hand hygiene performed  and maximum sterile barriers used   Attempts: 1 Procedure performed without using ultrasound guided technique. Following insertion, dressing applied and Biopatch. Post procedure assessment: normal and unchanged  Patient tolerated the procedure well with no immediate complications.

## 2020-05-01 NOTE — Anesthesia Procedure Notes (Signed)
Procedure Name: Intubation Date/Time: 05/01/2020 7:43 AM Performed by: Lance Coon, CRNA Pre-anesthesia Checklist: Patient identified, Emergency Drugs available, Suction available, Patient being monitored and Timeout performed Patient Re-evaluated:Patient Re-evaluated prior to induction Oxygen Delivery Method: Circle system utilized Preoxygenation: Pre-oxygenation with 100% oxygen Induction Type: IV induction Ventilation: Mask ventilation without difficulty Laryngoscope Size: Miller and 2 Grade View: Grade II Tube type: Oral Tube size: 7.0 mm Number of attempts: 1 Airway Equipment and Method: Stylet Placement Confirmation: ETT inserted through vocal cords under direct vision,  positive ETCO2 and breath sounds checked- equal and bilateral Secured at: 21 cm Tube secured with: Tape Dental Injury: Teeth and Oropharynx as per pre-operative assessment

## 2020-05-01 NOTE — Anesthesia Procedure Notes (Signed)
Anesthesia Regional Block: TAP block   Pre-Anesthetic Checklist: ,, timeout performed, Correct Patient, Correct Site, Correct Laterality, Correct Procedure, Correct Position, site marked, Risks and benefits discussed,  Surgical consent,  Pre-op evaluation,  At surgeon's request and post-op pain management  Laterality: Left  Prep: chloraprep       Needles:  Injection technique: Single-shot  Needle Type: Echogenic Needle     Needle Length: 9cm  Needle Gauge: 21     Additional Needles:   Procedures:,,,, ultrasound used (permanent image in chart),,,,  Narrative:  Start time: 05/01/2020 8:04 AM End time: 05/01/2020 8:11 AM Injection made incrementally with aspirations every 5 mL.  Performed by: Personally  Anesthesiologist: Suzette Battiest, MD

## 2020-05-02 ENCOUNTER — Encounter (HOSPITAL_COMMUNITY)
Admission: RE | Disposition: A | Discharge status: Home / Self Care | Payer: Self-pay | Source: Home / Self Care | Attending: Orthopedic Surgery

## 2020-05-02 ENCOUNTER — Inpatient Hospital Stay (HOSPITAL_COMMUNITY): Admission: RE | Admit: 2020-05-02 | Payer: Medicare PPO | Source: Home / Self Care | Admitting: Orthopedic Surgery

## 2020-05-02 ENCOUNTER — Encounter (HOSPITAL_COMMUNITY): Payer: Self-pay | Admitting: Orthopedic Surgery

## 2020-05-02 ENCOUNTER — Inpatient Hospital Stay (HOSPITAL_COMMUNITY): Payer: Medicare PPO | Admitting: Anesthesiology

## 2020-05-02 ENCOUNTER — Inpatient Hospital Stay (HOSPITAL_COMMUNITY): Payer: Medicare PPO

## 2020-05-02 LAB — CBC
HCT: 33.3 % — ABNORMAL LOW (ref 36.0–46.0)
Hemoglobin: 10.6 g/dL — ABNORMAL LOW (ref 12.0–15.0)
MCH: 31.5 pg (ref 26.0–34.0)
MCHC: 31.8 g/dL (ref 30.0–36.0)
MCV: 99.1 fL (ref 80.0–100.0)
Platelets: 162 10*3/uL (ref 150–400)
RBC: 3.36 MIL/uL — ABNORMAL LOW (ref 3.87–5.11)
RDW: 13.1 % (ref 11.5–15.5)
WBC: 11.7 10*3/uL — ABNORMAL HIGH (ref 4.0–10.5)
nRBC: 0 % (ref 0.0–0.2)

## 2020-05-02 LAB — CREATININE, SERUM
Creatinine, Ser: 1.12 mg/dL — ABNORMAL HIGH (ref 0.44–1.00)
GFR calc Af Amer: 58 mL/min — ABNORMAL LOW (ref >60)
GFR calc non Af Amer: 50 mL/min — ABNORMAL LOW (ref >60)

## 2020-05-02 SURGERY — POSTERIOR LUMBAR FUSION 2 LEVEL
Anesthesia: General

## 2020-05-02 MED ORDER — LACTATED RINGERS IV SOLN
INTRAVENOUS | Status: DC | PRN
Start: 2020-05-02 — End: 2020-05-02

## 2020-05-02 MED ORDER — 0.9 % SODIUM CHLORIDE (POUR BTL) OPTIME
TOPICAL | Status: DC | PRN
  Administered 2020-05-02: 1000 mL

## 2020-05-02 MED ORDER — LACTATED RINGERS IV SOLN: INTRAVENOUS | Status: DC

## 2020-05-02 MED ORDER — SODIUM CHLORIDE 0.9 % IV SOLN: 250.0000 mL | INTRAVENOUS | Status: DC

## 2020-05-02 MED ORDER — OXYCODONE HCL 5 MG/5ML PO SOLN: 5.0000 mg | Freq: Once | ORAL | Status: DC | PRN

## 2020-05-02 MED ORDER — METHOCARBAMOL 1000 MG/10ML IJ SOLN
500.0000 mg | Freq: Four times a day (QID) | INTRAVENOUS | Status: DC | PRN
  Filled 2020-05-02 (×2): qty 5

## 2020-05-02 MED ORDER — CHLORHEXIDINE GLUCONATE 0.12 % MT SOLN: 15.0000 mL | Freq: Once | OROMUCOSAL | Status: DC

## 2020-05-02 MED ORDER — HEMOSTATIC AGENTS (NO CHARGE) OPTIME
TOPICAL | Status: DC | PRN
  Administered 2020-05-02: 1 via TOPICAL

## 2020-05-02 MED ORDER — POLYETHYLENE GLYCOL 3350 17 G PO PACK: 17.0000 g | PACK | Freq: Every day | ORAL | Status: DC | PRN

## 2020-05-02 MED ORDER — ENOXAPARIN SODIUM 40 MG/0.4ML ~~LOC~~ SOLN
40.0000 mg | SUBCUTANEOUS | Status: DC
  Administered 2020-05-03 – 2020-05-04 (×2): 40 mg via SUBCUTANEOUS
  Filled 2020-05-02 (×2): qty 0.4

## 2020-05-02 MED ORDER — FENTANYL CITRATE (PF) 100 MCG/2ML IJ SOLN
25.0000 ug | INTRAMUSCULAR | Status: DC | PRN
  Administered 2020-05-02 (×4): 25 ug via INTRAVENOUS

## 2020-05-02 MED ORDER — ONDANSETRON HCL 4 MG PO TABS: 4.0000 mg | ORAL_TABLET | Freq: Four times a day (QID) | ORAL | Status: DC | PRN

## 2020-05-02 MED ORDER — SODIUM CHLORIDE 0.9% FLUSH: 3.0000 mL | INTRAVENOUS | Status: DC | PRN

## 2020-05-02 MED ORDER — DOCUSATE SODIUM 100 MG PO CAPS
100.0000 mg | ORAL_CAPSULE | Freq: Two times a day (BID) | ORAL | Status: DC
  Administered 2020-05-02 – 2020-05-04 (×4): 100 mg via ORAL
  Filled 2020-05-02 (×4): qty 1

## 2020-05-02 MED ORDER — MIDAZOLAM HCL 2 MG/2ML IJ SOLN
INTRAMUSCULAR | Status: DC | PRN
  Administered 2020-05-02: 2 mg via INTRAVENOUS

## 2020-05-02 MED ORDER — ONDANSETRON HCL 4 MG/2ML IJ SOLN
INTRAMUSCULAR | Status: DC | PRN
  Administered 2020-05-02: 4 mg via INTRAVENOUS

## 2020-05-02 MED ORDER — THROMBIN 20000 UNITS EX SOLR
CUTANEOUS | Status: AC
  Filled 2020-05-02: qty 20000

## 2020-05-02 MED ORDER — PHENYLEPHRINE HCL-NACL 10-0.9 MG/250ML-% IV SOLN
INTRAVENOUS | Status: DC | PRN
Start: 2020-05-02 — End: 2020-05-02
  Administered 2020-05-02: 100 ug/min via INTRAVENOUS

## 2020-05-02 MED ORDER — CHLORHEXIDINE GLUCONATE 0.12 % MT SOLN
OROMUCOSAL | Status: AC
  Administered 2020-05-02: 15 mL
  Filled 2020-05-02: qty 15

## 2020-05-02 MED ORDER — PROPOFOL 500 MG/50ML IV EMUL
INTRAVENOUS | Status: DC | PRN
Start: 2020-05-02 — End: 2020-05-02
  Administered 2020-05-02: 40 ug/kg/min via INTRAVENOUS

## 2020-05-02 MED ORDER — ENOXAPARIN SODIUM 40 MG/0.4ML ~~LOC~~ SOLN
40.0000 mg | SUBCUTANEOUS | 0 refills | Status: DC
Start: 2020-05-02 — End: 2021-03-05

## 2020-05-02 MED ORDER — BUPIVACAINE LIPOSOME 1.3 % IJ SUSP
20.0000 mL | Freq: Once | INTRAMUSCULAR | Status: DC
  Filled 2020-05-02: qty 20

## 2020-05-02 MED ORDER — PROPOFOL 10 MG/ML IV BOLUS
INTRAVENOUS | Status: DC | PRN
  Administered 2020-05-02: 150 mg via INTRAVENOUS

## 2020-05-02 MED ORDER — SURGIFOAM 100 EX MISC
CUTANEOUS | Status: DC | PRN
  Administered 2020-05-02: 1 via TOPICAL

## 2020-05-02 MED ORDER — DEXAMETHASONE SODIUM PHOSPHATE 10 MG/ML IJ SOLN
INTRAMUSCULAR | Status: DC | PRN
  Administered 2020-05-02: 10 mg via INTRAVENOUS

## 2020-05-02 MED ORDER — LIDOCAINE 2% (20 MG/ML) 5 ML SYRINGE
INTRAMUSCULAR | Status: DC | PRN
  Administered 2020-05-02: 40 mg via INTRAVENOUS

## 2020-05-02 MED ORDER — METHOCARBAMOL 500 MG PO TABS: 500.0000 mg | ORAL_TABLET | Freq: Three times a day (TID) | ORAL | 0 refills | Status: AC | PRN

## 2020-05-02 MED ORDER — CEFAZOLIN SODIUM-DEXTROSE 2-4 GM/100ML-% IV SOLN
2.0000 g | INTRAVENOUS | Status: AC
  Administered 2020-05-02: 2 g via INTRAVENOUS
  Filled 2020-05-02: qty 100

## 2020-05-02 MED ORDER — ROCURONIUM BROMIDE 10 MG/ML (PF) SYRINGE
PREFILLED_SYRINGE | INTRAVENOUS | Status: DC | PRN
  Administered 2020-05-02: 50 mg via INTRAVENOUS

## 2020-05-02 MED ORDER — MENTHOL 3 MG MT LOZG: 1.0000 | LOZENGE | OROMUCOSAL | Status: DC | PRN

## 2020-05-02 MED ORDER — OXYCODONE HCL 5 MG PO TABS
10.0000 mg | ORAL_TABLET | ORAL | Status: DC | PRN
  Administered 2020-05-02 – 2020-05-04 (×9): 10 mg via ORAL
  Filled 2020-05-02 (×10): qty 2

## 2020-05-02 MED ORDER — METHOCARBAMOL 500 MG PO TABS
500.0000 mg | ORAL_TABLET | Freq: Four times a day (QID) | ORAL | Status: DC | PRN
  Administered 2020-05-02 – 2020-05-04 (×6): 500 mg via ORAL
  Filled 2020-05-02 (×6): qty 1

## 2020-05-02 MED ORDER — CEFAZOLIN SODIUM-DEXTROSE 1-4 GM/50ML-% IV SOLN
1.0000 g | Freq: Three times a day (TID) | INTRAVENOUS | Status: AC
  Administered 2020-05-02 – 2020-05-03 (×2): 1 g via INTRAVENOUS
  Filled 2020-05-02 (×2): qty 50

## 2020-05-02 MED ORDER — SODIUM CHLORIDE 0.9% FLUSH
3.0000 mL | Freq: Two times a day (BID) | INTRAVENOUS | Status: DC
  Administered 2020-05-02 – 2020-05-03 (×2): 3 mL via INTRAVENOUS

## 2020-05-02 MED ORDER — FENTANYL CITRATE (PF) 250 MCG/5ML IJ SOLN
INTRAMUSCULAR | Status: DC | PRN
  Administered 2020-05-02: 50 ug via INTRAVENOUS
  Administered 2020-05-02: 100 ug via INTRAVENOUS

## 2020-05-02 MED ORDER — OXYCODONE HCL 5 MG PO TABS: 5.0000 mg | ORAL_TABLET | Freq: Once | ORAL | Status: DC | PRN

## 2020-05-02 MED ORDER — BUPIVACAINE-EPINEPHRINE (PF) 0.25% -1:200000 IJ SOLN
INTRAMUSCULAR | Status: AC
  Filled 2020-05-02: qty 30

## 2020-05-02 MED ORDER — BUPIVACAINE LIPOSOME 1.3 % IJ SUSP
INTRAMUSCULAR | Status: DC | PRN
  Administered 2020-05-02: 20 mL

## 2020-05-02 MED ORDER — ACETAMINOPHEN 650 MG RE SUPP: 650.0000 mg | RECTAL | Status: DC | PRN

## 2020-05-02 MED ORDER — OXYCODONE HCL 5 MG PO TABS
5.0000 mg | ORAL_TABLET | ORAL | Status: DC | PRN
  Administered 2020-05-04: 5 mg via ORAL

## 2020-05-02 MED ORDER — SUGAMMADEX SODIUM 200 MG/2ML IV SOLN
INTRAVENOUS | Status: DC | PRN
  Administered 2020-05-02: 200 mg via INTRAVENOUS

## 2020-05-02 MED ORDER — GABAPENTIN 300 MG PO CAPS
300.0000 mg | ORAL_CAPSULE | Freq: Three times a day (TID) | ORAL | Status: DC
  Administered 2020-05-02 – 2020-05-04 (×4): 300 mg via ORAL
  Filled 2020-05-02 (×5): qty 1

## 2020-05-02 MED ORDER — OXYCODONE-ACETAMINOPHEN 10-325 MG PO TABS: 1.0000 | ORAL_TABLET | Freq: Four times a day (QID) | ORAL | 0 refills | Status: AC | PRN

## 2020-05-02 MED ORDER — BUPIVACAINE-EPINEPHRINE 0.25% -1:200000 IJ SOLN
INTRAMUSCULAR | Status: DC | PRN
  Administered 2020-05-02 (×3): 10 mL

## 2020-05-02 MED ORDER — FENTANYL CITRATE (PF) 100 MCG/2ML IJ SOLN
INTRAMUSCULAR | Status: AC
  Filled 2020-05-02: qty 2

## 2020-05-02 MED ORDER — ONDANSETRON HCL 4 MG/2ML IJ SOLN: 4.0000 mg | Freq: Four times a day (QID) | INTRAMUSCULAR | Status: DC | PRN

## 2020-05-02 MED ORDER — ONDANSETRON HCL 4 MG/2ML IJ SOLN: 4.0000 mg | Freq: Once | INTRAMUSCULAR | Status: DC | PRN

## 2020-05-02 MED ORDER — PHENOL 1.4 % MT LIQD: 1.0000 | OROMUCOSAL | Status: DC | PRN

## 2020-05-02 MED ORDER — PHENYLEPHRINE 40 MCG/ML (10ML) SYRINGE FOR IV PUSH (FOR BLOOD PRESSURE SUPPORT)
PREFILLED_SYRINGE | INTRAVENOUS | Status: DC | PRN
  Administered 2020-05-02: 40 ug via INTRAVENOUS
  Administered 2020-05-02 (×4): 80 ug via INTRAVENOUS
  Administered 2020-05-02: 40 ug via INTRAVENOUS

## 2020-05-02 MED ORDER — ACETAMINOPHEN 325 MG PO TABS
650.0000 mg | ORAL_TABLET | ORAL | Status: DC | PRN
  Administered 2020-05-02 – 2020-05-04 (×4): 650 mg via ORAL
  Filled 2020-05-02 (×4): qty 2

## 2020-05-02 MED ORDER — LACTATED RINGERS IV SOLN: INTRAVENOUS | Status: DC | PRN

## 2020-05-02 MED ORDER — MORPHINE SULFATE (PF) 2 MG/ML IV SOLN: 2.0000 mg | INTRAVENOUS | Status: AC | PRN

## 2020-05-02 MED ORDER — ONDANSETRON HCL 4 MG PO TABS: 4.0000 mg | ORAL_TABLET | Freq: Three times a day (TID) | ORAL | 0 refills | Status: DC | PRN

## 2020-05-02 MED FILL — Thrombin (Recombinant) For Soln 20000 Unit: CUTANEOUS | Qty: 1 | Status: AC

## 2020-05-02 SURGICAL SUPPLY — 71 items
AGENT HMST KT MTR STRL THRMB (HEMOSTASIS) ×2
BLADE CLIPPER SURG (BLADE) IMPLANT
BUR EGG ELITE 4.0 (BURR) IMPLANT
BUR EGG ELITE 4.0MM (BURR)
CABLE BIPOLOR RESECTION CORD (MISCELLANEOUS) ×3 IMPLANT
CLIP NEUROVISION LG (CLIP) ×2 IMPLANT
CLOSURE STERI-STRIP 1/2X4 (GAUZE/BANDAGES/DRESSINGS) ×1
CLSR STERI-STRIP ANTIMIC 1/2X4 (GAUZE/BANDAGES/DRESSINGS) ×2 IMPLANT
COVER MAYO STAND STRL (DRAPES) IMPLANT
COVER SURGICAL LIGHT HANDLE (MISCELLANEOUS) ×3 IMPLANT
COVER WAND RF STERILE (DRAPES) ×3 IMPLANT
DRAPE C-ARM 42X72 X-RAY (DRAPES) ×3 IMPLANT
DRAPE C-ARMOR (DRAPES) ×3 IMPLANT
DRAPE POUCH INSTRU U-SHP 10X18 (DRAPES) ×3 IMPLANT
DRAPE SURG 17X23 STRL (DRAPES) ×3 IMPLANT
DRAPE U-SHAPE 47X51 STRL (DRAPES) ×3 IMPLANT
DRSG OPSITE POSTOP 4X6 (GAUZE/BANDAGES/DRESSINGS) ×2 IMPLANT
DRSG OPSITE POSTOP 4X8 (GAUZE/BANDAGES/DRESSINGS) ×3 IMPLANT
DURAPREP 26ML APPLICATOR (WOUND CARE) ×3 IMPLANT
ELECT BLADE 4.0 EZ CLEAN MEGAD (MISCELLANEOUS)
ELECT BLADE 6.5 EXT (BLADE) ×3 IMPLANT
ELECT CAUTERY BLADE 6.4 (BLADE) ×3 IMPLANT
ELECT PENCIL ROCKER SW 15FT (MISCELLANEOUS) ×3 IMPLANT
ELECT REM PT RETURN 9FT ADLT (ELECTROSURGICAL) ×3
ELECTRODE BLDE 4.0 EZ CLN MEGD (MISCELLANEOUS) IMPLANT
ELECTRODE REM PT RTRN 9FT ADLT (ELECTROSURGICAL) ×1 IMPLANT
GLOVE BIOGEL PI IND STRL 8.5 (GLOVE) ×1 IMPLANT
GLOVE BIOGEL PI INDICATOR 8.5 (GLOVE) ×2
GLOVE SS BIOGEL STRL SZ 8.5 (GLOVE) ×1 IMPLANT
GLOVE SUPERSENSE BIOGEL SZ 8.5 (GLOVE) ×2
GOWN STRL REUS W/ TWL LRG LVL3 (GOWN DISPOSABLE) ×1 IMPLANT
GOWN STRL REUS W/TWL 2XL LVL3 (GOWN DISPOSABLE) ×6 IMPLANT
GOWN STRL REUS W/TWL LRG LVL3 (GOWN DISPOSABLE) ×3
GUIDEWIRE NITINOL BEVEL TIP (WIRE) ×12 IMPLANT
KIT BASIN OR (CUSTOM PROCEDURE TRAY) ×3 IMPLANT
KIT POSITION SURG JACKSON T1 (MISCELLANEOUS) ×3 IMPLANT
KIT TURNOVER KIT B (KITS) ×3 IMPLANT
MODULE EMG NDL SSEP NVM5 (NEEDLE) IMPLANT
MODULE EMG NEEDLE SSEP NVM5 (NEEDLE) ×3 IMPLANT
MODULE NVM5 NEXT GEN EMG (NEEDLE) ×2 IMPLANT
NDL SPNL 18GX3.5 QUINCKE PK (NEEDLE) ×1 IMPLANT
NEEDLE 22X1 1/2 (OR ONLY) (NEEDLE) ×3 IMPLANT
NEEDLE SPNL 18GX3.5 QUINCKE PK (NEEDLE) ×3 IMPLANT
NS IRRIG 1000ML POUR BTL (IV SOLUTION) ×3 IMPLANT
PACK LAMINECTOMY ORTHO (CUSTOM PROCEDURE TRAY) ×3 IMPLANT
PACK UNIVERSAL I (CUSTOM PROCEDURE TRAY) ×3 IMPLANT
PAD ARMBOARD 7.5X6 YLW CONV (MISCELLANEOUS) ×6 IMPLANT
PATTIES SURGICAL .5 X.5 (GAUZE/BANDAGES/DRESSINGS) IMPLANT
PATTIES SURGICAL .5 X1 (DISPOSABLE) ×3 IMPLANT
POSITIONER HEAD PRONE TRACH (MISCELLANEOUS) ×3 IMPLANT
PROBE BALL TIP NVM5 SNG USE (BALLOONS) ×2 IMPLANT
ROD RELINE MAS LORDOTIC 5.5X60 (Rod) ×4 IMPLANT
SCREW LOCK RELINE 5.5 TULIP (Screw) ×12 IMPLANT
SCREW RELINE MAS POLY 6.5X40MM (Screw) ×12 IMPLANT
SPONGE LAP 4X18 RFD (DISPOSABLE) ×6 IMPLANT
SPONGE SURGIFOAM ABS GEL 100 (HEMOSTASIS) ×3 IMPLANT
SURGIFLO W/THROMBIN 8M KIT (HEMOSTASIS) ×4 IMPLANT
SUT BONE WAX W31G (SUTURE) ×3 IMPLANT
SUT MNCRL AB 3-0 PS2 18 (SUTURE) ×6 IMPLANT
SUT VIC AB 1 CT1 18XCR BRD 8 (SUTURE) ×1 IMPLANT
SUT VIC AB 1 CT1 8-18 (SUTURE) ×6
SUT VIC AB 1 CTX 36 (SUTURE)
SUT VIC AB 1 CTX36XBRD ANBCTR (SUTURE) IMPLANT
SUT VIC AB 2-0 CT1 18 (SUTURE) ×5 IMPLANT
SYR BULB IRRIG 60ML STRL (SYRINGE) ×3 IMPLANT
SYR CONTROL 10ML LL (SYRINGE) ×3 IMPLANT
TOWEL GREEN STERILE (TOWEL DISPOSABLE) ×3 IMPLANT
TOWEL GREEN STERILE FF (TOWEL DISPOSABLE) ×3 IMPLANT
TRAY FOLEY MTR SLVR 16FR STAT (SET/KITS/TRAYS/PACK) ×1 IMPLANT
WATER STERILE IRR 1000ML POUR (IV SOLUTION) ×3 IMPLANT
YANKAUER SUCT BULB TIP NO VENT (SUCTIONS) ×3 IMPLANT

## 2020-05-02 NOTE — Discharge Instructions (Signed)
  Spinal Fusion, Adult, Care After This sheet gives you information about how to care for yourself after your procedure. Your doctor may also give you more specific instructions. If you have problems or questions, contact your doctor. Follow these instructions at home: Medicines Take over-the-counter and prescription medicines only as told by your doctor. These include any medicines for pain or blood-thinning medicines (anticoagulants). If you were prescribed an antibiotic medicine, take it as told by your doctor. Do not stop taking the antibiotic even if you start to feel better. Do not drive for 24 hours if you were given a medicine to help you relax (sedative) during your procedure. Do not drive or use heavy machinery while taking prescription pain medicine. If you have a brace: Wear the brace as told by your doctor. Take it off only as told by your doctor. Keep the brace clean. Managing pain, stiffness, and swelling If directed, put ice on the surgery area: If you have a removable brace, take it off as told by your doctor. Put ice in a plastic bag. Place a towel between your skin and the bag. Leave the ice on for 20 minutes, 2-3 times a day. Surgery cut care    Follow instructions from your doctor about how to take care of your cut from surgery (incision). Make sure you: Wash your hands with soap and water before you change your bandage (dressing). If you cannot use soap and water, use hand sanitizer. Change your bandage as told by your doctor. Leave stitches (sutures), skin glue, or skin tape (adhesive) strips in place. They may need to stay in place for 2 weeks or longer. If tape strips get loose and curl up, you may trim the loose edges. Do not remove tape strips completely unless your doctor says it is okay. Keep your cut from surgery clean and dry. Do not take baths, swim, or use a hot tub until your doctor says it is okay. Ask your doctor if you can take showers. You may only  be allowed to take sponge baths. Every day, check your cut from surgery and the area around it for: More redness, swelling, or pain. Fluid or blood. Warmth. Pus or a bad smell. If you have a drain tube, follow instructions from your doctor about caring for it. Do not take out the drain tube or any bandages unless your doctor says it is okay. Physical activity Rest and protect your back as much as possible. Follow instructions from your doctor about how to move. Use good posture to help your spine heal. Do not lift anything that is heavier than 8 lb (3.6 kg), or the limit that you are told, until your doctor says that it is safe. Do not twist or bend at the waist until your doctor says it is okay. It is best if you: Do not make pushing and pulling motions. Do not sit or lie down in the same position for a long time. Do not raise your hands or arms above your head. Return to your normal activities as told by your doctor. Ask your doctor what activities are safe for you. Rest and protect your back as much as you can. Do not start to exercise until your doctor says it is okay. Ask your doctor what kinds of exercise you can do to make your back stronger. Ok to shower in 5 days.  Do not take a bath or submerge the wound General instructions To prevent blood clots and lessen swelling   in your legs: Wear compression stockings as told. Walk one or more times every few hours as told by your doctor. Do not use any products that contain nicotine or tobacco, such as cigarettes and e-cigarettes. These can delay bone healing. If you need help quitting, ask your doctor. To prevent or treat constipation while you are taking prescription pain medicine, your doctor may suggest that you: Drink enough fluid to keep your pee (urine) pale yellow. Take over-the-counter or prescription medicines. Eat foods that are high in fiber. These include fresh fruits and vegetables, whole grains, and beans. Limit foods that  are high in fat and processed sugars, such as fried and sweet foods. Keep all follow-up visits as told by your doctor. This is important. Contact a doctor if: Your pain gets worse. Your medicine does not help your pain. Your legs or feet get painful or swollen. Your cut from surgery is more red, swollen, or painful. Your cut from surgery feels warm to the touch. You have: Fluid or blood coming from your cut from surgery. Pus or a bad smell coming from your cut from surgery. A fever. Weakness or loss of feeling (numbness) in your legs that is new or getting worse. Trouble controlling when you pee (urinate) or poop (have a bowel movement). You feel sick to your stomach (nauseous). You throw up (vomit). Get help right away if: Your pain is very bad. You have chest pain. You have trouble breathing. You start to have a cough. These symptoms may be an emergency. Do not wait to see if the symptoms will go away. Get medical help right away. Call your local emergency services (911 in the [place] Do not drive yourself to the hospital. Summary After the procedure, it is common to have pain in your back and pain by your surgery cut(s). Icing and pain medicines may help to control the pain. Follow directions from your doctor. Rest and protect your back as much as possible. Do not twist or bend at the waist. Get up and walk one or more times every few hours as told by your doctor. This information is not intended to replace advice given to you by your health care provider. Make sure you discuss any questions you have with your health care provider.  -signs and symptoms of a blood clot such as chest pain; shortness of breath; pain, swelling, or warmth in the leg -signs and symptoms of a stroke such as changes in vision; confusion; trouble speaking or understanding; severe headaches; sudden numbness or weakness of the face, arm or leg; trouble walking; dizziness; loss of coordination Side effects that  usually do not require medical attention (report to your doctor or health care professional if they continue or are bothersome): -hair loss -pain, redness, or irritation at site where injected This list may not describe all possible side effects. Call your doctor for medical advice about side effects. You may report side effects to FDA at 1-800-FDA-1088. Where should I keep my medicine? Keep out of the reach of children. Store at room temperature between 15 and 30 degrees C (59 and 86 degrees F). Do not freeze. If your injections have been specially prepared, you may need to store them in the refrigerator. Ask your pharmacist. Throw away any unused medicine after the expiration date. NOTE: This sheet is a summary. It may not cover all possible information. If you have questions about this medicine, talk to your doctor, pharmacist, or health care provider.       F). Do not freeze. If your injections have been specially prepared, you may need to store them in the refrigerator. Ask your pharmacist. Throw away any unused medicine after the expiration date. NOTE: This sheet is a summary. It may not cover all possible information. If you have questions about this medicine, talk to your doctor, pharmacist, or health care provider.

## 2020-05-02 NOTE — Progress Notes (Signed)
No issues overnight. Patient reports pain as mild. Significant improvement in neuropathic radicular leg pain. Does complain of groin/anterior thigh pain which is to be expected secondary to anterior approach and retraction. Plan is to move forward with posterior pedicle screws/instrumented fusion L4-S1 today.   [facility] Ermel Verne PA-C

## 2020-05-02 NOTE — Transfer of Care (Signed)
Immediate Anesthesia Transfer of Care Note  Patient: Carol Cox  Procedure(s) Performed:  POSTERIOR LUMBAR FUSION L4-S1 (N/A )  Patient Location: PACU  Anesthesia Type:General  Level of Consciousness: drowsy, patient cooperative and responds to stimulation  Airway & Oxygen Therapy: Patient Spontanous Breathing and Patient connected to face mask oxygen  Post-op Assessment: Report given to RN, Post -op Vital signs reviewed and stable and Patient moving all extremities X 4  Post vital signs: Reviewed and stable    Last Vitals:  Vitals Value Taken Time  BP 113/55 05/02/20 1222  Temp    Pulse 92 05/02/20 1225  Resp 9 05/02/20 1225  SpO2 99 % 05/02/20 1225  Vitals shown include unvalidated device data.  Last Pain:  Vitals:   05/02/20 0750  TempSrc:   PainSc: 3       Patients Stated Pain Goal: 3 (73/40/37 0964)  Complications: No complications documented.

## 2020-05-02 NOTE — Anesthesia Procedure Notes (Signed)
Procedure Name: Intubation Date/Time: 05/02/2020 9:25 AM Performed by: Michele Rockers, CRNA Pre-anesthesia Checklist: Patient identified, Emergency Drugs available, Suction available and Patient being monitored Patient Re-evaluated:Patient Re-evaluated prior to induction Oxygen Delivery Method: Circle system utilized Preoxygenation: Pre-oxygenation with 100% oxygen Induction Type: IV induction Ventilation: Mask ventilation without difficulty and Oral airway inserted - appropriate to patient size Laryngoscope Size: Sabra Heck and 2 Grade View: Grade I Tube type: Oral Number of attempts: 1 Airway Equipment and Method: Stylet and Oral airway Placement Confirmation: ETT inserted through vocal cords under direct vision,  positive ETCO2 and breath sounds checked- equal and bilateral Secured at: 21 cm Tube secured with: Tape Dental Injury: Teeth and Oropharynx as per pre-operative assessment

## 2020-05-02 NOTE — Op Note (Signed)
Operative report  Preoperative diagnosis degenerative lumbar disc disease with radicular leg pain L4-S1.  Status post anterior lumbar interbody fusion L4-5 and L5-S1.  Postoperative diagnosis: Same  Operative procedure: Posterior supplemental pedicle screw fixation L4-S1.  Complications: None  First Assistant: Cleta Alberts, PA  Implants: NuVasive MIS pedicle screw fixation.  6.5 x 40 mm length screws.  60 mm rod.  Neuro monitoring: No adverse intraoperative EMG/SSEP activity noted.  All 6 pedicle screws were directly stimulated and there was no adverse activity at greater than 40 mA.  Indications: Carol Cox is a very pleasant 69 year old man who had an anterior lumbar interbody fusion L4-S1 yesterday.  She returns today for the planned posterior instrumentation.  She notes in the preop holding area that the preoperative neuropathic leg pain had significantly improved.  She did complain of anterior groin pain, this is expected given the prior anterior approach to the lumbar spine.  Since the neuropathic pain has significantly improved we elected to move forward with just supplementing the anterior interbody fusion with posterior pedicle screw fixation.  All appropriate risks benefits and alternatives to surgery were discussed and consent was obtained.  Operative report: Patient was brought to the operating room placed upon the operating room table.  After successful induction of general anesthesia and endotracheal intubation the patient was turned prone onto the [facility] spine frame.  All bony prominences were well-padded the Foley was properly positioned and the neuro monitoring rep placed all appropriate pads and needles for intraoperative SSEP and free running EMG activity monitoring.  The back was then prepped and draped in a standard fashion.  Timeout was taken to confirm patient procedure and all other important data.  Using fluoroscopy identified the lateral border of the L4 pedicle.  I marked  this out and then infiltrated the skin area with quarter percent Marcaine with epinephrine.  I then made a small incision and then advanced the Jamshidi needle percutaneously down to the lateral aspect of the L4 pedicle.  Once I confirm satisfactory position on the pedicle in the AP view I advanced the Jamshidi needle into the pedicle.  Utilizing both x-ray and real-time neuro monitoring I advanced the Jamshidi needle to the medial wall of the pedicle.  Once I was nearing this I switched to the lateral view to confirm that I was just beyond the posterior wall of the vertebral body.  This confirmed satisfactory trajectory and positioning of the Jamshidi needle.  I then advanced into the vertebral body and then placed the guidepin to cannulate the pedicle.  I remove the Jamshidi needle and repeated this exact same procedure at L5 and S1.  I then went to the contralateral side and using the same technique cannulated the L4-S1 pedicles.  Once all 6 pedicles were cannulated I then measured and then placed the 6.5 x 40 mm pedicle screws at each level.  The pedicle screw was advanced over the guidepin and advanced over the guidepin into the pedicle.  I directly stimulated the insertion device while placing the pedicle screw.  I used my lateral fluoroscopy to confirm satisfactory positioning of the pedicle.  Once all 6 pedicle screws were properly positioned I directly stimulated each of the screws to ensure there was no free running EMG activity to suggest breach of the pedicle.  At this point with all 6 screws properly positioned I then measured and placed the appropriate size rod.  I then placed the locking caps and secured them down.  I confirmed in both the  AP and lateral planes that the rod was properly positioned across each of the 3 pedicle screws.  The locking caps were then tightened and torqued according manufacture standards.  The insertion tabs were then broken off and removed.  X-rays were taken which  confirmed satisfactory bilateral pedicle screw rod placement.  At this point I irrigated the wounds copiously normal saline and then injected quarter percent Marcaine with Exparel into the paraspinal muscles for postoperative analgesia.  After final irrigation I used bipolar cautery and FloSeal to obtain hemostasis.  The deep fascia was then closed with interrupted #1 Vicryl suture, then a layer 2-0 Vicryl suture, and finally a 3-0 Monocryl for the skin.  Steri-Strips and a dry dressing were applied and the patient was ultimately extubated transfer the PACU without incident.  The end of the case all needle sponge counts were correct.

## 2020-05-02 NOTE — Progress Notes (Signed)
Report given to Anesthesia / staff at this time. Patient ready for surgery.

## 2020-05-02 NOTE — Anesthesia Postprocedure Evaluation (Signed)
Anesthesia Post Note  Patient: Carol Cox  Procedure(s) Performed: ANTERIOR LATERAL LUMBAR FUSION L4-S1 (N/A ) ABDOMINAL EXPOSURE (N/A )     Patient location during evaluation: PACU Anesthesia Type: General Level of consciousness: awake and alert Pain management: pain level controlled Vital Signs Assessment: post-procedure vital signs reviewed and stable Respiratory status: spontaneous breathing, nonlabored ventilation, respiratory function stable and patient connected to nasal cannula oxygen Cardiovascular status: blood pressure returned to baseline and stable Postop Assessment: no apparent nausea or vomiting Anesthetic complications: no   No complications documented.  Last Vitals:  Vitals:   05/02/20 1445 05/02/20 1507  BP:  (!) 106/57  Pulse:  79  Resp:    Temp: 36.7 C 36.9 C  SpO2:  94%    Last Pain:  Vitals:   05/02/20 1612  TempSrc:   PainSc: 7                  Tiajuana Amass

## 2020-05-02 NOTE — Evaluation (Signed)
Physical Therapy Evaluation Patient Details Name: Carol Cox MRN: [id number] DOB: [birth year] Today's Date: 05/02/2020   History of Present Illness  Pt is a 69 yo female presenting s/p posterior pedicle screw fixation L4-S1 on 7/8 following ALIF L4-S1 7/7 due to ongoing LBP that raadiated to LLE and buttocks. PMH includes: HTN, L TKA, CAD, and osteoporosis.  Clinical Impression  Pt in bed upon arrival of PT, agreeable to evaluation at this time. Prior to admission the pt was independent with all ambulation, ADLs, and IADLs, but limited in endurance by pain. The pt now presents with limitations in functional mobility, strength, dynamic stability, and endurance due to above dx, and will continue to benefit from skilled PT to address these deficits. The pt was able to demo good tolerance for hallway ambulation today, but demonstrates increased need for UE support to maintain stability at this time and will continue to benefit from skilled PT to further progress stability and confidence with mobility prior to d/c home.      Follow Up Recommendations Home health PT;Supervision for mobility/OOB    Equipment Recommendations   (pt has needed equipment)    Recommendations for Other Services       Precautions / Restrictions Precautions Precautions: Back;Fall Precaution Booklet Issued: Yes (comment) Precaution Comments: verbally reviewed with the pt and her husband Required Braces or Orthoses: Spinal Brace Spinal Brace: Lumbar corset;Applied in sitting position;Other (comment) Spinal Brace Comments: may ambulate to the bathroom without brace Restrictions Weight Bearing Restrictions: No      Mobility  Bed Mobility Overal bed mobility: Needs Assistance Bed Mobility: Rolling;Sidelying to Sit;Sit to Sidelying Rolling: Min guard Sidelying to sit: Min guard     Sit to sidelying: Min guard General bed mobility comments: minG with cues for log-roll. Pt with difficulty maintaining sidelying  while returning BLE to bed  Transfers Overall transfer level: Needs assistance Equipment used: 1 person hand held assist Transfers: Sit to/from Stand Sit to Stand: Min assist         General transfer comment: minA through HHA to steady once in standing, minG to power up with pt holding bed rails  Ambulation/Gait Ambulation/Gait assistance: Min assist Gait Distance (Feet): 250 Feet Assistive device: 1 person hand held assist Gait Pattern/deviations: Step-through pattern Gait velocity: 0.4 m/s Gait velocity interpretation: <1.31 ft/sec, indicative of household ambulator General Gait Details: Pt able to ambulate with HHA of 1 or use of railing in one hand for safety, the pt ambulates with semi-high guard position and slow, cautious steps.     Balance Overall balance assessment: Needs assistance Sitting-balance support: Single extremity supported;Feet supported Sitting balance-Leahy Scale: Good     Standing balance support: Single extremity supported;During functional activity Standing balance-Leahy Scale: Fair Standing balance comment: minA to maintain static stance without UE support                             Pertinent Vitals/Pain Pain Assessment: Faces Faces Pain Scale: Hurts little more Pain Location: spine/surgical site with bed mobility. R hip Pain Descriptors / Indicators: Grimacing;Sore Pain Intervention(s): Limited activity within patient's tolerance;Monitored during session;Repositioned    Home Living Family/patient expects to be discharged to:: Private residence Living Arrangements: Spouse/significant other Available Help at Discharge: Family;Available 24 hours/day Type of Home: House Home Access: Stairs to enter   [employer] of Steps: 3 at side door, no rail OR 5 at front door with rail Home Layout: Two level;Able to live  on main level with bedroom/bathroom Home Equipment: Cane - single point;Walker - 2 wheels;Bedside commode       Prior Function Level of Independence: Independent         Comments: pt reports independence with mobility, limited to ~10 min due to pain. able to complete all ADLs and IADLs, but pain-limited     Hand Dominance        Extremity/Trunk Assessment   Upper Extremity Assessment Upper Extremity Assessment: Defer to OT evaluation;Overall [facility] for tasks assessed    Lower Extremity Assessment Lower Extremity Assessment: Overall WFL for tasks assessed    Cervical / Trunk Assessment Cervical / Trunk Assessment: Normal (spinal surgery)  Communication   Communication: No difficulties  Cognition Arousal/Alertness: Awake/alert Behavior During Therapy: WFL for tasks assessed/performed Overall Cognitive Status: Within Functional Limits for tasks assessed                                               Assessment/Plan    PT Assessment Patient needs continued PT services  PT Problem List Decreased strength;Decreased mobility;Decreased range of motion;Decreased coordination;Decreased activity tolerance;Decreased balance;Pain       PT Treatment Interventions DME instruction;Therapeutic exercise;Gait training;Stair training;Functional mobility training;Therapeutic activities;Patient/family education;Balance training    PT Goals (Current goals can be found in the Care Plan section)  Acute Rehab PT Goals Patient Stated Goal: return home PT Goal Formulation: With patient Time For Goal Achievement: 05/16/20 Potential to Achieve Goals: Good    Frequency Min 5X/week    AM-PAC PT "6 Clicks" Mobility  Outcome Measure Help needed turning from your back to your side while in a flat bed without using bedrails?: A Little Help needed moving from lying on your back to sitting on the side of a flat bed without using bedrails?: A Little Help needed moving to and from a bed to a chair (including a wheelchair)?: A Little Help needed standing up from a chair using your arms (e.g.,  wheelchair or bedside chair)?: A Little Help needed to walk in hospital room?: A Little Help needed climbing 3-5 steps with a railing? : A Lot 6 Click Score: 17    End of Session Equipment Utilized During Treatment: Gait belt;Back brace Activity Tolerance: Patient tolerated treatment well;Patient limited by fatigue Patient left: in bed;with call bell/phone within reach;with family/visitor present Nurse Communication: Mobility status PT Visit Diagnosis: Difficulty in walking, not elsewhere classified (R26.2);Pain Pain - Right/Left: Right Pain - part of body: Hip (back)    Time: 9811-9147 PT Time Calculation (min) (ACUTE ONLY): 28 min   Charges:   PT Evaluation $PT Eval Moderate Complexity: 1 Mod PT Treatments $Gait Training: 8-22 mins        Karma Ganja, PT, DPT   Acute Rehabilitation Department Pager #: [phone]  Otho Bellows 05/02/2020, 6:33 PM

## 2020-05-02 NOTE — Progress Notes (Signed)
Patient transferred to OR from room 3C02 at this time. Alert and in stable condition. Report given to receiving nurse Mendel Ryder, RN with all questions answered. Left unit via bed.

## 2020-05-03 ENCOUNTER — Inpatient Hospital Stay (HOSPITAL_COMMUNITY): Payer: Medicare PPO

## 2020-05-03 DIAGNOSIS — Z981 Arthrodesis status: Secondary | ICD-10-CM

## 2020-05-03 MED ORDER — MAGNESIUM CITRATE PO SOLN: 1.0000 | Freq: Once | ORAL | Status: DC

## 2020-05-03 MED ORDER — MAGNESIUM CITRATE PO SOLN
1.0000 | Freq: Once | ORAL | Status: AC
  Administered 2020-05-03: 1 via ORAL
  Filled 2020-05-03: qty 296

## 2020-05-03 NOTE — Progress Notes (Signed)
Lower extremity venous has been completed.   Preliminary results in CV Proc.   Abram Sander 05/03/2020 11:43 AM

## 2020-05-03 NOTE — Progress Notes (Signed)
Patient ID: Carol Cox, female   DOB: 08-30-1951, 69 y.o.   MRN: [id number]  Progress Note    05/03/2020 10:40 AM 1 Day Post-Op  Subjective: Comfortable this morning.  No nausea or vomiting   Vitals:   05/03/20 0410 05/03/20 0714  BP: (!) 112/59 110/65  Pulse: 84 74  Resp: 20 18  Temp: 98.1 F (36.7 C) 98.2 F (36.8 C)  SpO2: 96% 96%   Physical Exam: Abdomen soft and nontender.  Incision healing.  CBC    Component Value Date/Time   WBC 11.7 (H) 05/02/2020 1906   RBC 3.36 (L) 05/02/2020 1906   HGB 10.6 (L) 05/02/2020 1906   HCT 33.3 (L) 05/02/2020 1906   PLT 162 05/02/2020 1906   MCV 99.1 05/02/2020 1906   MCH 31.5 05/02/2020 1906   MCHC 31.8 05/02/2020 1906   RDW 13.1 05/02/2020 1906   LYMPHSABS 2.4 12/01/2014 0610   MONOABS 0.5 12/01/2014 0610   EOSABS 0.2 12/01/2014 0610   BASOSABS 0.0 12/01/2014 0610    BMET    Component Value Date/Time   NA 138 04/23/2020 1200   NA 142 12/11/2019 1117   K 4.4 04/23/2020 1200   CL 106 04/23/2020 1200   CO2 22 04/23/2020 1200   GLUCOSE 117 (H) 04/23/2020 1200   BUN 25 (H) 04/23/2020 1200   BUN 18 12/11/2019 1117   CREATININE 1.12 (H) 05/02/2020 1906   CALCIUM 10.0 04/23/2020 1200   GFRNONAA 50 (L) 05/02/2020 1906   GFRAA 58 (L) 05/02/2020 1906    INR    Component Value Date/Time   INR 1.0 04/23/2020 1200     Intake/Output Summary (Last 24 hours) at 05/03/2020 1040 Last data filed at 05/03/2020 0500 Gross per 24 hour  Intake 938 ml  Output 300 ml  Net 638 ml     Assessment/Plan:  69 y.o. female stable postop day 2 from anterior exposure and day 1 from posterior repair.  Doing well overall.  Will not follow actively.  Please call if we can assist.     Rosetta Posner, MD [facility] [phone] 05/03/2020 10:40 AM

## 2020-05-03 NOTE — Anesthesia Postprocedure Evaluation (Signed)
Anesthesia Post Note  Patient: Carol Cox  Procedure(s) Performed: POSTERIOR LUMBAR FUSION L4-S1 (N/A )     Patient location during evaluation: PACU Anesthesia Type: General Level of consciousness: awake and alert Pain management: pain level controlled Vital Signs Assessment: post-procedure vital signs reviewed and stable Respiratory status: spontaneous breathing, nonlabored ventilation and respiratory function stable Cardiovascular status: blood pressure returned to baseline and stable Postop Assessment: no apparent nausea or vomiting Anesthetic complications: no   No complications documented.  Last Vitals:  Vitals:   05/03/20 1139 05/03/20 1506  BP: (!) 101/58 (!) 96/45  Pulse: 71 75  Resp: 18 18  Temp: 36.6 C 36.9 C  SpO2: 96% 97%    Last Pain:  Vitals:   05/03/20 1506  TempSrc: Oral  PainSc:                  Audry Pili

## 2020-05-03 NOTE — Progress Notes (Signed)
Physical Therapy Treatment Patient Details Name: Carol Cox MRN: [id number] DOB: Nov 10, 1950 Today's Date: 05/03/2020    History of Present Illness Pt is a 69 yo female presenting s/p posterior pedicle screw fixation L4-S1 on 7/8 following ALIF L4-S1 7/7 due to ongoing LBP that raadiated to LLE and buttocks. PMH includes: HTN, L TKA, CAD, and osteoporosis.    PT Comments    Pt progressing towards physical therapy goals. Was able to perform transfers and ambulation with gross supervision for safety up to min guard assist (occasional), for unsteadiness throughout gait training. Reinforced education on brace application/wearing schedule, precautions, activity progression and car transfer. Will continue to follow and progress as able per POC.     Follow Up Recommendations  No PT follow up;Supervision for mobility/OOB     Equipment Recommendations  None recommended by PT    Recommendations for Other Services       Precautions / Restrictions Precautions Precautions: Back Precaution Booklet Issued: Yes (comment) Precaution Comments: Pt was cued for precautions during functional mobility Required Braces or Orthoses: Spinal Brace Spinal Brace: Lumbar corset;Applied in sitting position Spinal Brace Comments: may ambulate to the bathroom without brace Restrictions Weight Bearing Restrictions: No    Mobility  Bed Mobility Overal bed mobility: Needs Assistance Bed Mobility: Rolling;Sidelying to Sit Rolling: Modified independent (Device/Increase time) Sidelying to sit: Supervision       General bed mobility comments: No assist required. HOB flat and rails lowered to simulate home environment.   Transfers Overall transfer level: Needs assistance Equipment used: None Transfers: Sit to/from Stand Sit to Stand: Supervision         General transfer comment: safe with hand placement for sit<>stand  Ambulation/Gait Ambulation/Gait assistance: Min guard;Supervision Gait  Distance (Feet): 250 Feet Assistive device: None Gait Pattern/deviations: Step-through pattern;Decreased stride length;Trunk flexed Gait velocity: Decreased Gait velocity interpretation: <1.31 ft/sec, indicative of household ambulator General Gait Details: VC's for improved posture and forward gaze. Overall pt moving slow and with guarded posture. Occasional railing use in hall initially but progressed to no UE support and supervision for safety by the end of gait training.    Stairs Stairs: Yes Stairs assistance: Min guard Stair Management: One rail Right;Step to pattern;Forwards Number of Stairs: 6 General stair comments: VC's for sequencing and general safety.    Wheelchair Mobility    Modified Rankin (Stroke Patients Only)       Balance Overall balance assessment: Mild deficits observed, not formally tested Sitting-balance support: Single extremity supported;Feet supported Sitting balance-Leahy Scale: Good     Standing balance support: Single extremity supported;During functional activity Standing balance-Leahy Scale: Fair Standing balance comment: minA to maintain static stance without UE support                            Cognition Arousal/Alertness: Awake/alert Behavior During Therapy: WFL for tasks assessed/performed Overall Cognitive Status: Within Functional Limits for tasks assessed                                        Exercises      General Comments        Pertinent Vitals/Pain Pain Assessment: Faces Faces Pain Scale: Hurts little more Pain Location: Incision site, R hip sore Pain Descriptors / Indicators: Grimacing;Sore;Operative site guarding Pain Intervention(s): Limited activity within patient's tolerance;Monitored during session;Repositioned    Home Living Family/patient  expects to be discharged to:: Private residence   Available Help at Discharge: Family;Available 24 hours/day Type of Home: House Home Access:  Stairs to enter   Home Layout: Two level;Able to live on main level with bedroom/bathroom Home Equipment: Kasandra Knudsen - single point;Walker - 2 wheels;Bedside commode;Shower seat - built in      Prior Function Level of Independence: Independent          PT Goals (current goals can now be found in the care plan section) Acute Rehab PT Goals Patient Stated Goal: home tomorrow PT Goal Formulation: With patient Time For Goal Achievement: 05/16/20 Potential to Achieve Goals: Good Progress towards PT goals: Progressing toward goals    Frequency    Min 5X/week      PT Plan Current plan remains appropriate    Co-evaluation              AM-PAC PT "6 Clicks" Mobility   Outcome Measure  Help needed turning from your back to your side while in a flat bed without using bedrails?: None Help needed moving from lying on your back to sitting on the side of a flat bed without using bedrails?: None Help needed moving to and from a bed to a chair (including a wheelchair)?: None Help needed standing up from a chair using your arms (e.g., wheelchair or bedside chair)?: None Help needed to walk in hospital room?: None Help needed climbing 3-5 steps with a railing? : A Little 6 Click Score: 23    End of Session Equipment Utilized During Treatment: Gait belt;Back brace Activity Tolerance: Patient tolerated treatment well;Patient limited by fatigue Patient left: in bed;with call bell/phone within reach;with family/visitor present Nurse Communication: Mobility status PT Visit Diagnosis: Difficulty in walking, not elsewhere classified (R26.2);Pain Pain - Right/Left: Right Pain - part of body: Hip (back)     Time: 6606-3016 PT Time Calculation (min) (ACUTE ONLY): 21 min  Charges:  $Gait Training: 8-22 mins                     Rolinda Roan, PT, DPT Acute Rehabilitation Services Pager: [phone] Office: [phone]    Thelma Comp 05/03/2020, 10:35 AM

## 2020-05-03 NOTE — Progress Notes (Signed)
    Subjective: Procedure(s) (LRB): POSTERIOR LUMBAR FUSION L4-S1 (N/A) 1 Day Post-Op  Patient reports pain as 2 on 0-10 scale.  Reports decreased leg pain reports incisional back pain   Positive void Negative bowel movement Positive flatus Negative chest pain or shortness of breath  Objective: Vital signs in last 24 hours: Temp:  [98.1 F (36.7 C)-98.9 F (37.2 C)] 98.2 F (36.8 C) (07/09 0714) Pulse Rate:  [74-101] 74 (07/09 0714) Resp:  [9-20] 18 (07/09 0714) BP: (105-116)/(55-65) 110/65 (07/09 0714) SpO2:  [92 %-100 %] 96 % (07/09 0714)  Intake/Output from previous day: 07/08 0701 - 07/09 0700 In: 2038 [P.O.:240; I.V.:1648; IV Piggyback:150] Out: 600 [Urine:500; Blood:100]  Labs: Recent Labs    05/02/20 1906  WBC 11.7*  RBC 3.36*  HCT 33.3*  PLT 162   Recent Labs    05/02/20 1906  CREATININE 1.12*   No results for input(s): LABPT, INR in the last 72 hours.  Physical Exam: Neurologically intact ABD soft Intact pulses distally Incision: dressing C/D/I and no drainage Compartment soft Body mass index is 31.49 kg/m.   Assessment/Plan: Patient stable  xrays n/a Continue mobilization with physical therapy Continue care  Advance diet Up with therapy  Doppler pending for this morning. Continue to work with physical therapy. Overall patient is doing very well.  Plan on discharge to home tomorrow provided she is cleared by therapy.  Melina Schools, MD [facility] [phone]

## 2020-05-03 NOTE — Evaluation (Signed)
Occupational Therapy Evaluation and Discharge Patient Details Name: Carol Cox MRN: [id number] DOB: 11/02/1950 Today's Date: 05/03/2020    History of Present Illness Pt is a 69 yo female presenting s/p posterior pedicle screw fixation L4-S1 on 7/8 following ALIF L4-S1 7/7 due to ongoing LBP that raadiated to LLE and buttocks. PMH includes: HTN, L TKA, CAD, and osteoporosis.   Clinical Impression   This 69 yo female admitted with above presents to acute OT with all education completed, no further OT needs, we will sign off.    Follow Up Recommendations  No OT follow up;Supervision - Intermittent    Equipment Recommendations  None recommended by OT       Precautions / Restrictions Precautions Precautions: Back Precaution Booklet Issued: Yes (comment) Precaution Comments: verbally reviewed parts with patient Required Braces or Orthoses: Spinal Brace Spinal Brace: Lumbar corset;Applied in sitting position Spinal Brace Comments: may ambulate to the bathroom without brace Restrictions Weight Bearing Restrictions: No      Mobility Bed Mobility               General bed mobility comments: Pt up in recliner upon arrival  Transfers Overall transfer level: Needs assistance Equipment used: None Transfers: Sit to/from Stand Sit to Stand: Supervision         General transfer comment: safe with hand placement for sit<>stand        ADL either performed or assessed with clinical judgement   ADL                                         General ADL Comments: Educated pt on sequence of dressing (family will A or pt may get adaptive equipment), may benefit from handheld shower, not bending over sink when doing mouth care, sit<>stand stance that is easier for not bending her back, using wet wipes for back peri care, use of AE for LB ADLs (handout provided)     Vision Patient Visual Report: No change from baseline              Pertinent Vitals/Pain  Pain Assessment: Faces Faces Pain Scale: Hurts a little bit Pain Location: spine/surgical site with sit<>stand from recliner Pain Descriptors / Indicators: Grimacing;Sore Pain Intervention(s): Limited activity within patient's tolerance;Monitored during session;Repositioned     Hand Dominance Right   Extremity/Trunk Assessment Upper Extremity Assessment Upper Extremity Assessment: Overall WFL for tasks assessed           Communication Communication Communication: No difficulties   Cognition Arousal/Alertness: Awake/alert Behavior During Therapy: WFL for tasks assessed/performed Overall Cognitive Status: Within Functional Limits for tasks assessed                                                Home Living Family/patient expects to be discharged to:: Private residence   Available Help at Discharge: Family;Available 24 hours/day Type of Home: House Home Access: Stairs to enter [employer] of Steps: 3 at side door, no rail OR 5 at front door with rail   Home Layout: Two level;Able to live on main level with bedroom/bathroom     Bathroom Shower/Tub: Occupational psychologist: Standard     Home Equipment: Cane - single point;Walker - 2 wheels;Bedside commode;Shower seat - built  in          Prior Functioning/Environment Level of Independence: Independent                 OT Problem List: Decreased range of motion;Pain         OT Goals(Current goals can be found in the care plan section) Acute Rehab OT Goals Patient Stated Goal: home tomorrow  OT Frequency:                AM-PAC OT "6 Clicks" Daily Activity     Outcome Measure Help from another person eating meals?: None Help from another person taking care of personal grooming?: None Help from another person toileting, which includes using toliet, bedpan, or urinal?: None Help from another person bathing (including washing, rinsing, drying)?: A Lot Help from  another person to put on and taking off regular upper body clothing?: A Little Help from another person to put on and taking off regular lower body clothing?: A Lot 6 Click Score: 19   End of Session Equipment Utilized During Treatment: Back brace Nurse Communication:  (no further OT needs)  Activity Tolerance: Patient tolerated treatment well Patient left: in chair;with call bell/phone within reach  OT Visit Diagnosis: Pain;Muscle weakness (generalized) (M62.81) Pain - part of body:  (incisional)                Time: 6378-5885 OT Time Calculation (min): 30 min Charges:  OT General Charges $OT Visit: 1 Visit OT Evaluation $OT Eval Moderate Complexity: 1 Mod OT Treatments $Self Care/Home Management : 8-22 mins  [place], OTR/L Acute Rehab Services Pager [phone] Office [phone]     Almon Register 05/03/2020, 9:45 AM

## 2020-05-04 NOTE — Progress Notes (Signed)
   Subjective: 2 Days Post-Op Procedure(s) (LRB): POSTERIOR LUMBAR FUSION L4-S1 (N/A)  C/o mild pain to lumbar spine but no new symptoms Denies any numbness or tingling distally Therapy has progressed well Pt ready to d/c home Patient reports pain as mild.  Objective:   VITALS:   Vitals:   05/04/20 0431 05/04/20 0450  BP: (!) 98/59 113/62  Pulse: 81 77  Resp: 20   Temp: 98.4 F (36.9 C)   SpO2: 95%     Lumbar incision healing well Dressing intact with no drainage or bleeding nv intact distal bilateral lower extremities No rashes or signs of infection  LABS Recent Labs    05/02/20 1906  HGB 10.6*  HCT 33.3*  WBC 11.7*  PLT 162    Recent Labs    05/02/20 1906  CREATININE 1.12*     Assessment/Plan: 2 Days Post-Op Procedure(s) (LRB): POSTERIOR LUMBAR FUSION L4-S1 (N/A) D/c home today F/u in  2 weeks with Dr. Rolena Infante Pain management as needed     Merla Riches PA-C, [facility] is now [facility] [address] [place], [place], [place] [address] Phone: [phone] [email or web address] Facebook  [employer]

## 2020-05-04 NOTE — Progress Notes (Signed)
Patient is discharged from room 3C02 at this time. Alert and in stable condition. IV site d/c'd and instructions read to patient and spouse with understanding verbalized and all questions answered. Left unit via wheelchair with all belongings at side.  ?

## 2020-05-04 NOTE — Progress Notes (Signed)
Physical Therapy Treatment Patient Details Name: Carol Cox MRN: [id number] DOB: [birth year] Today's Date: 05/04/2020    History of Present Illness Pt is a 69 yo female presenting s/p posterior pedicle screw fixation L4-S1 on 7/8 following ALIF L4-S1 7/7 due to ongoing LBP that raadiated to LLE and buttocks. PMH includes: HTN, L TKA, CAD, and osteoporosis.    PT Comments    Pt continuing to progress well towards achieving her current functional mobility goals. Plan is to d/c home today. She is ready for d/c from PT perspective.   Follow Up Recommendations  No PT follow up;Supervision for mobility/OOB     Equipment Recommendations  None recommended by PT    Recommendations for Other Services       Precautions / Restrictions Precautions Precautions: Back Precaution Comments: reviewed 3/3 back precautions with pt throughout Required Braces or Orthoses: Spinal Brace Spinal Brace: Lumbar corset;Applied in sitting position Restrictions Weight Bearing Restrictions: No    Mobility  Bed Mobility Overal bed mobility: Needs Assistance Bed Mobility: Rolling;Sidelying to Sit;Sit to Sidelying Rolling: Modified independent (Device/Increase time) Sidelying to sit: Supervision     Sit to sidelying: Supervision General bed mobility comments: good utilization of log roll technique  Transfers Overall transfer level: Needs assistance Equipment used: None Transfers: Sit to/from Stand Sit to Stand: Supervision         General transfer comment: safe with hand placement for sit<>stand  Ambulation/Gait Ambulation/Gait assistance: Supervision Gait Distance (Feet): 500 Feet Assistive device: None Gait Pattern/deviations: Step-through pattern;Decreased stride length;Trunk flexed Gait velocity: Decreased   General Gait Details: no instability or LOB, supervision for safety   Stairs             Wheelchair Mobility    Modified Rankin (Stroke Patients Only)        Balance Overall balance assessment: Mild deficits observed, not formally tested                                          Cognition Arousal/Alertness: Awake/alert Behavior During Therapy: WFL for tasks assessed/performed Overall Cognitive Status: Within Functional Limits for tasks assessed                                        Exercises      General Comments        Pertinent Vitals/Pain Pain Assessment: Faces Faces Pain Scale: Hurts little more Pain Location: Incision site, R hip sore Pain Descriptors / Indicators: Grimacing;Sore;Operative site guarding Pain Intervention(s): Monitored during session;Repositioned    Home Living                      Prior Function            PT Goals (current goals can now be found in the care plan section) Acute Rehab PT Goals PT Goal Formulation: With patient Time For Goal Achievement: 05/16/20 Potential to Achieve Goals: Good Progress towards PT goals: Progressing toward goals    Frequency    Min 5X/week      PT Plan Current plan remains appropriate    Co-evaluation              AM-PAC PT "6 Clicks" Mobility   Outcome Measure  Help needed turning from your back to your side while  in a flat bed without using bedrails?: None Help needed moving from lying on your back to sitting on the side of a flat bed without using bedrails?: None Help needed moving to and from a bed to a chair (including a wheelchair)?: None Help needed standing up from a chair using your arms (e.g., wheelchair or bedside chair)?: None Help needed to walk in hospital room?: None Help needed climbing 3-5 steps with a railing? : None 6 Click Score: 24    End of Session Equipment Utilized During Treatment: Back brace Activity Tolerance: Patient tolerated treatment well Patient left: in bed;with call bell/phone within reach Nurse Communication: Mobility status PT Visit Diagnosis: Difficulty in walking,  not elsewhere classified (R26.2);Pain Pain - part of body:  (back)     Time: 5189-8421 PT Time Calculation (min) (ACUTE ONLY): 15 min  Charges:  $Gait Training: 8-22 mins                     Anastasio Champion, DPT  Acute Rehabilitation Services Pager [phone] Office [place] 05/04/2020, 9:33 AM

## 2020-05-04 NOTE — Discharge Summary (Signed)
Orthopedic Discharge Summary        Physician Discharge Summary  Patient ID: Carol Cox MRN: [id number] DOB/AGE: May 06, 1951 69 y.o.  Admit date: 05/01/2020 Discharge date: 05/04/2020   Procedures:  Procedure(s) (LRB): POSTERIOR LUMBAR FUSION L4-S1 (N/A)  Attending Physician:  Dr. Melina Schools  Admission Diagnoses:   Lumbar foraminal stenosis  Discharge Diagnoses:  Lumbar foraminal stenosis   Past Medical History:  Diagnosis Date  . Allergy    seasonal  . Arthritis    "knee, left hip, ankles"   . Borderline glaucoma   . CAD (coronary artery disease)    Mild nonobstructive CAD by cath 02/06/13 (25% mid LAD), normal EF  . Chronic low back pain   . Colitis   . Coronary arteriosclerosis   . DDD (degenerative disc disease), lumbar   . Degeneration of intervertebral disc of lumbar region   . Family history of adverse reaction to anesthesia    mother has problems with nausea and vomiting   . Glaucoma   . History of colitis    02/ 2016  acute infectious colitis-- resolved  . History of total knee arthroplasty   . Hyperglycemia   . Hyperlipidemia   . Left ovarian cyst   . Low back pain    with bilateral hip radiation   . Lumbar radiculopathy   . Lumbar spondylolysis   . Obesity   . Osteoporosis    ospenia of hips  . Pain in joint of left shoulder   . Pain of right hip joint   . PONV (postoperative nausea and vomiting)    SEVERE    PCP: Christain Sacramento, MD   Discharged Condition: good  Hospital Course:  Patient underwent the above stated procedure on 05/01/2020. Patient tolerated the procedure well and brought to the recovery room in good condition and subsequently to the floor. Patient had an uncomplicated hospital course and was stable for discharge.   Disposition: Discharge disposition: 01-Home or Self Care      with follow up in 2 weeks    [name], [name], MD. Schedule an appointment as soon as possible for a visit in 2  weeks.   Specialty: Orthopedic Surgery Why: If symptoms worsen, For suture removal, For wound re-check Contact information: [address] [place] [address] [phone]               Discharge Instructions    Call MD / Call 911   Complete by: As directed    If you experience chest pain or shortness of breath, CALL 911 and be transported to the hospital emergency room.  If you develope a fever above 101 F, pus (white drainage) or increased drainage or redness at the wound, or calf pain, call your surgeon's office.   Constipation Prevention   Complete by: As directed    Drink plenty of fluids.  Prune juice may be helpful.  You may use a stool softener, such as Colace (over the counter) 100 mg twice a day.  Use MiraLax (over the counter) for constipation as needed.   Diet - low sodium heart healthy   Complete by: As directed    Incentive spirometry RT   Complete by: As directed    Incentive spirometry RT   Complete by: As directed    Increase activity slowly as tolerated   Complete by: As directed       Allergies as of 05/04/2020      Reactions  Atorvastatin Other (See Comments)   Leg pain   Rosuvastatin Other (See Comments)   Leg pain      Medication List    STOP taking these medications   CALCIUM 1200+D3 PO   fish oil-omega-3 fatty acids 1000 MG capsule   ibuprofen 600 MG tablet Commonly known as: Advil   Metrogel 1 % gel Generic drug: metroNIDAZOLE   multivitamin tablet   timolol 0.5 % ophthalmic solution Commonly known as: TIMOPTIC   traMADol 50 MG tablet Commonly known as: ULTRAM   Vitamin D-3 25 MCG (1000 UT) Caps     TAKE these medications   aspirin EC 81 MG tablet Take 81 mg by mouth daily.   COQ10 PO Take 1 capsule by mouth daily.   dorzolamide-timolol 22.3-6.8 MG/ML ophthalmic solution Commonly known as: COSOPT Place 1 drop into both eyes in the morning and at bedtime.   enoxaparin 40 MG/0.4ML injection Commonly  known as: LOVENOX Inject 0.4 mLs (40 mg total) into the skin daily for 10 days. 10 day supply 1 injection per day   ezetimibe-simvastatin 10-40 MG tablet Commonly known as: VYTORIN TAKE 1 TABLET BY MOUTH EVERYDAY AT BEDTIME What changed: See the new instructions.   gabapentin 300 MG capsule Commonly known as: NEURONTIN Take 300 mg by mouth 2 (two) times daily.   lisinopril 20 MG tablet Commonly known as: ZESTRIL Take 1 tablet (20 mg total) by mouth daily.   methocarbamol 500 MG tablet Commonly known as: Robaxin Take 1 tablet (500 mg total) by mouth every 8 (eight) hours as needed for up to 5 days for muscle spasms.   ondansetron 4 MG tablet Commonly known as: Zofran Take 1 tablet (4 mg total) by mouth every 8 (eight) hours as needed for nausea or vomiting.   oxyCODONE-acetaminophen 10-325 MG tablet Commonly known as: Percocet Take 1 tablet by mouth every 6 (six) hours as needed for up to 5 days for pain.   psyllium 58.6 % powder Commonly known as: METAMUCIL Take 1 packet by mouth as needed.   Travatan Z 0.004 % Soln ophthalmic solution Generic drug: Travoprost (BAK Free) Place 1 drop into both eyes at bedtime.         Signed: Ventura Bruns 05/04/2020, 6:35 AM  [facility] is now [facility] [address], [place], [place], [place] [address] Phone: [place]

## 2020-05-04 NOTE — Care Management (Signed)
Spoke w patient at bedside. She will get sent home w Lovenox, she states she has been taught how to administer. I confirmed that she will be sent with paper scripts, called [facility] where she plans to have meds filled. They have it in stock and price will be $10.

## 2020-05-06 MED FILL — Heparin Sodium (Porcine) Inj 1000 Unit/ML: INTRAMUSCULAR | Qty: 30 | Status: AC

## 2020-05-06 MED FILL — Sodium Chloride IV Soln 0.9%: INTRAVENOUS | Qty: 1000 | Status: AC

## 2020-06-18 ENCOUNTER — Ambulatory Visit: Payer: Medicare PPO | Attending: Orthopedic Surgery | Admitting: Physical Therapy

## 2020-06-18 ENCOUNTER — Encounter: Payer: Self-pay | Admitting: Physical Therapy

## 2020-06-18 ENCOUNTER — Other Ambulatory Visit: Payer: Self-pay

## 2020-06-18 DIAGNOSIS — M6281 Muscle weakness (generalized): Secondary | ICD-10-CM | POA: Insufficient documentation

## 2020-06-18 DIAGNOSIS — R293 Abnormal posture: Secondary | ICD-10-CM | POA: Insufficient documentation

## 2020-06-18 DIAGNOSIS — M5441 Lumbago with sciatica, right side: Secondary | ICD-10-CM | POA: Diagnosis not present

## 2020-06-18 DIAGNOSIS — M545 Low back pain: Secondary | ICD-10-CM | POA: Insufficient documentation

## 2020-06-18 NOTE — Therapy (Signed)
[facility] [place], [place], [address] Phone: [phone]   Fax:  [phone]  Physical Therapy Evaluation  Patient Details  Name: Carol Cox MRN: [id number] Date of Birth: 06/28/1951 Referring Provider (PT): Melina Schools, MD   Encounter Date: 06/18/2020   PT End of Session - 06/18/20 1707    Visit Number 1    Number of Visits 8    Date for PT Re-Evaluation 07/23/20    Authorization Type [employer] Medicare Authorization; FOTO; Progress note every 10th visit    PT Start Time 1430    PT Stop Time 1516    PT Time Calculation (min) 46 min    Activity Tolerance Patient tolerated treatment well    Behavior During Therapy [facility] for tasks assessed/performed           Past Medical History:  Diagnosis Date  . Allergy    seasonal  . Arthritis    "knee, left hip, ankles"   . Borderline glaucoma   . CAD (coronary artery disease)    Mild nonobstructive CAD by cath 02/06/13 (25% mid LAD), normal EF  . Chronic low back pain   . Colitis   . Coronary arteriosclerosis   . DDD (degenerative disc disease), lumbar   . Degeneration of intervertebral disc of lumbar region   . Family history of adverse reaction to anesthesia    mother has problems with nausea and vomiting   . Glaucoma   . History of colitis    02/ 2016  acute infectious colitis-- resolved  . History of total knee arthroplasty   . Hyperglycemia   . Hyperlipidemia   . Left ovarian cyst   . Low back pain    with bilateral hip radiation   . Lumbar radiculopathy   . Lumbar spondylolysis   . Obesity   . Osteoporosis    ospenia of hips  . Pain in joint of left shoulder   . Pain of right hip joint   . PONV (postoperative nausea and vomiting)    SEVERE    Past Surgical History:  Procedure Laterality Date  . ABDOMINAL EXPOSURE N/A 05/01/2020   Procedure: ABDOMINAL EXPOSURE;  Surgeon: Rosetta Posner, MD;  Location: [place];  Service: Vascular;  Laterality: N/A;  .  ANTERIOR LAT LUMBAR FUSION N/A 05/01/2020   Procedure: ANTERIOR LATERAL LUMBAR FUSION L4-S1;  Surgeon: Melina Schools, MD;  Location: [place];  Service: Orthopedics;  Laterality: N/A;  4.5 hrs Dr. Donnetta Hutching to do approach tap block with exparel  . CARPAL TUNNEL RELEASE Bilateral 1990's  . KNEE ARTHROSCOPY W/ MENISCECTOMY Left 07/2014  . LEFT HEART CATHETERIZATION WITH CORONARY ANGIOGRAM N/A 02/06/2013   Procedure: LEFT HEART CATHETERIZATION WITH CORONARY ANGIOGRAM;  Surgeon: [name] Hampshire, MD;  Location: [place] CATH [facility];  Service: Cardiovascular;  Laterality: N/A;  non-obstruction 25% mLAD,  normal LVF , ef 65-70%  . left knee meniscus surgery     . NASAL SINUS SURGERY    . ovarian cyst     left ovary  . ROTATOR CUFF REPAIR Right 10/2016  . TOTAL KNEE ARTHROPLASTY Left 03/26/2015   Procedure: LEFT TOTAL KNEE ARTHROPLASTY;  Surgeon: Paralee Cancel, MD;  Location: [facility];  Service: Orthopedics;  Laterality: Left;    There were no vitals filed for this visit.    Subjective Assessment - 06/18/20 1704    Subjective COVID-19 screening performed upon arrival.Patient arrives to physical therapy with reports of low back pain, R LE pain and neurological symptoms,  and bilateral LE weakness R>L. Patient is S/P Anterior lumbar interbody fusion of L4-S1 on 05/01/2020 and lumbar posterior spinal fusion instrumentation of L4-S1 on 05/02/2020. Patient is able to perform ADLs but with increased time and pain. Patient gains assistance from husband as needed for household activities. Patient reports pain at worst as 10/10 and pain at best as 2/10. Patient reports utilizing gabapentin and Tylenol arthritis for pain relief. Patient's goals are to decrease pain, improve movement, improve strength, and improve ability to perform ADLs and home activities.    Pertinent History Anterior lateral fusion L4-S1 05/01/2020; Posterior lumbar fusion L4-S1 05/02/2020, HTN, CAD, history of left TKA, glaucoma    Limitations  Sitting;Standing;Walking;House hold activities    Diagnostic tests MRI    Patient Stated Goals decrease pain, improve strength, and improve ADLs and home activities.    Currently in Pain? Yes    Pain Score 5     Pain Location Back    Pain Orientation Lower    Pain Descriptors / Indicators Sore;Aching;Dull    Pain Type Surgical pain    Pain Radiating Towards R leg    Pain Onset More than a month ago    Pain Frequency Constant    Aggravating Factors  no consistency, just starts to hurt    Pain Relieving Factors ice, heat, changing positions.    Effect of Pain on Daily Activities unable to perform all ADLs              [facility] PT Assessment - 06/18/20 0001      Assessment   Medical Diagnosis Post-op ALIF L4-L5, PSFI L4-S1; Encounter for follow up examination after completed treatment for conditions other than malignant neoplasm    Referring Provider (PT) Melina Schools, MD    Onset Date/Surgical Date 05/01/20   ALIF; 05/02/2020 PSFI   Next MD Visit 07/02/2020    Prior Therapy yes      Precautions   Precautions Back    Precaution Comments No bending lifting twisting      Restrictions   Weight Bearing Restrictions No      Balance Screen   Has the patient fallen in the past 6 months No    Has the patient had a decrease in activity level because of a fear of falling?  No    Is the patient reluctant to leave their home because of a fear of falling?  No      Home Environment   Living Environment Private residence    Living Arrangements Spouse/significant other      Prior Function   Level of Independence Independent with basic ADLs;Needs assistance with homemaking      Observation/Other Assessments   Focus on Therapeutic Outcomes (FOTO)  50% limitation      Posture/Postural Control   Posture/Postural Control Postural limitations    Postural Limitations Forward head;Rounded Shoulders;Decreased lumbar lordosis      ROM / Strength   AROM / PROM / Strength AROM;Strength       AROM   Overall AROM Comments AROM of LEs WFL      Strength   Strength Assessment Site Hip;Knee    Right/Left Hip Right;Left    Right Hip Flexion 3/5    Right Hip Extension 3-/5    Right Hip ABduction 3-/5    Left Hip Flexion 3/5    Left Hip Extension 3+/5    Left Hip ABduction 3+/5    Right/Left Knee Right;Left    Right Knee Flexion 3+/5  Right Knee Extension 3/5    Left Knee Flexion 4/5    Left Knee Extension 4+/5      Palpation   Palpation comment slight increase of tone to bilateral lumbar paraspinals and QLs R>L; increased tenderness to R glute region; increased scar tissue to R posterior incision in comparison to left; Increased scar tissue to superior aspect of anterior inscision;      Transfers   Comments cautious transfers with UE support sit<>stand; proper log roll bed mobility      Ambulation/Gait   Gait Pattern Step-through pattern;Decreased step length - right;Decreased step length - left;Decreased arm swing - right;Decreased arm swing - left   heavily guarded in shoulders with shoulder elevation                     Objective measurements completed on examination: See above findings.               PT Education - 06/18/20 1706    Education Details draw ins, supine marching, hip adduction pillow squeeze    Person(s) Educated Patient    Methods Explanation;Demonstration;Handout    Comprehension Returned demonstration;Verbalized understanding            PT Short Term Goals - 06/18/20 1708      PT SHORT TERM GOAL #1   Title STG's=LTG's             PT Long Term Goals - 06/18/20 1708      PT LONG TERM GOAL #1   Title Patient will be independent with HEP.    Time 4    Period Weeks    Status New      PT LONG TERM GOAL #2   Title Patient will report ability to perform ADLs with low back pain and right LE pain less than or equal to 3/10.    Baseline --    Time 4    Period Weeks    Status New      PT LONG TERM GOAL #3    Title Patient will demonstrate 4+/5 or greater bilateral LE MMT to improve stability during functional tasks.    Baseline --    Time 4    Period Weeks    Status New      PT LONG TERM GOAL #4   Title Patient will report a centralization of neurological symptoms to R glute or higher to decrease nerve irritation.    Baseline ---    Time 4    Period Weeks    Status New      PT LONG TERM GOAL #5   Title ---    Baseline ---                  Plan - 06/18/20 1732    Clinical Impression Statement Patient is a 69 year old female who presents to physical therapy with low back pain, decreased bilateral LE MMT R weaker than left, and difficulty walking that began after ALIF L4-S1 and PSFI L4-S1 on 05/01/2020 and 05/02/2020, respectively. Patient noted with increased tone to lumbar paraspinals and QL bilaterally R>L and tenderness to palpation to R QL and glute region. Patient demonstrates proper log roll mobility. Patient and PT discussed plan of care and HEP to maximize PT benefit. Patient reported understanding. Patient would benefit from skilled physical therapy to address deficits and address patient's goals.    Personal Factors and Comorbidities Comorbidity 3+    Comorbidities Anterior lateral fusion L4-S1 05/01/2020; Posterior lumbar  fusion L4-S1 05/02/2020, HTN, CAD, history of left TKA, glaucoma    Examination-Activity Limitations Dressing;Hygiene/Grooming;Stand;Locomotion Level    Examination-Participation Restrictions Cleaning    Stability/Clinical Decision Making Stable/Uncomplicated    Clinical Decision Making Low    Rehab Potential Good    PT Frequency 2x / week    PT Duration 4 weeks    PT Treatment/Interventions ADLs/Self Care Home Management;Cryotherapy;Electrical Stimulation;Moist Heat;Gait training;Stair training;Functional mobility training;Therapeutic activities;Therapeutic exercise;Balance training;Neuromuscular re-education;Manual techniques;Passive range of  motion;Patient/family education    PT Next Visit Plan Nustep, core stabilization and LE strengthening, STW/M to R QL and glute region; modalities PRN for pain relief    PT Home Exercise Plan see patient education section    Consulted and Agree with Plan of Care Patient           Patient will benefit from skilled therapeutic intervention in order to improve the following deficits and impairments:  Decreased balance, Decreased activity tolerance, Decreased range of motion, Difficulty walking, Pain, Decreased strength, Decreased mobility, Postural dysfunction, Decreased scar mobility  Visit Diagnosis: Acute bilateral low back pain with right-sided sciatica - Plan: PT plan of care cert/re-cert  Abnormal posture - Plan: PT plan of care cert/re-cert  Muscle weakness (generalized) - Plan: PT plan of care cert/re-cert     Problem List Patient Active Problem List   Diagnosis Date Noted  . S/P lumbar fusion 05/01/2020  . Hypertension 11/20/2019  . Family history of early CAD 07/15/2017  . Elevated blood pressure reading in office without diagnosis of hypertension 07/15/2017  . Obese 03/27/2015  . S/P left TKA 03/26/2015  . S/P knee replacement 03/26/2015  . Colitis, acute 12/01/2014  . Weight loss, unintentional 12/01/2014  . Adnexal cyst 12/01/2014  . Elevated fasting glucose 12/01/2014  . CAD (coronary artery disease) 12/01/2014  . Osteoarthritis 12/01/2014  . Vasovagal attack 02/28/2013  . Hyperlipidemia 02/28/2013    Gabriela Eves, PT, DPT 06/18/2020, 5:38 PM  [facility] [address] [place], [place], [address] Phone: [phone]   Fax:  [phone]  Name: Carol Cox MRN: [id number] Date of Birth: 01/18/1951

## 2020-06-20 ENCOUNTER — Encounter: Payer: Self-pay | Admitting: Physical Therapy

## 2020-06-20 ENCOUNTER — Other Ambulatory Visit: Payer: Self-pay

## 2020-06-20 ENCOUNTER — Ambulatory Visit: Payer: Medicare PPO | Admitting: *Deleted

## 2020-06-20 DIAGNOSIS — R293 Abnormal posture: Secondary | ICD-10-CM

## 2020-06-20 DIAGNOSIS — M6281 Muscle weakness (generalized): Secondary | ICD-10-CM

## 2020-06-20 DIAGNOSIS — M5441 Lumbago with sciatica, right side: Secondary | ICD-10-CM | POA: Diagnosis not present

## 2020-06-20 DIAGNOSIS — M545 Low back pain, unspecified: Secondary | ICD-10-CM

## 2020-06-20 NOTE — Therapy (Signed)
[facility] [place], [place], [address] Phone: [phone]   Fax:  [phone]  Physical Therapy Treatment  Patient Details  Name: Carol Cox MRN: [id number] Date of Birth: 22-Apr-1951 Referring Provider (PT): Melina Schools, MD   Encounter Date: 06/20/2020   PT End of Session - 06/20/20 1053    Visit Number 2    Number of Visits 8    Date for PT Re-Evaluation 07/23/20    Authorization Type [employer] Medicare Authorization; FOTO; Progress note every 10th visit    PT Start Time 1030    PT Stop Time 1116    PT Time Calculation (min) 46 min    Activity Tolerance Patient tolerated treatment well    Behavior During Therapy [facility] for tasks assessed/performed           Past Medical History:  Diagnosis Date  . Allergy    seasonal  . Arthritis    "knee, left hip, ankles"   . Borderline glaucoma   . CAD (coronary artery disease)    Mild nonobstructive CAD by cath 02/06/13 (25% mid LAD), normal EF  . Chronic low back pain   . Colitis   . Coronary arteriosclerosis   . DDD (degenerative disc disease), lumbar   . Degeneration of intervertebral disc of lumbar region   . Family history of adverse reaction to anesthesia    mother has problems with nausea and vomiting   . Glaucoma   . History of colitis    02/ 2016  acute infectious colitis-- resolved  . History of total knee arthroplasty   . Hyperglycemia   . Hyperlipidemia   . Left ovarian cyst   . Low back pain    with bilateral hip radiation   . Lumbar radiculopathy   . Lumbar spondylolysis   . Obesity   . Osteoporosis    ospenia of hips  . Pain in joint of left shoulder   . Pain of right hip joint   . PONV (postoperative nausea and vomiting)    SEVERE    Past Surgical History:  Procedure Laterality Date  . ABDOMINAL EXPOSURE N/A 05/01/2020   Procedure: ABDOMINAL EXPOSURE;  Surgeon: Rosetta Posner, MD;  Location: [place];  Service: Vascular;  Laterality: N/A;  .  ANTERIOR LAT LUMBAR FUSION N/A 05/01/2020   Procedure: ANTERIOR LATERAL LUMBAR FUSION L4-S1;  Surgeon: Melina Schools, MD;  Location: [place];  Service: Orthopedics;  Laterality: N/A;  4.5 hrs Dr. Donnetta Hutching to do approach tap block with exparel  . CARPAL TUNNEL RELEASE Bilateral 1990's  . KNEE ARTHROSCOPY W/ MENISCECTOMY Left 07/2014  . LEFT HEART CATHETERIZATION WITH CORONARY ANGIOGRAM N/A 02/06/2013   Procedure: LEFT HEART CATHETERIZATION WITH CORONARY ANGIOGRAM;  Surgeon: [name] Hampshire, MD;  Location: [place] C[facility];  Service: Cardiovascular;  Laterality: N/A;  non-obstruction 25% mLAD,  normal LVF , ef 65-70%  . left knee meniscus surgery     . NASAL SINUS SURGERY    . ovarian cyst     left ovary  . ROTATOR CUFF REPAIR Right 10/2016  . TOTAL KNEE ARTHROPLASTY Left 03/26/2015   Procedure: LEFT TOTAL KNEE ARTHROPLASTY;  Surgeon: Paralee Cancel, MD;  Location: [facility];  Service: Orthopedics;  Laterality: Left;    There were no vitals filed for this visit.   Subjective Assessment - 06/20/20 1030    Subjective COVID-19 screening performed upon arrival.Patient arrives to physical therapy with reports of low back pain 2-3/10.and RT LE pain 4-5/10  Pertinent History Anterior lateral fusion L4-S1 05/01/2020; Posterior lumbar fusion L4-S1 05/02/2020, HTN, CAD, history of left TKA, glaucoma    Limitations Sitting;Standing;Walking;House hold activities    Diagnostic tests MRI    Patient Stated Goals decrease pain, improve strength, and improve ADLs and home activities.    Currently in Pain? Yes    Pain Score 4     Pain Location Back    Pain Orientation Lower    Pain Descriptors / Indicators Sore;Aching    Pain Type Surgical pain    Pain Onset More than a month ago                             [facility] Adult PT Treatment/Exercise - 06/20/20 0001      Exercises   Exercises Lumbar;Knee/Hip      Lumbar Exercises: Aerobic   Nustep L3 x 15 mins with focus on posture      Lumbar Exercises:  Standing   Shoulder Extension Strengthening;20 reps;Theraband   2x10   Theraband Level (Shoulder Extension) --   Blue XTS      Lumbar Exercises: Supine   Ab Set 5 seconds;10 reps    Clam 10 reps;5 seconds    Bent Knee Raise 3 seconds;20 reps   2x10   Other Supine Lumbar Exercises ball squeeze x 10 hold 5 secs      Knee/Hip Exercises: Standing   Rocker Board 3 minutes   DF/PF  calf stretching                   PT Short Term Goals - 06/18/20 1708      PT SHORT TERM GOAL #1   Title STG's=LTG's             PT Long Term Goals - 06/18/20 1708      PT LONG TERM GOAL #1   Title Patient will be independent with HEP.    Time 4    Period Weeks    Status New      PT LONG TERM GOAL #2   Title Patient will report ability to perform ADLs with low back pain and right LE pain less than or equal to 3/10.    Baseline --    Time 4    Period Weeks    Status New      PT LONG TERM GOAL #3   Title Patient will demonstrate 4+/5 or greater bilateral LE MMT to improve stability during functional tasks.    Baseline --    Time 4    Period Weeks    Status New      PT LONG TERM GOAL #4   Title Patient will report a centralization of neurological symptoms to R glute or higher to decrease nerve irritation.    Baseline ---    Time 4    Period Weeks    Status New      PT LONG TERM GOAL #5   Title ---    Baseline ---                 Plan - 06/20/20 1115    Clinical Impression Statement Pt arrived today doing fair with 4-5/10  RT LE pain, LBP 2-3/10. Rx focused on core activation exs seated, supine as well as in standing. Pt reports mainly LE fatigue and LB soreness after Rx.    Personal Factors and Comorbidities Comorbidity 3+    Comorbidities Anterior lateral fusion  L4-S1 05/01/2020; Posterior lumbar fusion L4-S1 05/02/2020, HTN, CAD, history of left TKA, glaucoma    Examination-Activity Limitations Dressing;Hygiene/Grooming;Stand;Locomotion Level    Stability/Clinical  Decision Making Stable/Uncomplicated    PT Frequency 2x / week    PT Duration 4 weeks    PT Treatment/Interventions ADLs/Self Care Home Management;Cryotherapy;Electrical Stimulation;Moist Heat;Gait training;Stair training;Functional mobility training;Therapeutic activities;Therapeutic exercise;Balance training;Neuromuscular re-education;Manual techniques;Passive range of motion;Patient/family education    PT Next Visit Plan Nustep, core stabilization and LE strengthening, STW/M to R QL and glute region; modalities PRN for pain relief    PT Home Exercise Plan see patient education section    Consulted and Agree with Plan of Care Patient           Patient will benefit from skilled therapeutic intervention in order to improve the following deficits and impairments:  Decreased balance, Decreased activity tolerance, Decreased range of motion, Difficulty walking, Pain, Decreased strength, Decreased mobility, Postural dysfunction, Decreased scar mobility  Visit Diagnosis: Acute bilateral low back pain with right-sided sciatica  Abnormal posture  Muscle weakness (generalized)  Acute bilateral low back pain without sciatica     Problem List Patient Active Problem List   Diagnosis Date Noted  . S/P lumbar fusion 05/01/2020  . Hypertension 11/20/2019  . Family history of early CAD 07/15/2017  . Elevated blood pressure reading in office without diagnosis of hypertension 07/15/2017  . Obese 03/27/2015  . S/P left TKA 03/26/2015  . S/P knee replacement 03/26/2015  . Colitis, acute 12/01/2014  . Weight loss, unintentional 12/01/2014  . Adnexal cyst 12/01/2014  . Elevated fasting glucose 12/01/2014  . CAD (coronary artery disease) 12/01/2014  . Osteoarthritis 12/01/2014  . Vasovagal attack 02/28/2013  . Hyperlipidemia 02/28/2013    Rennie Hack,CHRIS, PTA 06/20/2020, 11:37 AM  [facility] [address] [place], [place], [address] Phone:  [phone]   Fax:  [phone]  Name: Carol Cox MRN: [id number] Date of Birth: 06/11/1951

## 2020-06-25 ENCOUNTER — Other Ambulatory Visit: Payer: Self-pay

## 2020-06-25 ENCOUNTER — Ambulatory Visit: Payer: Medicare PPO | Admitting: Physical Therapy

## 2020-06-25 ENCOUNTER — Encounter: Payer: Self-pay | Admitting: Physical Therapy

## 2020-06-25 DIAGNOSIS — M5441 Lumbago with sciatica, right side: Secondary | ICD-10-CM

## 2020-06-25 DIAGNOSIS — R293 Abnormal posture: Secondary | ICD-10-CM

## 2020-06-25 DIAGNOSIS — M6281 Muscle weakness (generalized): Secondary | ICD-10-CM

## 2020-06-25 NOTE — Therapy (Signed)
[facility] [place], [place], [address] Phone: [phone]   Fax:  [phone]  Physical Therapy Treatment  Patient Details  Name: Carol Cox MRN: [id number] Date of Birth: Feb 29, 1951 Referring Provider (PT): Melina Schools, MD   Encounter Date: 06/25/2020   PT End of Session - 06/25/20 1032    Visit Number 3    Number of Visits 8    Date for PT Re-Evaluation 07/23/20    Authorization Type [employer] Authorization; FOTO; Progress note every 10th visit    PT Start Time 1030    PT Stop Time 1120    PT Time Calculation (min) 50 min    Activity Tolerance Patient tolerated treatment well    Behavior During Therapy [facility] for tasks assessed/performed           Past Medical History:  Diagnosis Date  . Allergy    seasonal  . Arthritis    "knee, left hip, ankles"   . Borderline glaucoma   . CAD (coronary artery disease)    Mild nonobstructive CAD by cath 02/06/13 (25% mid LAD), normal EF  . Chronic low back pain   . Colitis   . Coronary arteriosclerosis   . DDD (degenerative disc disease), lumbar   . Degeneration of intervertebral disc of lumbar region   . Family history of adverse reaction to anesthesia    mother has problems with nausea and vomiting   . Glaucoma   . History of colitis    02/ 2016  acute infectious colitis-- resolved  . History of total knee arthroplasty   . Hyperglycemia   . Hyperlipidemia   . Left ovarian cyst   . Low back pain    with bilateral hip radiation   . Lumbar radiculopathy   . Lumbar spondylolysis   . Obesity   . Osteoporosis    ospenia of hips  . Pain in joint of left shoulder   . Pain of right hip joint   . PONV (postoperative nausea and vomiting)    SEVERE    Past Surgical History:  Procedure Laterality Date  . ABDOMINAL EXPOSURE N/A 05/01/2020   Procedure: ABDOMINAL EXPOSURE;  Surgeon: Rosetta Posner, MD;  Location: [place];  Service: Vascular;  Laterality: N/A;  .  ANTERIOR LAT LUMBAR FUSION N/A 05/01/2020   Procedure: ANTERIOR LATERAL LUMBAR FUSION L4-S1;  Surgeon: Melina Schools, MD;  Location: [place];  Service: Orthopedics;  Laterality: N/A;  4.5 hrs Dr. Donnetta Hutching to do approach tap block with exparel  . CARPAL TUNNEL RELEASE Bilateral 1990's  . KNEE ARTHROSCOPY W/ MENISCECTOMY Left 07/2014  . LEFT HEART CATHETERIZATION WITH CORONARY ANGIOGRAM N/A 02/06/2013   Procedure: LEFT HEART CATHETERIZATION WITH CORONARY ANGIOGRAM;  Surgeon: [name] Hampshire, MD;  Location: [place] CATH [facility];  Service: Cardiovascular;  Laterality: N/A;  non-obstruction 25% mLAD,  normal LVF , ef 65-70%  . left knee meniscus surgery     . NASAL SINUS SURGERY    . ovarian cyst     left ovary  . ROTATOR CUFF REPAIR Right 10/2016  . TOTAL KNEE ARTHROPLASTY Left 03/26/2015   Procedure: LEFT TOTAL KNEE ARTHROPLASTY;  Surgeon: Paralee Cancel, MD;  Location: [facility];  Service: Orthopedics;  Laterality: Left;    There were no vitals filed for this visit.   Subjective Assessment - 06/25/20 1032    Subjective COVID-19 screening performed upon arrival. Reports feeling about the same since last visit.    Pertinent History Anterior lateral fusion L4-S1  05/01/2020; Posterior lumbar fusion L4-S1 05/02/2020, HTN, CAD, history of left TKA, glaucoma    Limitations Sitting;Standing;Walking;House hold activities    Diagnostic tests MRI    Patient Stated Goals decrease pain, improve strength, and improve ADLs and home activities.    Currently in Pain? Yes    Pain Score 4     Pain Location Back    Pain Orientation Lower    Pain Descriptors / Indicators Discomfort    Pain Type Surgical pain    Pain Radiating Towards RLE (6/10)    Pain Onset More than a month ago    Pain Frequency Constant              [facility] PT Assessment - 06/25/20 0001      Assessment   Medical Diagnosis Post-op ALIF L4-L5, PSFI L4-S1; Encounter for follow up examination after completed treatment for conditions other than malignant  neoplasm    Referring Provider (PT) Melina Schools, MD    Onset Date/Surgical Date 05/01/20    Next MD Visit 07/02/2020    Prior Therapy yes      Precautions   Precautions Back    Precaution Comments No bending lifting twisting      Restrictions   Weight Bearing Restrictions No                         [facility] Adult PT Treatment/Exercise - 06/25/20 0001      Lumbar Exercises: Aerobic   Nustep L3 x 13 mins with focus on posture      Lumbar Exercises: Standing   Shoulder Extension Strengthening;Both;20 reps;Limitations    Shoulder Extension Limitations GREEN XTS      Lumbar Exercises: Supine   Ab Set 10 reps;5 seconds    Glut Set 10 reps;5 seconds    Clam 15 reps;5 seconds    Bent Knee Raise 15 reps;2 seconds    Other Supine Lumbar Exercises ball squeeze x 15 hold 5 secs      Knee/Hip Exercises: Standing   Rocker Board 3 minutes      Modalities   Modalities Electrical Stimulation      Electrical Stimulation   Electrical Stimulation Location B low back    Electrical Stimulation Action Pre-Mod    Electrical Stimulation Parameters 80-150 hz x10 min    Electrical Stimulation Goals Pain                    PT Short Term Goals - 06/18/20 1708      PT SHORT TERM GOAL #1   Title STG's=LTG's             PT Long Term Goals - 06/18/20 1708      PT LONG TERM GOAL #1   Title Patient will be independent with HEP.    Time 4    Period Weeks    Status New      PT LONG TERM GOAL #2   Title Patient will report ability to perform ADLs with low back pain and right LE pain less than or equal to 3/10.    Baseline --    Time 4    Period Weeks    Status New      PT LONG TERM GOAL #3   Title Patient will demonstrate 4+/5 or greater bilateral LE MMT to improve stability during functional tasks.    Baseline --    Time 4    Period Weeks    Status New  PT LONG TERM GOAL #4   Title Patient will report a centralization of neurological symptoms to R  glute or higher to decrease nerve irritation.    Baseline ---    Time 4    Period Weeks    Status New      PT LONG TERM GOAL #5   Title ---    Baseline ---                 Plan - 06/25/20 1148    Clinical Impression Statement Patient presented in clinic with reports of continued LBP and RLE radicular pain. Patient guided through low level core strengthening with no complaints of pain. Patient did report LE fatigue after activity and treatment. Normal stimulation response noted following removal of the modality.    Personal Factors and Comorbidities Comorbidity 3+    Comorbidities Anterior lateral fusion L4-S1 05/01/2020; Posterior lumbar fusion L4-S1 05/02/2020, HTN, CAD, history of left TKA, glaucoma    Examination-Activity Limitations Dressing;Hygiene/Grooming;Stand;Locomotion Level    Examination-Participation Restrictions Cleaning    Stability/Clinical Decision Making Stable/Uncomplicated    Rehab Potential Good    PT Frequency 2x / week    PT Duration 4 weeks    PT Treatment/Interventions ADLs/Self Care Home Management;Cryotherapy;Electrical Stimulation;Moist Heat;Gait training;Stair training;Functional mobility training;Therapeutic activities;Therapeutic exercise;Balance training;Neuromuscular re-education;Manual techniques;Passive range of motion;Patient/family education    PT Next Visit Plan Nustep, core stabilization and LE strengthening, STW/M to R QL and glute region; modalities PRN for pain relief    PT Home Exercise Plan see patient education section    Consulted and Agree with Plan of Care Patient           Patient will benefit from skilled therapeutic intervention in order to improve the following deficits and impairments:  Decreased balance, Decreased activity tolerance, Decreased range of motion, Difficulty walking, Pain, Decreased strength, Decreased mobility, Postural dysfunction, Decreased scar mobility  Visit Diagnosis: Acute bilateral low back pain with  right-sided sciatica  Abnormal posture  Muscle weakness (generalized)     Problem List Patient Active Problem List   Diagnosis Date Noted  . S/P lumbar fusion 05/01/2020  . Hypertension 11/20/2019  . Family history of early CAD 07/15/2017  . Elevated blood pressure reading in office without diagnosis of hypertension 07/15/2017  . Obese 03/27/2015  . S/P left TKA 03/26/2015  . S/P knee replacement 03/26/2015  . Colitis, acute 12/01/2014  . Weight loss, unintentional 12/01/2014  . Adnexal cyst 12/01/2014  . Elevated fasting glucose 12/01/2014  . CAD (coronary artery disease) 12/01/2014  . Osteoarthritis 12/01/2014  . Vasovagal attack 02/28/2013  . Hyperlipidemia 02/28/2013    Standley Brooking, PTA 06/25/2020, 11:55 AM  [facility] [address] [place], [place], [address] Phone: [phone]   Fax:  [phone]  Name: Carol Cox MRN: [id number] Date of Birth: 1951-08-29

## 2020-06-27 ENCOUNTER — Other Ambulatory Visit: Payer: Self-pay

## 2020-06-27 ENCOUNTER — Ambulatory Visit: Payer: Medicare PPO | Attending: Orthopedic Surgery | Admitting: Physical Therapy

## 2020-06-27 DIAGNOSIS — R293 Abnormal posture: Secondary | ICD-10-CM | POA: Diagnosis present

## 2020-06-27 DIAGNOSIS — M6281 Muscle weakness (generalized): Secondary | ICD-10-CM | POA: Insufficient documentation

## 2020-06-27 DIAGNOSIS — M5441 Lumbago with sciatica, right side: Secondary | ICD-10-CM | POA: Insufficient documentation

## 2020-06-27 NOTE — Therapy (Signed)
[facility] [place], [place], [address] Phone: [phone]   Fax:  [phone]  Physical Therapy Treatment  Patient Details  Name: Carol Cox MRN: [id number] Date of Birth: [birth year] Referring Provider (PT): Melina Schools, MD   Encounter Date: 06/27/2020   PT End of Session - 06/27/20 1311    Visit Number 4    Number of Visits 8    Date for PT Re-Evaluation 07/23/20    Authorization Type [employer] Medicare Authorization; FOTO; Progress note every 10th visit    PT Start Time 1300    PT Stop Time 1345    PT Time Calculation (min) 45 min    Activity Tolerance Patient tolerated treatment well    Behavior During Therapy [facility] for tasks assessed/performed           Past Medical History:  Diagnosis Date  . Allergy    seasonal  . Arthritis    "knee, left hip, ankles"   . Borderline glaucoma   . CAD (coronary artery disease)    Mild nonobstructive CAD by cath 02/06/13 (25% mid LAD), normal EF  . Chronic low back pain   . Colitis   . Coronary arteriosclerosis   . DDD (degenerative disc disease), lumbar   . Degeneration of intervertebral disc of lumbar region   . Family history of adverse reaction to anesthesia    mother has problems with nausea and vomiting   . Glaucoma   . History of colitis    02/ 2016  acute infectious colitis-- resolved  . History of total knee arthroplasty   . Hyperglycemia   . Hyperlipidemia   . Left ovarian cyst   . Low back pain    with bilateral hip radiation   . Lumbar radiculopathy   . Lumbar spondylolysis   . Obesity   . Osteoporosis    ospenia of hips  . Pain in joint of left shoulder   . Pain of right hip joint   . PONV (postoperative nausea and vomiting)    SEVERE    Past Surgical History:  Procedure Laterality Date  . ABDOMINAL EXPOSURE N/A 05/01/2020   Procedure: ABDOMINAL EXPOSURE;  Surgeon: Rosetta Posner, MD;  Location: [place];  Service: Vascular;  Laterality: N/A;  .  ANTERIOR LAT LUMBAR FUSION N/A 05/01/2020   Procedure: ANTERIOR LATERAL LUMBAR FUSION L4-S1;  Surgeon: Melina Schools, MD;  Location: [place];  Service: Orthopedics;  Laterality: N/A;  4.5 hrs Dr. Donnetta Hutching to do approach tap block with exparel  . CARPAL TUNNEL RELEASE Bilateral 1990's  . KNEE ARTHROSCOPY W/ MENISCECTOMY Left 07/2014  . LEFT HEART CATHETERIZATION WITH CORONARY ANGIOGRAM N/A 02/06/2013   Procedure: LEFT HEART CATHETERIZATION WITH CORONARY ANGIOGRAM;  Surgeon: [name] Hampshire, MD;  Location: [place] [facility];  Service: Cardiovascular;  Laterality: N/A;  non-obstruction 25% mLAD,  normal LVF , ef 65-70%  . left knee meniscus surgery     . NASAL SINUS SURGERY    . ovarian cyst     left ovary  . ROTATOR CUFF REPAIR Right 10/2016  . TOTAL KNEE ARTHROPLASTY Left 03/26/2015   Procedure: LEFT TOTAL KNEE ARTHROPLASTY;  Surgeon: Paralee Cancel, MD;  Location[facility];  Service: Orthopedics;  Laterality: Left;    There were no vitals filed for this visit.   Subjective Assessment - 06/27/20 1304    Subjective COVID-19 screening performed upon arrival. Pt reporting 6/10 in low back and 5/10 pain in bilateral legs. Pt reporting driving to [facility]  this morning which she believes made it worse. Pt still reporting difficulty sleeping.    Pertinent History Anterior lateral fusion L4-S1 05/01/2020; Posterior lumbar fusion L4-S1 05/02/2020, HTN, CAD, history of left TKA, glaucoma    Limitations Sitting;Standing;Walking;House hold activities    Diagnostic tests MRI    Patient Stated Goals decrease pain, improve strength, and improve ADLs and home activities.    Currently in Pain? Yes    Pain Score 6     Pain Location Back    Pain Orientation Lower    Pain Descriptors / Indicators Aching;Sore    Pain Type Surgical pain    Pain Radiating Towards LE's    Pain Onset More than a month ago                             [facility] Adult PT Treatment/Exercise - 06/27/20 0001      Lumbar Exercises:  Aerobic   Nustep L3 x 10 mins with focus on posture      Lumbar Exercises: Supine   Ab Set 10 reps;5 seconds    Glut Set 10 reps;5 seconds    Clam 15 reps;5 seconds    Bent Knee Raise 15 reps;2 seconds    Other Supine Lumbar Exercises ball squeeze x 15 hold 5 secs      Lumbar Exercises: Sidelying   Clam Both;15 reps;3 seconds    Clam Limitations reverse clams x 15      Knee/Hip Exercises: Standing   Rocker Board 3 minutes      Modalities   Modalities Electrical Stimulation      Electrical Stimulation   Electrical Stimulation Location B low back    Electrical Stimulation Action Pre-mod    Electrical Stimulation Parameters 80-150 HZ x 10    Electrical Stimulation Goals Pain                  PT Education - 06/27/20 1308    Education Details Discussed changing positions frequently    Person(s) Educated Patient    Methods Explanation    Comprehension Verbalized understanding            PT Short Term Goals - 06/18/20 1708      PT SHORT TERM GOAL #1   Title STG's=LTG's             PT Long Term Goals - 06/27/20 1324      PT LONG TERM GOAL #1   Title Patient will be independent with HEP.    Time 4    Period Weeks    Status On-going      PT LONG TERM GOAL #4   Title Patient will report a centralization of neurological symptoms to R glute or higher to decrease nerve irritation.    Status On-going                 Plan - 06/27/20 1320    Clinical Impression Statement Pt presenting today reporting low back pain of 6/10 and bilateral LE pain. Pt tolerating exercises well. Pt needed instructions for exercise technique with tactile cues. Pt still reporting LE fatigue during session. Continue skilled PT to progress toward LTG's.    Personal Factors and Comorbidities Comorbidity 3+    Comorbidities Anterior lateral fusion L4-S1 05/01/2020; Posterior lumbar fusion L4-S1 05/02/2020, HTN, CAD, history of left TKA, glaucoma    Examination-Activity Limitations  Dressing;Hygiene/Grooming;Stand;Locomotion Level    Examination-Participation Restrictions Cleaning    Stability/Clinical Decision  Making Stable/Uncomplicated    Rehab Potential Good    PT Frequency 2x / week    PT Duration 4 weeks    PT Treatment/Interventions ADLs/Self Care Home Management;Cryotherapy;Electrical Stimulation;Moist Heat;Gait training;Stair training;Functional mobility training;Therapeutic activities;Therapeutic exercise;Balance training;Neuromuscular re-education;Manual techniques;Passive range of motion;Patient/family education    PT Next Visit Plan Nustep, core stabilization and LE strengthening, STW/M to R QL and glute region; modalities PRN for pain relief    PT Home Exercise Plan HEP    Consulted and Agree with Plan of Care Patient           Patient will benefit from skilled therapeutic intervention in order to improve the following deficits and impairments:  Decreased balance, Decreased activity tolerance, Decreased range of motion, Difficulty walking, Pain, Decreased strength, Decreased mobility, Postural dysfunction, Decreased scar mobility  Visit Diagnosis: Acute bilateral low back pain with right-sided sciatica  Abnormal posture  Muscle weakness (generalized)     Problem List Patient Active Problem List   Diagnosis Date Noted  . S/P lumbar fusion 05/01/2020  . Hypertension 11/20/2019  . Family history of early CAD 07/15/2017  . Elevated blood pressure reading in office without diagnosis of hypertension 07/15/2017  . Obese 03/27/2015  . S/P left TKA 03/26/2015  . S/P knee replacement 03/26/2015  . Colitis, acute 12/01/2014  . Weight loss, unintentional 12/01/2014  . Adnexal cyst 12/01/2014  . Elevated fasting glucose 12/01/2014  . CAD (coronary artery disease) 12/01/2014  . Osteoarthritis 12/01/2014  . Vasovagal attack 02/28/2013  . Hyperlipidemia 02/28/2013    Oretha Caprice, PT, MPT  06/27/2020, 1:45 PM  [facility] [place], [place], [address] Phone: [phone]   Fax:  [phone]  Name: Carol Cox MRN: [id number] Date of Birth: [birth year]

## 2020-07-02 ENCOUNTER — Ambulatory Visit: Payer: Medicare PPO | Admitting: Physical Therapy

## 2020-07-02 ENCOUNTER — Other Ambulatory Visit: Payer: Self-pay

## 2020-07-02 ENCOUNTER — Encounter: Payer: Self-pay | Admitting: Physical Therapy

## 2020-07-02 DIAGNOSIS — R293 Abnormal posture: Secondary | ICD-10-CM

## 2020-07-02 DIAGNOSIS — M6281 Muscle weakness (generalized): Secondary | ICD-10-CM

## 2020-07-02 DIAGNOSIS — M5441 Lumbago with sciatica, right side: Secondary | ICD-10-CM | POA: Diagnosis not present

## 2020-07-02 NOTE — Therapy (Signed)
[facility] [place], [place], [address] Phone: [phone]   Fax:  [phone]  Physical Therapy Treatment  Patient Details  Name: Carol Cox MRN: [id number] Date of Birth: 19-Jun-1951 Referring Provider (PT): Melina Schools, MD   Encounter Date: 07/02/2020   PT End of Session - 07/02/20 1039    Visit Number 5    Number of Visits 8    Date for PT Re-Evaluation 07/23/20    Authorization Type [employer] Medicare Authorization; FOTO; Progress note every 10th visit    PT Start Time 1031    PT Stop Time 1115    PT Time Calculation (min) 44 min    Activity Tolerance Patient tolerated treatment well    Behavior During Therapy [facility] for tasks assessed/performed           Past Medical History:  Diagnosis Date  . Allergy    seasonal  . Arthritis    "knee, left hip, ankles"   . Borderline glaucoma   . CAD (coronary artery disease)    Mild nonobstructive CAD by cath 02/06/13 (25% mid LAD), normal EF  . Chronic low back pain   . Colitis   . Coronary arteriosclerosis   . DDD (degenerative disc disease), lumbar   . Degeneration of intervertebral disc of lumbar region   . Family history of adverse reaction to anesthesia    mother has problems with nausea and vomiting   . Glaucoma   . History of colitis    02/ 2016  acute infectious colitis-- resolved  . History of total knee arthroplasty   . Hyperglycemia   . Hyperlipidemia   . Left ovarian cyst   . Low back pain    with bilateral hip radiation   . Lumbar radiculopathy   . Lumbar spondylolysis   . Obesity   . Osteoporosis    ospenia of hips  . Pain in joint of left shoulder   . Pain of right hip joint   . PONV (postoperative nausea and vomiting)    SEVERE    Past Surgical History:  Procedure Laterality Date  . ABDOMINAL EXPOSURE N/A 05/01/2020   Procedure: ABDOMINAL EXPOSURE;  Surgeon: Rosetta Posner, MD;  Location: [facility];  Service: Vascular;  Laterality: N/A;  .  ANTERIOR LAT LUMBAR FUSION N/A 05/01/2020   Procedure: ANTERIOR LATERAL LUMBAR FUSION L4-S1;  Surgeon: Melina Schools, MD;  Location: [facility];  Service: Orthopedics;  Laterality: N/A;  4.5 hrs Dr. Donnetta Hutching to do approach tap block with exparel  . CARPAL TUNNEL RELEASE Bilateral 1990's  . KNEE ARTHROSCOPY W/ MENISCECTOMY Left 07/2014  . LEFT HEART CATHETERIZATION WITH CORONARY ANGIOGRAM N/A 02/06/2013   Procedure: LEFT HEART CATHETERIZATION WITH CORONARY ANGIOGRAM;  Surgeon: [name] Hampshire, MD;  Location: [facility];  Service: Cardiovascular;  Laterality: N/A;  non-obstruction 25% mLAD,  normal LVF , ef 65-70%  . left knee meniscus surgery     . NASAL SINUS SURGERY    . ovarian cyst     left ovary  . ROTATOR CUFF REPAIR Right 10/2016  . TOTAL KNEE ARTHROPLASTY Left 03/26/2015   Procedure: LEFT TOTAL KNEE ARTHROPLASTY;  Surgeon: Paralee Cancel, MD;  Location: [facility];  Service: Orthopedics;  Laterality: Left;    There were no vitals filed for this visit.   Subjective Assessment - 07/02/20 1037    Subjective COVID 19 screening performed on patient upon arrival. Patient reports she didn't have a lot of pain the weekend. Reports she was  approved for steroid pack and only has one more day of it. Still having a lot of pain at night.    Pertinent History Anterior lateral fusion L4-S1 05/01/2020; Posterior lumbar fusion L4-S1 05/02/2020, HTN, CAD, history of left TKA, glaucoma    Limitations Sitting;Standing;Walking;House hold activities    Diagnostic tests MRI    Patient Stated Goals decrease pain, improve strength, and improve ADLs and home activities.    Currently in Pain? Yes    Pain Score 3     Pain Location Back    Pain Orientation Lower    Pain Descriptors / Indicators Discomfort    Pain Type Surgical pain    Pain Radiating Towards LE    Pain Onset More than a month ago              [facility] PT Assessment - 07/02/20 0001      Assessment   Medical Diagnosis Post-op ALIF L4-L5, PSFI L4-S1;  Encounter for follow up examination after completed treatment for conditions other than malignant neoplasm    Referring Provider (PT) Melina Schools, MD    Onset Date/Surgical Date 05/01/20    Next MD Visit 07/02/2020    Prior Therapy yes      Precautions   Precautions Back    Precaution Comments No bending lifting twisting      Restrictions   Weight Bearing Restrictions No                         [facility] Adult PT Treatment/Exercise - 07/02/20 0001      Lumbar Exercises: Aerobic   Nustep L5 x10 min      Lumbar Exercises: Supine   Ab Set 15 reps;5 seconds    Glut Set 5 seconds;20 reps    Clam 5 seconds;20 reps    Bent Knee Raise 2 seconds;20 reps    Other Supine Lumbar Exercises ball squeeze x 20 hold 5 secs      Modalities   Modalities --                    PT Short Term Goals - 06/18/20 1708      PT SHORT TERM GOAL #1   Title STG's=LTG's             PT Long Term Goals - 06/27/20 1324      PT LONG TERM GOAL #1   Title Patient will be independent with HEP.    Time 4    Period Weeks    Status On-going      PT LONG TERM GOAL #4   Title Patient will report a centralization of neurological symptoms to R glute or higher to decrease nerve irritation.    Status On-going                 Plan - 07/02/20 1120    Clinical Impression Statement Patient presented in clinic with reports of less LBP currently but still having more pain at night. Patient currently taking steroid pack after being approved. Able to ambulate her stairs reciprically and reporting more strength in RLE. Patient required intermittant correction for supine core strengthening exercises due to technique questions. Patient had no reports of increased pain during therex. Observed completing transition movements at a faster pace and no reports of pain.    Personal Factors and Comorbidities Comorbidity 3+    Comorbidities Anterior lateral fusion L4-S1 05/01/2020; Posterior lumbar  fusion L4-S1 05/02/2020, HTN, CAD, history of  left TKA, glaucoma    Examination-Activity Limitations Dressing;Hygiene/Grooming;Stand;Locomotion Level    Examination-Participation Restrictions Cleaning    Stability/Clinical Decision Making Stable/Uncomplicated    Rehab Potential Good    PT Frequency 2x / week    PT Duration 4 weeks    PT Treatment/Interventions ADLs/Self Care Home Management;Cryotherapy;Electrical Stimulation;Moist Heat;Gait training;Stair training;Functional mobility training;Therapeutic activities;Therapeutic exercise;Balance training;Neuromuscular re-education;Manual techniques;Passive range of motion;Patient/family education    PT Next Visit Plan Nustep, core stabilization and LE strengthening, STW/M to R QL and glute region; modalities PRN for pain relief    PT Home Exercise Plan HEP    Consulted and Agree with Plan of Care Patient           Patient will benefit from skilled therapeutic intervention in order to improve the following deficits and impairments:  Decreased balance, Decreased activity tolerance, Decreased range of motion, Difficulty walking, Pain, Decreased strength, Decreased mobility, Postural dysfunction, Decreased scar mobility  Visit Diagnosis: Acute bilateral low back pain with right-sided sciatica  Abnormal posture  Muscle weakness (generalized)     Problem List Patient Active Problem List   Diagnosis Date Noted  . S/P lumbar fusion 05/01/2020  . Hypertension 11/20/2019  . Family history of early CAD 07/15/2017  . Elevated blood pressure reading in office without diagnosis of hypertension 07/15/2017  . Obese 03/27/2015  . S/P left TKA 03/26/2015  . S/P knee replacement 03/26/2015  . Colitis, acute 12/01/2014  . Weight loss, unintentional 12/01/2014  . Adnexal cyst 12/01/2014  . Elevated fasting glucose 12/01/2014  . CAD (coronary artery disease) 12/01/2014  . Osteoarthritis 12/01/2014  . Vasovagal attack 02/28/2013  . Hyperlipidemia  02/28/2013    Standley Brooking, PTA 07/02/2020, 11:25 AM  [facility] [address] [place], [place], [address] Phone: [phone]   Fax:  [phone]  Name: HUGH GARROW MRN: [id number] Date of Birth: [birth year]

## 2020-07-09 ENCOUNTER — Encounter: Payer: Self-pay | Admitting: Physical Therapy

## 2020-07-09 ENCOUNTER — Other Ambulatory Visit: Payer: Self-pay

## 2020-07-09 ENCOUNTER — Ambulatory Visit: Payer: Medicare PPO | Admitting: Physical Therapy

## 2020-07-09 DIAGNOSIS — R293 Abnormal posture: Secondary | ICD-10-CM

## 2020-07-09 DIAGNOSIS — M6281 Muscle weakness (generalized): Secondary | ICD-10-CM

## 2020-07-09 DIAGNOSIS — M5441 Lumbago with sciatica, right side: Secondary | ICD-10-CM

## 2020-07-09 NOTE — Therapy (Signed)
[facility] [place], [place], [address] Phone: [phone]   Fax:  [phone]  Physical Therapy Treatment  Patient Details  Name: Carol Cox MRN: [id number] Date of Birth: 1951-02-26 Referring Provider (PT): Melina Schools, MD   Encounter Date: 07/09/2020   PT End of Session - 07/09/20 1049    Visit Number 6    Number of Visits 8    Date for PT Re-Evaluation 07/23/20    Authorization Type [employer] Medicare Authorization; FOTO; Progress note every 10th visit    PT Start Time 1032    PT Stop Time 1116    PT Time Calculation (min) 44 min    Activity Tolerance Patient tolerated treatment well    Behavior During Therapy [facility] for tasks assessed/performed           Past Medical History:  Diagnosis Date   Allergy    seasonal   Arthritis    "knee, left hip, ankles"    Borderline glaucoma    CAD (coronary artery disease)    Mild nonobstructive CAD by cath 02/06/13 (25% mid LAD), normal EF   Chronic low back pain    Colitis    Coronary arteriosclerosis    DDD (degenerative disc disease), lumbar    Degeneration of intervertebral disc of lumbar region    Family history of adverse reaction to anesthesia    mother has problems with nausea and vomiting    Glaucoma    History of colitis    02/ 2016  acute infectious colitis-- resolved   History of total knee arthroplasty    Hyperglycemia    Hyperlipidemia    Left ovarian cyst    Low back pain    with bilateral hip radiation    Lumbar radiculopathy    Lumbar spondylolysis    Obesity    Osteoporosis    ospenia of hips   Pain in joint of left shoulder    Pain of right hip joint    PONV (postoperative nausea and vomiting)    SEVERE    Past Surgical History:  Procedure Laterality Date   ABDOMINAL EXPOSURE N/A 05/01/2020   Procedure: ABDOMINAL EXPOSURE;  Surgeon: Rosetta Posner, MD;  Location: [facility];  Service: Vascular;  Laterality: N/A;    ANTERIOR LAT LUMBAR FUSION N/A 05/01/2020   Procedure: ANTERIOR LATERAL LUMBAR FUSION L4-S1;  Surgeon: Melina Schools, MD;  Location: [facility];  Service: Orthopedics;  Laterality: N/A;  4.5 hrs Dr. Donnetta Hutching to do approach tap block with exparel   CARPAL TUNNEL RELEASE Bilateral 1990's   KNEE ARTHROSCOPY W/ MENISCECTOMY Left 07/2014   LEFT HEART CATHETERIZATION WITH CORONARY ANGIOGRAM N/A 02/06/2013   Procedure: LEFT HEART CATHETERIZATION WITH CORONARY ANGIOGRAM;  Surgeon: [name] Hampshire, MD;  Location: [place];  Service: Cardiovascular;  Laterality: N/A;  non-obstruction 25% mLAD,  normal LVF , ef 65-70%   left knee meniscus surgery      NASAL SINUS SURGERY     ovarian cyst     left ovary   ROTATOR CUFF REPAIR Right 10/2016   TOTAL KNEE ARTHROPLASTY Left 03/26/2015   Procedure: LEFT TOTAL KNEE ARTHROPLASTY;  Surgeon: Paralee Cancel, MD;  [facility];  Service: Orthopedics;  Laterality: Left;    There were no vitals filed for this visit.   Subjective Assessment - 07/09/20 1048    Subjective COVID 19 screening performed on patient upon arrival. Having more back pain today. No RLE pain. Increased gabapentin and was prescribed percocet.  Pertinent History Anterior lateral fusion L4-S1 05/01/2020; Posterior lumbar fusion L4-S1 05/02/2020, HTN, CAD, history of left TKA, glaucoma    Limitations Sitting;Standing;Walking;House hold activities    Diagnostic tests MRI    Patient Stated Goals decrease pain, improve strength, and improve ADLs and home activities.    Currently in Pain? Yes    Pain Score 5     Pain Location Back    Pain Orientation Lower    Pain Descriptors / Indicators Discomfort    Pain Type Surgical pain    Pain Onset More than a month ago    Pain Frequency Constant              [facility] PT Assessment - 07/09/20 0001      Assessment   Medical Diagnosis Post-op ALIF L4-L5, PSFI L4-S1; Encounter for follow up examination after completed treatment for conditions other  than malignant neoplasm    Referring Provider (PT) Melina Schools, MD    Onset Date/Surgical Date 05/01/20    Next MD Visit 07/30/2020    Prior Therapy yes      Precautions   Precautions Back    Precaution Comments No bending lifting twisting      Restrictions   Weight Bearing Restrictions No                         [facility] Adult PT Treatment/Exercise - 07/09/20 0001      Lumbar Exercises: Aerobic   Nustep L4 x10 min      Lumbar Exercises: Supine   Ab Set 20 reps;5 seconds    Glut Set 20 reps;5 seconds    Clam 20 reps;5 seconds    Bent Knee Raise 2 seconds;20 reps      Modalities   Modalities Electrical Stimulation      Electrical Stimulation   Electrical Stimulation Location B low back    Electrical Stimulation Action Pre-Mod    Electrical Stimulation Parameters 80-150 hz x10 min    Electrical Stimulation Goals Pain                    PT Short Term Goals - 06/18/20 1708      PT SHORT TERM GOAL #1   Title STG's=LTG's             PT Long Term Goals - 06/27/20 1324      PT LONG TERM GOAL #1   Title Patient will be independent with HEP.    Time 4    Period Weeks    Status On-going      PT LONG TERM GOAL #4   Title Patient will report a centralization of neurological symptoms to R glute or higher to decrease nerve irritation.    Status On-going                 Plan - 07/09/20 1214    Clinical Impression Statement Patient presented in clinic with reports of more LBP today and less LE pain. Patient guided through low level core strengthening and stabilization without complaint of pain. Patient independent with core activation and technique. Normal sitmulation response noted following removal of the modality.    Personal Factors and Comorbidities Comorbidity 3+    Comorbidities Anterior lateral fusion L4-S1 05/01/2020; Posterior lumbar fusion L4-S1 05/02/2020, HTN, CAD, history of left TKA, glaucoma    Examination-Activity Limitations  Dressing;Hygiene/Grooming;Stand;Locomotion Level    Examination-Participation Restrictions Cleaning    Stability/Clinical Decision Making Stable/Uncomplicated    Rehab  Potential Good    PT Frequency 2x / week    PT Duration 4 weeks    PT Treatment/Interventions ADLs/Self Care Home Management;Cryotherapy;Electrical Stimulation;Moist Heat;Gait training;Stair training;Functional mobility training;Therapeutic activities;Therapeutic exercise;Balance training;Neuromuscular re-education;Manual techniques;Passive range of motion;Patient/family education    PT Next Visit Plan Nustep, core stabilization and LE strengthening, STW/M to R QL and glute region; modalities PRN for pain relief    PT Home Exercise Plan HEP    Consulted and Agree with Plan of Care Patient           Patient will benefit from skilled therapeutic intervention in order to improve the following deficits and impairments:  Decreased balance, Decreased activity tolerance, Decreased range of motion, Difficulty walking, Pain, Decreased strength, Decreased mobility, Postural dysfunction, Decreased scar mobility  Visit Diagnosis: Acute bilateral low back pain with right-sided sciatica  Abnormal posture  Muscle weakness (generalized)     Problem List Patient Active Problem List   Diagnosis Date Noted   S/P lumbar fusion 05/01/2020   Hypertension 11/20/2019   Family history of early CAD 07/15/2017   Elevated blood pressure reading in office without diagnosis of hypertension 07/15/2017   Obese 03/27/2015   S/P left TKA 03/26/2015   S/P knee replacement 03/26/2015   Colitis, acute 12/01/2014   Weight loss, unintentional 12/01/2014   Adnexal cyst 12/01/2014   Elevated fasting glucose 12/01/2014   CAD (coronary artery disease) 12/01/2014   Osteoarthritis 12/01/2014   Vasovagal attack 02/28/2013   Hyperlipidemia 02/28/2013    Standley Brooking 07/09/2020, 12:18 PM  [facility] [address] [place], [place], [address] Phone: [phone]   Fax:  [phone]  Name: Carol Cox MRN: [id number] Date of Birth: [birth year]

## 2020-07-11 ENCOUNTER — Ambulatory Visit: Payer: Medicare PPO | Admitting: Physical Therapy

## 2020-07-11 ENCOUNTER — Encounter: Payer: Self-pay | Admitting: Physical Therapy

## 2020-07-11 ENCOUNTER — Other Ambulatory Visit: Payer: Self-pay

## 2020-07-11 DIAGNOSIS — M5441 Lumbago with sciatica, right side: Secondary | ICD-10-CM | POA: Diagnosis not present

## 2020-07-11 DIAGNOSIS — R293 Abnormal posture: Secondary | ICD-10-CM

## 2020-07-11 DIAGNOSIS — M6281 Muscle weakness (generalized): Secondary | ICD-10-CM

## 2020-07-11 NOTE — Therapy (Signed)
[facility] [place], [place], [address] Phone: [phone]   Fax:  [phone]  Physical Therapy Treatment  Patient Details  Name: Carol Cox MRN: [id number] Date of Birth: 1951/01/23 Referring Provider (PT): Melina Schools, MD   Encounter Date: 07/11/2020   PT End of Session - 07/11/20 1038    Visit Number 7    Number of Visits 8    Date for PT Re-Evaluation 07/23/20    Authorization Type [employer] Authorization; FOTO; Progress note every 10th visit    PT Start Time 1032    PT Stop Time 1114    PT Time Calculation (min) 42 min    Activity Tolerance Patient tolerated treatment well    Behavior During Therapy [facility] for tasks assessed/performed           Past Medical History:  Diagnosis Date  . Allergy    seasonal  . Arthritis    "knee, left hip, ankles"   . Borderline glaucoma   . CAD (coronary artery disease)    Mild nonobstructive CAD by cath 02/06/13 (25% mid LAD), normal EF  . Chronic low back pain   . Colitis   . Coronary arteriosclerosis   . DDD (degenerative disc disease), lumbar   . Degeneration of intervertebral disc of lumbar region   . Family history of adverse reaction to anesthesia    mother has problems with nausea and vomiting   . Glaucoma   . History of colitis    02/ 2016  acute infectious colitis-- resolved  . History of total knee arthroplasty   . Hyperglycemia   . Hyperlipidemia   . Left ovarian cyst   . Low back pain    with bilateral hip radiation   . Lumbar radiculopathy   . Lumbar spondylolysis   . Obesity   . Osteoporosis    ospenia of hips  . Pain in joint of left shoulder   . Pain of right hip joint   . PONV (postoperative nausea and vomiting)    SEVERE    Past Surgical History:  Procedure Laterality Date  . ABDOMINAL EXPOSURE N/A 05/01/2020   Procedure: ABDOMINAL EXPOSURE;  Surgeon: Rosetta Posner, MD;  Location: [place];  Service: Vascular;  Laterality: N/A;  .  ANTERIOR LAT LUMBAR FUSION N/A 05/01/2020   Procedure: ANTERIOR LATERAL LUMBAR FUSION L4-S1;  Surgeon: Melina Schools, MD;  Location: [employer];  Service: Orthopedics;  Laterality: N/A;  4.5 hrs Dr. Donnetta Hutching to do approach tap block with exparel  . CARPAL TUNNEL RELEASE Bilateral 1990's  . KNEE ARTHROSCOPY W/ MENISCECTOMY Left 07/2014  . LEFT HEART CATHETERIZATION WITH CORONARY ANGIOGRAM N/A 02/06/2013   Procedure: LEFT HEART CATHETERIZATION WITH CORONARY ANGIOGRAM;  Surgeon: [name] Hampshire, MD;  Location: [place];  Service: Cardiovascular;  Laterality: N/A;  non-obstruction 25% mLAD,  normal LVF , ef 65-70%  . left knee meniscus surgery     . NASAL SINUS SURGERY    . ovarian cyst     left ovary  . ROTATOR CUFF REPAIR Right 10/2016  . TOTAL KNEE ARTHROPLASTY Left 03/26/2015   Procedure: LEFT TOTAL KNEE ARTHROPLASTY;  Surgeon: Paralee Cancel, MD;  [facility]: [facility];  Service: Orthopedics;  Laterality: Left;    There were no vitals filed for this visit.   Subjective Assessment - 07/11/20 1035    Subjective COVID 19 screening performed on patient upon arrival. Having more back pain today. No RLE pain. Reports her legs feel weird and  feels like she has to concentrate while walking. No pain meds at night for several days.    Pertinent History Anterior lateral fusion L4-S1 05/01/2020; Posterior lumbar fusion L4-S1 05/02/2020, HTN, CAD, history of left TKA, glaucoma    Limitations Sitting;Standing;Walking;House hold activities    Diagnostic tests MRI    Patient Stated Goals decrease pain, improve strength, and improve ADLs and home activities.    Currently in Pain? Yes    Pain Score 5     Pain Location Back    Pain Orientation Lower    Pain Descriptors / Indicators Discomfort    Pain Type Surgical pain    Pain Onset More than a month ago    Pain Frequency Constant              [facility] PT Assessment - 07/11/20 0001      Assessment   Medical Diagnosis Post-op ALIF L4-L5, PSFI L4-S1; Encounter for  follow up examination after completed treatment for conditions other than malignant neoplasm    Referring Provider (PT) Melina Schools, MD    Onset Date/Surgical Date 05/01/20    Next MD Visit 07/30/2020    Prior Therapy yes      Precautions   Precautions Back    Precaution Comments No bending lifting twisting      Restrictions   Weight Bearing Restrictions No                         [facility] Adult PT Treatment/Exercise - 07/11/20 0001      Lumbar Exercises: Aerobic   Nustep L4 x15 min      Lumbar Exercises: Standing   Row Strengthening;Both;20 reps;Limitations    Row Limitations Green XTS    Shoulder Extension Strengthening;Both;20 reps;Limitations    Shoulder Extension Limitations Green XTS    Other Standing Lumbar Exercises wall pushups x20 reps      Lumbar Exercises: Supine   Bridge 15 reps;3 seconds    Straight Leg Raise 15 reps;3 seconds                    PT Short Term Goals - 06/18/20 1708      PT SHORT TERM GOAL #1   Title STG's=LTG's             PT Long Term Goals - 06/27/20 1324      PT LONG TERM GOAL #1   Title Patient will be independent with HEP.    Time 4    Period Weeks    Status On-going      PT LONG TERM GOAL #4   Title Patient will report a centralization of neurological symptoms to R glute or higher to decrease nerve irritation.    Status On-going                 Plan - 07/11/20 1326    Clinical Impression Statement Patient continues to present in clinic with reports of continued LBP and her LEs feeling "weird." Patient states that she really has to concentrate while ambulating secondary to weird and wobbly sensation in LEs as well as fear of falling. Patient able to progress to new protocol appropriate exercises without complaint of LBP. More LE strengthening completed with SLRs to regain strength of LEs. No complaints of increased pain during today's treatment.    Personal Factors and Comorbidities Comorbidity  3+    Comorbidities Anterior lateral fusion L4-S1 05/01/2020; Posterior lumbar fusion L4-S1 05/02/2020, HTN, CAD, history  of left TKA, glaucoma    Examination-Activity Limitations Dressing;Hygiene/Grooming;Stand;Locomotion Level    Examination-Participation Restrictions Cleaning    Stability/Clinical Decision Making Stable/Uncomplicated    Rehab Potential Good    PT Frequency 2x / week    PT Duration 4 weeks    PT Treatment/Interventions ADLs/Self Care Home Management;Cryotherapy;Electrical Stimulation;Moist Heat;Gait training;Stair training;Functional mobility training;Therapeutic activities;Therapeutic exercise;Balance training;Neuromuscular re-education;Manual techniques;Passive range of motion;Patient/family education    PT Next Visit Plan Nustep, core stabilization and LE strengthening, STW/M to R QL and glute region; modalities PRN for pain relief    PT Home Exercise Plan HEP    Consulted and Agree with Plan of Care Patient           Patient will benefit from skilled therapeutic intervention in order to improve the following deficits and impairments:  Decreased balance, Decreased activity tolerance, Decreased range of motion, Difficulty walking, Pain, Decreased strength, Decreased mobility, Postural dysfunction, Decreased scar mobility  Visit Diagnosis: Acute bilateral low back pain with right-sided sciatica  Abnormal posture  Muscle weakness (generalized)     Problem List Patient Active Problem List   Diagnosis Date Noted  . S/P lumbar fusion 05/01/2020  . Hypertension 11/20/2019  . Family history of early CAD 07/15/2017  . Elevated blood pressure reading in office without diagnosis of hypertension 07/15/2017  . Obese 03/27/2015  . S/P left TKA 03/26/2015  . S/P knee replacement 03/26/2015  . Colitis, acute 12/01/2014  . Weight loss, unintentional 12/01/2014  . Adnexal cyst 12/01/2014  . Elevated fasting glucose 12/01/2014  . CAD (coronary artery disease) 12/01/2014  .  Osteoarthritis 12/01/2014  . Vasovagal attack 02/28/2013  . Hyperlipidemia 02/28/2013    Standley Brooking, PTA 07/11/2020, 1:34 PM  [facility] [place], [place], [address] Phone: [phone]   Fax:  [phone]  Name: CHRISHELLE ZITO MRN: [id number] Date of Birth: Sep 02, 1951

## 2020-07-16 ENCOUNTER — Ambulatory Visit: Payer: Medicare PPO | Admitting: Physical Therapy

## 2020-07-18 ENCOUNTER — Ambulatory Visit: Payer: Medicare PPO | Admitting: Physical Therapy

## 2020-07-18 ENCOUNTER — Other Ambulatory Visit: Payer: Self-pay

## 2020-07-18 DIAGNOSIS — R293 Abnormal posture: Secondary | ICD-10-CM

## 2020-07-18 DIAGNOSIS — M5441 Lumbago with sciatica, right side: Secondary | ICD-10-CM | POA: Diagnosis not present

## 2020-07-18 DIAGNOSIS — M6281 Muscle weakness (generalized): Secondary | ICD-10-CM

## 2020-07-18 NOTE — Therapy (Signed)
[facility] [place], [place], [address] Phone: [phone]   Fax:  [phone]  Physical Therapy Treatment  Patient Details  Name: Carol Cox MRN: [id number] Date of Birth: 04/05/1951 Referring Provider (PT): Melina Schools, MD   Encounter Date: 07/18/2020   PT End of Session - 07/18/20 1006    Visit Number 8    Number of Visits 12    Date for PT Re-Evaluation 08/02/20    Authorization Type [employer] Authorization; FOTO; Progress note every 10th visit    Authorization Time Period Authorized for 12 visits until 08/02/2020    PT Start Time 0945    PT Stop Time 1027    PT Time Calculation (min) 42 min    Activity Tolerance Patient tolerated treatment well    Behavior During Therapy [facility] for tasks assessed/performed           Past Medical History:  Diagnosis Date  . Allergy    seasonal  . Arthritis    "knee, left hip, ankles"   . Borderline glaucoma   . CAD (coronary artery disease)    Mild nonobstructive CAD by cath 02/06/13 (25% mid LAD), normal EF  . Chronic low back pain   . Colitis   . Coronary arteriosclerosis   . DDD (degenerative disc disease), lumbar   . Degeneration of intervertebral disc of lumbar region   . Family history of adverse reaction to anesthesia    mother has problems with nausea and vomiting   . Glaucoma   . History of colitis    02/ 2016  acute infectious colitis-- resolved  . History of total knee arthroplasty   . Hyperglycemia   . Hyperlipidemia   . Left ovarian cyst   . Low back pain    with bilateral hip radiation   . Lumbar radiculopathy   . Lumbar spondylolysis   . Obesity   . Osteoporosis    ospenia of hips  . Pain in joint of left shoulder   . Pain of right hip joint   . PONV (postoperative nausea and vomiting)    SEVERE    Past Surgical History:  Procedure Laterality Date  . ABDOMINAL EXPOSURE N/A 05/01/2020   Procedure: ABDOMINAL EXPOSURE;  Surgeon: Rosetta Posner, MD;  Location: [place];  Service: Vascular;  Laterality: N/A;  . ANTERIOR LAT LUMBAR FUSION N/A 05/01/2020   Procedure: ANTERIOR LATERAL LUMBAR FUSION L4-S1;  Surgeon: Melina Schools, MD;  Location: [place];  Service: Orthopedics;  Laterality: N/A;  4.5 hrs Dr. Donnetta Hutching to do approach tap block with exparel  . CARPAL TUNNEL RELEASE Bilateral 1990's  . KNEE ARTHROSCOPY W/ MENISCECTOMY Left 07/2014  . LEFT HEART CATHETERIZATION WITH CORONARY ANGIOGRAM N/A 02/06/2013   Procedure: LEFT HEART CATHETERIZATION WITH CORONARY ANGIOGRAM;  Surgeon: [name] Hampshire, MD;  Location: [place] CATH LAB;  Service: Cardiovascular;  Laterality: N/A;  non-obstruction 25% mLAD,  normal LVF , ef 65-70%  . left knee meniscus surgery     . NASAL SINUS SURGERY    . ovarian cyst     left ovary  . ROTATOR CUFF REPAIR Right 10/2016  . TOTAL KNEE ARTHROPLASTY Left 03/26/2015   Procedure: LEFT TOTAL KNEE ARTHROPLASTY;  Surgeon: Paralee Cancel, MD;  Location: [facility];  Service: Orthopedics;  Laterality: Left;    There were no vitals filed for this visit.   Subjective Assessment - 07/18/20 0957    Subjective COVID 19 screening performed on patient upon arrival.  Patient  arrived with no pain.    Pertinent History Anterior lateral fusion L4-S1 05/01/2020; Posterior lumbar fusion L4-S1 05/02/2020, HTN, CAD, history of left TKA, glaucoma    Limitations Sitting;Standing;Walking;House hold activities    Diagnostic tests MRI    Patient Stated Goals decrease pain, improve strength, and improve ADLs and home activities.    Currently in Pain? No/denies                             [facility] Adult PT Treatment/Exercise - 07/18/20 0001      Lumbar Exercises: Aerobic   Nustep L4 x15 min      Lumbar Exercises: Standing   Heel Raises 20 reps    Row Strengthening;Both;20 reps;10 reps;Theraband    Row Limitations green XTS    Shoulder Extension Strengthening;Both;20 reps;10 reps    Shoulder Extension Limitations gren XTS     Other Standing Lumbar Exercises wall pushups x20 reps      Lumbar Exercises: Supine   Bridge with March 5 seconds   2x10   Straight Leg Raise 3 seconds   2x10     Knee/Hip Exercises: Standing   Hip Abduction Stengthening;Both;1 set;10 reps    Hip Extension Stengthening;Both;1 set;10 reps;Knee straight                    PT Short Term Goals - 06/18/20 1708      PT SHORT TERM GOAL #1   Title STG's=LTG's             PT Long Term Goals - 07/18/20 1001      PT LONG TERM GOAL #1   Title Patient will be independent with HEP.    Time 4    Period Weeks    Status On-going      PT LONG TERM GOAL #2   Title Patient will report ability to perform ADLs with low back pain and right LE pain less than or equal to 3/10.    Baseline Pain up to 5-6/10 with ADL's 07/18/20    Time 4    Period Weeks    Status On-going      PT LONG TERM GOAL #3   Title Patient will demonstrate 4+/5 or greater bilateral LE MMT to improve stability during functional tasks.    Time 4    Period Weeks    Status On-going      PT LONG TERM GOAL #4   Title Patient will report a centralization of neurological symptoms to R glute or higher to decrease nerve irritation.    Baseline Pain is less in lower leg only a few days out of the week 07/18/20    Time 4    Period Weeks    Status On-going                 Plan - 07/18/20 1017    Clinical Impression Statement Patient tolerated treatment well today. Patient reported no pain today and was able to progress with standing LE exercises. Patient has pain symptoms with prolong ADL's and LE symptoms only a few times a week. Patient is going to MD for F/u in a few weeks. current goals are ongoing due to weakness and pain limitations.    Personal Factors and Comorbidities Comorbidity 3+    Comorbidities Anterior lateral fusion L4-S1 05/01/2020; Posterior lumbar fusion L4-S1 05/02/2020, HTN, CAD, history of left TKA, glaucoma    Examination-Activity Limitations  Dressing;Hygiene/Grooming;Stand;Locomotion Level  Examination-Participation Restrictions Cleaning    Stability/Clinical Decision Making Stable/Uncomplicated    Rehab Potential Good    PT Frequency 2x / week    PT Duration 4 weeks    PT Treatment/Interventions ADLs/Self Care Home Management;Cryotherapy;Electrical Stimulation;Moist Heat;Gait training;Stair training;Functional mobility training;Therapeutic activities;Therapeutic exercise;Balance training;Neuromuscular re-education;Manual techniques;Passive range of motion;Patient/family education    PT Next Visit Plan Nustep, core stabilization and LE strengthening, STW/M to R QL and glute region; modalities PRN for pain relief    Consulted and Agree with Plan of Care Patient           Patient will benefit from skilled therapeutic intervention in order to improve the following deficits and impairments:  Decreased balance, Decreased activity tolerance, Decreased range of motion, Difficulty walking, Pain, Decreased strength, Decreased mobility, Postural dysfunction, Decreased scar mobility  Visit Diagnosis: Acute bilateral low back pain with right-sided sciatica  Abnormal posture  Muscle weakness (generalized)     Problem List Patient Active Problem List   Diagnosis Date Noted  . S/P lumbar fusion 05/01/2020  . Hypertension 11/20/2019  . Family history of early CAD 07/15/2017  . Elevated blood pressure reading in office without diagnosis of hypertension 07/15/2017  . Obese 03/27/2015  . S/P left TKA 03/26/2015  . S/P knee replacement 03/26/2015  . Colitis, acute 12/01/2014  . Weight loss, unintentional 12/01/2014  . Adnexal cyst 12/01/2014  . Elevated fasting glucose 12/01/2014  . CAD (coronary artery disease) 12/01/2014  . Osteoarthritis 12/01/2014  . Vasovagal attack 02/28/2013  . Hyperlipidemia 02/28/2013    [name], PTA 07/18/20 3:14 PM  [facility] [place], [place], [address] Phone: [phone]   Fax:  [phone]  Name: TIANNE PLOTT MRN: [id number] Date of Birth: July 21, 1951

## 2020-07-23 ENCOUNTER — Ambulatory Visit: Payer: Medicare PPO | Admitting: Physical Therapy

## 2020-07-23 ENCOUNTER — Other Ambulatory Visit: Payer: Self-pay

## 2020-07-23 ENCOUNTER — Encounter: Payer: Self-pay | Admitting: Physical Therapy

## 2020-07-23 DIAGNOSIS — M5441 Lumbago with sciatica, right side: Secondary | ICD-10-CM | POA: Diagnosis not present

## 2020-07-23 DIAGNOSIS — M6281 Muscle weakness (generalized): Secondary | ICD-10-CM

## 2020-07-23 DIAGNOSIS — R293 Abnormal posture: Secondary | ICD-10-CM

## 2020-07-23 NOTE — Therapy (Signed)
[facility] [place], [place], [address] Phone: [phone]   Fax:  [phone]  Physical Therapy Treatment  Patient Details  Name: Carol Cox MRN: [id number] Date of Birth: 1951/09/05 Referring Provider (PT): Melina Schools, MD   Encounter Date: 07/23/2020   PT End of Session - 07/23/20 1142    Visit Number 9    Number of Visits 12    Date for PT Re-Evaluation 08/02/20    Authorization Type [employer] Medicare Authorization; FOTO; Progress note every 10th visit    PT Start Time 1035    PT Stop Time 1115    PT Time Calculation (min) 40 min    Activity Tolerance Patient tolerated treatment well    Behavior During Therapy [facility] for tasks assessed/performed           Past Medical History:  Diagnosis Date  . Allergy    seasonal  . Arthritis    "knee, left hip, ankles"   . Borderline glaucoma   . CAD (coronary artery disease)    Mild nonobstructive CAD by cath 02/06/13 (25% mid LAD), normal EF  . Chronic low back pain   . Colitis   . Coronary arteriosclerosis   . DDD (degenerative disc disease), lumbar   . Degeneration of intervertebral disc of lumbar region   . Family history of adverse reaction to anesthesia    mother has problems with nausea and vomiting   . Glaucoma   . History of colitis    02/ 2016  acute infectious colitis-- resolved  . History of total knee arthroplasty   . Hyperglycemia   . Hyperlipidemia   . Left ovarian cyst   . Low back pain    with bilateral hip radiation   . Lumbar radiculopathy   . Lumbar spondylolysis   . Obesity   . Osteoporosis    ospenia of hips  . Pain in joint of left shoulder   . Pain of right hip joint   . PONV (postoperative nausea and vomiting)    SEVERE    Past Surgical History:  Procedure Laterality Date  . ABDOMINAL EXPOSURE N/A 05/01/2020   Procedure: ABDOMINAL EXPOSURE;  Surgeon: Rosetta Posner, MD;  Location: [place];  Service: Vascular;  Laterality: N/A;  .  ANTERIOR LAT LUMBAR FUSION N/A 05/01/2020   Procedure: ANTERIOR LATERAL LUMBAR FUSION L4-S1;  Surgeon: Melina Schools, MD;  Location: [place];  Service: Orthopedics;  Laterality: N/A;  4.5 hrs Dr. Donnetta Hutching to do approach tap block with exparel  . CARPAL TUNNEL RELEASE Bilateral 1990's  . KNEE ARTHROSCOPY W/ MENISCECTOMY Left 07/2014  . LEFT HEART CATHETERIZATION WITH CORONARY ANGIOGRAM N/A 02/06/2013   Procedure: LEFT HEART CATHETERIZATION WITH CORONARY ANGIOGRAM;  Surgeon: [name] Hampshire, MD;  Location: [place] CATH LAB;  Service: Cardiovascular;  Laterality: N/A;  non-obstruction 25% mLAD,  normal LVF , ef 65-70%  . left knee meniscus surgery     . NASAL SINUS SURGERY    . ovarian cyst     left ovary  . ROTATOR CUFF REPAIR Right 10/2016  . TOTAL KNEE ARTHROPLASTY Left 03/26/2015   Procedure: LEFT TOTAL KNEE ARTHROPLASTY;  Surgeon: Paralee Cancel, MD;  Location: [facility];  Service: Orthopedics;  Laterality: Left;    There were no vitals filed for this visit.   Subjective Assessment - 07/23/20 1039    Subjective COVID 19 screening performed on patient upon arrival.  Patient arrived with minimal LBP and is tried to wash her car but  limited herself with bending.    Pertinent History Anterior lateral fusion L4-S1 05/01/2020; Posterior lumbar fusion L4-S1 05/02/2020, HTN, CAD, history of left TKA, glaucoma    Limitations Sitting;Standing;Walking;House hold activities    Diagnostic tests MRI    Patient Stated Goals decrease pain, improve strength, and improve ADLs and home activities.    Currently in Pain? Yes    Pain Score 3     Pain Location Back    Pain Orientation Lower    Pain Descriptors / Indicators Discomfort    Pain Type Surgical pain    Pain Onset More than a month ago    Pain Frequency Intermittent              [facility] PT Assessment - 07/23/20 0001      Assessment   Medical Diagnosis Post-op ALIF L4-L5, PSFI L4-S1; Encounter for follow up examination after completed treatment for conditions  other than malignant neoplasm    Referring Provider (PT) Melina Schools, MD    Onset Date/Surgical Date 05/01/20    Next MD Visit 07/30/2020    Prior Therapy yes      Precautions   Precautions Back    Precaution Comments No bending lifting twisting      Restrictions   Weight Bearing Restrictions No                         [facility] Adult PT Treatment/Exercise - 07/23/20 0001      Lumbar Exercises: Aerobic   Nustep L5 x19 min      Lumbar Exercises: Standing   Shoulder Extension Strengthening;Both;20 reps;10 reps    Shoulder Extension Limitations Blue XTS    Other Standing Lumbar Exercises wall pushups x20 reps    Other Standing Lumbar Exercises B chop/lift blue XTS x15 reps each      Lumbar Exercises: Supine   Bridge 20 reps;5 seconds      Lumbar Exercises: Sidelying   Clam Both;20 reps;3 seconds    Clam Limitations green theraband                    PT Short Term Goals - 06/18/20 1708      PT SHORT TERM GOAL #1   Title STG's=LTG's             PT Long Term Goals - 07/18/20 1001      PT LONG TERM GOAL #1   Title Patient will be independent with HEP.    Time 4    Period Weeks    Status On-going      PT LONG TERM GOAL #2   Title Patient will report ability to perform ADLs with low back pain and right LE pain less than or equal to 3/10.    Baseline Pain up to 5-6/10 with ADL's 07/18/20    Time 4    Period Weeks    Status On-going      PT LONG TERM GOAL #3   Title Patient will demonstrate 4+/5 or greater bilateral LE MMT to improve stability during functional tasks.    Time 4    Period Weeks    Status On-going      PT LONG TERM GOAL #4   Title Patient will report a centralization of neurological symptoms to R glute or higher to decrease nerve irritation.    Baseline Pain is less in lower leg only a few days out of the week 07/18/20    Time 4  Period Weeks    Status On-going                 Plan - 07/23/20 1142    Clinical  Impression Statement Patient presented in clinic with reports of minimal LBP and overall decreased LE pain. Patient progressed to some resisted standing with core activation. No reports of pain throughout treatment. Patient noting some improvement in regards to LE strength. Patient did report muscle fatigue by end of treatment.    Personal Factors and Comorbidities Comorbidity 3+    Comorbidities Anterior lateral fusion L4-S1 05/01/2020; Posterior lumbar fusion L4-S1 05/02/2020, HTN, CAD, history of left TKA, glaucoma    Examination-Activity Limitations Dressing;Hygiene/Grooming;Stand;Locomotion Level    Examination-Participation Restrictions Cleaning    Stability/Clinical Decision Making Stable/Uncomplicated    Rehab Potential Good    PT Frequency 2x / week    PT Duration 4 weeks    PT Treatment/Interventions ADLs/Self Care Home Management;Cryotherapy;Electrical Stimulation;Moist Heat;Gait training;Stair training;Functional mobility training;Therapeutic activities;Therapeutic exercise;Balance training;Neuromuscular re-education;Manual techniques;Passive range of motion;Patient/family education    PT Next Visit Plan Nustep, core stabilization and LE strengthening, STW/M to R QL and glute region; modalities PRN for pain relief    PT Home Exercise Plan HEP    Consulted and Agree with Plan of Care Patient           Patient will benefit from skilled therapeutic intervention in order to improve the following deficits and impairments:  Decreased balance, Decreased activity tolerance, Decreased range of motion, Difficulty walking, Pain, Decreased strength, Decreased mobility, Postural dysfunction, Decreased scar mobility  Visit Diagnosis: Acute bilateral low back pain with right-sided sciatica  Abnormal posture  Muscle weakness (generalized)     Problem List Patient Active Problem List   Diagnosis Date Noted  . S/P lumbar fusion 05/01/2020  . Hypertension 11/20/2019  . Family history of early  CAD 07/15/2017  . Elevated blood pressure reading in office without diagnosis of hypertension 07/15/2017  . Obese 03/27/2015  . S/P left TKA 03/26/2015  . S/P knee replacement 03/26/2015  . Colitis, acute 12/01/2014  . Weight loss, unintentional 12/01/2014  . Adnexal cyst 12/01/2014  . Elevated fasting glucose 12/01/2014  . CAD (coronary artery disease) 12/01/2014  . Osteoarthritis 12/01/2014  . Vasovagal attack 02/28/2013  . Hyperlipidemia 02/28/2013    Standley Brooking, PTA 07/23/2020, 11:48 AM  [facility] [address] [place], [place], [address] Phone: [phone]   Fax:  [phone]  Name: NAVADA OSTERHOUT MRN: [id number] Date of Birth: 1951-05-26

## 2020-07-25 ENCOUNTER — Ambulatory Visit: Payer: Medicare PPO | Admitting: Physical Therapy

## 2020-07-25 ENCOUNTER — Other Ambulatory Visit: Payer: Self-pay

## 2020-07-25 DIAGNOSIS — R293 Abnormal posture: Secondary | ICD-10-CM

## 2020-07-25 DIAGNOSIS — M6281 Muscle weakness (generalized): Secondary | ICD-10-CM

## 2020-07-25 DIAGNOSIS — M5441 Lumbago with sciatica, right side: Secondary | ICD-10-CM

## 2020-07-25 NOTE — Patient Instructions (Signed)
  ELASTIC BAND SHOULDER DIAGONAL  EXT - ABD  While holding an elastic band across the upper half of your body, pull the band downward and across towards your other side. Your hand should start in the thumb-up position and end in the thumb-down position.  Can use both hands together with core activation on both sides   Standing lat pull with theraband  Anchor bands higher than your head. Start with your arms straight out in front of you at shoulder height (or a little above).  Pull bands down next to your body and then slowly return to the starting position.  10-30 x1day    Bridging with Theraband  Begin by lying on your back with your knees bent and theraband around your knees. Tighten your abs by tilting your pelvis up and flattening your back on the mat. Squeeze your glutes and raise your hips off of the table towards the ceiling. Keeping your hips raised, pull your knees outward against the band. Relax your knees back to midline and slowly return your hips to the mat. Relax and Breathe 2x10 1x daily   Strengthening: Hip Abduction (Side-Lying)   Tighten muscles on front of left thigh, then lift leg __5__ inches from surface, keeping knee locked.  Repeat __10__ times per set. Do __2__ sets per session. Do _2___ sessions per day.

## 2020-07-25 NOTE — Therapy (Signed)
[facility] [place], [place], [address] Phone: [phone]   Fax:  [phone]  Physical Therapy Treatment  Progress Note Reporting Period 06/18/2020 to 07/25/2020  See note below for Objective Data and Assessment of Progress/Goals. Patient's has been making improvements and goals are ongoing at this time. Gabriela Eves, PT, DPT     Patient Details  Name: Carol Cox MRN: [id number] Date of Birth: Apr 06, 1951 Referring Provider (PT): Melina Schools, MD   Encounter Date: 07/25/2020   PT End of Session - 07/25/20 1040    Visit Number 10    Number of Visits 12    Date for PT Re-Evaluation 08/02/20    Authorization Type [employer] Medicare Authorization; [place] 10th visit 47%; Progress note every 10th visit    Authorization Time Period Authorized for 12 visits until 08/02/2020    PT Start Time 1030    PT Stop Time 1113    PT Time Calculation (min) 43 min    Activity Tolerance Patient tolerated treatment well    Behavior During Therapy WFL for tasks assessed/performed           Past Medical History:  Diagnosis Date  . Allergy    seasonal  . Arthritis    "knee, left hip, ankles"   . Borderline glaucoma   . CAD (coronary artery disease)    Mild nonobstructive CAD by cath 02/06/13 (25% mid LAD), normal EF  . Chronic low back pain   . Colitis   . Coronary arteriosclerosis   . DDD (degenerative disc disease), lumbar   . Degeneration of intervertebral disc of lumbar region   . Family history of adverse reaction to anesthesia    mother has problems with nausea and vomiting   . Glaucoma   . History of colitis    02/ 2016  acute infectious colitis-- resolved  . History of total knee arthroplasty   . Hyperglycemia   . Hyperlipidemia   . Left ovarian cyst   . Low back pain    with bilateral hip radiation   . Lumbar radiculopathy   . Lumbar spondylolysis   . Obesity   . Osteoporosis    ospenia of hips  . Pain in  joint of left shoulder   . Pain of right hip joint   . PONV (postoperative nausea and vomiting)    SEVERE    Past Surgical History:  Procedure Laterality Date  . ABDOMINAL EXPOSURE N/A 05/01/2020   Procedure: ABDOMINAL EXPOSURE;  Surgeon: Rosetta Posner, MD;  Location: [facility];  Service: Vascular;  Laterality: N/A;  . ANTERIOR LAT LUMBAR FUSION N/A 05/01/2020   Procedure: ANTERIOR LATERAL LUMBAR FUSION L4-S1;  Surgeon: Melina Schools, MD;  Location: [facility];  Service: Orthopedics;  Laterality: N/A;  4.5 hrs Dr. Donnetta Hutching to do approach tap block with exparel  . CARPAL TUNNEL RELEASE Bilateral 1990's  . KNEE ARTHROSCOPY W/ MENISCECTOMY Left 07/2014  . LEFT HEART CATHETERIZATION WITH CORONARY ANGIOGRAM N/A 02/06/2013   Procedure: LEFT HEART CATHETERIZATION WITH CORONARY ANGIOGRAM;  Surgeon: [name] Hampshire, MD;  Location: [facility];  Service: Cardiovascular;  Laterality: N/A;  non-obstruction 25% mLAD,  normal LVF , ef 65-70%  . left knee meniscus surgery     . NASAL SINUS SURGERY    . ovarian cyst     left ovary  . ROTATOR CUFF REPAIR Right 10/2016  . TOTAL KNEE ARTHROPLASTY Left 03/26/2015   Procedure: LEFT TOTAL KNEE ARTHROPLASTY;  Surgeon: Paralee Cancel, MD;  Location: [facility];  Service: Orthopedics;  Laterality: Left;    There were no vitals filed for this visit.   Subjective Assessment - 07/25/20 1032    Subjective COVID 19 screening performed on patient upon arrival.  Patient arrived with no back pain yet some left thigh discomfort    Pertinent History Anterior lateral fusion L4-S1 05/01/2020; Posterior lumbar fusion L4-S1 05/02/2020, HTN, CAD, history of left TKA, glaucoma    Limitations Sitting;Standing;Walking;House hold activities    Diagnostic tests MRI    Patient Stated Goals decrease pain, improve strength, and improve ADLs and home activities.    Currently in Pain? No/denies                             [facility] Adult PT Treatment/Exercise - 07/25/20 0001      Lumbar  Exercises: Aerobic   Nustep L5 x72mn UE/LE activity      Lumbar Exercises: Standing   Shoulder Extension Strengthening;Both   2x15   Shoulder Extension Limitations Blue XTS    Other Standing Lumbar Exercises wall pushups x20 reps    Other Standing Lumbar Exercises B chop/lift blue XTS x15 reps each      Lumbar Exercises: Supine   Bridge with clamshell 20 reps   green t-band     Lumbar Exercises: Sidelying   Hip Abduction Both;20 reps      Knee/Hip Exercises: Standing   Hip Extension Stengthening;Both;2 sets;10 reps;Knee straight      Knee/Hip Exercises: [employer], manufacturing systemsBoth   2x10   Marching Limitations green band      Knee/Hip Exercises: Supine   Straight Leg Raise with External Rotation Strengthening;Both;20 reps                    PT Short Term Goals - 06/18/20 1708      PT SHORT TERM GOAL #1   Title STG's=LTG's             PT Long Term Goals - 07/25/20 1035      PT LONG TERM GOAL #1   Title Patient will be independent with HEP.    Baseline Met 07/25/20    Time 4    Period Weeks    Status Achieved      PT LONG TERM GOAL #2   Title Patient will report ability to perform ADLs with low back pain and right LE pain less than or equal to 3/10.    Baseline Pain at 4/10 and more with ADL's 07/25/20    Time 4    Period Weeks    Status On-going      PT LONG TERM GOAL #3   Title Patient will demonstrate 4+/5 or greater bilateral LE MMT to improve stability during functional tasks.    Time 4    Period Weeks    Status On-going      PT LONG TERM GOAL #4   Title Patient will report a centralization of neurological symptoms to R glute or higher to decrease nerve irritation.    Baseline Symptoms are decreased and down to calf at times 07/25/20    Time 4    Period Weeks    Status On-going                 Plan - 07/25/20 1111    Clinical Impression Statement Patient tolerated treatment well today. Patient has reported less symptoms  in back and right LE. Pain  will increase with prolong activity and ADL's. Patient is able to perform ADL's modified with greater ease and able to perfrom walking and stairs with greater comfort. Patient continues to have her limitations due to weakness and syptoms yet overall improvement. Patient issued HEP progression today. Patient is going to MD for F/U next week. LTG #[id number] with others progressing.    Personal Factors and Comorbidities Comorbidity 3+    Comorbidities Anterior lateral fusion L4-S1 05/01/2020; Posterior lumbar fusion L4-S1 05/02/2020, HTN, CAD, history of left TKA, glaucoma    Examination-Activity Limitations Dressing;Hygiene/Grooming;Stand;Locomotion Level    Examination-Participation Restrictions Cleaning    Stability/Clinical Decision Making Stable/Uncomplicated    Rehab Potential Good    PT Frequency 2x / week    PT Duration 4 weeks    PT Treatment/Interventions ADLs/Self Care Home Management;Cryotherapy;Electrical Stimulation;Moist Heat;Gait training;Stair training;Functional mobility training;Therapeutic activities;Therapeutic exercise;Balance training;Neuromuscular re-education;Manual techniques;Passive range of motion;Patient/family education    PT Next Visit Plan cont with core stabilization and LE strengthening/ assess strength goal and MD note next treatment (Dr. BRolena Infante    Consulted and Agree with Plan of Care Patient           Patient will benefit from skilled therapeutic intervention in order to improve the following deficits and impairments:  Decreased balance, Decreased activity tolerance, Decreased range of motion, Difficulty walking, Pain, Decreased strength, Decreased mobility, Postural dysfunction, Decreased scar mobility  Visit Diagnosis: Acute bilateral low back pain with right-sided sciatica  Abnormal posture  Muscle weakness (generalized)     Problem List Patient Active Problem List   Diagnosis Date Noted  . S/P lumbar fusion 05/01/2020  .  Hypertension 11/20/2019  . Family history of early CAD 07/15/2017  . Elevated blood pressure reading in office without diagnosis of hypertension 07/15/2017  . Obese 03/27/2015  . S/P left TKA 03/26/2015  . S/P knee replacement 03/26/2015  . Colitis, acute 12/01/2014  . Weight loss, unintentional 12/01/2014  . Adnexal cyst 12/01/2014  . Elevated fasting glucose 12/01/2014  . CAD (coronary artery disease) 12/01/2014  . Osteoarthritis 12/01/2014  . Vasovagal attack 02/28/2013  . Hyperlipidemia 02/28/2013    CLadean Raya PTA 07/25/20 11:18 AM  [facility] [address] [address]Phone: [phone]  Fax:  [phone] Name: RLAELLE BRIDGETTMRN: [id number] of Birth: [birth year]

## 2020-07-30 ENCOUNTER — Other Ambulatory Visit: Payer: Self-pay

## 2020-07-30 ENCOUNTER — Ambulatory Visit: Payer: Medicare PPO | Attending: Orthopedic Surgery | Admitting: Physical Therapy

## 2020-07-30 ENCOUNTER — Encounter: Payer: Self-pay | Admitting: Physical Therapy

## 2020-07-30 DIAGNOSIS — M6281 Muscle weakness (generalized): Secondary | ICD-10-CM | POA: Diagnosis present

## 2020-07-30 DIAGNOSIS — M5441 Lumbago with sciatica, right side: Secondary | ICD-10-CM | POA: Diagnosis present

## 2020-07-30 DIAGNOSIS — R293 Abnormal posture: Secondary | ICD-10-CM | POA: Insufficient documentation

## 2020-07-30 NOTE — Therapy (Addendum)
[facility] [place], [place], [address] Phone: [phone]   Fax:  [phone]  Physical Therapy Treatment PHYSICAL THERAPY DISCHARGE SUMMARY  Visits from Start of Care: 11  Current functional level related to goals / functional outcomes: See below    Remaining deficits: See goals   Education / Equipment: HEP  Plan: Patient agrees to discharge.  Patient goals were partially met. Patient is being discharged due to being pleased with the current functional level.  ?????      Patient Details  Name: Carol Cox  MRN: [id number] Date of Birth: January 30, 1951 Referring Provider (PT): Melina Schools, MD   Encounter Date: 07/30/2020   PT End of Session - 07/30/20 1043    Visit Number 11    Number of Visits 12    Date for PT Re-Evaluation 08/02/20    Authorization Type [employer] Medicare Authorization; [place] 10th visit 47%; Progress note every 10th visit    Authorization Time Period Authorized for 12 visits until 08/02/2020    PT Start Time 1031    PT Stop Time 1115    PT Time Calculation (min) 44 min    Activity Tolerance Patient tolerated treatment well    Behavior During Therapy WFL for tasks assessed/performed           Past Medical History:  Diagnosis Date   Allergy    seasonal   Arthritis    "knee, left hip, ankles"    Borderline glaucoma    CAD (coronary artery disease)    Mild nonobstructive CAD by cath 02/06/13 (25% mid LAD), normal EF   Chronic low back pain    Colitis    Coronary arteriosclerosis    DDD (degenerative disc disease), lumbar    Degeneration of intervertebral disc of lumbar region    Family history of adverse reaction to anesthesia    mother has problems with nausea and vomiting    Glaucoma    History of colitis    02/ 2016  acute infectious colitis-- resolved   History of total knee arthroplasty    Hyperglycemia    Hyperlipidemia    Left ovarian cyst    Low back  pain    with bilateral hip radiation    Lumbar radiculopathy    Lumbar spondylolysis    Obesity    Osteoporosis    ospenia of hips   Pain in joint of left shoulder    Pain of right hip joint    PONV (postoperative nausea and vomiting)    SEVERE    Past Surgical History:  Procedure Laterality Date   ABDOMINAL EXPOSURE N/A 05/01/2020   Procedure: ABDOMINAL EXPOSURE;  Surgeon: Rosetta Posner, MD;  Location: [facility];  Service: Vascular;  Laterality: N/A;   ANTERIOR LAT LUMBAR FUSION N/A 05/01/2020   Procedure: ANTERIOR LATERAL LUMBAR FUSION L4-S1;  Surgeon: Melina Schools, MD;  Location: [facility];  Service: Orthopedics;  Laterality: N/A;  4.5 hrs Dr. Donnetta Hutching to do approach tap block with exparel   CARPAL TUNNEL RELEASE Bilateral 1990's   KNEE ARTHROSCOPY W/ MENISCECTOMY Left 07/2014   LEFT HEART CATHETERIZATION WITH CORONARY ANGIOGRAM N/A 02/06/2013   Procedure: LEFT HEART CATHETERIZATION WITH CORONARY ANGIOGRAM;  Surgeon: [name] Hampshire, MD;  Location: [facility];  Service: Cardiovascular;  Laterality: N/A;  non-obstruction 25% mLAD,  normal LVF , ef 65-70%   left knee meniscus surgery      NASAL SINUS SURGERY     ovarian cyst  left ovary   ROTATOR CUFF REPAIR Right 10/2016   TOTAL KNEE ARTHROPLASTY Left 03/26/2015   Procedure: LEFT TOTAL KNEE ARTHROPLASTY;  Surgeon: Paralee Cancel, MD;  Location: [facility];  Service: Orthopedics;  Laterality: Left;    There were no vitals filed for this visit.   Subjective Assessment - 07/30/20 1041    Subjective COVID 19 screening performed on patient upon arrival.  Patient arrived with 4/10 LBP but did have some RLE nerve pain last night.    Pertinent History Anterior lateral fusion L4-S1 05/01/2020; Posterior lumbar fusion L4-S1 05/02/2020, HTN, CAD, history of left TKA, glaucoma    Limitations Sitting;Standing;Walking;House hold activities    Diagnostic tests MRI    Patient Stated Goals decrease pain, improve strength, and improve ADLs and  home activities.    Currently in Pain? Yes    Pain Score 4     Pain Location Back    Pain Orientation Lower    Pain Descriptors / Indicators Discomfort    Pain Type Surgical pain    Pain Onset More than a month ago    Pain Frequency Intermittent              [facility] PT Assessment - 07/30/20 0001      Assessment   Medical Diagnosis Post-op ALIF L4-L5, PSFI L4-S1; Encounter for follow up examination after completed treatment for conditions other than malignant neoplasm    Referring Provider (PT) Melina Schools, MD    Onset Date/Surgical Date 05/01/20    Next MD Visit 07/30/2020    Prior Therapy yes      Precautions   Precautions Back    Precaution Comments No bending lifting twisting      Restrictions   Weight Bearing Restrictions No      ROM / Strength   AROM / PROM / Strength Strength      Strength   Overall Strength Within functional limits for tasks performed    Strength Assessment Site Hip;Knee    Right/Left Hip Right;Left    Right Hip Flexion 4/5    Right Hip ABduction 4+/5    Left Hip Flexion 4+/5    Left Hip ABduction 4+/5    Right/Left Knee Right;Left    Right Knee Flexion 4/5    Right Knee Extension 4+/5    Left Knee Flexion 4+/5    Left Knee Extension 4+/5                         [facility] Adult PT Treatment/Exercise - 07/30/20 0001      Lumbar Exercises: Aerobic   Nustep L5 x75mn UE/LE activity      Lumbar Exercises: Standing   Functional Squats 15 reps;2 seconds    Forward Lunge 10 reps;2 seconds   with 4" reachouts   Row Strengthening;Both;20 reps;Limitations    Row Limitations Blue XTS    Shoulder Extension Strengthening;Both;20 reps;Limitations    Shoulder Extension Limitations Blue XTS    Other Standing Lumbar Exercises B chop/lift blue XTS x15 reps each      Lumbar Exercises: Seated   Sit to Stand 15 reps      Lumbar Exercises: Supine   Clam 20 reps;5 seconds    Clam Limitations red theraband    Bridge 20 reps;5 seconds     Straight Leg Raise 20 reps                    PT Short Term Goals - 06/18/20 1708  PT SHORT TERM GOAL #1   Title STG's=LTG's             PT Long Term Goals - 07/30/20 1116      PT LONG TERM GOAL #1   Title Patient will be independent with HEP.    Baseline Met 07/25/20    Time 4    Period Weeks    Status Achieved      PT LONG TERM GOAL #2   Title Patient will report ability to perform ADLs with low back pain and right LE pain less than or equal to 3/10.    Baseline Pain at 4/10 and more with ADL's 07/25/20    Time 4    Period Weeks    Status Partially Met   LE pain worse at night and higher level per patient report 07/30/2020     PT LONG TERM GOAL #3   Title Patient will demonstrate 4+/5 or greater bilateral LE MMT to improve stability during functional tasks.    Time 4    Period Weeks    Status Partially Met      PT LONG TERM GOAL #4   Title Patient will report a centralization of neurological symptoms to R glute or higher to decrease nerve irritation.    Baseline Symptoms are decreased and down to calf at times 07/25/20    Time 4    Period Weeks    Status Not Met   pain along tibia per patient report 07/30/2020                Plan - 07/30/20 1347    Clinical Impression Statement Patient presented in clinic with reports of only intermittant RLE tibial nerve pain but more consistent LBP. Patient progressed through more functional strengthening core strengthening as well as LE strengthening. Patient's BLE MMT assessed as 4/5-4+/5. Patient still experiencing some tibial nerve related pain but that is only region affected of RLE. LTGs met or partially met other than neurological symptoms goal.    Personal Factors and Comorbidities Comorbidity 3+    Comorbidities Anterior lateral fusion L4-S1 05/01/2020; Posterior lumbar fusion L4-S1 05/02/2020, HTN, CAD, history of left TKA, glaucoma    Examination-Activity Limitations  Dressing;Hygiene/Grooming;Stand;Locomotion Level    Examination-Participation Restrictions Cleaning    Stability/Clinical Decision Making Stable/Uncomplicated    Rehab Potential Good    PT Frequency 2x / week    PT Duration 4 weeks    PT Treatment/Interventions ADLs/Self Care Home Management;Cryotherapy;Electrical Stimulation;Moist Heat;Gait training;Stair training;Functional mobility training;Therapeutic activities;Therapeutic exercise;Balance training;Neuromuscular re-education;Manual techniques;Passive range of motion;Patient/family education    PT Next Visit Plan cont with core stabilization and LE strengthening/ assess strength goal and MD note next treatment (Dr. Rolena Infante)    PT [place] and Agree with Plan of Care Patient           Patient will benefit from skilled therapeutic intervention in order to improve the following deficits and impairments:  Decreased balance, Decreased activity tolerance, Decreased range of motion, Difficulty walking, Pain, Decreased strength, Decreased mobility, Postural dysfunction, Decreased scar mobility  Visit Diagnosis: Acute bilateral low back pain with right-sided sciatica  Abnormal posture  Muscle weakness (generalized)     Problem List Patient Active Problem List   Diagnosis Date Noted   S/P lumbar fusion 05/01/2020   Hypertension 11/20/2019   Family history of early CAD 07/15/2017   Elevated blood pressure reading in office without diagnosis of hypertension 07/15/2017   Obese 03/27/2015  S/P left TKA 03/26/2015   S/P knee replacement 03/26/2015   Colitis, acute 12/01/2014   Weight loss, unintentional 12/01/2014   Adnexal cyst 12/01/2014   Elevated fasting glucose 12/01/2014   CAD (coronary artery disease) 12/01/2014   Osteoarthritis 12/01/2014   Vasovagal attack 02/28/2013   Hyperlipidemia 02/28/2013   Standley Brooking, PTA 07/30/20 2:01 PM   [facility] [place], [place], [address] Phone: [phone]   Fax:  [phone]  Name: REYES FIFIELD MRN: [id number] Date of Birth: 07-Oct-1951

## 2020-08-01 ENCOUNTER — Encounter: Payer: Medicare PPO | Admitting: Physical Therapy

## 2020-10-25 ENCOUNTER — Other Ambulatory Visit: Payer: Self-pay | Admitting: Interventional Cardiology

## 2020-11-19 ENCOUNTER — Other Ambulatory Visit: Payer: Self-pay | Admitting: Interventional Cardiology

## 2020-12-13 ENCOUNTER — Other Ambulatory Visit: Payer: Self-pay | Admitting: Interventional Cardiology

## 2021-01-04 ENCOUNTER — Other Ambulatory Visit: Payer: Self-pay | Admitting: Interventional Cardiology

## 2021-01-15 ENCOUNTER — Other Ambulatory Visit: Payer: Self-pay | Admitting: Interventional Cardiology

## 2021-02-03 ENCOUNTER — Other Ambulatory Visit: Payer: Self-pay | Admitting: Interventional Cardiology

## 2021-03-05 ENCOUNTER — Other Ambulatory Visit: Payer: Self-pay

## 2021-03-05 ENCOUNTER — Other Ambulatory Visit: Payer: Self-pay | Admitting: Interventional Cardiology

## 2021-03-05 ENCOUNTER — Ambulatory Visit: Payer: Medicare PPO | Admitting: Interventional Cardiology

## 2021-03-05 ENCOUNTER — Encounter: Payer: Self-pay | Admitting: Interventional Cardiology

## 2021-03-05 VITALS — BP 132/92 | HR 79 | Ht 66.0 in | Wt 197.4 lb

## 2021-03-05 DIAGNOSIS — I2510 Atherosclerotic heart disease of native coronary artery without angina pectoris: Secondary | ICD-10-CM | POA: Diagnosis not present

## 2021-03-05 DIAGNOSIS — I10 Essential (primary) hypertension: Secondary | ICD-10-CM

## 2021-03-05 DIAGNOSIS — E782 Mixed hyperlipidemia: Secondary | ICD-10-CM | POA: Diagnosis not present

## 2021-03-05 DIAGNOSIS — Z8249 Family history of ischemic heart disease and other diseases of the circulatory system: Secondary | ICD-10-CM | POA: Diagnosis not present

## 2021-03-05 NOTE — Patient Instructions (Signed)
Medication Instructions:  Your physician recommends that you continue on your current medications as directed. Please refer to the Current Medication list given to you today.  *If you need a refill on your cardiac medications before your next appointment, please call your pharmacy*   Lab Work: Your physician recommends that you return for lab work on May 17.  CMET, Lipid and CBC.  This will be fasting.  The lab opens at 7:30 AM  If you have labs (blood work) drawn today and your tests are completely normal, you will receive your results only by: Marland Kitchen MyChart Message (if you have MyChart) OR . A paper copy in the mail If you have any lab test that is abnormal or we need to change your treatment, we will call you to review the results.   Testing/Procedures: none   Follow-Up: At [facility], you and your health needs are our priority.  As part of our continuing mission to provide you with exceptional heart care, we have created designated Provider Care Teams.  These Care Teams include your primary Cardiologist (physician) and Advanced Practice Providers (APPs -  Physician Assistants and Nurse Practitioners) who all work together to provide you with the care you need, when you need it.  We recommend signing up for the patient portal called "MyChart".  Sign up information is provided on this After Visit Summary.  MyChart is used to connect with patients for Virtual Visits (Telemedicine).  Patients are able to view lab/test results, encounter notes, upcoming appointments, etc.  Non-urgent messages can be sent to your provider as well.   To learn more about what you can do with MyChart, go to [email or web address].    Your next appointment:   12 month(s)  The format for your next appointment:   In Person  Provider:   You may see Larae Grooms, MD or one of the following Advanced Practice Providers on your designated Care Team:    Melina Copa, PA-C  Ermalinda Barrios, PA-C    Other  Instructions  High-Fiber Eating Plan Fiber, also called dietary fiber, is a type of carbohydrate. It is found foods such as fruits, vegetables, whole grains, and beans. A high-fiber diet can have many health benefits. Your health care provider may recommend a high-fiber diet to help:  Prevent constipation. Fiber can make your bowel movements more regular.  Lower your cholesterol.  Relieve the following conditions: ? Inflammation of veins in the anus (hemorrhoids). ? Inflammation of specific areas of the digestive tract (uncomplicated diverticulosis). ? A problem of the large intestine, also called the colon, that sometimes causes pain and diarrhea (irritable bowel syndrome, or IBS).  Prevent overeating as part of a weight-loss plan.  Prevent heart disease, type 2 diabetes, and certain cancers. What are tips for following this plan? Reading food labels  Check the nutrition facts label on food products for the amount of dietary fiber. Choose foods that have 5 grams of fiber or more per serving.  The goals for recommended daily fiber intake include: ? Men (age 55 or younger): 34-38 g. ? Men (over age 73): 28-34 g. ? Women (age 84 or younger): 25-28 g. ? Women (over age 59): 22-25 g. Your daily fiber goal is _____________ g.   Shopping  Choose whole fruits and vegetables instead of processed forms, such as apple juice or applesauce.  Choose a wide variety of high-fiber foods such as avocados, lentils, oats, and kidney beans.  Read the nutrition facts label of the foods you  choose. Be aware of foods with added fiber. These foods often have high sugar and sodium amounts per serving. Cooking  Use whole-grain flour for baking and cooking.  Cook with brown rice instead of white rice. Meal planning  Start the day with a breakfast that is high in fiber, such as a cereal that contains 5 g of fiber or more per serving.  Eat breads and cereals that are made with whole-grain flour  instead of refined flour or white flour.  Eat brown rice, bulgur wheat, or millet instead of white rice.  Use beans in place of meat in soups, salads, and pasta dishes.  Be sure that half of the grains you eat each day are whole grains. General information  You can get the recommended daily intake of dietary fiber by: ? Eating a variety of fruits, vegetables, grains, nuts, and beans. ? Taking a fiber supplement if you are not able to take in enough fiber in your diet. It is better to get fiber through food than from a supplement.  Gradually increase how much fiber you consume. If you increase your intake of dietary fiber too quickly, you may have bloating, cramping, or gas.  Drink plenty of water to help you digest fiber.  Choose high-fiber snacks, such as berries, raw vegetables, nuts, and popcorn. What foods should I eat? Fruits Berries. Pears. Apples. Oranges. Avocado. Prunes and raisins. Dried figs. Vegetables Sweet potatoes. Spinach. Kale. Artichokes. Cabbage. Broccoli. Cauliflower. Green peas. Carrots. Squash. Grains Whole-grain breads. Multigrain cereal. Oats and oatmeal. Brown rice. Barley. Bulgur wheat. [place]. Quinoa. Bran muffins. Popcorn. Rye wafer crackers. Meats and other proteins Navy beans, kidney beans, and pinto beans. Soybeans. Split peas. Lentils. Nuts and seeds. Dairy Fiber-fortified yogurt. Beverages Fiber-fortified soy milk. Fiber-fortified orange juice. Other foods Fiber bars. The items listed above may not be a complete list of recommended foods and beverages. Contact a dietitian for more information. What foods should I avoid? Fruits Fruit juice. Cooked, strained fruit. Vegetables Fried potatoes. Canned vegetables. Well-cooked vegetables. Grains White bread. Pasta made with refined flour. White rice. Meats and other proteins Fatty cuts of meat. Fried chicken or fried fish. Dairy Milk. Yogurt. Cream cheese. Sour cream. Fats and  oils Butters. Beverages Soft drinks. Other foods Cakes and pastries. The items listed above may not be a complete list of foods and beverages to avoid. Talk with your dietitian about what choices are best for you. Summary  Fiber is a type of carbohydrate. It is found in foods such as fruits, vegetables, whole grains, and beans.  A high-fiber diet has many benefits. It can help to prevent constipation, lower blood cholesterol, aid weight loss, and reduce your risk of heart disease, diabetes, and certain cancers.  Increase your intake of fiber gradually. Increasing fiber too quickly may cause cramping, bloating, and gas. Drink plenty of water while you increase the amount of fiber you consume.  The best sources of fiber include whole fruits and vegetables, whole grains, nuts, seeds, and beans. This information is not intended to replace advice given to you by your health care provider. Make sure you discuss any questions you have with your health care provider. Document Revised: 02/15/2020 Document Reviewed: 02/15/2020 Elsevier Patient Education  2021 [employer].

## 2021-03-05 NOTE — Progress Notes (Signed)
Cardiology Office Note   Date:  03/05/2021   ID:  Carol, Cox 1951/09/20, MRN [id number]  PCP:  Carol Sacramento, MD    No chief complaint on file.  CAD  Wt Readings from Last 3 Encounters:  03/05/21 197 lb 6.4 oz (89.5 kg)  05/02/20 195 lb 1.7 oz (88.5 kg)  04/23/20 198 lb 3.2 oz (89.9 kg)       History of Present Illness: Carol Cox is a 70 y.o. female  who has a strong family history of coronary artery diseaseincluding her Dad at age 8, a sister at 62 with MI, maternal uncle with MI in his 79s. She had chest discomfort in 2014. This resulted in a heart catheterization which showed mild, nonobstructive coronary artery disease. She was thought to have non-cardiac chest pain.  SHe has had knee surgery a few years ago and then shoulder surgery more recently. She has ankle tendinitis as well.Stationary bike has worsened knee pain.  Over the past few years, she has developed worsening spinal stenosis and had surgery.  Walking has been limited.   Denies : Chest pain. Dizziness. Leg edema. Nitroglycerin use. Orthopnea. Palpitations. Paroxysmal nocturnal dyspnea. Shortness of breath. Syncope.   She still has back pain. Uses an elliptical.  She walks sometimes as well.   Home BP readings 130/80s.      Past Medical History:  Diagnosis Date  . Allergy    seasonal  . Arthritis    "knee, left hip, ankles"   . Borderline glaucoma   . CAD (coronary artery disease)    Mild nonobstructive CAD by cath 02/06/13 (25% mid LAD), normal EF  . Chronic low back pain   . Colitis   . Coronary arteriosclerosis   . DDD (degenerative disc disease), lumbar   . Degeneration of intervertebral disc of lumbar region   . Family history of adverse reaction to anesthesia    mother has problems with nausea and vomiting   . Glaucoma   . History of colitis    02/ 2016  acute infectious colitis-- resolved  . History of total knee arthroplasty   . Hyperglycemia   .  Hyperlipidemia   . Left ovarian cyst   . Low back pain    with bilateral hip radiation   . Lumbar radiculopathy   . Lumbar spondylolysis   . Obesity   . Osteoporosis    ospenia of hips  . Pain in joint of left shoulder   . Pain of right hip joint   . PONV (postoperative nausea and vomiting)    SEVERE    Past Surgical History:  Procedure Laterality Date  . ABDOMINAL EXPOSURE N/A 05/01/2020   Procedure: ABDOMINAL EXPOSURE;  Surgeon: Rosetta Posner, MD;  Location: [place];  Service: Vascular;  Laterality: N/A;  . ANTERIOR LAT LUMBAR FUSION N/A 05/01/2020   Procedure: ANTERIOR LATERAL LUMBAR FUSION L4-S1;  Surgeon: Melina Schools, MD;  Location: [facility];  Service: Orthopedics;  Laterality: N/A;  4.5 hrs Dr. Donnetta Hutching to do approach tap block with exparel  . CARPAL TUNNEL RELEASE Bilateral 1990's  . KNEE ARTHROSCOPY W/ MENISCECTOMY Left 07/2014  . LEFT HEART CATHETERIZATION WITH CORONARY ANGIOGRAM N/A 02/06/2013   Procedure: LEFT HEART CATHETERIZATION WITH CORONARY ANGIOGRAM;  Surgeon: [name] Hampshire, MD;  Location: [facility];  Service: Cardiovascular;  Laterality: N/A;  non-obstruction 25% mLAD,  normal LVF , ef 65-70%  . left knee meniscus surgery     . NASAL SINUS  SURGERY    . ovarian cyst     left ovary  . ROTATOR CUFF REPAIR Right 10/2016  . TOTAL KNEE ARTHROPLASTY Left 03/26/2015   Procedure: LEFT TOTAL KNEE ARTHROPLASTY;  Surgeon: Paralee Cancel, MD;  Location: [facility];  Service: Orthopedics;  Laterality: Left;     Current Outpatient Medications  Medication Sig Dispense Refill  . aspirin EC 81 MG tablet Take 81 mg by mouth daily.    . calcium-vitamin D (OSCAL WITH D) 500-200 MG-UNIT TABS tablet Take by mouth.    . Coenzyme Q10 (COQ10 PO) Take 1 capsule by mouth daily.    . dorzolamide-timolol (COSOPT) 22.3-6.8 MG/ML ophthalmic solution Place 1 drop into both eyes in the morning and at bedtime.    Marland Kitchen ezetimibe-simvastatin (VYTORIN) 10-40 MG tablet TAKE 1 TABLET BY MOUTH EVERYDAY AT  BEDTIME (Patient taking differently: Take 1 tablet by mouth 3 (three) times a week.) 90 tablet 2  . lisinopril (ZESTRIL) 20 MG tablet Take 1 tablet (20 mg total) by mouth daily. Please keep upcoming appt for future refills. 90 tablet 0  . psyllium (METAMUCIL) 58.6 % powder Take 1 packet by mouth as needed.    . Travoprost, BAK Free, (TRAVATAN) 0.004 % SOLN ophthalmic solution Place 1 drop into both eyes at bedtime.      No current facility-administered medications for this visit.    Allergies:   Atorvastatin and Rosuvastatin    Social History:  The patient  reports that she has never smoked. She has never used smokeless tobacco. She reports that she does not drink alcohol and does not use drugs.   Family History:  The patient's family history includes CAD in her sister; CAD (age of onset: 15) in her maternal uncle; CAD (age of onset: 73) in her maternal uncle; CVA in her mother; Heart attack in her father; Heart attack (age of onset: 83) in her maternal uncle; Heart attack (age of onset: 71) in her maternal uncle; Heart attack (age of onset: 49) in her sister; Sudden death (age of onset: 15) in her father.    ROS:  Please see the history of present illness.   Otherwise, review of systems are positive for back pain.   All other systems are reviewed and negative.    PHYSICAL EXAM: VS:  BP (!) 132/92   Pulse 79   Ht 5' 6"  (1.676 m)   Wt 197 lb 6.4 oz (89.5 kg)   LMP  (LMP Unknown)   SpO2 94%   BMI 31.86 kg/m  , BMI Body mass index is 31.86 kg/m. GEN: Well nourished, well developed, in no acute distress  HEENT: normal  Neck: no JVD, carotid bruits, or masses Cardiac: RRR; no murmurs, rubs, or gallops,no edema  Respiratory:  clear to auscultation bilaterally, normal work of breathing GI: soft, nontender, nondistended, + BS MS: no deformity or atrophy  Skin: warm and dry, no rash Neuro:  Strength and sensation are intact Psych: euthymic mood, full affect   EKG:   The ekg ordered  today demonstrates NSR, no ST changes   Recent Labs: 04/23/2020: BUN 25; Potassium 4.4; Sodium 138 05/02/2020: Creatinine, Ser 1.12; Hemoglobin 10.6; Platelets 162   Lipid Panel    Component Value Date/Time   CHOL 153 11/06/2019 1014   TRIG 145 11/06/2019 1014   HDL 51 11/06/2019 1014   CHOLHDL 3.0 11/06/2019 1014   CHOLHDL 4 04/22/2015 0909   VLDL 35.4 04/22/2015 0909   LDLCALC 77 11/06/2019 1014   LDLDIRECT 106.3  05/09/2013 5027     Other studies Reviewed: Additional studies/ records that were reviewed today with results demonstrating: .   ASSESSMENT AND PLAN:  1. CAD: No sx of angina. COntinue secondary prevention.  2. Hyperlipidemia: Needs recheck.  Whole food, plant based diet.   3. Family h/o CAD: no change. Many ancestors who have had CAD in their 25s.  4. HTN: Lisinopril was started in January 2021.  Tolerating her meds well.     Current medicines are reviewed at length with the patient today.  The patient concerns regarding her medicines were addressed.  The following changes have been made:  No change  Labs/ tests ordered today include:   Orders Placed This Encounter  Procedures  . Comp Met (CMET)  . Lipid Profile  . CBC  . EKG 12-Lead    Recommend 150 minutes/week of aerobic exercise Low fat, low carb, high fiber diet recommended  Disposition:   FU in 1 year   Signed, Larae Grooms, MD  03/05/2021 4:55 PM    [facility] [address], [place], [place]  [address] Phone: [phone]; Fax: [phone]

## 2021-03-11 ENCOUNTER — Other Ambulatory Visit: Payer: Self-pay

## 2021-03-11 ENCOUNTER — Other Ambulatory Visit: Payer: Medicare PPO

## 2021-03-11 DIAGNOSIS — Z8249 Family history of ischemic heart disease and other diseases of the circulatory system: Secondary | ICD-10-CM

## 2021-03-11 DIAGNOSIS — I10 Essential (primary) hypertension: Secondary | ICD-10-CM

## 2021-03-11 DIAGNOSIS — I2510 Atherosclerotic heart disease of native coronary artery without angina pectoris: Secondary | ICD-10-CM

## 2021-03-11 DIAGNOSIS — E782 Mixed hyperlipidemia: Secondary | ICD-10-CM

## 2021-03-11 LAB — CBC
Hematocrit: 43.3 % (ref 34.0–46.6)
Hemoglobin: 14.3 g/dL (ref 11.1–15.9)
MCH: 30.9 pg (ref 26.6–33.0)
MCHC: 33 g/dL (ref 31.5–35.7)
MCV: 94 fL (ref 79–97)
Platelets: 244 10*3/uL (ref 150–450)
RBC: 4.63 x10E6/uL (ref 3.77–5.28)
RDW: 12.8 % (ref 11.7–15.4)
WBC: 6.5 10*3/uL (ref 3.4–10.8)

## 2021-03-11 LAB — COMPREHENSIVE METABOLIC PANEL
ALT: 23 IU/L (ref 0–32)
AST: 26 IU/L (ref 0–40)
Albumin/Globulin Ratio: 1.6 (ref 1.2–2.2)
Albumin: 4.6 g/dL (ref 3.8–4.8)
Alkaline Phosphatase: 65 IU/L (ref 44–121)
BUN/Creatinine Ratio: 25 (ref 12–28)
BUN: 28 mg/dL — ABNORMAL HIGH (ref 8–27)
Bilirubin Total: 0.4 mg/dL (ref 0.0–1.2)
CO2: 23 mmol/L (ref 20–29)
Calcium: 10.2 mg/dL (ref 8.7–10.3)
Chloride: 104 mmol/L (ref 96–106)
Creatinine, Ser: 1.14 mg/dL — ABNORMAL HIGH (ref 0.57–1.00)
Globulin, Total: 2.8 g/dL (ref 1.5–4.5)
Glucose: 98 mg/dL (ref 65–99)
Potassium: 4.9 mmol/L (ref 3.5–5.2)
Sodium: 141 mmol/L (ref 134–144)
Total Protein: 7.4 g/dL (ref 6.0–8.5)
eGFR: 52 mL/min/{1.73_m2} — ABNORMAL LOW (ref >59)

## 2021-03-11 LAB — LIPID PANEL
Chol/HDL Ratio: 3.9 ratio (ref 0.0–4.4)
Cholesterol, Total: 193 mg/dL (ref 100–199)
HDL: 49 mg/dL (ref >39)
LDL Chol Calc (NIH): 110 mg/dL — ABNORMAL HIGH (ref 0–99)
Triglycerides: 194 mg/dL — ABNORMAL HIGH (ref 0–149)
VLDL Cholesterol Cal: 34 mg/dL (ref 5–40)

## 2021-03-12 ENCOUNTER — Telehealth: Payer: Self-pay | Admitting: Interventional Cardiology

## 2021-03-12 NOTE — Telephone Encounter (Signed)
Follow Up;    Pt is returning call, concerningg her lab results.

## 2021-03-12 NOTE — Telephone Encounter (Signed)
See result note.  

## 2021-03-14 ENCOUNTER — Telehealth: Payer: Self-pay | Admitting: *Deleted

## 2021-03-14 DIAGNOSIS — E782 Mixed hyperlipidemia: Secondary | ICD-10-CM

## 2021-03-14 NOTE — Telephone Encounter (Signed)
Dr Irish Lack would like patient to be seen in Lipid clinic in [facility] office.  I placed call to patient to schedule this appointment. Left message to call office.

## 2021-03-17 NOTE — Telephone Encounter (Signed)
Patient has appointment on 04/14/21 in lipid clinic

## 2021-04-14 ENCOUNTER — Ambulatory Visit (INDEPENDENT_AMBULATORY_CARE_PROVIDER_SITE_OTHER): Payer: Medicare PPO | Admitting: Pharmacy Technician

## 2021-04-14 ENCOUNTER — Other Ambulatory Visit: Payer: Self-pay

## 2021-04-14 DIAGNOSIS — E782 Mixed hyperlipidemia: Secondary | ICD-10-CM

## 2021-04-14 NOTE — Progress Notes (Signed)
Patient ID: GENESYS COGGESHALL                 DOB: 09-Jul-1951                    MRN: [id number]     HPI: Carol Cox is a 70 y.o. female patient referred to lipid clinic by Dr. Irish Lack. PMH is significant for CAD (mild nonobstructive CAD by cath 01/2013 w/ 25% mid LAD and normal EF), strong family history of premature CAD, non-cardiac chest pain, arthritis, HTN, HLD, obesity, and osteoporosis.  Last seen in clinic by Dr. Irish Lack on 5/11. Lipid panel revealed TC 193 TG 194 HDL 49 LDL 110. Patient reported she was taking ezetimibe-simvastatin 10-40 mg 2-3 times weekly and was still experiencing muscle pain in her legs. Patient was referred to lipid clinic.   Patient presents to lipid clinic for initial visit. Patient reports she tried atorvastatin and rosuvastatin many years ago but does not remember the dose or if she tried taking fewer times per week. Patient endorses muscle pain in her thighs that has continued despite taking her ezetimibe-simvastatin 2-3 times per week. Patient is aware of needing to make lifestyle changes and is focused on improving her diet with low fat foods and finding exercise that does not exacerbate her back pain such as short walks and elliptical training.   Current Medications: ezetimibe-simvastatin 10-40 mg 2-3 times weekly Intolerances: rosuvastatin (leg pain), atorvastatin (leg pain) Risk Factors: CAD, extensive family hx of early CAD LDL goal: <70  Diet: endorses healthy diet changes (lean meats, low fat foods)  Exercise: occasional walking and elliptical use (limited by back pain, spinal stenosis, knee pain, ankle tendinitis)  Family History: CAD in her sister; CAD (age of onset: 6) in her maternal uncle; CAD (age of onset: 15) in her maternal uncle; CVA in her mother; Heart attack in her father; Heart attack (age of onset: 37) in her maternal uncle; Heart attack (age of onset: 7) in her maternal uncle; Heart attack (age of onset: 63) in her sister; Sudden  death (age of onset: 41) in her father  Social History: never smoked, denies alcohol or drug use  Labs: lipid panel (03/11/2021): TC 193 TG 194 HDL 49 LDL 110 (on ezetimibe-simvastatin 10-40 mg 2-3 times weekly)  Past Medical History:  Diagnosis Date   Allergy    seasonal   Arthritis    "knee, left hip, ankles"    Borderline glaucoma    CAD (coronary artery disease)    Mild nonobstructive CAD by cath 02/06/13 (25% mid LAD), normal EF   Chronic low back pain    Colitis    Coronary arteriosclerosis    DDD (degenerative disc disease), lumbar    Degeneration of intervertebral disc of lumbar region    Family history of adverse reaction to anesthesia    mother has problems with nausea and vomiting    Glaucoma    History of colitis    02/ 2016  acute infectious colitis-- resolved   History of total knee arthroplasty    Hyperglycemia    Hyperlipidemia    Left ovarian cyst    Low back pain    with bilateral hip radiation    Lumbar radiculopathy    Lumbar spondylolysis    Obesity    Osteoporosis    ospenia of hips   Pain in joint of left shoulder    Pain of right hip joint    PONV (postoperative nausea and vomiting)  SEVERE    Current Outpatient Medications on File Prior to Visit  Medication Sig Dispense Refill   aspirin EC 81 MG tablet Take 81 mg by mouth daily.     calcium-vitamin D (OSCAL WITH D) 500-200 MG-UNIT TABS tablet Take by mouth.     Coenzyme Q10 (COQ10 PO) Take 1 capsule by mouth daily.     dorzolamide-timolol (COSOPT) 22.3-6.8 MG/ML ophthalmic solution Place 1 drop into both eyes in the morning and at bedtime.     ezetimibe-simvastatin (VYTORIN) 10-40 MG tablet TAKE 1 TABLET BY MOUTH EVERYDAY AT BEDTIME (Patient taking differently: Take 1 tablet by mouth 3 (three) times a week.) 90 tablet 2   lisinopril (ZESTRIL) 20 MG tablet Take 1 tablet (20 mg total) by mouth daily. Please keep upcoming appt for future refills. 90 tablet 0   psyllium (METAMUCIL) 58.6 % powder  Take 1 packet by mouth as needed.     Travoprost, BAK Free, (TRAVATAN) 0.004 % SOLN ophthalmic solution Place 1 drop into both eyes at bedtime.      No current facility-administered medications on file prior to visit.    Allergies  Allergen Reactions   Atorvastatin Other (See Comments)    Leg pain   Rosuvastatin Other (See Comments)    Leg pain    Assessment/Plan:  1. Hyperlipidemia - Patient's LDL is above goal of <70 on ezetimibe-simvastatin 10-40 mg 2-3 times weekly and is still experiencing muscle pain despite the reduced frequency. Encouraged patient to stop ezetimibe-simvastatin to see if muscle pain is relieved. We discussed PCSK9 inhibitors including Repatha and Praluent and patient was agreeable to trying. Patient still has [employer] on file and [facility] [place]. We will work on getting approval for the medications and follow up with patient over the phone and repeat a lipid panel in 3 months.    Romilda Garret, PharmD PGY1 Acute Care Pharmacy Resident 04/14/2021 9:35 AM

## 2021-04-14 NOTE — Patient Instructions (Addendum)
It was great seeing you today!  Your LDL is above your goal of <70 and you are experiencing some muscle aches on your current statin. Stop taking ezetimibe-simvastatin and let us know if your symptoms improve.   We discussed a new medication called a PCSK9 inhibitor. Your insurance may prefer either Repatha or Praluent. It is an injection into the tissue of your abdomen or upper thigh once every 2 weeks. We will work on getting this ready and call you then. We will schedule a follow up lab in 3 months.

## 2021-04-15 ENCOUNTER — Telehealth: Payer: Self-pay | Admitting: Pharmacist

## 2021-04-15 MED ORDER — REPATHA SURECLICK 140 MG/ML ~~LOC~~ SOAJ: 1.0000 "pen " | SUBCUTANEOUS | 3 refills | Status: DC

## 2021-04-15 MED ORDER — REPATHA SURECLICK 140 MG/ML ~~LOC~~ SOAJ: 1.0000 "pen " | SUBCUTANEOUS | 11 refills | Status: DC

## 2021-04-15 NOTE — Telephone Encounter (Signed)
Repatha approved through 10/11/21. $40/ 1 month $80/ 3 months  [facility] to order  Called patient and left VM for her to call back

## 2021-04-15 NOTE — Telephone Encounter (Signed)
Pt returned call and is aware Repatha has been approved. I sent in new rx for Repatha as a 3 month instead of a 1 month supply for cost savings. Confirmed follow up lab date with pt. She will call with any concerns.

## 2021-05-09 ENCOUNTER — Other Ambulatory Visit: Payer: Self-pay | Admitting: Interventional Cardiology

## 2021-07-28 ENCOUNTER — Other Ambulatory Visit: Payer: Medicare PPO | Admitting: *Deleted

## 2021-07-28 ENCOUNTER — Other Ambulatory Visit: Payer: Self-pay

## 2021-07-28 DIAGNOSIS — E782 Mixed hyperlipidemia: Secondary | ICD-10-CM

## 2021-07-28 LAB — HEPATIC FUNCTION PANEL
ALT: 26 IU/L (ref 0–32)
AST: 27 IU/L (ref 0–40)
Albumin: 4.6 g/dL (ref 3.8–4.8)
Alkaline Phosphatase: 62 IU/L (ref 44–121)
Bilirubin Total: 0.3 mg/dL (ref 0.0–1.2)
Bilirubin, Direct: <0.1 mg/dL (ref 0.00–0.40)
Total Protein: 7 g/dL (ref 6.0–8.5)

## 2021-07-28 LAB — LIPID PANEL
Chol/HDL Ratio: 4.5 ratio — ABNORMAL HIGH (ref 0.0–4.4)
Cholesterol, Total: 205 mg/dL — ABNORMAL HIGH (ref 100–199)
HDL: 46 mg/dL (ref >39)
LDL Chol Calc (NIH): 116 mg/dL — ABNORMAL HIGH (ref 0–99)
Triglycerides: 248 mg/dL — ABNORMAL HIGH (ref 0–149)
VLDL Cholesterol Cal: 43 mg/dL — ABNORMAL HIGH (ref 5–40)

## 2021-07-30 ENCOUNTER — Telehealth: Payer: Self-pay | Admitting: Pharmacist

## 2021-07-30 DIAGNOSIS — R634 Abnormal weight loss: Secondary | ICD-10-CM

## 2021-07-30 DIAGNOSIS — E782 Mixed hyperlipidemia: Secondary | ICD-10-CM

## 2021-07-30 MED ORDER — ROSUVASTATIN CALCIUM 5 MG PO TABS: 5.0000 mg | ORAL_TABLET | ORAL | 3 refills | Status: DC

## 2021-07-30 NOTE — Telephone Encounter (Signed)
Spoke to patient about her labs. LDL did not improve. Patient had stopped taking Vytroin. She was only taking 2-3 times a day. This explains why we saw no decrease in LDL. Patient willing to try rosvuastatin 5mg  three times a week. Recheck labs 12/14. If LDL still high, will add zetia daily.

## 2021-08-27 ENCOUNTER — Telehealth: Payer: Self-pay | Admitting: Interventional Cardiology

## 2021-08-27 NOTE — Telephone Encounter (Signed)
Attempted to call the pt back and phone kept ringing then someone picked up but never answered or started speaking.

## 2021-08-27 NOTE — Telephone Encounter (Signed)
Pt c/o medication issue:  1. Name of Medication:   rosuvastatin (CRESTOR) 5 MG tablet    2. How are you currently taking this medication (dosage and times per day)? Take 1 tablet (5 mg total) by mouth 3 (three) times a week.  3. Are you having a reaction (difficulty breathing--STAT)? Thigh pain  4. What is your medication issue?    Crestor is causing thigh pain... pt doesnt want to continue taking. Please advise further.

## 2021-08-28 NOTE — Telephone Encounter (Signed)
Spoke with and is not tolerating Crestor 3 x a week Pt did not take med yesterday  Per pt gets up 7-8 times a night to walk around as this helps with the pain somewhat Will forward to [facility] PharmD./cy

## 2021-09-01 MED ORDER — EZETIMIBE 10 MG PO TABS: 10.0000 mg | ORAL_TABLET | Freq: Every day | ORAL | 11 refills | Status: DC

## 2021-09-01 NOTE — Telephone Encounter (Signed)
Patient has been off rosuvastatin for 1 week and leg pain is better. Will stop rosuvastatin 5mg  three times a week and start zetia 10mg  daily. Recheck labs as planned in Dec.

## 2021-09-14 IMAGING — XA DG DISKOGRAPHY LUMBAR S+I
2 series · 2 of 2 positions shown · non-contrast
Comparison: MRI 10/21/2019

CLINICAL DATA: Low back pain. Leg pain right worse than left.
Clinical request for L5-S1 diagnostic disc injection.

EXAM:
DIAGNOSTIC LUMBAR DISK INJECTIONS

[Series 1: ortho standard · 1 of 1 slices shown (1 of 2)]
[im 1/1]
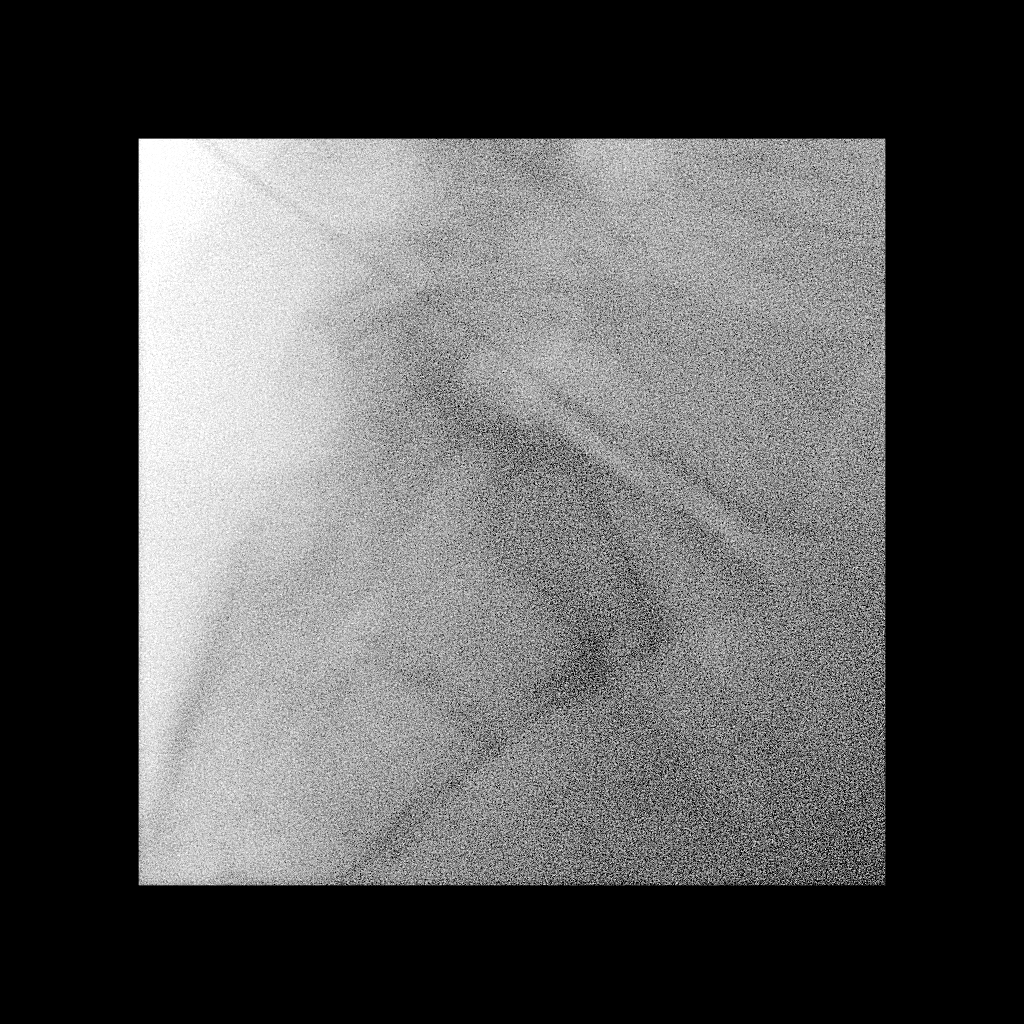

[Series 2: ortho standard · 1 of 1 slices shown (2 of 2)]
[im 1/1]
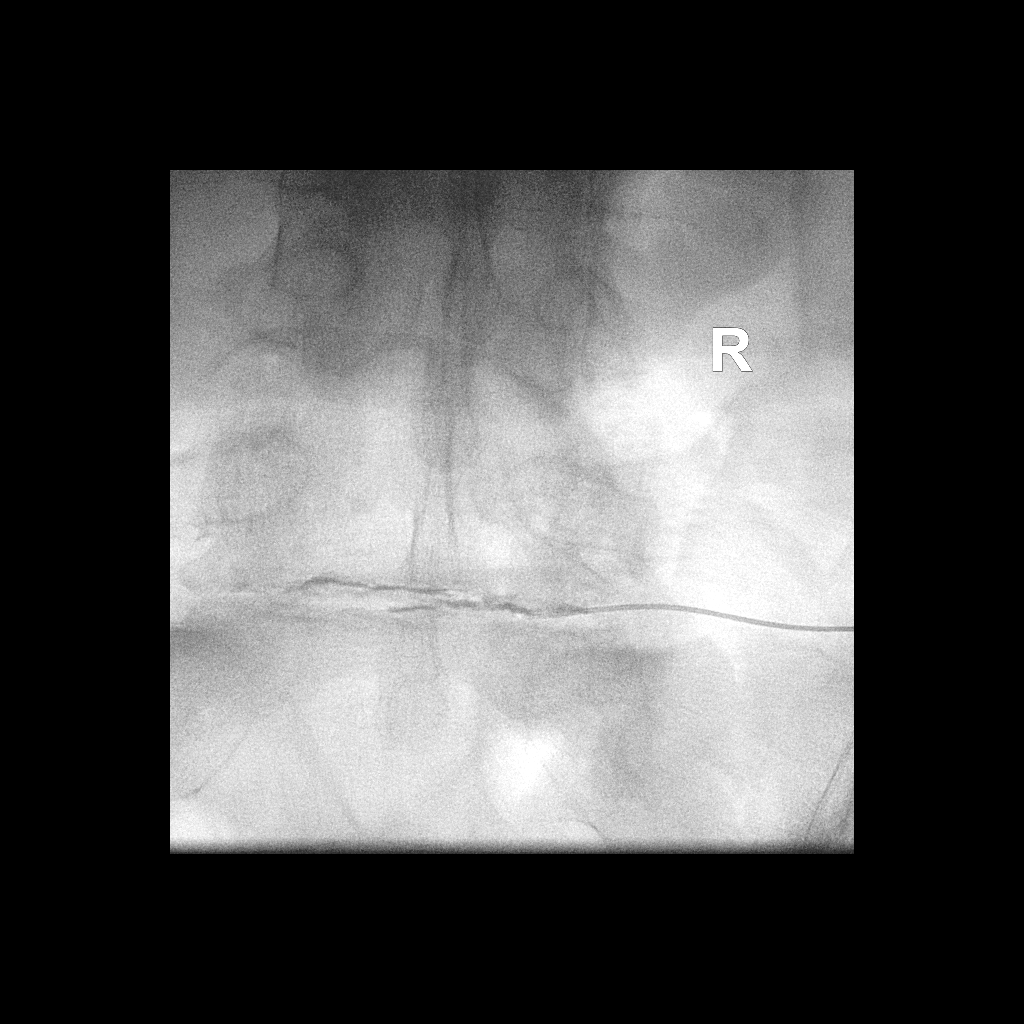

[2 of 2 positions shown; findings below may reference images not displayed]

PROCEDURE:
The procedure was discussed in depth with the patient including the
potential risk of infection. She received 1 gram of cefazolin
intravenously prior to the procedure. Low back was scrubbed with
Betadine and draped in sterile fashion. Local anesthesia was carried
[DATE]% lidocaine. 20 cm 22 gauge Chiba needle was directed
into the L5-S1 disc space. Injection of a small amount of Isovue 200
shows good spread throughout the disc space. The disc shows diffuse
disruption. 1.25 cc 0.5% bupivacaine were injected into the disc.
The injection did seem to result in concordant low back pain.
Procedure was well-tolerated. The patient was discharged 20 minutes
after the injection, feeling some relief of her symptoms with
sitting. She was instructed to keep a pain log for the next 6 hours
for use in discussion at her next clinical follow-up.

FLUOROSCOPY TIME:  1 minutes 16 seconds. 89.13 micro gray meter
squared
IMPRESSION: Right L5-S1 bupivacaine injection. Concordant pain during the
injection. Moderate relief at the time of discharge.

## 2021-10-08 ENCOUNTER — Other Ambulatory Visit: Payer: Medicare PPO | Admitting: *Deleted

## 2021-10-08 ENCOUNTER — Other Ambulatory Visit: Payer: Self-pay

## 2021-10-08 DIAGNOSIS — E782 Mixed hyperlipidemia: Secondary | ICD-10-CM

## 2021-10-08 LAB — LIPID PANEL
Chol/HDL Ratio: 3.3 ratio (ref 0.0–4.4)
Cholesterol, Total: 160 mg/dL (ref 100–199)
HDL: 49 mg/dL (ref >39)
LDL Chol Calc (NIH): 80 mg/dL (ref 0–99)
Triglycerides: 186 mg/dL — ABNORMAL HIGH (ref 0–149)
VLDL Cholesterol Cal: 31 mg/dL (ref 5–40)

## 2021-10-13 ENCOUNTER — Telehealth: Payer: Self-pay | Admitting: Pharmacist

## 2021-10-13 NOTE — Telephone Encounter (Signed)
I called pt and reviewed her lab results. LDL is improved to 90 TG are better, but still slightly high Patient is on Repatha 140mg  q 14 days and zetia and doing well. We discussed changing zetia to nexlizet or adding Icosapent ethyl. Patient admits that she could do better on her diet. Would like to really work on that after the holiday. Will recheck lipids in about 5 months.

## 2022-03-02 ENCOUNTER — Telehealth: Payer: Self-pay

## 2022-03-02 DIAGNOSIS — E785 Hyperlipidemia, unspecified: Secondary | ICD-10-CM

## 2022-03-02 NOTE — Telephone Encounter (Signed)
LMOM PT TO schedule fasting labs and also a md appt as well and will route this msg to me to follow up on a couple more call attempts.  ?

## 2022-03-02 NOTE — Telephone Encounter (Signed)
-----   Message from Ramond Dial, [place] sent at 03/02/2022  7:12 AM EDT ----- ?Please set up lipids and apo B- cc me, thanks ?----- Message ----- ?From: Ramond Dial, RPH-CPP ?Sent: 03/02/2022  12:00 AM EDT ?To: Ramond Dial, RPH-CPP ? ?Set up lipids ? ? ?

## 2022-03-03 NOTE — Telephone Encounter (Signed)
2nd attempt-lvm °

## 2022-03-04 NOTE — Telephone Encounter (Signed)
3rd lmom to schedule fasting labs ?

## 2022-03-23 ENCOUNTER — Other Ambulatory Visit: Payer: Self-pay | Admitting: Interventional Cardiology

## 2022-03-24 ENCOUNTER — Other Ambulatory Visit: Payer: Medicare PPO | Admitting: *Deleted

## 2022-03-24 DIAGNOSIS — E785 Hyperlipidemia, unspecified: Secondary | ICD-10-CM

## 2022-03-25 LAB — LIPID PANEL
Chol/HDL Ratio: 3.2 ratio (ref 0.0–4.4)
Cholesterol, Total: 159 mg/dL (ref 100–199)
HDL: 49 mg/dL (ref >39)
LDL Chol Calc (NIH): 72 mg/dL (ref 0–99)
Triglycerides: 234 mg/dL — ABNORMAL HIGH (ref 0–149)
VLDL Cholesterol Cal: 38 mg/dL (ref 5–40)

## 2022-03-25 LAB — APOLIPOPROTEIN B: Apolipoprotein B: 89 mg/dL (ref <90)

## 2022-03-26 ENCOUNTER — Telehealth: Payer: Self-pay | Admitting: Pharmacist

## 2022-03-26 NOTE — Telephone Encounter (Signed)
Spoke with patient. These were fasting labs. She states she could do better with her diet and she will work on this. We talked about decreasing sugar and being more aware of added sugars.

## 2022-03-26 NOTE — Telephone Encounter (Signed)
Called pt and LVM for her to call back. Calling patient to review lipid results. LDL-C is better and very close to <70. TG are still high. Will need to confirm if she was fasting. Patient already on one brand name drug. Could consider Vascepa or needs to really focus on diet.

## 2022-04-07 NOTE — Progress Notes (Unsigned)
Cardiology Office Note   Date:  04/08/2022   ID:  Carol Cox, [name] 1951-03-12, MRN [id number]  PCP:  Christain Sacramento, MD    No chief complaint on file.  CAD  Wt Readings from Last 3 Encounters:  04/08/22 196 lb (88.9 kg)  03/05/21 197 lb 6.4 oz (89.5 kg)  05/02/20 195 lb 1.7 oz (88.5 kg)       History of Present Illness: Carol Cox is a 71 y.o. female   who has a strong family history of coronary artery disease including her Dad at age 53, a sister at 30 with MI, maternal uncle with MI in his 2s.  She had chest discomfort in 2014. This resulted in a heart catheterization which showed mild, nonobstructive coronary artery disease. She was thought to have non-cardiac chest pain.    SHe has had knee surgery a few years ago and then shoulder surgery more recently.  She has ankle tendinitis as well.  Stationary bike has worsened knee pain.   Over the past few years, she has developed worsening spinal stenosis and had surgery.  Walking has been limited.   She may need more back surgery in 2023.  Back pain limits walking.  Doing yard work.  Some days pain is less.    Denies : Chest pain. Dizziness. Leg edema. Nitroglycerin use. Orthopnea. Palpitations. Paroxysmal nocturnal dyspnea. Shortness of breath. Syncope.      Past Medical History:  Diagnosis Date   Allergy    seasonal   Arthritis    "knee, left hip, ankles"    Borderline glaucoma    CAD (coronary artery disease)    Mild nonobstructive CAD by cath 02/06/13 (25% mid LAD), normal EF   Chronic low back pain    Colitis    Coronary arteriosclerosis    DDD (degenerative disc disease), lumbar    Degeneration of intervertebral disc of lumbar region    Family history of adverse reaction to anesthesia    mother has problems with nausea and vomiting    Glaucoma    History of colitis    02/ 2016  acute infectious colitis-- resolved   History of total knee arthroplasty    Hyperglycemia    Hyperlipidemia    Left  ovarian cyst    Low back pain    with bilateral hip radiation    Lumbar radiculopathy    Lumbar spondylolysis    Obesity    Osteoporosis    ospenia of hips   Pain in joint of left shoulder    Pain of right hip joint    PONV (postoperative nausea and vomiting)    SEVERE    Past Surgical History:  Procedure Laterality Date   ABDOMINAL EXPOSURE N/A 05/01/2020   Procedure: ABDOMINAL EXPOSURE;  Surgeon: Rosetta Posner, MD;  Location: [facility];  Service: Vascular;  Laterality: N/A;   ANTERIOR LAT LUMBAR FUSION N/A 05/01/2020   Procedure: ANTERIOR LATERAL LUMBAR FUSION L4-S1;  Surgeon: Melina Schools, MD;  Location: [facility];  Service: Orthopedics;  Laterality: N/A;  4.5 hrs Dr. Donnetta Hutching to do approach tap block with exparel   CARPAL TUNNEL RELEASE Bilateral 1990's   KNEE ARTHROSCOPY W/ MENISCECTOMY Left 07/2014   LEFT HEART CATHETERIZATION WITH CORONARY ANGIOGRAM N/A 02/06/2013   Procedure: LEFT HEART CATHETERIZATION WITH CORONARY ANGIOGRAM;  Surgeon: [name] Hampshire, MD;  Location: [facility];  Service: Cardiovascular;  Laterality: N/A;  non-obstruction 25% mLAD,  normal LVF , ef 65-70%  left knee meniscus surgery      NASAL SINUS SURGERY     ovarian cyst     left ovary   ROTATOR CUFF REPAIR Right 10/2016   TOTAL KNEE ARTHROPLASTY Left 03/26/2015   Procedure: LEFT TOTAL KNEE ARTHROPLASTY;  Surgeon: Paralee Cancel, MD;  Location: [facility];  Service: Orthopedics;  Laterality: Left;     Current Outpatient Medications  Medication Sig Dispense Refill   aspirin EC 81 MG tablet Take 81 mg by mouth daily.     Calcium Carbonate-Vit D-Min (CALCIUM 1200 PO) Take 1 capsule by mouth daily.     calcium-vitamin D (OSCAL WITH D) 500-200 MG-UNIT TABS tablet Take 1 tablet by mouth daily.     Cholecalciferol 25 MCG (1000 UT) capsule Take 1 capsule by mouth daily.     dorzolamide-timolol (COSOPT) 22.3-6.8 MG/ML ophthalmic solution Place 1 drop into both eyes in the morning and at bedtime.     Evolocumab (REPATHA  SURECLICK) 409 MG/ML SOAJ Inject 1 pen into the skin every 14 (fourteen) days. 6 mL 3   ezetimibe (ZETIA) 10 MG tablet Take 1 tablet (10 mg total) by mouth daily. 30 tablet 11   lisinopril (ZESTRIL) 20 MG tablet TAKE 1 TABLET BY MOUTH EVERY DAY 90 tablet 0   Omega-3 Fatty Acids (FISH OIL) 1000 MG CAPS Take 1 capsule by mouth daily.     psyllium (METAMUCIL) 58.6 % powder Take 1 packet by mouth as needed (constipation).     Travoprost, BAK Free, (TRAVATAN) 0.004 % SOLN ophthalmic solution Place 1 drop into both eyes at bedtime.      No current facility-administered medications for this visit.    Allergies:   Atorvastatin and Rosuvastatin    Social History:  The patient  reports that she has never smoked. She has never used smokeless tobacco. She reports that she does not drink alcohol and does not use drugs.   Family History:  The patient's family history includes CAD in her sister; CAD (age of onset: 66) in her maternal uncle; CAD (age of onset: 73) in her maternal uncle; CVA in her mother; Heart attack in her father; Heart attack (age of onset: 62) in her maternal uncle; Heart attack (age of onset: 48) in her maternal uncle; Heart attack (age of onset: 63) in her sister; Sudden death (age of onset: 18) in her father.    ROS:  Please see the history of present illness.   Otherwise, review of systems are positive for back pain.   All other systems are reviewed and negative.    PHYSICAL EXAM: VS:  BP 122/72   Pulse 79   Ht '5\' 5"'$  (1.651 m)   Wt 196 lb (88.9 kg)   LMP  (LMP Unknown)   SpO2 97%   BMI 32.62 kg/m  , BMI Body mass index is 32.62 kg/m. GEN: Well nourished, well developed, in no acute distress HEENT: normal Neck: no JVD, carotid bruits, or masses Cardiac: RRR; no murmurs, rubs, or gallops,no edema  Respiratory:  clear to auscultation bilaterally, normal work of breathing GI: soft, nontender, nondistended, + BS MS: no deformity or atrophy Skin: warm and dry, no rash Neuro:   Strength and sensation are intact Psych: euthymic mood, full affect   EKG:   The ekg ordered today demonstrates NSR, no ST changes   Recent Labs: 07/28/2021: ALT 26   Lipid Panel    Component Value Date/Time   CHOL 159 03/24/2022 1018   TRIG 234 (H) 03/24/2022 1018  HDL 49 03/24/2022 1018   CHOLHDL 3.2 03/24/2022 1018   CHOLHDL 4 04/22/2015 0909   VLDL 35.4 04/22/2015 0909   LDLCALC 72 03/24/2022 1018   LDLDIRECT 106.3 05/09/2013 4403     Other studies Reviewed: Additional studies/ records that were reviewed today with results demonstrating: labs reviewed.   ASSESSMENT AND PLAN:  CAD: Mild from prior cath.  COntinue aggressive secondary prevention.  No angina on preventive therapy.  Preoperative: Walks 20 minutes without chest pain. No preoperative cardiac testing needed if surgery is required.  Hyperlipidemia: TG 234 LDL 72 HDL 49 total cholesterol 159.  Statin intolerant but tolerating Zetia, repatha.  Whole food, plant based diet.  Family h/o CAD: Continue aggressive preventive therapy, healthy diet, regular exercise is,Medications. HTN: The current medical regimen is effective;  continue present plan and medications.  Avoid processed foods.    Current medicines are reviewed at length with the patient today.  The patient concerns regarding her medicines were addressed.  The following changes have been made:  No change  Labs/ tests ordered today include:  No orders of the defined types were placed in this encounter.   Recommend 150 minutes/week of aerobic exercise Low fat, low carb, high fiber diet recommended  Disposition:   FU in 1 year   Signed, Larae Grooms, MD  04/08/2022 2:38 PM    [facility] [address], [place], [place]  [address] Phone: [phone]; Fax: [phone]

## 2022-04-08 ENCOUNTER — Encounter: Payer: Self-pay | Admitting: Interventional Cardiology

## 2022-04-08 ENCOUNTER — Ambulatory Visit: Payer: Medicare PPO | Admitting: Interventional Cardiology

## 2022-04-08 VITALS — BP 122/72 | HR 79 | Ht 65.0 in | Wt 196.0 lb

## 2022-04-08 DIAGNOSIS — I2510 Atherosclerotic heart disease of native coronary artery without angina pectoris: Secondary | ICD-10-CM

## 2022-04-08 DIAGNOSIS — Z8249 Family history of ischemic heart disease and other diseases of the circulatory system: Secondary | ICD-10-CM

## 2022-04-08 DIAGNOSIS — I10 Essential (primary) hypertension: Secondary | ICD-10-CM

## 2022-04-08 DIAGNOSIS — E782 Mixed hyperlipidemia: Secondary | ICD-10-CM

## 2022-04-08 NOTE — Patient Instructions (Signed)

## 2022-06-05 ENCOUNTER — Other Ambulatory Visit: Payer: Self-pay | Admitting: Interventional Cardiology

## 2022-07-27 ENCOUNTER — Other Ambulatory Visit: Payer: Self-pay | Admitting: Interventional Cardiology

## 2022-08-19 ENCOUNTER — Other Ambulatory Visit: Payer: Self-pay | Admitting: Interventional Cardiology

## 2022-11-05 ENCOUNTER — Other Ambulatory Visit (HOSPITAL_COMMUNITY): Payer: Self-pay

## 2022-11-24 ENCOUNTER — Emergency Department (HOSPITAL_COMMUNITY): Payer: Medicare PPO

## 2022-11-24 ENCOUNTER — Other Ambulatory Visit: Payer: Self-pay

## 2022-11-24 ENCOUNTER — Emergency Department (HOSPITAL_COMMUNITY)
Admission: EM | Admit: 2022-11-24 | Discharge: 2022-11-24 | Disposition: A | Discharge status: Home / Self Care | Payer: Medicare PPO | Attending: Emergency Medicine | Admitting: Emergency Medicine

## 2022-11-24 DIAGNOSIS — E785 Hyperlipidemia, unspecified: Secondary | ICD-10-CM

## 2022-11-24 DIAGNOSIS — Z7982 Long term (current) use of aspirin: Secondary | ICD-10-CM | POA: Diagnosis not present

## 2022-11-24 DIAGNOSIS — M546 Pain in thoracic spine: Secondary | ICD-10-CM

## 2022-11-24 DIAGNOSIS — I2510 Atherosclerotic heart disease of native coronary artery without angina pectoris: Secondary | ICD-10-CM | POA: Diagnosis not present

## 2022-11-24 DIAGNOSIS — R079 Chest pain, unspecified: Secondary | ICD-10-CM | POA: Diagnosis not present

## 2022-11-24 DIAGNOSIS — I10 Essential (primary) hypertension: Secondary | ICD-10-CM

## 2022-11-24 DIAGNOSIS — Z96652 Presence of left artificial knee joint: Secondary | ICD-10-CM | POA: Insufficient documentation

## 2022-11-24 DIAGNOSIS — R0789 Other chest pain: Secondary | ICD-10-CM

## 2022-11-24 DIAGNOSIS — Z79899 Other long term (current) drug therapy: Secondary | ICD-10-CM | POA: Insufficient documentation

## 2022-11-24 LAB — CBC
HCT: 45.1 % (ref 36.0–46.0)
Hemoglobin: 15.2 g/dL — ABNORMAL HIGH (ref 12.0–15.0)
MCH: 32.4 pg (ref 26.0–34.0)
MCHC: 33.7 g/dL (ref 30.0–36.0)
MCV: 96.2 fL (ref 80.0–100.0)
Platelets: 245 10*3/uL (ref 150–400)
RBC: 4.69 MIL/uL (ref 3.87–5.11)
RDW: 13.1 % (ref 11.5–15.5)
WBC: 7.7 10*3/uL (ref 4.0–10.5)
nRBC: 0 % (ref 0.0–0.2)

## 2022-11-24 LAB — TROPONIN I (HIGH SENSITIVITY)
Troponin I (High Sensitivity): 5 ng/L (ref <18)
Troponin I (High Sensitivity): 6 ng/L (ref <18)

## 2022-11-24 LAB — BASIC METABOLIC PANEL
Anion gap: 12 (ref 5–15)
BUN: 24 mg/dL — ABNORMAL HIGH (ref 8–23)
CO2: 23 mmol/L (ref 22–32)
Calcium: 10.4 mg/dL — ABNORMAL HIGH (ref 8.9–10.3)
Chloride: 105 mmol/L (ref 98–111)
Creatinine, Ser: 1.01 mg/dL — ABNORMAL HIGH (ref 0.44–1.00)
GFR, Estimated: 60 mL/min — ABNORMAL LOW (ref >60)
Glucose, Bld: 119 mg/dL — ABNORMAL HIGH (ref 70–99)
Potassium: 4.8 mmol/L (ref 3.5–5.1)
Sodium: 140 mmol/L (ref 135–145)

## 2022-11-24 MED ORDER — CYCLOBENZAPRINE HCL 10 MG PO TABS: 10.0000 mg | ORAL_TABLET | Freq: Two times a day (BID) | ORAL | 0 refills | Status: DC | PRN

## 2022-11-24 MED ORDER — OXYCODONE-ACETAMINOPHEN 5-325 MG PO TABS
1.0000 | ORAL_TABLET | Freq: Once | ORAL | Status: AC
  Administered 2022-11-24: 1 via ORAL
  Filled 2022-11-24: qty 1

## 2022-11-24 MED ORDER — ASPIRIN 325 MG PO TABS
325.0000 mg | ORAL_TABLET | Freq: Every day | ORAL | Status: DC
  Administered 2022-11-24: 325 mg via ORAL
  Filled 2022-11-24: qty 1

## 2022-11-24 MED ORDER — MELOXICAM 7.5 MG PO TABS: 7.5000 mg | ORAL_TABLET | Freq: Every day | ORAL | 0 refills | Status: DC

## 2022-11-24 NOTE — ED Provider Notes (Signed)
[facility] Provider Note   CSN: [id number] Arrival date & time: 11/24/22  1749     History  Chief Complaint  Patient presents with   Chest Pain   Back Pain    Carol Cox is a 72 y.o. female.  HPI 72 year old female presents today complaining of upper back pain.  Patient states that she has had upper back pain for the past 3 weeks.  She feels that it was initiated with some unusual lifting that she was doing.  She said did not occur during lifting but occurred several days later.  Pain has in creased some with movement and position.  She is not having any numbness, tingling, or weakness in her hands.  Last night she had some pain in the front that she describes from the mid chest down to the epigastrium.  This has resolved.  She denies fever, chills, productive cough, history of DVT or PE, cardiac history, rashes, nausea, vomiting, diarrhea.    Home Medications Prior to Admission medications   Medication Sig Start Date End Date Taking? Authorizing Provider  cyclobenzaprine (FLEXERIL) 10 MG tablet Take 1 tablet (10 mg total) by mouth 2 (two) times daily as needed for muscle spasms. 11/24/22  Yes Pattricia Boss, MD  meloxicam (MOBIC) 7.5 MG tablet Take 1 tablet (7.5 mg total) by mouth daily. 11/24/22  Yes Pattricia Boss, MD  aspirin EC 81 MG tablet Take 81 mg by mouth daily.    [provider]  Calcium Carbonate-Vit D-Min (CALCIUM 1200 PO) Take 1 capsule by mouth daily.    [provider]  calcium-vitamin D (OSCAL WITH D) 500-200 MG-UNIT TABS tablet Take 1 tablet by mouth daily.    [provider]  Cholecalciferol 25 MCG (1000 UT) capsule Take 1 capsule by mouth daily.    [provider]  dorzolamide-timolol (COSOPT) 22.3-6.8 MG/ML ophthalmic solution Place 1 drop into both eyes in the morning and at bedtime. 04/09/20   [provider]  ezetimibe (ZETIA) 10 MG tablet TAKE 1 TABLET BY MOUTH EVERY  DAY 08/19/22   Jettie Booze, MD  lisinopril (ZESTRIL) 20 MG tablet TAKE 1 TABLET BY MOUTH EVERY DAY 07/27/22   Jettie Booze, MD  Omega-3 Fatty Acids (FISH OIL) 1000 MG CAPS Take 1 capsule by mouth daily.    [provider]  psyllium (METAMUCIL) 58.6 % powder Take 1 packet by mouth as needed (constipation).    [provider]  REPATHA SURECLICK 449 MG/ML SOAJ INJECT 1 PEN INTO THE SKIN EVERY 14 (FOURTEEN) DAYS. 06/05/22   Jettie Booze, MD  Travoprost, BAK Free, (TRAVATAN) 0.004 % SOLN ophthalmic solution Place 1 drop into both eyes at bedtime.     [provider]      Allergies    Atorvastatin and Rosuvastatin    Review of Systems   Review of Systems  Physical Exam Updated Vital Signs BP 131/73   Pulse 63   Temp 97.9 F (36.6 C) (Oral)   Resp 15   Ht 1.651 m ('5\' 5"'$ )   Wt 86.2 kg   LMP  (LMP Unknown)   SpO2 93%   BMI 31.62 kg/m  Physical Exam Vitals reviewed.  HENT:     Head: Normocephalic.  Eyes:     Pupils: Pupils are equal, round, and reactive to light.  Cardiovascular:     Rate and Rhythm: Normal rate and regular rhythm.     Heart sounds: Normal heart sounds.  Pulmonary:     Effort: Pulmonary effort is normal.     Breath sounds: Normal breath sounds.  Chest:     Chest wall: No mass or tenderness.  Abdominal:     General: Bowel sounds are normal.     Palpations: Abdomen is soft.     Comments: Back visually examined without signs of trauma No redness No point tenderness on exam   Musculoskeletal:        General: Normal range of motion.     Cervical back: Normal range of motion and neck supple.  Skin:    General: Skin is warm.     Capillary Refill: Capillary refill takes less than 2 seconds.  Neurological:     General: No focal deficit present.     Mental Status: She is alert.     ED Results / Procedures / Treatments   Labs (all labs ordered are listed, but only abnormal results are displayed) Labs Reviewed   BASIC METABOLIC PANEL - Abnormal; Notable for the following components:      Result Value   Glucose, Bld 119 (*)    BUN 24 (*)    Creatinine, Ser 1.01 (*)    Calcium 10.4 (*)    GFR, Estimated 60 (*)    All other components within normal limits  CBC - Abnormal; Notable for the following components:   Hemoglobin 15.2 (*)    All other components within normal limits  TROPONIN I (HIGH SENSITIVITY)  TROPONIN I (HIGH SENSITIVITY)    EKG EKG Interpretation  Date/Time:  Tuesday November 24 2022 06:55:20 EST Ventricular Rate:  71 PR Interval:  166 QRS Duration: 76 QT Interval:  376 QTC Calculation: 408 R Axis:   -26 Text Interpretation: Normal sinus rhythm Low voltage QRS Borderline ECG When compared with ECG of 04-Feb-2013 05:31, PREVIOUS ECG IS PRESENT No significant change since last tracing Confirmed by Pattricia Boss [id number]) on 11/24/2022 11:47:42 AM  Radiology DG Chest 2 View  Result Date: 11/24/2022 CLINICAL DATA:  Pt having pain mid chest and around to back for about 3 weeks now, worse last night - some SOB especially when laying down at night - hx of heart disease in family, CAD EXAM: CHEST - 2 VIEW COMPARISON:  04/23/2020 FINDINGS: Minimal linear scarring or subsegmental atelectasis in the lingula, stable. Lungs otherwise clear. Heart size and mediastinal contours are within normal limits. No effusion.  No pneumothorax. Anterior vertebral endplate spurring at multiple levels in the mid thoracic spine. IMPRESSION: No acute cardiopulmonary disease. Electronically Signed   By: Lucrezia Europe M.D.   On: 11/24/2022 07:55    Procedures Procedures    Medications Ordered in ED Medications  aspirin tablet 325 mg (325 mg Oral Given 11/24/22 1204)  oxyCODONE-acetaminophen (PERCOCET/ROXICET) 5-325 MG per tablet 1 tablet (1 tablet Oral Given 11/24/22 1204)    ED Course/ Medical Decision Making/ A&P Clinical Course as of 11/24/22 1516  Tue Nov 24, 2022  1158 CBC reviewed interpreted  significant for mildly elevated hemoglobin at 15.2 appears stable from previous [DR]  1517 Metabolic panel reviewed interpreted significant for hypercalcemia [DR]  1159 At 10.4 with first prior 10.2 otherwise appears within normal limits [DR]  1159 Chest x-[facility] Reichel reviewed interpreted no evidence of acute abnormality noted radiologist interpretation concurs [DR]    Clinical Course User Index [DR] Pattricia Boss, MD  Medical Decision Making Amount and/or Complexity of Data Reviewed Labs: ordered. Radiology: ordered.  Risk OTC drugs. Prescription drug management.   This patient presents to the ED for concern of upper back with episode of chest to epigastric discomfort, this involves an extensive number of treatment options, and is a complaint that carries with it a high risk of complications and morbidity.  The differential diagnosis includes thoracic disc or djd, lung- pneumothorax, pneumonia, cardiac etiology- atypical chest pain, aortic dissection, pe, gi etiology such as pancreatitis, gb disease   Co morbidities that complicate the patient evaluation  Djd, high cholesterol, family history of cad, hypertension   Additional history obtained:  Additional history obtained from reviewed note from [facility] heart care visit 04/08/2022, cath with mild noonbvstructi cacd 2014 External records from outside source obtained and reviewed including office note from [facility]   Lab Tests:  I Ordered, and personally interpreted labs.  The pertinent results include:  cbc- normal   Imaging Studies ordered:  I ordered imaging studies including chest x-Austin Pongratz reviewed interpreted within normal limits  I independently visualized and interpreted imaging which showed within normal limits I agree with the radiologist interpretation   Cardiac Monitoring: / EKG:  The patient was maintained on a cardiac monitor.  I personally viewed and interpreted the cardiac monitored  which showed an underlying rhythm of: EKG no acute changes troponin and delta trop 6, 5 Patient is on monitor with some hypertension with systolic blood pressure 431 and in normal sinus rhythm   Consultations Obtained:  I requested consultation with the cardiology,  and discussed lab and imaging findings as well as pertinent plan - they recommend: patient seen by cardiology and    Problem List / ED Course / Critical interventions / Medication management  Upper back pain Chest pain I ordered medication including percocet  for pain  Reevaluation of the patient after these medicines showed that the patient improved I have reviewed the patients home medicines and have made adjustments as needed   Social Determinants of Health:  Good access   Test / Admission - Considered:  Evaluated for thoracic back pain-is consistent with musculoskeletal pain no evidence of fx, low index of suspicion for infection, dissection, or other severe etiologies Plan muscle relaxants, nonsteroidals, Had episode of chest pain last night.  Cardiology consult requested and patient evaluated by cardiology and cleared for discharge         Final Clinical Impression(s) / ED Diagnoses Final diagnoses:  Acute midline thoracic back pain  Chest pain, unspecified type    Rx / DC Orders ED Discharge Orders          Ordered    cyclobenzaprine (FLEXERIL) 10 MG tablet  2 times daily PRN        11/24/22 1515    meloxicam (MOBIC) 7.5 MG tablet  Daily        11/24/22 1515              Pattricia Boss, MD 11/24/22 1516

## 2022-11-24 NOTE — Discharge Instructions (Signed)
Your back pain appears to be musculoskeletal You are being started on Flexeril and meloxicam for pain Please follow-up with your back doctor Return if you are having new or worsening symptoms such as shortness of breath, worsening chest pain, or weakness Your chest pain was evaluated by cardiology and thought to be also musculoskeletal origin.  No evidence of heart attack was noted. Please call for outpatient follow-up this week

## 2022-11-24 NOTE — ED Triage Notes (Signed)
Pt from home with complaint of central chest pain last night that was worse when lying flat and with which she had some associated shortness of breath. That pain has resolved and has not been present this morning but patient has generalized back and flank pain that she states is different than her chronic back pain.

## 2022-11-24 NOTE — Consult Note (Addendum)
Cardiology Consultation   Patient ID: Carol Cox MRN: [id number]; DOB: 03-Jul-1951  Admit date: 11/24/2022 Date of Consult: 11/24/2022  PCP:  Christain Sacramento, MD   [place] Providers Cardiologist:  Larae Grooms, MD   Patient Profile:   Carol Cox is a 72 y.o. female with a hx of nonobstructive CAD, HTN, HLD, obesity, and chronic low back pain who is being seen 11/24/2022 for the evaluation of chest pain at the request of Dr. Jeanell Sparrow.  History of Present Illness:   Ms. Schleicher had a left heart catheterization in 2014 that showed nonobstructive disease. CP felt to be noncardiac at that time. She is noted to have a strong family history of premature heart disease (father 60 yo, sister MI at 20, maternal uncle MI in his 8s). She is maintained on zetia, repatha, and fish oil. She had significant leg pain on  '5mg'$  crestor, even with only three times weekly dosing. Chronic back pain felt due to spinal stenosis s/p back surgery.   She has been working with lipid pharmD to control lipid profile, and often admits to needing to be better about her diet. She was last seen in clinic with Dr. Irish Lack on 04/08/22 and was dong well at that time. HTN managed with lisinopril.  She presented to [facility] 11/24/22 with subcentral chest pain that started last evening and was worse with lying flat. CP has resolved but she continues to have generalized back and flank pain that is different than her chronic back pain.   HS troponin x 2 negative No leukocytosis EKG does not appear ischemic  During my interview, she states she generally only sleep about 2-3 hours at a time due to chronic back and leg pain. Last night, she woke up with her usual leg pain but also felt substernal chest pain. Chest pain persisted until she got up from bed and walked around. CP resolved with moving around and has not returned. She presented to the ER for new back pain and new substernal chest pain. She reports that  she has been moving boxes upstairs during the first 2 weeks in January. Since that time, she has experienced worsening upper back pain, suspicious for MSK etiology. She denies other symptoms with the chest pain. CP was constant, but unable to describe the quality. Of note, she denies chest pain when exerting herself when moving boxes upstairs.   She is generally compliant with her daily medications but sometimes forgets to take repatha.   Past Medical History:  Diagnosis Date   Allergy    seasonal   Arthritis    "knee, left hip, ankles"    Borderline glaucoma    CAD (coronary artery disease)    Mild nonobstructive CAD by cath 02/06/13 (25% mid LAD), normal EF   Chronic low back pain    Colitis    Coronary arteriosclerosis    DDD (degenerative disc disease), lumbar    Degeneration of intervertebral disc of lumbar region    Family history of adverse reaction to anesthesia    mother has problems with nausea and vomiting    Glaucoma    History of colitis    02/ 2016  acute infectious colitis-- resolved   History of total knee arthroplasty    Hyperglycemia    Hyperlipidemia    Left ovarian cyst    Low back pain    with bilateral hip radiation    Lumbar radiculopathy    Lumbar spondylolysis    Obesity  Osteoporosis    ospenia of hips   Pain in joint of left shoulder    Pain of right hip joint    PONV (postoperative nausea and vomiting)    SEVERE    Past Surgical History:  Procedure Laterality Date   ABDOMINAL EXPOSURE N/A 05/01/2020   Procedure: ABDOMINAL EXPOSURE;  Surgeon: Rosetta Posner, MD;  Location: [place];  Service: Vascular;  Laterality: N/A;   ANTERIOR LAT LUMBAR FUSION N/A 05/01/2020   Procedure: ANTERIOR LATERAL LUMBAR FUSION L4-S1;  Surgeon: Melina Schools, MD;  Location: [place];  Service: Orthopedics;  Laterality: N/A;  4.5 hrs Dr. Donnetta Hutching to do approach tap block with exparel   CARPAL TUNNEL RELEASE Bilateral 1990's   KNEE ARTHROSCOPY W/ MENISCECTOMY Left 07/2014    LEFT HEART CATHETERIZATION WITH CORONARY ANGIOGRAM N/A 02/06/2013   Procedure: LEFT HEART CATHETERIZATION WITH CORONARY ANGIOGRAM;  Surgeon: [name] Hampshire, MD;  Location: [place] CATH [facility];  Service: Cardiovascular;  Laterality: N/A;  non-obstruction 25% mLAD,  normal LVF , ef 65-70%   left knee meniscus surgery      NASAL SINUS SURGERY     ovarian cyst     left ovary   ROTATOR CUFF REPAIR Right 10/2016   TOTAL KNEE ARTHROPLASTY Left 03/26/2015   Procedure: LEFT TOTAL KNEE ARTHROPLASTY;  Surgeon: Paralee Cancel, MD;  Location: [facility];  Service: Orthopedics;  Laterality: Left;     Home Medications:  Prior to Admission medications   Medication Sig Start Date End Date Taking? Authorizing Provider  aspirin EC 81 MG tablet Take 81 mg by mouth daily.    [provider]  Calcium Carbonate-Vit D-Min (CALCIUM 1200 PO) Take 1 capsule by mouth daily.    [provider]  calcium-vitamin D (OSCAL WITH D) 500-200 MG-UNIT TABS tablet Take 1 tablet by mouth daily.    [provider]  Cholecalciferol 25 MCG (1000 UT) capsule Take 1 capsule by mouth daily.    [provider]  dorzolamide-timolol (COSOPT) 22.3-6.8 MG/ML ophthalmic solution Place 1 drop into both eyes in the morning and at bedtime. 04/09/20   [provider]  ezetimibe (ZETIA) 10 MG tablet TAKE 1 TABLET BY MOUTH EVERY DAY 08/19/22   Jettie Booze, MD  lisinopril (ZESTRIL) 20 MG tablet TAKE 1 TABLET BY MOUTH EVERY DAY 07/27/22   Jettie Booze, MD  Omega-3 Fatty Acids (FISH OIL) 1000 MG CAPS Take 1 capsule by mouth daily.    [provider]  psyllium (METAMUCIL) 58.6 % powder Take 1 packet by mouth as needed (constipation).    [provider]  REPATHA SURECLICK 850 MG/ML SOAJ INJECT 1 PEN INTO THE SKIN EVERY 14 (FOURTEEN) DAYS. 06/05/22   Jettie Booze, MD  Travoprost, BAK Free, (TRAVATAN) 0.004 % SOLN ophthalmic solution Place 1 drop into both eyes at bedtime.     [provider]    Inpatient Medications: Scheduled Meds:  aspirin  325 mg Oral Daily   Continuous Infusions:  PRN Meds:   Allergies:    Allergies  Allergen Reactions   Atorvastatin Other (See Comments)    Leg pain   Rosuvastatin Other (See Comments)    Leg pain    Social History:   Social History   Socioeconomic History   Marital status: Married    Spouse name: Not on file   Number of children: 3   Years of education: Not on file   Highest education level: Not on file  Occupational History  Occupation: Retired    Comment: Pharmacist, hospital  Tobacco Use   Smoking status: Never   Smokeless tobacco: Never  Vaping Use   Vaping Use: Never used  Substance and Sexual Activity   Alcohol use: No   Drug use: No   Sexual activity: Yes  Other Topics Concern   Not on file  Social History Narrative   Lives at home with husband. 4 grandchildren.     Social Determinants of Health   Financial Resource Strain: Not on file  Food Insecurity: Not on file  Transportation Needs: Not on file  Physical Activity: Not on file  Stress: Not on file  Social Connections: Not on file  Intimate Partner Violence: Not on file    Family History:    Family History  Problem Relation Age of Onset   Sudden death Father 42       Presumed heart attack   Heart attack Father    CVA Mother    CAD Maternal Uncle 23   CAD Maternal Uncle 41   Heart attack Sister 28   CAD Sister    Heart attack Maternal Uncle 74   Heart attack Maternal Uncle 50     ROS:  Please see the history of present illness.   All other ROS reviewed and negative.     Physical Exam/Data:   Vitals:   11/24/22 1145 11/24/22 1200 11/24/22 1205 11/24/22 1215  BP: (!) 150/84 134/78  133/86  Pulse: 72 70  69  Resp: '19 16  20  '$ Temp:   97.9 F (36.6 C)   TempSrc:   Oral   SpO2: 98% 98%  97%  Weight:      Height:       No intake or output data in the 24 hours ending 11/24/22 1320    11/24/2022    7:16 AM 04/08/2022     2:30 PM 03/05/2021    3:56 PM  Last 3 Weights  Weight (lbs) 190 lb 196 lb 197 lb 6.4 oz  Weight (kg) 86.183 kg 88.905 kg 89.54 kg     Body mass index is 31.62 kg/m.  General:  Well nourished, well developed, in no acute distress HEENT: normal Neck: no JVD Vascular: No carotid bruits; Distal pulses 2+ bilaterally Cardiac:  normal S1, S2; RRR; no murmur  Lungs:  clear to auscultation bilaterally, no wheezing, rhonchi or rales  Abd: soft, nontender, no hepatomegaly  Ext: no edema Musculoskeletal:  No deformities, BUE and BLE strength normal and equal Skin: warm and dry  Neuro:  CNs 2-12 intact, no focal abnormalities noted Psych:  Normal affect   EKG:  The EKG was personally reviewed and demonstrates:  sinus rhythm with HR 71 Telemetry:  Telemetry was personally reviewed and demonstrates:  sinus rhythm in the 60s  Relevant CV Studies:  LHC 2014 Left mainstem: Short vessel, no significant CAD.    Left anterior descending (LAD): 25% mid LAD stenosis.    Left circumflex (LCx): No significant CAD.    Right coronary artery (RCA): No significant CAD.   Left ventriculography: Left ventricular systolic function is normal, LVEF is estimated at 65-70%, there is no significant mitral regurgitation    Final Conclusions:  Mild nonobstructive CAD.  Suspect noncardiac chest pain.  May go home today.  Continue home statin and continue ASA 81 mg daily.   Laboratory Data:  High Sensitivity Troponin:   Recent Labs  Lab 11/24/22 0725 11/24/22 1011  TROPONINIHS 5 6     Chemistry Recent  Labs  Lab 11/24/22 0725  NA 140  K 4.8  CL 105  CO2 23  GLUCOSE 119*  BUN 24*  CREATININE 1.01*  CALCIUM 10.4*  GFRNONAA 60*  ANIONGAP 12    No results for input(s): "PROT", "ALBUMIN", "AST", "ALT", "ALKPHOS", "BILITOT" in the last 168 hours. Lipids No results for input(s): "CHOL", "TRIG", "HDL", "LABVLDL", "LDLCALC", "CHOLHDL" in the last 168 hours.  Hematology Recent Labs  Lab 11/24/22 0725   WBC 7.7  RBC 4.69  HGB 15.2*  HCT 45.1  MCV 96.2  MCH 32.4  MCHC 33.7  RDW 13.1  PLT 245   Thyroid No results for input(s): "TSH", "FREET4" in the last 168 hours.  BNPNo results for input(s): "BNP", "PROBNP" in the last 168 hours.  DDimer No results for input(s): "DDIMER" in the last 168 hours.   Radiology/Studies:  DG Chest 2 View  Result Date: 11/24/2022 CLINICAL DATA:  Pt having pain mid chest and around to back for about 3 weeks now, worse last night - some SOB especially when laying down at night - hx of heart disease in family, CAD EXAM: CHEST - 2 VIEW COMPARISON:  04/23/2020 FINDINGS: Minimal linear scarring or subsegmental atelectasis in the lingula, stable. Lungs otherwise clear. Heart size and mediastinal contours are within normal limits. No effusion.  No pneumothorax. Anterior vertebral endplate spurring at multiple levels in the mid thoracic spine. IMPRESSION: No acute cardiopulmonary disease. Electronically Signed   By: Lucrezia Europe M.D.   On: 11/24/2022 07:55     Assessment and Plan:   Chest pain She has ruled out with negative troponin and nonischemic EKG. CP description is fairly atypical - constant, improved with sitting up and moving around, in the setting of likely muscle strain/back pain exacerbation. She has not had a recurrence of chest pain since last night. Her pain regimen is limited by glaucoma.  While I am not able to elicit chest pain with palpation on exam, I suspect this is noncardiac. Cardiology will follow up outpatient to discuss OP ischemic evaluation with possibly CT coronary.  Can likely discharge with PRN nitro. Continue ASA.   Nonobstructive CAD by heart cath in 2014 Hypertension Hyperlipidemia Continue present medications.    I have arranged cardiology follow up.    Risk Assessment/Risk Scores:       For questions or updates, please contact [place] Please consult [email or web address] for contact info under    Signed, Ledora Bottcher, PA  11/24/2022 1:20 PM

## 2022-12-04 ENCOUNTER — Encounter: Payer: Self-pay | Admitting: Physician Assistant

## 2022-12-04 ENCOUNTER — Ambulatory Visit: Payer: Medicare PPO | Attending: Physician Assistant | Admitting: Physician Assistant

## 2022-12-04 VITALS — BP 144/98 | HR 76 | Ht 65.0 in | Wt 196.4 lb

## 2022-12-04 DIAGNOSIS — Z8249 Family history of ischemic heart disease and other diseases of the circulatory system: Secondary | ICD-10-CM

## 2022-12-04 DIAGNOSIS — I2510 Atherosclerotic heart disease of native coronary artery without angina pectoris: Secondary | ICD-10-CM

## 2022-12-04 DIAGNOSIS — I10 Essential (primary) hypertension: Secondary | ICD-10-CM

## 2022-12-04 DIAGNOSIS — E785 Hyperlipidemia, unspecified: Secondary | ICD-10-CM | POA: Diagnosis not present

## 2022-12-04 MED ORDER — METOPROLOL TARTRATE 100 MG PO TABS: ORAL_TABLET | ORAL | 0 refills | Status: DC

## 2022-12-04 NOTE — Patient Instructions (Signed)
Medication Instructions:  Your physician recommends that you continue on your current medications as directed. Please refer to the Current Medication list given to you today.  *If you need a refill on your cardiac medications before your next appointment, please call your pharmacy*   Lab Work: BMET today Fasting lipid and lft's in May If you have labs (blood work) drawn today and your tests are completely normal, you will receive your results only by: [place] (if you have MyChart) OR A paper copy in the mail If you have any lab test that is abnormal or we need to change your treatment, we will call you to review the results.   Testing/Procedures:   Your cardiac CT will be scheduled at one of the below locations:   [facility] [address] [place], [place] [address] [phone] [place] [address] [place], [place] [address] [phone]  [facility] [place], [place] [address] [phone]  If scheduled at [facility], please arrive at the [facility] and Children's Entrance (Entrance C2) of [facility] 30 minutes prior to test start time. You can use the FREE valet parking offered at entrance C (encouraged to control the heart rate for the test)  Proceed to the [facility] Radiology Department (first floor) to check-in and test prep.  All radiology patients and guests should use entrance C2 at [facility], accessed from [facility], even though the hospital's physical address listed is [address]    If scheduled at [facility] or [facility], please arrive 15 mins early for check-in and test prep.   Please follow these instructions carefully (unless otherwise directed):  Hold all erectile dysfunction medications at least 3 days (72 hrs) prior to test.  (Ie viagra, cialis, sildenafil, tadalafil, etc) We will administer nitroglycerin during this exam.   On the Night Before the Test: Be sure to Drink plenty of water. Do not consume any caffeinated/decaffeinated beverages or chocolate 12 hours prior to your test. Do not take any antihistamines 12 hours prior to your test. If the patient has contrast allergy: Patient will need a prescription for Prednisone and very clear instructions (as follows): Prednisone 50 mg - take 13 hours prior to test Take another Prednisone 50 mg 7 hours prior to test Take another Prednisone 50 mg 1 hour prior to test Take Benadryl 50 mg 1 hour prior to test Patient must complete all four doses of above prophylactic medications. Patient will need a ride after test due to Benadryl.  On the Day of the Test: Drink plenty of water until 1 hour prior to the test. Do not eat any food 1 hour prior to test. You may take your regular medications prior to the test.  Take metoprolol tartrate (Lopressor) 100 mg two hours prior to test. If you take Furosemide/Hydrochlorothiazide/Spironolactone, please HOLD on the morning of the test. FEMALES- please wear underwire-free bra if available, avoid dresses & tight clothing       After the Test: Drink plenty of water. After receiving IV contrast, you may experience a mild flushed feeling. This is normal. On occasion, you may experience a mild rash up to 24 hours after the test. This is not dangerous. If this occurs, you can take Benadryl 25 mg and increase your fluid intake. If you experience trouble breathing, this can be serious. If  it is severe call 911 IMMEDIATELY. If it is mild, please call our office. If you take any of these medications: Glipizide/Metformin, Avandament, Glucavance, please do not take 48 hours after completing test unless otherwise instructed.  We will call to schedule your test 2-4 weeks out understanding that some insurance companies will need an  authorization prior to the service being performed.   For non-scheduling related questions, please contact the cardiac imaging nurse navigator should you have any questions/concerns: Marchia Bond, Cardiac Imaging Nurse Navigator Gordy Clement, Cardiac Imaging Nurse Navigator [facility] Direct Office Dial: [phone]   For scheduling needs, including cancellations and rescheduling, please call [place], [phone].    Follow-Up: At [facility], you and your health needs are our priority.  As part of our continuing mission to provide you with exceptional heart care, we have created designated Provider Care Teams.  These Care Teams include your primary Cardiologist (physician) and Advanced Practice Providers (APPs -  Physician Assistants and Nurse Practitioners) who all work together to provide you with the care you need, when you need it.   Your next appointment:   4 month(s)  Provider:   Larae Grooms, MD

## 2022-12-04 NOTE — Progress Notes (Signed)
Office Visit    Patient Name: Carol Cox Date of Encounter: 12/04/2022  PCP:  Christain Sacramento, MD   [place]  Cardiologist:  Larae Grooms, MD  Advanced Practice Provider:  No care team member to display Electrophysiologist:  None   HPI    Carol Cox is a 72 y.o. female who has a strong family history of CAD including her dad at age 66 and sister at age 30 with MI, maternal uncle with MI in his 106s, chronic low back pain, hypertension and hyperlipidemia presents today for follow-up appointment.  She had chest discomfort in 2014 which resulted in a cardiac catheterization which showed mild nonobstructive CAD.  She was thought to have noncardiac chest pain.  She had knee surgery a few years ago and then shoulder surgery more recently.  She has ankle tendinitis as well.  Stationary bike had worsened her knee pain.  Over the past few years, she developed worsening spinal stenosis and had surgery.  Walking has been limited.  She may need back surgery.  Back pain was limiting her walking and doing yard work.  She was seen 03/2022 by Dr. Irish Lack and at that time denied chest pain, dizziness, leg edema, nitro use, orthopnea, palpitations, paroxysmal nocturnal dyspnea, shortness of breath, and syncope.  Today, she states that things have been doing fine but she went to an ER a week ago Monday night for back pain, prior usual.  She usually has back pain but it is lower.  She has been doing some heavy lifting.  Shoulders were also sore when she went to bed that night and she woke up at 2 AM with chest pain in the center of her chest.  She sat up in her chair and it eased up somewhat.  She debated on going to the ER but due to her family history of CAD she decided to go.  Troponin negative and EKG negative.  She has not had any further chest pains.  She was given a muscle relaxer for her back pain but she has not been taking it due to her glaucoma.  There are a  lot of medications that she cannot take.  She describes the chest pain as constant dull pain.  Nothing helps the pain.  It was gone by the time she went to the ER.  Reports no shortness of breath nor dyspnea on exertion.  No edema, orthopnea, PND. Reports no palpitations.    Past Medical History    Past Medical History:  Diagnosis Date   Allergy    seasonal   Arthritis    "knee, left hip, ankles"    Borderline glaucoma    CAD (coronary artery disease)    Mild nonobstructive CAD by cath 02/06/13 (25% mid LAD), normal EF   Chronic low back pain    Colitis    Coronary arteriosclerosis    DDD (degenerative disc disease), lumbar    Degeneration of intervertebral disc of lumbar region    Family history of adverse reaction to anesthesia    mother has problems with nausea and vomiting    Glaucoma    History of colitis    02/ 2016  acute infectious colitis-- resolved   History of total knee arthroplasty    Hyperglycemia    Hyperlipidemia    Left ovarian cyst    Low back pain    with bilateral hip radiation    Lumbar radiculopathy    Lumbar spondylolysis  Obesity    Osteoporosis    ospenia of hips   Pain in joint of left shoulder    Pain of right hip joint    PONV (postoperative nausea and vomiting)    SEVERE   Past Surgical History:  Procedure Laterality Date   ABDOMINAL EXPOSURE N/A 05/01/2020   Procedure: ABDOMINAL EXPOSURE;  Surgeon: Rosetta Posner, MD;  Location: [place];  Service: Vascular;  Laterality: N/A;   ANTERIOR LAT LUMBAR FUSION N/A 05/01/2020   Procedure: ANTERIOR LATERAL LUMBAR FUSION L4-S1;  Surgeon: Melina Schools, MD;  Location: [place];  Service: Orthopedics;  Laterality: N/A;  4.5 hrs Dr. Donnetta Hutching to do approach tap block with exparel   CARPAL TUNNEL RELEASE Bilateral 1990's   KNEE ARTHROSCOPY W/ MENISCECTOMY Left 07/2014   LEFT HEART CATHETERIZATION WITH CORONARY ANGIOGRAM N/A 02/06/2013   Procedure: LEFT HEART CATHETERIZATION WITH CORONARY ANGIOGRAM;  Surgeon:  [name] Hampshire, MD;  Location: [place] [facility];  Service: Cardiovascular;  Laterality: N/A;  non-obstruction 25% mLAD,  normal LVF , ef 65-70%   left knee meniscus surgery      NASAL SINUS SURGERY     ovarian cyst     left ovary   ROTATOR CUFF REPAIR Right 10/2016   TOTAL KNEE ARTHROPLASTY Left 03/26/2015   Procedure: LEFT TOTAL KNEE ARTHROPLASTY;  Surgeon: Paralee Cancel, MD;  Location: [facility];  Service: Orthopedics;  Laterality: Left;    Allergies  Allergies  Allergen Reactions   Atorvastatin Other (See Comments)    Leg pain   Rosuvastatin Other (See Comments)    Leg pain    EKGs/Labs/Other Studies Reviewed:   The following studies were reviewed today: No recent studies.  EKG:  EKG is not ordered today.    Recent Labs: 11/24/2022: BUN 24; Creatinine, Ser 1.01; Hemoglobin 15.2; Platelets 245; Potassium 4.8; Sodium 140  Recent Lipid Panel    Component Value Date/Time   CHOL 159 03/24/2022 1018   TRIG 234 (H) 03/24/2022 1018   HDL 49 03/24/2022 1018   CHOLHDL 3.2 03/24/2022 1018   CHOLHDL 4 04/22/2015 0909   VLDL 35.4 04/22/2015 0909   LDLCALC 72 03/24/2022 1018   LDLDIRECT 106.3 05/09/2013 0921      Home Medications   Current Meds  Medication Sig   aspirin EC 81 MG tablet Take 81 mg by mouth daily.   Calcium Carbonate-Vit D-Min (CALCIUM 1200 PO) Take 1 capsule by mouth daily.   calcium-vitamin D (OSCAL WITH D) 500-200 MG-UNIT TABS tablet Take 1 tablet by mouth daily.   Cholecalciferol 25 MCG (1000 UT) capsule Take 1 capsule by mouth daily.   cyclobenzaprine (FLEXERIL) 10 MG tablet Take 1 tablet (10 mg total) by mouth 2 (two) times daily as needed for muscle spasms.   dorzolamide-timolol (COSOPT) 22.3-6.8 MG/ML ophthalmic solution Place 1 drop into both eyes in the morning and at bedtime.   ezetimibe (ZETIA) 10 MG tablet TAKE 1 TABLET BY MOUTH EVERY DAY   lisinopril (ZESTRIL) 20 MG tablet TAKE 1 TABLET BY MOUTH EVERY DAY   meloxicam (MOBIC) 7.5 MG tablet Take 1 tablet  (7.5 mg total) by mouth daily.   metoprolol tartrate (LOPRESSOR) 100 MG tablet Take one tablet by mouth 2 hours prior to CT.   Omega-3 Fatty Acids (FISH OIL) 1000 MG CAPS Take 1 capsule by mouth daily.   psyllium (METAMUCIL) 58.6 % powder Take 1 packet by mouth as needed (constipation).   REPATHA SURECLICK XX123456 MG/ML SOAJ INJECT 1 PEN INTO THE SKIN EVERY  14 (FOURTEEN) DAYS.   Travoprost, BAK Free, (TRAVATAN) 0.004 % SOLN ophthalmic solution Place 1 drop into both eyes at bedtime.      Review of Systems      All other systems reviewed and are otherwise negative except as noted above.  Physical Exam    VS:  BP (!) 144/98   Pulse 76   Ht 5' 5"$  (1.651 m)   Wt 196 lb 6.4 oz (89.1 kg)   LMP  (LMP Unknown)   SpO2 96%   BMI 32.68 kg/m  , BMI Body mass index is 32.68 kg/m.  Wt Readings from Last 3 Encounters:  12/04/22 196 lb 6.4 oz (89.1 kg)  11/24/22 190 lb (86.2 kg)  04/08/22 196 lb (88.9 kg)     GEN: Well nourished, well developed, in no acute distress. HEENT: normal. Neck: Supple, no JVD, carotid bruits, or masses. Cardiac: RRR, no murmurs, rubs, or gallops. No clubbing, cyanosis, edema.  Radials/PT 2+ and equal bilaterally.  Respiratory:  Respirations regular and unlabored, clear to auscultation bilaterally. GI: Soft, nontender, nondistended. MS: No deformity or atrophy. Skin: Warm and dry, no rash. Neuro:  Strength and sensation are intact. Psych: Normal affect.  Assessment & Plan    CAD -coronary CTA ordered today -strong family history of CAD -Recommend to continue current medication which includes aspirin 81 mg daily, Zetia 10 mg daily, lisinopril 20 mg daily, fish oil 1000 mg daily  Hyperlipidemia -working on diet -fish oil 1079m and zetia 150mdaily -try back walking some but the back holds her up -LDL 72 but triglycerides 234  -discussed dietary changes.    HTN -slightly elevated today, 146/98, 144/98 on retake -continue current medication  regimen -monitor at home and if remains elevated, may require a medication change  DDD -chronic back pain -nerve stimulator candidate, but declined -patient would prefer to continue conservative treatment at this time  HYPERTENSION CONTROL Vitals:   12/04/22 1514 12/04/22 1711  BP: (!) 146/98 (!) 144/98    The patient's blood pressure is elevated above target today.  In order to address the patient's elevated BP: Blood pressure will be monitored at home to determine if medication changes need to be made.         Disposition: Follow up 4 months with JaLarae GroomsMD or APP.  Signed, TeElgie CollardPA-C 12/04/2022, 5:11 PM [facility]

## 2022-12-05 LAB — BASIC METABOLIC PANEL
BUN/Creatinine Ratio: 23 (ref 12–28)
BUN: 23 mg/dL (ref 8–27)
CO2: 23 mmol/L (ref 20–29)
Calcium: 10.6 mg/dL — ABNORMAL HIGH (ref 8.7–10.3)
Chloride: 104 mmol/L (ref 96–106)
Creatinine, Ser: 1.01 mg/dL — ABNORMAL HIGH (ref 0.57–1.00)
Glucose: 99 mg/dL (ref 70–99)
Potassium: 4.6 mmol/L (ref 3.5–5.2)
Sodium: 141 mmol/L (ref 134–144)
eGFR: 60 mL/min/{1.73_m2} (ref >59)

## 2023-01-23 ENCOUNTER — Other Ambulatory Visit: Payer: Self-pay | Admitting: Interventional Cardiology

## 2023-02-15 ENCOUNTER — Other Ambulatory Visit: Payer: Self-pay | Admitting: Interventional Cardiology

## 2023-02-24 ENCOUNTER — Ambulatory Visit: Payer: Medicare PPO | Attending: Physician Assistant

## 2023-02-24 ENCOUNTER — Other Ambulatory Visit: Payer: Self-pay | Admitting: Physician Assistant

## 2023-02-24 DIAGNOSIS — E785 Hyperlipidemia, unspecified: Secondary | ICD-10-CM

## 2023-02-24 DIAGNOSIS — I259 Chronic ischemic heart disease, unspecified: Secondary | ICD-10-CM

## 2023-02-24 DIAGNOSIS — I10 Essential (primary) hypertension: Secondary | ICD-10-CM

## 2023-02-24 DIAGNOSIS — I2510 Atherosclerotic heart disease of native coronary artery without angina pectoris: Secondary | ICD-10-CM

## 2023-02-24 DIAGNOSIS — Z8249 Family history of ischemic heart disease and other diseases of the circulatory system: Secondary | ICD-10-CM

## 2023-02-24 LAB — HEPATIC FUNCTION PANEL
ALT: 33 IU/L — ABNORMAL HIGH (ref 0–32)
AST: 28 IU/L (ref 0–40)
Albumin: 4.4 g/dL (ref 3.9–4.9)
Alkaline Phosphatase: 59 IU/L (ref 44–121)
Bilirubin Total: 0.3 mg/dL (ref 0.0–1.2)
Bilirubin, Direct: 0.11 mg/dL (ref 0.00–0.40)
Total Protein: 7.3 g/dL (ref 6.0–8.5)

## 2023-02-24 LAB — LIPID PANEL
Chol/HDL Ratio: 3.6 ratio (ref 0.0–4.4)
Cholesterol, Total: 159 mg/dL (ref 100–199)
HDL: 44 mg/dL (ref >39)
LDL Chol Calc (NIH): 81 mg/dL (ref 0–99)
Triglycerides: 202 mg/dL — ABNORMAL HIGH (ref 0–149)
VLDL Cholesterol Cal: 34 mg/dL (ref 5–40)

## 2023-02-25 ENCOUNTER — Other Ambulatory Visit: Payer: Self-pay | Admitting: *Deleted

## 2023-02-25 DIAGNOSIS — E785 Hyperlipidemia, unspecified: Secondary | ICD-10-CM

## 2023-02-26 ENCOUNTER — Telehealth (HOSPITAL_COMMUNITY): Payer: Self-pay | Admitting: *Deleted

## 2023-02-26 NOTE — Telephone Encounter (Signed)
Reaching out to patient to offer assistance regarding upcoming cardiac imaging study; pt verbalizes understanding of appt date/time, parking situation and where to check in, pre-test NPO status and medications ordered, and verified current allergies; name and call back number provided for further questions should they arise ? ?Kenlynn Houde RN Navigator Cardiac Imaging ?[facility] ?[phone] office ?[phone] cell ? ?Patient to take 100mg metoprolol tartrate two hours prior to her cardiac CT scan.  She is aware to arrive at 12:30pm. ?

## 2023-03-01 ENCOUNTER — Ambulatory Visit (HOSPITAL_COMMUNITY)
Admission: RE | Admit: 2023-03-01 | Discharge: 2023-03-01 | Disposition: A | Discharge status: Home / Self Care | Payer: Medicare PPO | Source: Ambulatory Visit | Attending: Physician Assistant | Admitting: Physician Assistant

## 2023-03-01 ENCOUNTER — Other Ambulatory Visit: Payer: Self-pay | Admitting: *Deleted

## 2023-03-01 DIAGNOSIS — I259 Chronic ischemic heart disease, unspecified: Secondary | ICD-10-CM | POA: Diagnosis not present

## 2023-03-01 MED ORDER — IOHEXOL 350 MG/ML SOLN
95.0000 mL | Freq: Once | INTRAVENOUS | Status: AC | PRN
  Administered 2023-03-01: 95 mL via INTRAVENOUS

## 2023-03-01 MED ORDER — NITROGLYCERIN 0.4 MG SL SUBL
SUBLINGUAL_TABLET | SUBLINGUAL | Status: AC
  Filled 2023-03-01: qty 2

## 2023-03-01 MED ORDER — NITROGLYCERIN 0.4 MG SL SUBL
0.8000 mg | SUBLINGUAL_TABLET | Freq: Once | SUBLINGUAL | Status: AC
  Administered 2023-03-01: 0.8 mg via SUBLINGUAL

## 2023-03-02 ENCOUNTER — Other Ambulatory Visit: Payer: Self-pay | Admitting: *Deleted

## 2023-03-02 DIAGNOSIS — Q2112 Patent foramen ovale: Secondary | ICD-10-CM

## 2023-03-31 ENCOUNTER — Ambulatory Visit (HOSPITAL_COMMUNITY): Payer: Medicare PPO | Attending: Physician Assistant

## 2023-03-31 DIAGNOSIS — Q2112 Patent foramen ovale: Secondary | ICD-10-CM | POA: Diagnosis present

## 2023-03-31 LAB — ECHOCARDIOGRAM COMPLETE BUBBLE STUDY
Area-P 1/2: 3.39 cm2
S' Lateral: 2.7 cm

## 2023-04-22 ENCOUNTER — Encounter: Payer: Self-pay | Admitting: Internal Medicine

## 2023-04-27 ENCOUNTER — Ambulatory Visit: Payer: Medicare PPO | Attending: Physician Assistant

## 2023-04-27 DIAGNOSIS — E785 Hyperlipidemia, unspecified: Secondary | ICD-10-CM

## 2023-04-27 LAB — LIPID PANEL
Chol/HDL Ratio: 3.6 ratio (ref 0.0–4.4)
Cholesterol, Total: 162 mg/dL (ref 100–199)
HDL: 45 mg/dL (ref >39)
LDL Chol Calc (NIH): 80 mg/dL (ref 0–99)
Triglycerides: 223 mg/dL — ABNORMAL HIGH (ref 0–149)
VLDL Cholesterol Cal: 37 mg/dL (ref 5–40)

## 2023-05-08 ENCOUNTER — Other Ambulatory Visit: Payer: Self-pay | Admitting: Interventional Cardiology

## 2023-06-25 ENCOUNTER — Encounter: Payer: Self-pay | Admitting: Physician Assistant

## 2023-06-25 ENCOUNTER — Ambulatory Visit: Payer: Medicare PPO | Attending: Interventional Cardiology | Admitting: Physician Assistant

## 2023-06-25 ENCOUNTER — Ambulatory Visit: Payer: Medicare PPO | Admitting: Interventional Cardiology

## 2023-06-25 VITALS — BP 132/84 | HR 81 | Ht 65.0 in | Wt 194.6 lb

## 2023-06-25 DIAGNOSIS — I10 Essential (primary) hypertension: Secondary | ICD-10-CM

## 2023-06-25 DIAGNOSIS — E785 Hyperlipidemia, unspecified: Secondary | ICD-10-CM

## 2023-06-25 DIAGNOSIS — M5030 Other cervical disc degeneration, unspecified cervical region: Secondary | ICD-10-CM | POA: Diagnosis not present

## 2023-06-25 DIAGNOSIS — I2510 Atherosclerotic heart disease of native coronary artery without angina pectoris: Secondary | ICD-10-CM | POA: Diagnosis not present

## 2023-06-25 NOTE — Progress Notes (Signed)
Office Visit    Patient Name: Carol Cox Date of Encounter: 06/25/2023  PCP:  Barbie Banner, MD   [facility]  Cardiologist:  Lance Muss, MD  Advanced Practice Provider:  No care team member to display Electrophysiologist:  None   HPI    Carol Cox is a 72 y.o. female who has a strong family history of CAD including her dad at age 66 and sister at age 50 with MI, maternal uncle with MI in his 72s, chronic low back pain, hypertension and hyperlipidemia presents today for follow-up appointment.  She had chest discomfort in 2014 which resulted in a cardiac catheterization which showed mild nonobstructive CAD.  She was thought to have noncardiac chest pain.  She had knee surgery a few years ago and then shoulder surgery more recently.  She has ankle tendinitis as well.  Stationary bike had worsened her knee pain.  Over the past few years, she developed worsening spinal stenosis and had surgery.  Walking has been limited.  She may need back surgery.  Back pain was limiting her walking and doing yard work.  She was seen 03/2022 by Dr. Eldridge Dace and at that time denied chest pain, dizziness, leg edema, nitro use, orthopnea, palpitations, paroxysmal nocturnal dyspnea, shortness of breath, and syncope.  She saw me February 2024, she states that things have been doing fine but she went to an ER a week ago Monday night for back pain, prior usual.  She usually has back pain but it is lower.  She has been doing some heavy lifting.  Shoulders were also sore when she went to bed that night and she woke up at 2 AM with chest pain in the center of her chest.  She sat up in her chair and it eased up somewhat.  She debated on going to the ER but due to her family history of CAD she decided to go.  Troponin negative and EKG negative.  She has not had any further chest pains.  She was given a muscle relaxer for her back pain but she has not been taking it due to her  glaucoma.  There are a lot of medications that she cannot take.  She describes the chest pain as constant dull pain.  Nothing helps the pain.  It was gone by the time she went to the ER.  Today, she tells me that she has not had any more chest pains and no ER. She is on repatha and zetia. LDL 80. There is room diet wise and she is exercising. Tendinitis in her ankles and it kills her when she walks. Back pain with surgeries. If she tried to push too hard it causes a lot of pain. She has an ellipitcal.  Blood pressure and heart rate are well-controlled today.  Provided a lipid-lowering diet sheet.  Reports no shortness of breath nor dyspnea on exertion. Reports no chest pain, pressure, or tightness. No edema, orthopnea, PND. Reports no palpitations.   Past Medical History    Past Medical History:  Diagnosis Date   Allergy    seasonal   Arthritis    "knee, left hip, ankles"    Borderline glaucoma    CAD (coronary artery disease)    Mild nonobstructive CAD by cath 02/06/13 (25% mid LAD), normal EF   Chronic low back pain    Colitis    Coronary arteriosclerosis    DDD (degenerative disc disease), lumbar    Degeneration  of intervertebral disc of lumbar region    Family history of adverse reaction to anesthesia    mother has problems with nausea and vomiting    Glaucoma    History of colitis    02/ 2016  acute infectious colitis-- resolved   History of total knee arthroplasty    Hyperglycemia    Hyperlipidemia    Left ovarian cyst    Low back pain    with bilateral hip radiation    Lumbar radiculopathy    Lumbar spondylolysis    Obesity    Osteoporosis    ospenia of hips   Pain in joint of left shoulder    Pain of right hip joint    PONV (postoperative nausea and vomiting)    SEVERE   Past Surgical History:  Procedure Laterality Date   ABDOMINAL EXPOSURE N/A 05/01/2020   Procedure: ABDOMINAL EXPOSURE;  Surgeon: Larina Earthly, MD;  Location: [facility];  Service: Vascular;  Laterality:  N/A;   ANTERIOR LAT LUMBAR FUSION N/A 05/01/2020   Procedure: ANTERIOR LATERAL LUMBAR FUSION L4-S1;  Surgeon: Venita Lick, MD;  Location: [facility];  Service: Orthopedics;  Laterality: N/A;  4.5 hrs Dr. Arbie Cookey to do approach tap block with exparel   CARPAL TUNNEL RELEASE Bilateral 1990's   KNEE ARTHROSCOPY W/ MENISCECTOMY Left 07/2014   LEFT HEART CATHETERIZATION WITH CORONARY ANGIOGRAM N/A 02/06/2013   Procedure: LEFT HEART CATHETERIZATION WITH CORONARY ANGIOGRAM;  Surgeon: Iran Ouch, MD;  Location: [facility];  Service: Cardiovascular;  Laterality: N/A;  non-obstruction 25% mLAD,  normal LVF , ef 65-70%   left knee meniscus surgery      NASAL SINUS SURGERY     ovarian cyst     left ovary   ROTATOR CUFF REPAIR Right 10/2016   TOTAL KNEE ARTHROPLASTY Left 03/26/2015   Procedure: LEFT TOTAL KNEE ARTHROPLASTY;  Surgeon: Durene Romans, MD;  Location: [facility];  Service: Orthopedics;  Laterality: Left;    Allergies  Allergies  Allergen Reactions   Atorvastatin Other (See Comments)    Leg pain   Rosuvastatin Other (See Comments)    Leg pain    EKGs/Labs/Other Studies Reviewed:   The following studies were reviewed today:  Echo 03/31/23 IMPRESSIONS     1. Left ventricular ejection fraction, by estimation, is 60 to 65%. Left  ventricular ejection fraction by 3D volume is 64 %. The left ventricle has  normal function. The left ventricle has no regional wall motion  abnormalities. Left ventricular diastolic   parameters are consistent with Grade I diastolic dysfunction (impaired  relaxation).   2. Right ventricular systolic function is normal. The right ventricular  size is normal. There is normal pulmonary artery systolic pressure.   3. A small pericardial effusion is present. The pericardial effusion is  anterior to the right ventricle.   4. The mitral valve is normal in structure. Mild mitral valve  regurgitation. No evidence of mitral stenosis.   5. The aortic valve is normal  in structure. Aortic valve regurgitation is  not visualized. No aortic stenosis is present.   6. The inferior vena cava is normal in size with greater than 50%  respiratory variability, suggesting right atrial pressure of 3 mmHg.   7. Agitated saline contrast bubble study was negative, with no evidence  of any interatrial shunt.   FINDINGS   Left Ventricle: Left ventricular ejection fraction, by estimation, is 60  to 65%. Left ventricular ejection fraction by 3D volume is 64 %. The  left  ventricle has normal function. The left ventricle has no regional wall  motion abnormalities. The left  ventricular internal cavity size was normal in size. There is no left  ventricular hypertrophy. Left ventricular diastolic parameters are  consistent with Grade I diastolic dysfunction (impaired relaxation).   Right Ventricle: The right ventricular size is normal. No increase in  right ventricular wall thickness. Right ventricular systolic function is  normal. There is normal pulmonary artery systolic pressure. The tricuspid  regurgitant velocity is 2.51 m/s, and   with an assumed right atrial pressure of 3 mmHg, the estimated right  ventricular systolic pressure is 28.2 mmHg.   Left Atrium: Left atrial size was normal in size.   Right Atrium: Right atrial size was normal in size.   Pericardium: A small pericardial effusion is present. The pericardial  effusion is anterior to the right ventricle.   Mitral Valve: The mitral valve is normal in structure. Mild mitral valve  regurgitation. No evidence of mitral valve stenosis.   Tricuspid Valve: The tricuspid valve is normal in structure. Tricuspid  valve regurgitation is not demonstrated. No evidence of tricuspid  stenosis.   Aortic Valve: The aortic valve is normal in structure. Aortic valve  regurgitation is not visualized. No aortic stenosis is present.   Pulmonic Valve: The pulmonic valve was not well visualized. Pulmonic valve   regurgitation is trivial. No evidence of pulmonic stenosis.   Aorta: The aortic root is normal in size and structure.   Venous: The inferior vena cava is normal in size with greater than 50%  respiratory variability, suggesting right atrial pressure of 3 mmHg.   IAS/Shunts: No atrial level shunt detected by color flow Doppler. Agitated  saline contrast was given intravenously to evaluate for intracardiac  shunting. Agitated saline contrast bubble study was negative, with no  evidence of any interatrial shunt.   EKG:  EKG is not ordered today.    Recent Labs: 11/24/2022: Hemoglobin 15.2; Platelets 245 12/04/2022: BUN 23; Creatinine, Ser 1.01; Potassium 4.6; Sodium 141 02/24/2023: ALT 33  Recent Lipid Panel    Component Value Date/Time   CHOL 162 04/27/2023 0927   TRIG 223 (H) 04/27/2023 0927   HDL 45 04/27/2023 0927   CHOLHDL 3.6 04/27/2023 0927   CHOLHDL 4 04/22/2015 0909   VLDL 35.4 04/22/2015 0909   LDLCALC 80 04/27/2023 0927   LDLDIRECT 106.3 05/09/2013 0921     Review of Systems      All other systems reviewed and are otherwise negative except as noted above.  Physical Exam    VS:  BP 132/84   Pulse 81   Ht 5\' 5"  (1.651 m)   Wt 194 lb 9.6 oz (88.3 kg)   LMP  (LMP Unknown)   SpO2 96%   BMI 32.38 kg/m  , BMI Body mass index is 32.38 kg/m.  Wt Readings from Last 3 Encounters:  06/25/23 194 lb 9.6 oz (88.3 kg)  12/04/22 196 lb 6.4 oz (89.1 kg)  11/24/22 190 lb (86.2 kg)     GEN: Well nourished, well developed, in no acute distress. HEENT: normal. Neck: Supple, no JVD, carotid bruits, or masses. Cardiac: RRR, no murmurs, rubs, or gallops. No clubbing, cyanosis, edema.  Radials/PT 2+ and equal bilaterally.  Respiratory:  Respirations regular and unlabored, clear to auscultation bilaterally. GI: Soft, nontender, nondistended. MS: No deformity or atrophy. Skin: Warm and dry, no rash. Neuro:  Strength and sensation are intact. Psych: Normal affect.  Assessment  & Plan  CAD -coronary CTA reviewed -strong family history of CAD -Recommend to continue current medication which includes aspirin 81 mg daily, Zetia 10 mg daily, lisinopril 20 mg daily, fish oil 1000 mg daily  Hyperlipidemia -working on diet -fish oil 1000mg  and zetia 10mg  daily -try back walking some but the back holds her up -LDL 72 but triglycerides 234  -discussed dietary changes.    HTN -slightly elevated today, 146/98, 144/98 on retake -continue current medication regimen -continue monitoring at home  DDD -chronic back pain -nerve stimulator candidate, but declined -patient would prefer to continue conservative treatment at this time        Disposition: Follow up 6 months with Lance Muss, MD or APP.   Signed, Sharlene Dory, PA-C 06/25/2023, 4:37 PM [facility]

## 2023-06-25 NOTE — Patient Instructions (Signed)
Medication Instructions:  Your physician recommends that you continue on your current medications as directed. Please refer to the Current Medication list given to you today.  *If you need a refill on your cardiac medications before your next appointment, please call your pharmacy*  Lab Work: None ordered If you have labs (blood work) drawn today and your tests are completely normal, you will receive your results only by: MyChart Message (if you have MyChart) OR A paper copy in the mail If you have any lab test that is abnormal or we need to change your treatment, we will call you to review the results.  Follow-Up: At [facility], you and your health needs are our priority.  As part of our continuing mission to provide you with exceptional heart care, we have created designated Provider Care Teams.  These Care Teams include your primary Cardiologist (physician) and Advanced Practice Providers (APPs -  Physician Assistants and Nurse Practitioners) who all work together to provide you with the care you need, when you need it.  Your next appointment:   6 month(s)  Provider:   Dr Candy Sledge or Dr Izora Ribas  Heart-Healthy Eating Plan Many factors influence your heart health, including eating and exercise habits. Heart health is also called coronary health. Coronary risk increases with abnormal blood fat (lipid) levels. A heart-healthy eating plan includes limiting unhealthy fats, increasing healthy fats, limiting salt (sodium) intake, and making other diet and lifestyle changes. What is my plan? Your health care provider may recommend that: You limit your fat intake to _________% or less of your total calories each day. You limit your saturated fat intake to _________% or less of your total calories each day. You limit the amount of cholesterol in your diet to less than _________ mg per day. You limit the amount of sodium in your diet to less than _________ mg per day. What are  tips for following this plan? Cooking Cook foods using methods other than frying. Baking, boiling, grilling, and broiling are all good options. Other ways to reduce fat include: Removing the skin from poultry. Removing all visible fats from meats. Steaming vegetables in water or broth. Meal planning  At meals, imagine dividing your plate into fourths: Fill one-half of your plate with vegetables and green salads. Fill one-fourth of your plate with whole grains. Fill one-fourth of your plate with lean protein foods. Eat 2-4 cups of vegetables per day. One cup of vegetables equals 1 cup (91 g) broccoli or cauliflower florets, 2 medium carrots, 1 large bell pepper, 1 large sweet potato, 1 large tomato, 1 medium white potato, 2 cups (150 g) raw leafy greens. Eat 1-2 cups of fruit per day. One cup of fruit equals 1 small apple, 1 large banana, 1 cup (237 g) mixed fruit, 1 large orange,  cup (82 g) dried fruit, 1 cup (240 mL) 100% fruit juice. Eat more foods that contain soluble fiber. Examples include apples, broccoli, carrots, beans, peas, and barley. Aim to get 25-30 g of fiber per day. Increase your consumption of legumes, nuts, and seeds to 4-5 servings per week. One serving of dried beans or legumes equals  cup (90 g) cooked, 1 serving of nuts is  oz (12 almonds, 24 pistachios, or 7 walnut halves), and 1 serving of seeds equals  oz (8 g). Fats Choose healthy fats more often. Choose monounsaturated and polyunsaturated fats, such as olive and canola oils, avocado oil, flaxseeds, walnuts, almonds, and seeds. Eat more omega-3 fats. Choose salmon,  mackerel, sardines, tuna, flaxseed oil, and ground flaxseeds. Aim to eat fish at least 2 times each week. Check food labels carefully to identify foods with trans fats or high amounts of saturated fat. Limit saturated fats. These are found in animal products, such as meats, butter, and cream. Plant sources of saturated fats include palm oil, palm  kernel oil, and coconut oil. Avoid foods with partially hydrogenated oils in them. These contain trans fats. Examples are stick margarine, some tub margarines, cookies, crackers, and other baked goods. Avoid fried foods. General information Eat more home-cooked food and less restaurant, buffet, and fast food. Limit or avoid alcohol. Limit foods that are high in added sugar and simple starches such as foods made using white refined flour (white breads, pastries, sweets). Lose weight if you are overweight. Losing just 5-10% of your body weight can help your overall health and prevent diseases such as diabetes and heart disease. Monitor your sodium intake, especially if you have high blood pressure. Talk with your health care provider about your sodium intake. Try to incorporate more vegetarian meals weekly. What foods should I eat? Fruits All fresh, canned (in natural juice), or frozen fruits. Vegetables Fresh or frozen vegetables (raw, steamed, roasted, or grilled). Green salads. Grains Most grains. Choose whole wheat and whole grains most of the time. Rice and pasta, including brown rice and pastas made with whole wheat. Meats and other proteins Lean, well-trimmed beef, veal, pork, and lamb. Chicken and [place] without skin. All fish and shellfish. Wild duck, rabbit, pheasant, and venison. Egg whites or low-cholesterol egg substitutes. Dried beans, peas, lentils, and tofu. Seeds and most nuts. Dairy Low-fat or nonfat cheeses, including ricotta and mozzarella. Skim or 1% milk (liquid, powdered, or evaporated). Buttermilk made with low-fat milk. Nonfat or low-fat yogurt. Fats and oils Non-hydrogenated (trans-free) margarines. Vegetable oils, including soybean, sesame, sunflower, olive, avocado, peanut, safflower, corn, canola, and cottonseed. Salad dressings or mayonnaise made with a vegetable oil. Beverages Water (mineral or sparkling). Coffee and tea. Unsweetened ice tea. Diet  beverages. Sweets and desserts Sherbet, gelatin, and fruit ice. Small amounts of dark chocolate. Limit all sweets and desserts. Seasonings and condiments All seasonings and condiments. The items listed above may not be a complete list of foods and beverages you can eat. Contact a dietitian for more options. What foods should I avoid? Fruits Canned fruit in heavy syrup. Fruit in cream or butter sauce. Fried fruit. Limit coconut. Vegetables Vegetables cooked in cheese, cream, or butter sauce. Fried vegetables. Grains Breads made with saturated or trans fats, oils, or whole milk. Croissants. Sweet rolls. Donuts. High-fat crackers, such as cheese crackers and chips. Meats and other proteins Fatty meats, such as hot dogs, ribs, sausage, bacon, rib-eye roast or steak. High-fat deli meats, such as salami and bologna. Caviar. Domestic duck and goose. Organ meats, such as liver. Dairy Cream, sour cream, cream cheese, and creamed cottage cheese. Whole-milk cheeses. Whole or 2% milk (liquid, evaporated, or condensed). Whole buttermilk. Cream sauce or high-fat cheese sauce. Whole-milk yogurt. Fats and oils Meat fat, or shortening. Cocoa butter, hydrogenated oils, palm oil, coconut oil, palm kernel oil. Solid fats and shortenings, including bacon fat, salt pork, lard, and butter. Nondairy cream substitutes. Salad dressings with cheese or sour cream. Beverages Regular sodas and any drinks with added sugar. Sweets and desserts Frosting. Pudding. Cookies. Cakes. Pies. Milk chocolate or white chocolate. Buttered syrups. Full-fat ice cream or ice cream drinks. The items listed above may not be a complete list  of foods and beverages to avoid. Contact a dietitian for more information. Summary Heart-healthy meal planning includes limiting unhealthy fats, increasing healthy fats, limiting salt (sodium) intake and making other diet and lifestyle changes. Lose weight if you are overweight. Losing just 5-10% of  your body weight can help your overall health and prevent diseases such as diabetes and heart disease. Focus on eating a balance of foods, including fruits and vegetables, low-fat or nonfat dairy, lean protein, nuts and legumes, whole grains, and heart-healthy oils and fats. This information is not intended to replace advice given to you by your health care provider. Make sure you discuss any questions you have with your health care provider. Document Revised: 11/17/2021 Document Reviewed: 11/17/2021 Elsevier Patient Education  2024 [employer] Low-Sodium Eating Plan Salt (sodium) helps you keep a healthy balance of fluids in your body. Too much sodium can raise your blood pressure. It can also cause fluid and waste to be held in your body. Your health care provider or dietitian may recommend a low-sodium eating plan if you have high blood pressure (hypertension), kidney disease, liver disease, or heart failure. Eating less sodium can help lower your blood pressure and reduce swelling. It can also protect your heart, liver, and kidneys. What are tips for following this plan? Reading food labels  Check food labels for the amount of sodium per serving. If you eat more than one serving, you must multiply the listed amount by the number of servings. Choose foods with less than 140 milligrams (mg) of sodium per serving. Avoid foods with 300 mg of sodium or more per serving. Always check how much sodium is in a product, even if the label says "unsalted" or "no salt added." Shopping  Buy products labeled as "low-sodium" or "no salt added." Buy fresh foods. Avoid canned foods and pre-made or frozen meals. Avoid canned, cured, or processed meats. Buy breads that have less than 80 mg of sodium per slice. Cooking  Eat more home-cooked food. Try to eat less restaurant, buffet, and fast food. Try not to add salt when you cook. Use salt-free seasonings or herbs instead of table salt or sea salt. Check  with your provider or pharmacist before using salt substitutes. Cook with plant-based oils, such as canola, sunflower, or olive oil. Meal planning When eating at a restaurant, ask if your food can be made with less salt or no salt. Avoid dishes labeled as brined, pickled, cured, or smoked. Avoid dishes made with soy sauce, miso, or teriyaki sauce. Avoid foods that have monosodium glutamate (MSG) in them. MSG may be added to some restaurant food, sauces, soups, bouillon, and canned foods. Make meals that can be grilled, baked, poached, roasted, or steamed. These are often made with less sodium. General information Try to limit your sodium intake to 1,500-2,300 mg each day, or the amount told by your provider. What foods should I eat? Fruits Fresh, frozen, or canned fruit. Fruit juice. Vegetables Fresh or frozen vegetables. "No salt added" canned vegetables. "No salt added" tomato sauce and paste. Low-sodium or reduced-sodium tomato and vegetable juice. Grains Low-sodium cereals, such as oats, puffed wheat and rice, and shredded wheat. Low-sodium crackers. Unsalted rice. Unsalted pasta. Low-sodium bread. Whole grain breads and whole grain pasta. Meats and other proteins Fresh or frozen meat, poultry, seafood, and fish. These should have no added salt. Low-sodium canned tuna and salmon. Unsalted nuts. Dried peas, beans, and lentils without added salt. Unsalted canned beans. Eggs. Unsalted nut butters. Dairy Milk.  Soy milk. Cheese that is naturally low in sodium, such as ricotta cheese, fresh mozzarella, or Swiss cheese. Low-sodium or reduced-sodium cheese. Cream cheese. Yogurt. Seasonings and condiments Fresh and dried herbs and spices. Salt-free seasonings. Low-sodium mustard and ketchup. Sodium-free salad dressing. Sodium-free light mayonnaise. Fresh or refrigerated horseradish. Lemon juice. Vinegar. Other foods Homemade, reduced-sodium, or low-sodium soups. Unsalted popcorn and pretzels.  Low-salt or salt-free chips. The items listed above may not be all the foods and drinks you can have. Talk to a dietitian to learn more. What foods should I avoid? Vegetables Sauerkraut, pickled vegetables, and relishes. Olives. [place] fries. Onion rings. Regular canned vegetables, except low-sodium or reduced-sodium items. Regular canned tomato sauce and paste. Regular tomato and vegetable juice. Frozen vegetables in sauces. Grains Instant hot cereals. Bread stuffing, pancake, and biscuit mixes. Croutons. Seasoned rice or pasta mixes. Noodle soup cups. Boxed or frozen macaroni and cheese. Regular salted crackers. Self-rising flour. Meats and other proteins Meat or fish that is salted, canned, smoked, spiced, or pickled. Precooked or cured meat, such as sausages or meat loaves. Tomasa Blase. Ham. Pepperoni. Hot dogs. Corned beef. Chipped beef. Salt pork. Jerky. Pickled herring, anchovies, and sardines. Regular canned tuna. Salted nuts. Dairy Processed cheese and cheese spreads. Hard cheeses. Cheese curds. Blue cheese. Feta cheese. String cheese. Regular cottage cheese. Buttermilk. Canned milk. Fats and oils Salted butter. Regular margarine. Ghee. Bacon fat. Seasonings and condiments Onion salt, garlic salt, seasoned salt, table salt, and sea salt. Canned and packaged gravies. Worcestershire sauce. Tartar sauce. Barbecue sauce. Teriyaki sauce. Soy sauce, including reduced-sodium soy sauce. Steak sauce. Fish sauce. Oyster sauce. Cocktail sauce. Horseradish that you find on the shelf. Regular ketchup and mustard. Meat flavorings and tenderizers. Bouillon cubes. Hot sauce. Pre-made or packaged marinades. Pre-made or packaged taco seasonings. Relishes. Regular salad dressings. Salsa. Other foods Salted popcorn and pretzels. Corn chips and puffs. Potato and tortilla chips. Canned or dried soups. Pizza. Frozen entrees and pot pies. The items listed above may not be all the foods and drinks you should avoid. Talk  to a dietitian to learn more. This information is not intended to replace advice given to you by your health care provider. Make sure you discuss any questions you have with your health care provider. Document Revised: 10/29/2022 Document Reviewed: 10/29/2022 Elsevier Patient Education  2024 [employer].

## 2023-07-08 ENCOUNTER — Ambulatory Visit (AMBULATORY_SURGERY_CENTER): Payer: Medicare PPO

## 2023-07-08 VITALS — Ht 65.0 in | Wt 195.0 lb

## 2023-07-08 DIAGNOSIS — Z86010 Personal history of colonic polyps: Secondary | ICD-10-CM

## 2023-07-08 MED ORDER — NA SULFATE-K SULFATE-MG SULF 17.5-3.13-1.6 GM/177ML PO SOLN: 1.0000 | Freq: Once | ORAL | 0 refills | Status: AC

## 2023-07-08 NOTE — Progress Notes (Signed)

## 2023-07-16 ENCOUNTER — Encounter: Payer: Self-pay | Admitting: Internal Medicine

## 2023-07-29 ENCOUNTER — Ambulatory Visit (AMBULATORY_SURGERY_CENTER): Payer: Medicare PPO | Admitting: Internal Medicine

## 2023-07-29 ENCOUNTER — Encounter: Payer: Self-pay | Admitting: Internal Medicine

## 2023-07-29 VITALS — BP 131/71 | HR 68 | Temp 97.3°F | Resp 19 | Ht 65.0 in | Wt 195.0 lb

## 2023-07-29 DIAGNOSIS — Z860101 Personal history of adenomatous and serrated colon polyps: Secondary | ICD-10-CM

## 2023-07-29 DIAGNOSIS — Z09 Encounter for follow-up examination after completed treatment for conditions other than malignant neoplasm: Secondary | ICD-10-CM

## 2023-07-29 DIAGNOSIS — Z860100 Personal history of colon polyps, unspecified: Secondary | ICD-10-CM

## 2023-07-29 MED ORDER — SODIUM CHLORIDE 0.9 % IV SOLN: 500.0000 mL | Freq: Once | INTRAVENOUS | Status: DC

## 2023-07-29 NOTE — Op Note (Signed)
[facility] Patient Name: Carol Cox Procedure Date: 07/29/2023 8:59 AM MRN: [id number] Endoscopist: Wilhemina Bonito. Marina Goodell , MD, [id number] Age: 72 Referring MD:  Date of Birth: 1951-06-08 Gender: Female Account #: [id number] Procedure:                Colonoscopy Indications:              High risk colon cancer surveillance: Personal                            history of multiple (3 or more) adenomas. prior                            2005, 12, 19 Medicines:                Monitored Anesthesia Care Procedure:                Pre-Anesthesia Assessment:                           - Prior to the procedure, a History and Physical                            was performed, and patient medications and                            allergies were reviewed. The patient's tolerance of                            previous anesthesia was also reviewed. The risks                            and benefits of the procedure and the sedation                            options and risks were discussed with the patient.                            All questions were answered, and informed consent                            was obtained. Prior Anticoagulants: The patient has                            taken no anticoagulant or antiplatelet agents. ASA                            Grade Assessment: II - A patient with mild systemic                            disease. After reviewing the risks and benefits,                            the patient was deemed in satisfactory condition to  undergo the procedure.                           After obtaining informed consent, the colonoscope                            was passed under direct vision. Throughout the                            procedure, the patient's blood pressure, pulse, and                            oxygen saturations were monitored continuously. The                            Olympus Scope SN: [id number] was introduced through                             the anus and advanced to the the cecum, identified                            by appendiceal orifice and ileocecal valve. The                            ileocecal valve, appendiceal orifice, and rectum                            were photographed. The quality of the bowel                            preparation was excellent. The colonoscopy was                            performed without difficulty. The patient tolerated                            the procedure well. The bowel preparation used was                            SUPREP via split dose instruction. Scope In: 9:16:31 AM Scope Out: 9:31:54 AM Scope Withdrawal Time: 0 hours 10 minutes 39 seconds  Total Procedure Duration: 0 hours 15 minutes 23 seconds  Findings:                 The entire examined colon appeared normal on direct                            and retroflexion views. Complications:            No immediate complications. Estimated blood loss:                            None. Estimated Blood Loss:     Estimated blood loss: none. Impression:               - The entire examined  colon is normal on direct and                            retroflexion views.                           - No specimens collected. Recommendation:           - Repeat colonoscopy in 5 years for surveillance                            (personal hx multiple polyps).                           - Patient has a contact number available for                            emergencies. The signs and symptoms of potential                            delayed complications were discussed with the                            patient. Return to normal activities tomorrow.                            Written discharge instructions were provided to the                            patient.                           - Resume previous diet.                           - Continue present medications. Wilhemina Bonito. Marina Goodell, MD 07/29/2023 9:38:46 AM This report  has been signed electronically.

## 2023-07-29 NOTE — Progress Notes (Signed)
Patient states she has had severe PONV previously. IV Zofran given prophylactically.  Uneventful anesthetic. Report to pacu rn. Vss. Care resumed by rn.

## 2023-07-29 NOTE — Progress Notes (Signed)
HISTORY OF PRESENT ILLNESS:  Carol Cox is a 72 y.o. female with a history of multiple adenomatous colon polyps.  Now for surveillance colonoscopy  REVIEW OF SYSTEMS:  All non-GI ROS negative except for  Past Medical History:  Diagnosis Date   Allergy    seasonal   Arthritis    "knee, left hip, ankles"    Borderline glaucoma    CAD (coronary artery disease)    Mild nonobstructive CAD by cath 02/06/13 (25% mid LAD), normal EF   Chronic low back pain    Colitis    Coronary arteriosclerosis    DDD (degenerative disc disease), lumbar    Degeneration of intervertebral disc of lumbar region    Family history of adverse reaction to anesthesia    mother has problems with nausea and vomiting    Glaucoma    History of colitis    02/ 2016  acute infectious colitis-- resolved   History of total knee arthroplasty    Hyperglycemia    Hyperlipidemia    Left ovarian cyst    Low back pain    with bilateral hip radiation    Lumbar radiculopathy    Lumbar spondylolysis    Obesity    Osteoporosis    ospenia of hips   Pain in joint of left shoulder    Pain of right hip joint    PONV (postoperative nausea and vomiting)    SEVERE    Past Surgical History:  Procedure Laterality Date   ABDOMINAL EXPOSURE N/A 05/01/2020   Procedure: ABDOMINAL EXPOSURE;  Surgeon: Larina Earthly, MD;  Location: [facility] OR;  Service: Vascular;  Laterality: N/A;   ANTERIOR LAT LUMBAR FUSION N/A 05/01/2020   Procedure: ANTERIOR LATERAL LUMBAR FUSION L4-S1;  Surgeon: Venita Lick, MD;  Location: [facility] OR;  Service: Orthopedics;  Laterality: N/A;  4.5 hrs Dr. Arbie Cookey to do approach tap block with exparel   CARPAL TUNNEL RELEASE Bilateral 1990's   KNEE ARTHROSCOPY W/ MENISCECTOMY Left 07/2014   LEFT HEART CATHETERIZATION WITH CORONARY ANGIOGRAM N/A 02/06/2013   Procedure: LEFT HEART CATHETERIZATION WITH CORONARY ANGIOGRAM;  Surgeon: Iran Ouch, MD;  Location: [facility] CATH LAB;  Service: Cardiovascular;  Laterality: N/A;   non-obstruction 25% mLAD,  normal LVF , ef 65-70%   left knee meniscus surgery      NASAL SINUS SURGERY     ovarian cyst     left ovary   ROTATOR CUFF REPAIR Right 10/2016   TOTAL KNEE ARTHROPLASTY Left 03/26/2015   Procedure: LEFT TOTAL KNEE ARTHROPLASTY;  Surgeon: Durene Romans, MD;  Location: [facility] ORS;  Service: Orthopedics;  Laterality: Left;    Social History [name]  reports that she has never smoked. She has never used smokeless tobacco. She reports that she does not drink alcohol and does not use drugs.  family history includes CAD in her sister; CAD (age of onset: 89) in her maternal uncle; CAD (age of onset: 22) in her maternal uncle; CVA in her mother; Heart attack in her father; Heart attack (age of onset: 42) in her maternal uncle; Heart attack (age of onset: 23) in her maternal uncle; Heart attack (age of onset: 7) in her sister; Sudden death (age of onset: 94) in her father.  Allergies  Allergen Reactions   Atorvastatin Other (See Comments)    Leg pain   Rosuvastatin Other (See Comments)    Leg pain       PHYSICAL EXAMINATION: Vital signs: BP (!) 153/88   Pulse  74   Temp (!) 97.3 F (36.3 C)   Ht 5\' 5"  (1.651 m)   Wt 195 lb (88.5 kg)   LMP  (LMP Unknown)   SpO2 96%   BMI 32.45 kg/m  General: Well-developed, well-nourished, no acute distress HEENT: Sclerae are anicteric, conjunctiva pink. Oral mucosa intact Lungs: Clear Heart: Regular Abdomen: soft, nontender, nondistended, no obvious ascites, no peritoneal signs, normal bowel sounds. No organomegaly. Extremities: No edema Psychiatric: alert and oriented x3. Cooperative     ASSESSMENT:   Personal history of multiple adenomatous colon polyps  PLAN:  Surveillance colonoscopy

## 2023-07-29 NOTE — Patient Instructions (Signed)
Resume all of your previous medications today as ordered.   Read all of your discharge instructions as ordered. You will need another colonoscopy in 5 years.   YOU HAD AN ENDOSCOPIC PROCEDURE TODAY AT THE [facility]:   Refer to the procedure report that was given to you for any specific questions about what was found during the examination.  If the procedure report does not answer your questions, please call your gastroenterologist to clarify.  If you requested that your care partner not be given the details of your procedure findings, then the procedure report has been included in a sealed envelope for you to review at your convenience later.  YOU SHOULD EXPECT: Some feelings of bloating in the abdomen. Passage of more gas than usual.  Walking can help get rid of the air that was put into your GI tract during the procedure and reduce the bloating. If you had a lower endoscopy (such as a colonoscopy or flexible sigmoidoscopy) you may notice spotting of blood in your stool or on the toilet paper. If you underwent a bowel prep for your procedure, you may not have a normal bowel movement for a few days.  Please Note:  You might notice some irritation and congestion in your nose or some drainage.  This is from the oxygen used during your procedure.  There is no need for concern and it should clear up in a day or so.  SYMPTOMS TO REPORT IMMEDIATELY:  Following lower endoscopy (colonoscopy or flexible sigmoidoscopy):  Excessive amounts of blood in the stool  Significant tenderness or worsening of abdominal pains  Swelling of the abdomen that is new, acute  Fever of 100F or higher   For urgent or emergent issues, a gastroenterologist can be reached at any hour by calling [phone]. Do not use MyChart messaging for urgent concerns.    DIET:  We do recommend a small meal at first, but then you may proceed to your regular diet.  Drink plenty of fluids but you should avoid alcoholic  beverages for 24 hours.  ACTIVITY:  You should plan to take it easy for the rest of today and you should NOT DRIVE or use heavy machinery until tomorrow (because of the sedation medicines used during the test).    FOLLOW UP: Our staff will call the number listed on your records the next business day following your procedure.  We will call around 7:15- 8:00 am to check on you and address any questions or concerns that you may have regarding the information given to you following your procedure. If we do not reach you, we will leave a message.       SIGNATURES/CONFIDENTIALITY: You and/or your care partner have signed paperwork which will be entered into your electronic medical record.  These signatures attest to the fact that that the information above on your After Visit Summary has been reviewed and is understood.  Full responsibility of the confidentiality of this discharge information lies with you and/or your care-partner.

## 2023-07-30 ENCOUNTER — Telehealth: Payer: Self-pay | Admitting: *Deleted

## 2023-07-30 NOTE — Telephone Encounter (Signed)
No answer on  follow up call. Left message.   

## 2023-09-03 ENCOUNTER — Encounter (HOSPITAL_BASED_OUTPATIENT_CLINIC_OR_DEPARTMENT_OTHER): Payer: Medicare PPO

## 2023-09-03 ENCOUNTER — Other Ambulatory Visit (HOSPITAL_BASED_OUTPATIENT_CLINIC_OR_DEPARTMENT_OTHER): Payer: Self-pay | Admitting: Medical

## 2023-09-03 DIAGNOSIS — M79604 Pain in right leg: Secondary | ICD-10-CM

## 2023-10-13 ENCOUNTER — Ambulatory Visit: Payer: Medicare PPO | Admitting: Interventional Cardiology

## 2024-01-14 ENCOUNTER — Telehealth (HOSPITAL_BASED_OUTPATIENT_CLINIC_OR_DEPARTMENT_OTHER): Payer: Self-pay | Admitting: Physical Therapy

## 2024-01-14 NOTE — Telephone Encounter (Signed)
 Called and LVM to remind patient of upcoming physical therapy evaluation appointment. Requested call back to confirm appointment attendance.

## 2024-01-16 ENCOUNTER — Other Ambulatory Visit: Payer: Self-pay | Admitting: Interventional Cardiology

## 2024-01-17 ENCOUNTER — Ambulatory Visit (HOSPITAL_BASED_OUTPATIENT_CLINIC_OR_DEPARTMENT_OTHER): Attending: Orthopedic Surgery | Admitting: Physical Therapy

## 2024-01-17 ENCOUNTER — Other Ambulatory Visit: Payer: Self-pay

## 2024-01-17 ENCOUNTER — Encounter (HOSPITAL_BASED_OUTPATIENT_CLINIC_OR_DEPARTMENT_OTHER): Payer: Self-pay | Admitting: Physical Therapy

## 2024-01-17 DIAGNOSIS — M5459 Other low back pain: Secondary | ICD-10-CM | POA: Diagnosis present

## 2024-01-17 DIAGNOSIS — M6281 Muscle weakness (generalized): Secondary | ICD-10-CM

## 2024-01-17 NOTE — Therapy (Signed)
 OUTPATIENT PHYSICAL THERAPY THORACOLUMBAR EVALUATION   Patient Name: Carol Cox MRN: [id number] DOB:03-May-1951, 73 y.o., female Today's Date: 01/17/2024  END OF SESSION:  PT End of Session - 01/17/24 1038     Visit Number 1    Number of Visits 12    Date for PT Re-Evaluation 03/03/24    Authorization Type [employer]    Progress Note Due on Visit 10    PT Start Time 0835    PT Stop Time 0910    PT Time Calculation (min) 35 min    Activity Tolerance Patient tolerated treatment well    Behavior During Therapy [facility] for tasks assessed/performed             Past Medical History:  Diagnosis Date   Allergy    seasonal   Arthritis    "knee, left hip, ankles"    Borderline glaucoma    CAD (coronary artery disease)    Mild nonobstructive CAD by cath 02/06/13 (25% mid LAD), normal EF   Chronic low back pain    Colitis    Coronary arteriosclerosis    DDD (degenerative disc disease), lumbar    Degeneration of intervertebral disc of lumbar region    Family history of adverse reaction to anesthesia    mother has problems with nausea and vomiting    Glaucoma    History of colitis    02/ 2016  acute infectious colitis-- resolved   History of total knee arthroplasty    Hyperglycemia    Hyperlipidemia    Left ovarian cyst    Low back pain    with bilateral hip radiation    Lumbar radiculopathy    Lumbar spondylolysis    Obesity    Osteoporosis    ospenia of hips   Pain in joint of left shoulder    Pain of right hip joint    PONV (postoperative nausea and vomiting)    SEVERE   Past Surgical History:  Procedure Laterality Date   ABDOMINAL EXPOSURE N/A 05/01/2020   Procedure: ABDOMINAL EXPOSURE;  Surgeon: Larina Earthly, MD;  Location: [facility] OR;  Service: Vascular;  Laterality: N/A;   ANTERIOR LAT LUMBAR FUSION N/A 05/01/2020   Procedure: ANTERIOR LATERAL LUMBAR FUSION L4-S1;  Surgeon: Venita Lick, MD;  Location: [facility] OR;  Service: Orthopedics;  Laterality: N/A;  4.5 hrs Dr.  Arbie Cookey to do approach tap block with exparel   CARPAL TUNNEL RELEASE Bilateral 1990's   KNEE ARTHROSCOPY W/ MENISCECTOMY Left 07/2014   LEFT HEART CATHETERIZATION WITH CORONARY ANGIOGRAM N/A 02/06/2013   Procedure: LEFT HEART CATHETERIZATION WITH CORONARY ANGIOGRAM;  Surgeon: Iran Ouch, MD;  Location: [facility] CATH LAB;  Service: Cardiovascular;  Laterality: N/A;  non-obstruction 25% mLAD,  normal LVF , ef 65-70%   left knee meniscus surgery      NASAL SINUS SURGERY     ovarian cyst     left ovary   ROTATOR CUFF REPAIR Right 10/2016   TOTAL KNEE ARTHROPLASTY Left 03/26/2015   Procedure: LEFT TOTAL KNEE ARTHROPLASTY;  Surgeon: Durene Romans, MD;  Location: [facility] ORS;  Service: Orthopedics;  Laterality: Left;   Patient Active Problem List   Diagnosis Date Noted   S/P lumbar fusion 05/01/2020   Hypertension 11/20/2019   Family history of early CAD 07/15/2017   Elevated blood pressure reading in office without diagnosis of hypertension 07/15/2017   Obese 03/27/2015   S/P left TKA 03/26/2015   S/P knee replacement 03/26/2015   Colitis, acute 12/01/2014  Weight loss, unintentional 12/01/2014   Adnexal cyst 12/01/2014   Elevated fasting glucose 12/01/2014   CAD (coronary artery disease) 12/01/2014   Osteoarthritis 12/01/2014   Vasovagal attack 02/28/2013   Hyperlipidemia 02/28/2013    PCP: Benedetto Goad MD  REFERRING PROVIDER: Venita Lick MD  REFERRING DIAG: M54.50 (ICD-10-CM) - Low back pain, unspecified   Rationale for Evaluation and Treatment: Rehabilitation  THERAPY DIAG:  Other low back pain  Muscle weakness (generalized)  ONSET DATE: 4 yr past year exacerbated  SUBJECTIVE:                                                                                                                                                                                           SUBJECTIVE STATEMENT: Have has 2 back surgerys.  MRI Wed.  May be facing another surgery for back. Nerve pain down  both legs with left leg sometimes go numb 3/7 days. Also tendonitis R>L.  Have own backyard pool. Wakes almost hourly due to pain  PERTINENT HISTORY:  L TKR R Knee OA  PAIN:  Are you having pain? Yes: NPRS scale: current 3-4/10; wosrt 10/10; least  0/10 Pain location:  Lumbar spine across to hips into buttock with radiation into legs; Pain description: pain is constant ache Aggravating factors: sidelying increase leg pain Relieving factors: sitting, resting.  Unable to take some meds  PRECAUTIONS: Fall  RED FLAGS: None   WEIGHT BEARING RESTRICTIONS: No  FALLS:  Has patient fallen in last 6 months? No  LIVING ENVIRONMENT: Lives with: lives with their spouse Lives in: House/apartment Stairs:  3-4 step going into home no handrail Has following equipment at home: None  OCCUPATION: retired.  PLOF: Independent  PATIENT GOALS: walk, work in yard, relaxing at night, decrease pain  NEXT MD VISIT: as needed  OBJECTIVE:  Note: Objective measures were completed at Evaluation unless otherwise noted.  DIAGNOSTIC FINDINGS:  None recent  PATIENT SURVEYS:  Modified Oswestry 44%   COGNITION: Overall cognitive status: Within functional limits for tasks assessed     SENSATION: Numbness LLE   POSTURE:  slight forward head  PALPATION: No TTP  LUMBAR ROM:   AROM eval  Flexion FT to mid shin P!  Extension Full P!  Right lateral flexion Limited 25%P!  Left lateral flexion Limited 25%P!  Right rotation   Left rotation    (Blank rows = not tested)  LOWER EXTREMITY ROM:     wfl  LOWER EXTREMITY MMT:    MMT Right eval Left eval  Hip flexion 29.6 28.2  Hip extension    Hip abduction 24.4 22.5  Hip adduction  Hip internal rotation    Hip external rotation    Knee flexion    Knee extension 41.0 32.6  Ankle dorsiflexion    Ankle plantarflexion    Ankle inversion    Ankle eversion     (Blank rows = not tested)  LUMBAR SPECIAL TESTS:  Slump test:  Positive  FUNCTIONAL TESTS:  5 times sit to stand: may be tested later as tolerated.  Not tolerated today Timed up and go (TUG): 14.52 4 stage balance: passed 1&2; Tandem 8s; SLS 5s  GAIT: Distance walked: 400 ft Assistive device utilized: None Level of assistance: Complete Independence Comments: slowed cadence  TREATMENT Eval Self care:  Pt to continue with walking daily doing chores and shopping for exercise .  Instruction  on importance of resting.  Edu on lumbar dysfunction and nerve impingement /nuerological symptoms in leg.  Sleeping positions                                                                                                                      PATIENT EDUCATION:  Education details: Discussed eval findings, rehab rationale, aquatic program progression/POC and pools in area. Patient is in agreement  Person educated: Patient Education method: Explanation Education comprehension: verbalized understanding  HOME EXERCISE PROGRAM: TBA  ASSESSMENT:  CLINICAL IMPRESSION: Patient is a 73 y.o. F who was seen today for physical therapy evaluation and treatment for Low back pain. Pt has already had to LB surgery x 4 years ago and reports she may need another.  MRI and next MD appt scheduled in the next 1-2 weeks.  She is active senior and states she does not let the pain slow her down too much. Objective testing demonstrate weakness in LE and core with L>R. She also reports LLE numbness more frequent then right occurring 3/7 days a week.  Her pain does wake her almost hourly at night.  She will benefit from skilled Physical Therapy intervention. Will plan to begin in aquatics as she will benefit from the properties of water to progress towards goals with transition to land after decisions made regarding possible surgical intervention.  OBJECTIVE IMPAIRMENTS: decreased activity tolerance, difficulty walking, decreased strength, and pain.   ACTIVITY LIMITATIONS: carrying,  lifting, bending, sitting, standing, squatting, sleeping, transfers, and locomotion level  PARTICIPATION LIMITATIONS: community activity and yard work, playing with grand children  PERSONAL FACTORS: 1-2 comorbidities: see problem lost  are also affecting patient's functional outcome.   REHAB POTENTIAL: Good  CLINICAL DECISION MAKING: Evolving/moderate complexity  EVALUATION COMPLEXITY: Moderate   GOALS: Goals reviewed with patient? Yes  SHORT TERM GOALS: Target date: 02/08/24  Pt will tolerate full aquatic sessions consistently without increase in pain and with improving function to demonstrate good toleration and effectiveness of intervention.  Baseline: Goal status: INITIAL  2.  Pt will report reduction of pain and numbness with submersion by 2-3 NPRS Baseline:  Goal status: INITIAL  3.  Pt will recognize the benefit of aquatic intervention using the properties of water  for strengthening and pain management Baseline:  Goal status: INITIAL   LONG TERM GOALS: Target date: 03/03/24  Pt to improve on ODI to 31% or less ([facility]) to demonstrate statistically significant Improvement in function. Baseline: 20/45=44% Goal status: INITIAL  2.  Pt will reports increased hours of uninterrupted sleep to 3-4 hours due to decreasing pain in legs Baseline: maybe 1 hour Goal status: INITIAL  3.  Pt will report reduction of overall back pain during day by at least 50% Baseline: see chart Goal status: INITIAL  4.  Pt will improve strength in all areas listed by at least 5 lb to demonstrate improved overall physical function Baseline:  Goal status: INITIAL     PLAN:  PT FREQUENCY: 1-2x/week  PT DURATION: 6 weeks  PLANNED INTERVENTIONS: 97164- PT Re-evaluation, 97110-Therapeutic exercises, 97530- Therapeutic activity, 97112- Neuromuscular re-education, 97535- Self Care, [id number]- Manual therapy, [id number]- Gait training, [phone]- Orthotic Fit/training, [phone]- Aquatic Therapy, [phone]-  Ionotophoresis 4mg /ml Dexamethasone, Patient/Family education, Balance training, Stair training, Taping, Dry Needling, Joint mobilization, DME instructions, Cryotherapy, and Moist heat.  PLAN FOR NEXT SESSION: Aquatics only:   Corrie Dandy Ma[place]omedi) Vinette Crites MPT 01/17/24 10:40 AM [facility] [address] [place], [place], [address] Phone: [phone]   Fax:  [phone]  Referring diagnosis? M54.50 (ICD-10-CM) - Low back pain, unspecified  Treatment diagnosis? (if different than referring diagnosis) no What was this (referring dx) caused by? []  Surgery []  Fall [x]  Ongoing issue []  Arthritis []  Other: ____________  Laterality: []  Rt []  Lt [x]  Both  Check all possible CPT codes:  *CHOOSE 10 OR LESS*    See Planned Interventions listed in the Plan section of the Evaluation.

## 2024-01-18 ENCOUNTER — Encounter (HOSPITAL_BASED_OUTPATIENT_CLINIC_OR_DEPARTMENT_OTHER): Payer: Self-pay | Admitting: Physical Therapy

## 2024-01-18 ENCOUNTER — Ambulatory Visit (HOSPITAL_BASED_OUTPATIENT_CLINIC_OR_DEPARTMENT_OTHER): Admitting: Physical Therapy

## 2024-01-18 DIAGNOSIS — M5459 Other low back pain: Secondary | ICD-10-CM

## 2024-01-18 DIAGNOSIS — M6281 Muscle weakness (generalized): Secondary | ICD-10-CM

## 2024-01-18 NOTE — Therapy (Signed)
 OUTPATIENT PHYSICAL THERAPY THORACOLUMBAR TREATMENT   Patient Name: Carol Cox MRN: [id number] DOB:1951/01/28, 73 y.o., female Today's Date: 01/18/2024  END OF SESSION:  PT End of Session - 01/18/24 1014     Visit Number 2    Number of Visits 12    Date for PT Re-Evaluation 03/03/24    Authorization Type [employer] MCR    Progress Note Due on Visit 10    PT Start Time 1018    PT Stop Time 1056    PT Time Calculation (min) 38 min    Activity Tolerance Patient tolerated treatment well    Behavior During Therapy WFL for tasks assessed/performed             Past Medical History:  Diagnosis Date   Allergy    seasonal   Arthritis    "knee, left hip, ankles"    Borderline glaucoma    CAD (coronary artery disease)    Mild nonobstructive CAD by cath 02/06/13 (25% mid LAD), normal EF   Chronic low back pain    Colitis    Coronary arteriosclerosis    DDD (degenerative disc disease), lumbar    Degeneration of intervertebral disc of lumbar region    Family history of adverse reaction to anesthesia    mother has problems with nausea and vomiting    Glaucoma    History of colitis    02/ 2016  acute infectious colitis-- resolved   History of total knee arthroplasty    Hyperglycemia    Hyperlipidemia    Left ovarian cyst    Low back pain    with bilateral hip radiation    Lumbar radiculopathy    Lumbar spondylolysis    Obesity    Osteoporosis    ospenia of hips   Pain in joint of left shoulder    Pain of right hip joint    PONV (postoperative nausea and vomiting)    SEVERE   Past Surgical History:  Procedure Laterality Date   ABDOMINAL EXPOSURE N/A 05/01/2020   Procedure: ABDOMINAL EXPOSURE;  Surgeon: Larina Earthly, MD;  Location: [facility] OR;  Service: Vascular;  Laterality: N/A;   ANTERIOR LAT LUMBAR FUSION N/A 05/01/2020   Procedure: ANTERIOR LATERAL LUMBAR FUSION L4-S1;  Surgeon: Venita Lick, MD;  Location: [facility] OR;  Service: Orthopedics;  Laterality: N/A;  4.5 hrs Dr.  Arbie Cookey to do approach tap block with exparel   CARPAL TUNNEL RELEASE Bilateral 1990's   KNEE ARTHROSCOPY W/ MENISCECTOMY Left 07/2014   LEFT HEART CATHETERIZATION WITH CORONARY ANGIOGRAM N/A 02/06/2013   Procedure: LEFT HEART CATHETERIZATION WITH CORONARY ANGIOGRAM;  Surgeon: Iran Ouch, MD;  Location: [facility] CATH LAB;  Service: Cardiovascular;  Laterality: N/A;  non-obstruction 25% mLAD,  normal LVF , ef 65-70%   left knee meniscus surgery      NASAL SINUS SURGERY     ovarian cyst     left ovary   ROTATOR CUFF REPAIR Right 10/2016   TOTAL KNEE ARTHROPLASTY Left 03/26/2015   Procedure: LEFT TOTAL KNEE ARTHROPLASTY;  Surgeon: Durene Romans, MD;  Location: [facility] ORS;  Service: Orthopedics;  Laterality: Left;   Patient Active Problem List   Diagnosis Date Noted   S/P lumbar fusion 05/01/2020   Hypertension 11/20/2019   Family history of early CAD 07/15/2017   Elevated blood pressure reading in office without diagnosis of hypertension 07/15/2017   Obese 03/27/2015   S/P left TKA 03/26/2015   S/P knee replacement 03/26/2015   Colitis, acute 12/01/2014  Weight loss, unintentional 12/01/2014   Adnexal cyst 12/01/2014   Elevated fasting glucose 12/01/2014   CAD (coronary artery disease) 12/01/2014   Osteoarthritis 12/01/2014   Vasovagal attack 02/28/2013   Hyperlipidemia 02/28/2013    PCP: Benedetto Goad MD  REFERRING PROVIDER: Venita Lick MD  REFERRING DIAG: M54.50 (ICD-10-CM) - Low back pain, unspecified   Rationale for Evaluation and Treatment: Rehabilitation  THERAPY DIAG:  Other low back pain  Muscle weakness (generalized)  ONSET DATE: 4 yr past year exacerbated  SUBJECTIVE:                                                                                                                                                                                           SUBJECTIVE STATEMENT: Pt reports that she has her MRI tomorrow, follow up with MD on 4/2.  She states that the nerve  pain in her LLE last night was bad; wakes often during night due to pain. She has an outdoor pool at home. She is seeing podiatrist today for R ankle.   From evaluation:  Have has 2 back surgerys.  MRI Wed.  May be facing another surgery for back. Nerve pain down both legs with left leg sometimes go numb 3/7 days. Also tendonitis R>L.  Have own backyard pool. Wakes almost hourly due to pain  PERTINENT HISTORY:  L TKR R Knee OA  PAIN:  Are you having pain? Yes: NPRS scale: current 3/10; " mild" Pain location:  lower back Pain description: constant ache Aggravating factors: sidelying increase leg pain Relieving factors: sitting, resting.  Unable to take some meds  PRECAUTIONS: Fall  RED FLAGS: None   WEIGHT BEARING RESTRICTIONS: No  FALLS:  Has patient fallen in last 6 months? No  LIVING ENVIRONMENT: Lives with: lives with their spouse Lives in: House/apartment Stairs:  3-4 step going into home no handrail Has following equipment at home: None  OCCUPATION: retired.  PLOF: Independent  PATIENT GOALS: walk, work in yard, relaxing at night, decrease pain  NEXT MD VISIT: as needed  OBJECTIVE:  Note: Objective measures were completed at Evaluation unless otherwise noted.  DIAGNOSTIC FINDINGS:  None recent  PATIENT SURVEYS:  Modified Oswestry 44%   COGNITION: Overall cognitive status: Within functional limits for tasks assessed     SENSATION: Numbness LLE   POSTURE:  slight forward head  PALPATION: No TTP  LUMBAR ROM:   AROM eval  Flexion FT to mid shin P!  Extension Full P!  Right lateral flexion Limited 25%P!  Left lateral flexion Limited 25%P!  Right rotation   Left rotation    (Blank rows = not tested)  LOWER EXTREMITY ROM:     wfl  LOWER EXTREMITY MMT:    MMT Right eval Left eval  Hip flexion 29.6 28.2  Hip extension    Hip abduction 24.4 22.5  Hip adduction    Hip internal rotation    Hip external rotation    Knee flexion    Knee  extension 41.0 32.6  Ankle dorsiflexion    Ankle plantarflexion    Ankle inversion    Ankle eversion     (Blank rows = not tested)  LUMBAR SPECIAL TESTS:  Slump test: Positive  FUNCTIONAL TESTS:  5 times sit to stand: may be tested later as tolerated.  Not tolerated today Timed up and go (TUG): 14.52 4 stage balance: passed 1&2; Tandem 8s; SLS 5s  GAIT: Distance walked: 400 ft Assistive device utilized: None Level of assistance: Complete Independence Comments: slowed cadence  TREATMENT [facility] Adult PT Treatment:                                                DATE: 01/18/24 Pt seen for aquatic therapy today.  Treatment took place in water 3.5-4.75 ft in depth at the [facility] pool. Temp of water was 91.  Pt entered/exited the pool via stairs independently with bilat rail.  - Intro to aquatic therapy principles - unsupported walking forward/ backward x 3 laps - unsupported side stepping -> with arm addct/ abdct -> with rainbow hand floats 4 laps total - farmer carry with single yellow hand  - UE on wall:   hip add/abd x 10 each LE ; hip flexion /extension x 10 each LE - TrA set with short hollow noodle pull down to thighs x 10  - straddling noodle with UE support on corner: cycling, hip abdct/addct, cross country ski -> cycling without support  - return to walking backwards/ forwards with reciprocal arm swing   - hamstring stretch with foot on 2nd step, 15s x 2  Pt requires the buoyancy and hydrostatic pressure of water for support, and to offload joints by unweighting joint load by at least 50 % in navel deep water and by at least 75-80% in chest to neck deep water.  Viscosity of the water is needed for resistance of strengthening. Water current perturbations provides challenge to standing balance requiring increased core activation.   PATIENT EDUCATION:  Education details: Intro to aquatic therapy Person educated: Patient Education method: Explanation Education  comprehension: verbalized understanding  HOME EXERCISE PROGRAM: TBA  ASSESSMENT:  CLINICAL IMPRESSION: Pt demonstrates safety and independence in aquatic setting with therapist instructing from deck. Pt demonstrates confidence in setting, moving throughout all depths easily.  Pt is directed through various movement patterns and trials in standing position. Overall good tolerance to aquatic exercise this visit with no increase in symptoms.   Goals are ongoing.     Initial impression: Patient is a 73 y.o. F who was seen today for physical therapy evaluation and treatment for Low back pain. Pt has already had to LB surgery x 4 years ago and reports she may need another.  MRI and next MD appt scheduled in the next 1-2 weeks.  She is active senior and states she does not let the pain slow her down too much. Objective testing demonstrate weakness in LE and core with L>R. She also reports LLE numbness more frequent then right occurring  3/7 days a week.  Her pain does wake her almost hourly at night.  She will benefit from skilled Physical Therapy intervention. Will plan to begin in aquatics as she will benefit from the properties of water to progress towards goals with transition to land after decisions made regarding possible surgical intervention.  OBJECTIVE IMPAIRMENTS: decreased activity tolerance, difficulty walking, decreased strength, and pain.   ACTIVITY LIMITATIONS: carrying, lifting, bending, sitting, standing, squatting, sleeping, transfers, and locomotion level  PARTICIPATION LIMITATIONS: community activity and yard work, playing with grand children  PERSONAL FACTORS: 1-2 comorbidities: see problem lost  are also affecting patient's functional outcome.   REHAB POTENTIAL: Good  CLINICAL DECISION MAKING: Evolving/moderate complexity  EVALUATION COMPLEXITY: Moderate   GOALS: Goals reviewed with patient? Yes  SHORT TERM GOALS: Target date: 02/08/24  Pt will tolerate full aquatic  sessions consistently without increase in pain and with improving function to demonstrate good toleration and effectiveness of intervention.  Baseline: Goal status: INITIAL  2.  Pt will report reduction of pain and numbness with submersion by 2-3 NPRS Baseline:  Goal status: INITIAL  3.  Pt will recognize the benefit of aquatic intervention using the properties of water for strengthening and pain management Baseline:  Goal status: INITIAL   LONG TERM GOALS: Target date: 03/03/24  Pt to improve on ODI to 31% or less (MCID) to demonstrate statistically significant Improvement in function. Baseline: 20/45=44% Goal status: INITIAL  2.  Pt will reports increased hours of uninterrupted sleep to 3-4 hours due to decreasing pain in legs Baseline: maybe 1 hour Goal status: INITIAL  3.  Pt will report reduction of overall back pain during day by at least 50% Baseline: see chart Goal status: INITIAL  4.  Pt will improve strength in all areas listed by at least 5 lb to demonstrate improved overall physical function Baseline:  Goal status: INITIAL     PLAN:  PT FREQUENCY: 1-2x/week  PT DURATION: 6 weeks  PLANNED INTERVENTIONS: 97164- PT Re-evaluation, 97110-Therapeutic exercises, 97530- Therapeutic activity, 97112- Neuromuscular re-education, 97535- Self Care, 09811- Manual therapy, [id number]- Gait training, [phone]- Orthotic Fit/training, [phone]- Aquatic Therapy, [phone]- Ionotophoresis 4mg /ml Dexamethasone, Patient/Family education, Balance training, Stair training, Taping, Dry Needling, Joint mobilization, DME instructions, Cryotherapy, and Moist heat.  PLAN FOR NEXT SESSION: Aquatics only:   Mayer Camel, PTA 01/18/24 11:02 AM [facility] [address] [place], [place], [address] Phone: [phone]   Fax:  [phone]   Referring diagnosis? M54.50 (ICD-10-CM) - Low back pain, unspecified  Treatment diagnosis? (if  different than referring diagnosis) no What was this (referring dx) caused by? []  Surgery []  Fall [x]  Ongoing issue []  Arthritis []  Other: ____________  Laterality: []  Rt []  Lt [x]  Both  Check all possible CPT codes:  *CHOOSE 10 OR LESS*    See Planned Interventions listed in the Plan section of the Evaluation.

## 2024-01-21 ENCOUNTER — Encounter (HOSPITAL_BASED_OUTPATIENT_CLINIC_OR_DEPARTMENT_OTHER): Payer: Self-pay | Admitting: Physical Therapy

## 2024-01-21 ENCOUNTER — Ambulatory Visit (HOSPITAL_BASED_OUTPATIENT_CLINIC_OR_DEPARTMENT_OTHER): Admitting: Physical Therapy

## 2024-01-21 DIAGNOSIS — M5459 Other low back pain: Secondary | ICD-10-CM | POA: Diagnosis not present

## 2024-01-21 DIAGNOSIS — M6281 Muscle weakness (generalized): Secondary | ICD-10-CM

## 2024-01-21 NOTE — Therapy (Signed)
 OUTPATIENT PHYSICAL THERAPY THORACOLUMBAR TREATMENT   Patient Name: Carol Cox MRN: [id number] DOB:Jun 26, 1951, 73 y.o., female Today's Date: 01/21/2024  END OF SESSION:  PT End of Session - 01/21/24 1601     Visit Number 3    Number of Visits 12    Date for PT Re-Evaluation 03/03/24    Authorization Type [employer] MCR    Progress Note Due on Visit 10    PT Start Time 1600    PT Stop Time 1639    PT Time Calculation (min) 39 min    Activity Tolerance Patient tolerated treatment well    Behavior During Therapy WFL for tasks assessed/performed             Past Medical History:  Diagnosis Date   Allergy    seasonal   Arthritis    "knee, left hip, ankles"    Borderline glaucoma    CAD (coronary artery disease)    Mild nonobstructive CAD by cath 02/06/13 (25% mid LAD), normal EF   Chronic low back pain    Colitis    Coronary arteriosclerosis    DDD (degenerative disc disease), lumbar    Degeneration of intervertebral disc of lumbar region    Family history of adverse reaction to anesthesia    mother has problems with nausea and vomiting    Glaucoma    History of colitis    02/ 2016  acute infectious colitis-- resolved   History of total knee arthroplasty    Hyperglycemia    Hyperlipidemia    Left ovarian cyst    Low back pain    with bilateral hip radiation    Lumbar radiculopathy    Lumbar spondylolysis    Obesity    Osteoporosis    ospenia of hips   Pain in joint of left shoulder    Pain of right hip joint    PONV (postoperative nausea and vomiting)    SEVERE   Past Surgical History:  Procedure Laterality Date   ABDOMINAL EXPOSURE N/A 05/01/2020   Procedure: ABDOMINAL EXPOSURE;  Surgeon: Larina Earthly, MD;  Location: [facility] OR;  Service: Vascular;  Laterality: N/A;   ANTERIOR LAT LUMBAR FUSION N/A 05/01/2020   Procedure: ANTERIOR LATERAL LUMBAR FUSION L4-S1;  Surgeon: Venita Lick, MD;  Location: [facility] OR;  Service: Orthopedics;  Laterality: N/A;  4.5 hrs Dr.  Arbie Cookey to do approach tap block with exparel   CARPAL TUNNEL RELEASE Bilateral 1990's   KNEE ARTHROSCOPY W/ MENISCECTOMY Left 07/2014   LEFT HEART CATHETERIZATION WITH CORONARY ANGIOGRAM N/A 02/06/2013   Procedure: LEFT HEART CATHETERIZATION WITH CORONARY ANGIOGRAM;  Surgeon: Iran Ouch, MD;  Location: [facility] CATH LAB;  Service: Cardiovascular;  Laterality: N/A;  non-obstruction 25% mLAD,  normal LVF , ef 65-70%   left knee meniscus surgery      NASAL SINUS SURGERY     ovarian cyst     left ovary   ROTATOR CUFF REPAIR Right 10/2016   TOTAL KNEE ARTHROPLASTY Left 03/26/2015   Procedure: LEFT TOTAL KNEE ARTHROPLASTY;  Surgeon: Durene Romans, MD;  Location: [facility] ORS;  Service: Orthopedics;  Laterality: Left;   Patient Active Problem List   Diagnosis Date Noted   S/P lumbar fusion 05/01/2020   Hypertension 11/20/2019   Family history of early CAD 07/15/2017   Elevated blood pressure reading in office without diagnosis of hypertension 07/15/2017   Obese 03/27/2015   S/P left TKA 03/26/2015   S/P knee replacement 03/26/2015   Colitis, acute 12/01/2014  Weight loss, unintentional 12/01/2014   Adnexal cyst 12/01/2014   Elevated fasting glucose 12/01/2014   CAD (coronary artery disease) 12/01/2014   Osteoarthritis 12/01/2014   Vasovagal attack 02/28/2013   Hyperlipidemia 02/28/2013    PCP: Benedetto Goad MD  REFERRING PROVIDER: Venita Lick MD  REFERRING DIAG: M54.50 (ICD-10-CM) - Low back pain, unspecified   Rationale for Evaluation and Treatment: Rehabilitation  THERAPY DIAG:  Other low back pain  Muscle weakness (generalized)  ONSET DATE: 4 yr past year exacerbated  SUBJECTIVE:                                                                                                                                                                                           SUBJECTIVE STATEMENT: Pt is on day 3 of prednisone pack. Not sleeping well.  R ankle pain is better. Back is a little  worse from playing pickle ball this morning with grandkids.  She reports she was not sore, just tired after last session.    From evaluation:  Have has 2 back surgerys.  MRI Wed.  May be facing another surgery for back. Nerve pain down both legs with left leg sometimes go numb 3/7 days. Also tendonitis R>L.  Have own backyard pool. Wakes almost hourly due to pain  PERTINENT HISTORY:  L TKR R Knee OA  PAIN:  Are you having pain? Yes: NPRS scale: current 5/10 Pain location:  lower back Pain description: constant ache Aggravating factors: sidelying increase leg pain Relieving factors: sitting, resting.  Unable to take some meds  PRECAUTIONS: Fall  RED FLAGS: None   WEIGHT BEARING RESTRICTIONS: No  FALLS:  Has patient fallen in last 6 months? No  LIVING ENVIRONMENT: Lives with: lives with their spouse Lives in: House/apartment Stairs:  3-4 step going into home no handrail Has following equipment at home: None  OCCUPATION: retired.  PLOF: Independent  PATIENT GOALS: walk, work in yard, relaxing at night, decrease pain  NEXT MD VISIT: as needed  OBJECTIVE:  Note: Objective measures were completed at Evaluation unless otherwise noted.  DIAGNOSTIC FINDINGS:  None recent  PATIENT SURVEYS:  Modified Oswestry 44%   COGNITION: Overall cognitive status: Within functional limits for tasks assessed     SENSATION: Numbness LLE   POSTURE:  slight forward head  PALPATION: No TTP  LUMBAR ROM:   AROM eval  Flexion FT to mid shin P!  Extension Full P!  Right lateral flexion Limited 25%P!  Left lateral flexion Limited 25%P!  Right rotation   Left rotation    (Blank rows = not tested)  LOWER EXTREMITY ROM:     wfl  LOWER EXTREMITY MMT:    MMT Right eval Left eval  Hip flexion 29.6 28.2  Hip extension    Hip abduction 24.4 22.5  Hip adduction    Hip internal rotation    Hip external rotation    Knee flexion    Knee extension 41.0 32.6  Ankle  dorsiflexion    Ankle plantarflexion    Ankle inversion    Ankle eversion     (Blank rows = not tested)  LUMBAR SPECIAL TESTS:  Slump test: Positive  FUNCTIONAL TESTS:  5 times sit to stand: may be tested later as tolerated.  Not tolerated today Timed up and go (TUG): 14.52 4 stage balance: passed 1&2; Tandem 8s; SLS 5s  GAIT: Distance walked: 400 ft Assistive device utilized: None Level of assistance: Complete Independence Comments: slowed cadence  TREATMENT [facility] Adult PT Treatment:                                                DATE: 01/21/24 Pt seen for aquatic therapy today.  Treatment took place in water 3.5-4.75 ft in depth at the [facility] pool. Temp of water was 91.  Pt entered/exited the pool via stairs independently with bilat rail.  - unsupported walking forward/ backward x 3 laps - unsupported side stepping -> with arm addct/ abdct with yellow hand floats - farmer carry with bilat --> single yellow hand floats marching forward/ backward - UE on yellow hand floats :   hip add/abd 2 x 10 each LE ; hip flexion /extension x 10 each LE (good balance challenge)  - TrA set with short hollow noodle pull down to thighs x 10  - straddling noodle without support: cycling with breast stroke arms, hip abdct/addct, cross country ski; return to cycling-with increased speed  - hamstring stretch with foot on 2nd step, 15s x 2  Pt requires the buoyancy and hydrostatic pressure of water for support, and to offload joints by unweighting joint load by at least 50 % in navel deep water and by at least 75-80% in chest to neck deep water.  Viscosity of the water is needed for resistance of strengthening. Water current perturbations provides challenge to standing balance requiring increased core activation.   PATIENT EDUCATION:  Education details: Intro to aquatic therapy Person educated: Patient Education method: Explanation Education comprehension: verbalized  understanding  HOME EXERCISE PROGRAM: TBA  ASSESSMENT:  CLINICAL IMPRESSION: Good tolerance to aquatic exercise without production of increase in symptoms.  Progressed exercises with balance challenge with LE exercises, as well as increased resistance   Goals are ongoing.     Initial impression: Patient is a 73 y.o. F who was seen today for physical therapy evaluation and treatment for Low back pain. Pt has already had to LB surgery x 4 years ago and reports she may need another.  MRI and next MD appt scheduled in the next 1-2 weeks.  She is active senior and states she does not let the pain slow her down too much. Objective testing demonstrate weakness in LE and core with L>R. She also reports LLE numbness more frequent then right occurring 3/7 days a week.  Her pain does wake her almost hourly at night.  She will benefit from skilled Physical Therapy intervention. Will plan to begin in aquatics as she will benefit from the properties of water to progress  towards goals with transition to land after decisions made regarding possible surgical intervention.  OBJECTIVE IMPAIRMENTS: decreased activity tolerance, difficulty walking, decreased strength, and pain.   ACTIVITY LIMITATIONS: carrying, lifting, bending, sitting, standing, squatting, sleeping, transfers, and locomotion level  PARTICIPATION LIMITATIONS: community activity and yard work, playing with grand children  PERSONAL FACTORS: 1-2 comorbidities: see problem lost  are also affecting patient's functional outcome.   REHAB POTENTIAL: Good  CLINICAL DECISION MAKING: Evolving/moderate complexity  EVALUATION COMPLEXITY: Moderate   GOALS: Goals reviewed with patient? Yes  SHORT TERM GOALS: Target date: 02/08/24  Pt will tolerate full aquatic sessions consistently without increase in pain and with improving function to demonstrate good toleration and effectiveness of intervention.  Baseline: Goal status: INITIAL  2.  Pt will  report reduction of pain and numbness with submersion by 2-3 NPRS Baseline:  Goal status: INITIAL  3.  Pt will recognize the benefit of aquatic intervention using the properties of water for strengthening and pain management Baseline:  Goal status: INITIAL   LONG TERM GOALS: Target date: 03/03/24  Pt to improve on ODI to 31% or less (MCID) to demonstrate statistically significant Improvement in function. Baseline: 20/45=44% Goal status: INITIAL  2.  Pt will reports increased hours of uninterrupted sleep to 3-4 hours due to decreasing pain in legs Baseline: maybe 1 hour Goal status: INITIAL  3.  Pt will report reduction of overall back pain during day by at least 50% Baseline: see chart Goal status: INITIAL  4.  Pt will improve strength in all areas listed by at least 5 lb to demonstrate improved overall physical function Baseline:  Goal status: INITIAL     PLAN:  PT FREQUENCY: 1-2x/week  PT DURATION: 6 weeks  PLANNED INTERVENTIONS: 97164- PT Re-evaluation, 97110-Therapeutic exercises, 97530- Therapeutic activity, 97112- Neuromuscular re-education, 97535- Self Care, 16109- Manual therapy, [id number]- Gait training, [phone]- Orthotic Fit/training, [phone]- Aquatic Therapy, [phone]- Ionotophoresis 4mg /ml Dexamethasone, Patient/Family education, Balance training, Stair training, Taping, Dry Needling, Joint mobilization, DME instructions, Cryotherapy, and Moist heat.  PLAN FOR NEXT SESSION: Aquatics only:   Mayer Camel, PTA 01/21/24 4:49 PM [facility] [address] [place], [place], [address] Phone: [phone]   Fax:  [phone]    Referring diagnosis? M54.50 (ICD-10-CM) - Low back pain, unspecified  Treatment diagnosis? (if different than referring diagnosis) no What was this (referring dx) caused by? []  Surgery []  Fall [x]  Ongoing issue []  Arthritis []  Other: ____________  Laterality: []  Rt []  Lt [x]   Both  Check all possible CPT codes:  *CHOOSE 10 OR LESS*    See Planned Interventions listed in the Plan section of the Evaluation.

## 2024-01-28 ENCOUNTER — Ambulatory Visit (HOSPITAL_BASED_OUTPATIENT_CLINIC_OR_DEPARTMENT_OTHER): Attending: Orthopedic Surgery | Admitting: Physical Therapy

## 2024-01-28 ENCOUNTER — Encounter (HOSPITAL_BASED_OUTPATIENT_CLINIC_OR_DEPARTMENT_OTHER): Payer: Self-pay | Admitting: Physical Therapy

## 2024-01-28 DIAGNOSIS — M5459 Other low back pain: Secondary | ICD-10-CM | POA: Diagnosis present

## 2024-01-28 DIAGNOSIS — M6281 Muscle weakness (generalized): Secondary | ICD-10-CM | POA: Diagnosis present

## 2024-01-28 NOTE — Therapy (Signed)
 OUTPATIENT PHYSICAL THERAPY THORACOLUMBAR TREATMENT   Patient Name: Carol Cox MRN: [id number] DOB:February 04, 1951, 73 y.o., female Today's Date: 01/28/2024  END OF SESSION:  PT End of Session - 01/28/24 1208     Visit Number 4    Number of Visits 12    Date for PT Re-Evaluation 03/03/24    Authorization Type [employer] MCR    Progress Note Due on Visit 10    PT Start Time 0846    PT Stop Time 0925    PT Time Calculation (min) 39 min    Activity Tolerance Patient tolerated treatment well    Behavior During Therapy [facility] for tasks assessed/performed              Past Medical History:  Diagnosis Date   Allergy    seasonal   Arthritis    "knee, left hip, ankles"    Borderline glaucoma    CAD (coronary artery disease)    Mild nonobstructive CAD by cath 02/06/13 (25% mid LAD), normal EF   Chronic low back pain    Colitis    Coronary arteriosclerosis    DDD (degenerative disc disease), lumbar    Degeneration of intervertebral disc of lumbar region    Family history of adverse reaction to anesthesia    mother has problems with nausea and vomiting    Glaucoma    History of colitis    02/ 2016  acute infectious colitis-- resolved   History of total knee arthroplasty    Hyperglycemia    Hyperlipidemia    Left ovarian cyst    Low back pain    with bilateral hip radiation    Lumbar radiculopathy    Lumbar spondylolysis    Obesity    Osteoporosis    ospenia of hips   Pain in joint of left shoulder    Pain of right hip joint    PONV (postoperative nausea and vomiting)    SEVERE   Past Surgical History:  Procedure Laterality Date   ABDOMINAL EXPOSURE N/A 05/01/2020   Procedure: ABDOMINAL EXPOSURE;  Surgeon: Larina Earthly, MD;  Location: [facility] OR;  Service: Vascular;  Laterality: N/A;   ANTERIOR LAT LUMBAR FUSION N/A 05/01/2020   Procedure: ANTERIOR LATERAL LUMBAR FUSION L4-S1;  Surgeon: Venita Lick, MD;  Location: [facility] OR;  Service: Orthopedics;  Laterality: N/A;  4.5 hrs Dr.  Arbie Cookey to do approach tap block with exparel   CARPAL TUNNEL RELEASE Bilateral 1990's   KNEE ARTHROSCOPY W/ MENISCECTOMY Left 07/2014   LEFT HEART CATHETERIZATION WITH CORONARY ANGIOGRAM N/A 02/06/2013   Procedure: LEFT HEART CATHETERIZATION WITH CORONARY ANGIOGRAM;  Surgeon: Iran Ouch, MD;  Location: [facility] CATH LAB;  Service: Cardiovascular;  Laterality: N/A;  non-obstruction 25% mLAD,  normal LVF , ef 65-70%   left knee meniscus surgery      NASAL SINUS SURGERY     ovarian cyst     left ovary   ROTATOR CUFF REPAIR Right 10/2016   TOTAL KNEE ARTHROPLASTY Left 03/26/2015   Procedure: LEFT TOTAL KNEE ARTHROPLASTY;  Surgeon: Durene Romans, MD;  Location: [facility] ORS;  Service: Orthopedics;  Laterality: Left;   Patient Active Problem List   Diagnosis Date Noted   S/P lumbar fusion 05/01/2020   Hypertension 11/20/2019   Family history of early CAD 07/15/2017   Elevated blood pressure reading in office without diagnosis of hypertension 07/15/2017   Obese 03/27/2015   S/P left TKA 03/26/2015   S/P knee replacement 03/26/2015   Colitis, acute 12/01/2014  Weight loss, unintentional 12/01/2014   Adnexal cyst 12/01/2014   Elevated fasting glucose 12/01/2014   CAD (coronary artery disease) 12/01/2014   Osteoarthritis 12/01/2014   Vasovagal attack 02/28/2013   Hyperlipidemia 02/28/2013    PCP: Benedetto Goad MD  REFERRING PROVIDER: Venita Lick MD  REFERRING DIAG: M54.50 (ICD-10-CM) - Low back pain, unspecified   Rationale for Evaluation and Treatment: Rehabilitation  THERAPY DIAG:  Other low back pain  Muscle weakness (generalized)  ONSET DATE: 4 yr past year exacerbated  SUBJECTIVE:                                                                                                                                                                                           SUBJECTIVE STATEMENT: Saw MD I have to have surgery.  Date not yet set  From evaluation:  Have has 2 back surgerys.   MRI Wed.  May be facing another surgery for back. Nerve pain down both legs with left leg sometimes go numb 3/7 days. Also tendonitis R>L.  Have own backyard pool. Wakes almost hourly due to pain  PERTINENT HISTORY:  L TKR R Knee OA  PAIN:  Are you having pain? Yes: NPRS scale: current 4/10 Pain location:  lower back Pain description: constant ache Aggravating factors: sidelying increase leg pain Relieving factors: sitting, resting.  Unable to take some meds  PRECAUTIONS: Fall  RED FLAGS: None   WEIGHT BEARING RESTRICTIONS: No  FALLS:  Has patient fallen in last 6 months? No  LIVING ENVIRONMENT: Lives with: lives with their spouse Lives in: House/apartment Stairs:  3-4 step going into home no handrail Has following equipment at home: None  OCCUPATION: retired.  PLOF: Independent  PATIENT GOALS: walk, work in yard, relaxing at night, decrease pain  NEXT MD VISIT: as needed  OBJECTIVE:  Note: Objective measures were completed at Evaluation unless otherwise noted.  DIAGNOSTIC FINDINGS:  None recent  PATIENT SURVEYS:  Modified Oswestry 44%   COGNITION: Overall cognitive status: Within functional limits for tasks assessed     SENSATION: Numbness LLE   POSTURE:  slight forward head  PALPATION: No TTP  LUMBAR ROM:   AROM eval  Flexion FT to mid shin P!  Extension Full P!  Right lateral flexion Limited 25%P!  Left lateral flexion Limited 25%P!  Right rotation   Left rotation    (Blank rows = not tested)  LOWER EXTREMITY ROM:     wfl  LOWER EXTREMITY MMT:    MMT Right eval Left eval  Hip flexion 29.6 28.2  Hip extension    Hip abduction 24.4 22.5  Hip adduction  Hip internal rotation    Hip external rotation    Knee flexion    Knee extension 41.0 32.6  Ankle dorsiflexion    Ankle plantarflexion    Ankle inversion    Ankle eversion     (Blank rows = not tested)  LUMBAR SPECIAL TESTS:  Slump test: Positive  FUNCTIONAL TESTS:  5  times sit to stand: may be tested later as tolerated.  Not tolerated today Timed up and go (TUG): 14.52 4 stage balance: passed 1&2; Tandem 8s; SLS 5s  GAIT: Distance walked: 400 ft Assistive device utilized: None Level of assistance: Complete Independence Comments: slowed cadence  TREATMENT [facility] Adult PT Treatment:                                                DATE: 01/28/24 Pt seen for aquatic therapy today.  Treatment took place in water 3.5-4.75 ft in depth at the [facility] pool. Temp of water was 91.  Pt entered/exited the pool via stairs independently with bilat rail.  - unsupported walking forward/ backward  -instruction on abd bracing for core engagement - unsupported side stepping -> with arm addct/ abdct with yellow hand floats - farmer carry with bilat --> single yellow hand floats marching forward/ backward -L stretch - TrA set with short hollow noodle->full noodle pull down to thighs x 10 wide stance and staggered stances - UE on yellow hand floats :   hip add/abd 2 x 10 each LE ; hip flexion /extension x 10 each LE (improved execution) - straddling noodle without support: cycling with breast stroke arms, hip abdct/addct, cross country ski; return to cycling-with increased speed    Pt requires the buoyancy and hydrostatic pressure of water for support, and to offload joints by unweighting joint load by at least 50 % in navel deep water and by at least 75-80% in chest to neck deep water.  Viscosity of the water is needed for resistance of strengthening. Water current perturbations provides challenge to standing balance requiring increased core activation.   PATIENT EDUCATION:  Education details: Intro to aquatic therapy Person educated: Patient Education method: Explanation Education comprehension: verbalized understanding  HOME EXERCISE PROGRAM: TBA  ASSESSMENT:  CLINICAL IMPRESSION: Good tolerance to aquatic exercise without production of increase in  symptoms.  Progressed exercises with balance challenge with LE exercises, as well as increased resistance   Goals are ongoing.     Initial impression: Patient is a 73 y.o. F who was seen today for physical therapy evaluation and treatment for Low back pain. Pt has already had to LB surgery x 4 years ago and reports she may need another.  MRI and next MD appt scheduled in the next 1-2 weeks.  She is active senior and states she does not let the pain slow her down too much. Objective testing demonstrate weakness in LE and core with L>R. She also reports LLE numbness more frequent then right occurring 3/7 days a week.  Her pain does wake her almost hourly at night.  She will benefit from skilled Physical Therapy intervention. Will plan to begin in aquatics as she will benefit from the properties of water to progress towards goals with transition to land after decisions made regarding possible surgical intervention.  OBJECTIVE IMPAIRMENTS: decreased activity tolerance, difficulty walking, decreased strength, and pain.   ACTIVITY LIMITATIONS: carrying, lifting, bending,  sitting, standing, squatting, sleeping, transfers, and locomotion level  PARTICIPATION LIMITATIONS: community activity and yard work, playing with grand children  PERSONAL FACTORS: 1-2 comorbidities: see problem lost  are also affecting patient's functional outcome.   REHAB POTENTIAL: Good  CLINICAL DECISION MAKING: Evolving/moderate complexity  EVALUATION COMPLEXITY: Moderate   GOALS: Goals reviewed with patient? Yes  SHORT TERM GOALS: Target date: 02/08/24  Pt will tolerate full aquatic sessions consistently without increase in pain and with improving function to demonstrate good toleration and effectiveness of intervention.  Baseline: Goal status: INITIAL  2.  Pt will report reduction of pain and numbness with submersion by 2-3 NPRS Baseline:  Goal status: INITIAL  3.  Pt will recognize the benefit of aquatic  intervention using the properties of water for strengthening and pain management Baseline:  Goal status: INITIAL   LONG TERM GOALS: Target date: 03/03/24  Pt to improve on ODI to 31% or less (MCID) to demonstrate statistically significant Improvement in function. Baseline: 20/45=44% Goal status: INITIAL  2.  Pt will reports increased hours of uninterrupted sleep to 3-4 hours due to decreasing pain in legs Baseline: maybe 1 hour Goal status: INITIAL  3.  Pt will report reduction of overall back pain during day by at least 50% Baseline: see chart Goal status: INITIAL  4.  Pt will improve strength in all areas listed by at least 5 lb to demonstrate improved overall physical function Baseline:  Goal status: INITIAL     PLAN:  PT FREQUENCY: 1-2x/week  PT DURATION: 6 weeks  PLANNED INTERVENTIONS: 97164- PT Re-evaluation, 97110-Therapeutic exercises, 97530- Therapeutic activity, 97112- Neuromuscular re-education, 97535- Self Care, 86578- Manual therapy, [id number]- Gait training, [phone]- Orthotic Fit/training, [phone]- Aquatic Therapy, [phone]- Ionotophoresis 4mg /ml Dexamethasone, Patient/Family education, Balance training, Stair training, Taping, Dry Needling, Joint mobilization, DME instructions, Cryotherapy, and Moist heat.  PLAN FOR NEXT SESSION: Aquatics only:   Corrie Dandy [place]) Mavric Cortright MPT 01/28/24 12:11 PM [facility] [address] [place], [place], [address] Phone: [phone]   Fax:  [phone]     Referring diagnosis? M54.50 (ICD-10-CM) - Low back pain, unspecified  Treatment diagnosis? (if different than referring diagnosis) no What was this (referring dx) caused by? []  Surgery []  Fall [x]  Ongoing issue []  Arthritis []  Other: ____________  Laterality: []  Rt []  Lt [x]  Both  Check all possible CPT codes:  *CHOOSE 10 OR LESS*    See Planned Interventions listed in the Plan section of the Evaluation.

## 2024-01-31 ENCOUNTER — Ambulatory Visit (HOSPITAL_BASED_OUTPATIENT_CLINIC_OR_DEPARTMENT_OTHER): Admitting: Physical Therapy

## 2024-01-31 ENCOUNTER — Telehealth: Payer: Self-pay | Admitting: Internal Medicine

## 2024-01-31 ENCOUNTER — Encounter (HOSPITAL_BASED_OUTPATIENT_CLINIC_OR_DEPARTMENT_OTHER): Payer: Self-pay | Admitting: Physical Therapy

## 2024-01-31 DIAGNOSIS — M6281 Muscle weakness (generalized): Secondary | ICD-10-CM

## 2024-01-31 DIAGNOSIS — M5459 Other low back pain: Secondary | ICD-10-CM

## 2024-01-31 NOTE — Therapy (Signed)
 OUTPATIENT PHYSICAL THERAPY THORACOLUMBAR TREATMENT   Patient Name: Carol Cox MRN: [id number] DOB:1951-08-26, 73 y.o., female Today's Date: 01/31/2024  END OF SESSION:  PT End of Session - 01/31/24 1316     Visit Number 5    Number of Visits 12    Date for PT Re-Evaluation 03/03/24    Authorization Type [employer] MCR    Progress Note Due on Visit 10    PT Start Time 1316    PT Stop Time 1355    PT Time Calculation (min) 39 min    Activity Tolerance Patient tolerated treatment well    Behavior During Therapy WFL for tasks assessed/performed              Past Medical History:  Diagnosis Date   Allergy    seasonal   Arthritis    "knee, left hip, ankles"    Borderline glaucoma    CAD (coronary artery disease)    Mild nonobstructive CAD by cath 02/06/13 (25% mid LAD), normal EF   Chronic low back pain    Colitis    Coronary arteriosclerosis    DDD (degenerative disc disease), lumbar    Degeneration of intervertebral disc of lumbar region    Family history of adverse reaction to anesthesia    mother has problems with nausea and vomiting    Glaucoma    History of colitis    02/ 2016  acute infectious colitis-- resolved   History of total knee arthroplasty    Hyperglycemia    Hyperlipidemia    Left ovarian cyst    Low back pain    with bilateral hip radiation    Lumbar radiculopathy    Lumbar spondylolysis    Obesity    Osteoporosis    ospenia of hips   Pain in joint of left shoulder    Pain of right hip joint    PONV (postoperative nausea and vomiting)    SEVERE   Past Surgical History:  Procedure Laterality Date   ABDOMINAL EXPOSURE N/A 05/01/2020   Procedure: ABDOMINAL EXPOSURE;  Surgeon: Larina Earthly, MD;  Location: [facility] OR;  Service: Vascular;  Laterality: N/A;   ANTERIOR LAT LUMBAR FUSION N/A 05/01/2020   Procedure: ANTERIOR LATERAL LUMBAR FUSION L4-S1;  Surgeon: Venita Lick, MD;  Location: [facility] OR;  Service: Orthopedics;  Laterality: N/A;  4.5 hrs Dr.  Arbie Cookey to do approach tap block with exparel   CARPAL TUNNEL RELEASE Bilateral 1990's   KNEE ARTHROSCOPY W/ MENISCECTOMY Left 07/2014   LEFT HEART CATHETERIZATION WITH CORONARY ANGIOGRAM N/A 02/06/2013   Procedure: LEFT HEART CATHETERIZATION WITH CORONARY ANGIOGRAM;  Surgeon: Iran Ouch, MD;  Location: [facility] CATH LAB;  Service: Cardiovascular;  Laterality: N/A;  non-obstruction 25% mLAD,  normal LVF , ef 65-70%   left knee meniscus surgery      NASAL SINUS SURGERY     ovarian cyst     left ovary   ROTATOR CUFF REPAIR Right 10/2016   TOTAL KNEE ARTHROPLASTY Left 03/26/2015   Procedure: LEFT TOTAL KNEE ARTHROPLASTY;  Surgeon: Durene Romans, MD;  Location: [facility] ORS;  Service: Orthopedics;  Laterality: Left;   Patient Active Problem List   Diagnosis Date Noted   S/P lumbar fusion 05/01/2020   Hypertension 11/20/2019   Family history of early CAD 07/15/2017   Elevated blood pressure reading in office without diagnosis of hypertension 07/15/2017   Obese 03/27/2015   S/P left TKA 03/26/2015   S/P knee replacement 03/26/2015   Colitis, acute 12/01/2014  Weight loss, unintentional 12/01/2014   Adnexal cyst 12/01/2014   Elevated fasting glucose 12/01/2014   CAD (coronary artery disease) 12/01/2014   Osteoarthritis 12/01/2014   Vasovagal attack 02/28/2013   Hyperlipidemia 02/28/2013    PCP: Benedetto Goad MD  REFERRING PROVIDER: Venita Lick MD  REFERRING DIAG: M54.50 (ICD-10-CM) - Low back pain, unspecified   Rationale for Evaluation and Treatment: Rehabilitation  THERAPY DIAG:  Other low back pain  Muscle weakness (generalized)  ONSET DATE: 4 yr past year exacerbated  SUBJECTIVE:                                                                                                                                                                                           SUBJECTIVE STATEMENT: Pain is ok today. I don't think I have had much numbness.  Not in the water.  Seems to be mid  morning when I have it.  From evaluation:  Have has 2 back surgerys.  MRI Wed.  May be facing another surgery for back. Nerve pain down both legs with left leg sometimes go numb 3/7 days. Also tendonitis R>L.  Have own backyard pool. Wakes almost hourly due to pain  PERTINENT HISTORY:  L TKR R Knee OA  PAIN:  Are you having pain? Yes: NPRS scale: current 4/10 Pain location:  lower back Pain description: constant ache Aggravating factors: sidelying increase leg pain Relieving factors: sitting, resting.  Unable to take some meds  PRECAUTIONS: Fall  RED FLAGS: None   WEIGHT BEARING RESTRICTIONS: No  FALLS:  Has patient fallen in last 6 months? No  LIVING ENVIRONMENT: Lives with: lives with their spouse Lives in: House/apartment Stairs:  3-4 step going into home no handrail Has following equipment at home: None  OCCUPATION: retired.  PLOF: Independent  PATIENT GOALS: walk, work in yard, relaxing at night, decrease pain  NEXT MD VISIT: as needed  OBJECTIVE:  Note: Objective measures were completed at Evaluation unless otherwise noted.  DIAGNOSTIC FINDINGS:  None recent  PATIENT SURVEYS:  Modified Oswestry 44%   COGNITION: Overall cognitive status: Within functional limits for tasks assessed     SENSATION: Numbness LLE   POSTURE:  slight forward head  PALPATION: No TTP  LUMBAR ROM:   AROM eval  Flexion FT to mid shin P!  Extension Full P!  Right lateral flexion Limited 25%P!  Left lateral flexion Limited 25%P!  Right rotation   Left rotation    (Blank rows = not tested)  LOWER EXTREMITY ROM:     wfl  LOWER EXTREMITY MMT:    MMT Right eval Left eval  Hip flexion 29.6 28.2  Hip extension    Hip abduction 24.4 22.5  Hip adduction    Hip internal rotation    Hip external rotation    Knee flexion    Knee extension 41.0 32.6  Ankle dorsiflexion    Ankle plantarflexion    Ankle inversion    Ankle eversion     (Blank rows = not  tested)  LUMBAR SPECIAL TESTS:  Slump test: Positive  FUNCTIONAL TESTS:  5 times sit to stand: may be tested later as tolerated.  Not tolerated today Timed up and go (TUG): 14.52 4 stage balance: passed 1&2; Tandem 8s; SLS 5s  GAIT: Distance walked: 400 ft Assistive device utilized: None Level of assistance: Complete Independence Comments: slowed cadence  TREATMENT [facility] Adult PT Treatment:                                                DATE: 01/28/24 Pt seen for aquatic therapy today.  Treatment took place in water 3.5-4.75 ft in depth at the [facility] pool. Temp of water was 91.  Pt entered/exited the pool via stairs independently with bilat rail.  - unsupported walking forward/ backward  -instruction on abd bracing for core engagement - farmer carry with bilat --> single yellow hand floats marching forward/ backward - side stepping with arm addct/ abdct with yellow hand floats x 4 widths -sts from 3rd step x 10 unsupported.  VC for execution and immediate standing balance. -seated: hip add/abd; cycling -step ups onto bottom step leading R/L. Cues for unsupported and TKE control left knee -L stretch x 3 - TrA set with full noodle pull down to thighs x 10 wide stance and staggered stances 3.8 ft; oblique sets x 5  Pt requires the buoyancy and hydrostatic pressure of water for support, and to offload joints by unweighting joint load by at least 50 % in navel deep water and by at least 75-80% in chest to neck deep water.  Viscosity of the water is needed for resistance of strengthening. Water current perturbations provides challenge to standing balance requiring increased core activation.   PATIENT EDUCATION:  Education details: Intro to aquatic therapy Person educated: Patient Education method: Explanation Education comprehension: verbalized understanding  HOME EXERCISE PROGRAM: TBA  ASSESSMENT:  CLINICAL IMPRESSION: Pt reports overall reduction in pain and  numbness.  She is sleeping better. Increased focus on lle strengthening today with good toleration. Instructed to maintain core stability throughout all exercises.  She reports her pain level as low today but remains the same throughout session.  She has met STG 1 &2. She is able to verbalize the benefit she is gaining from use of properties of water and will be getting her own equipment to follow through with aquatic HEP that will be assigned.    Initial impression: Patient is a 73 y.o. F who was seen today for physical therapy evaluation and treatment for Low back pain. Pt has already had to LB surgery x 4 years ago and reports she may need another.  MRI and next MD appt scheduled in the next 1-2 weeks.  She is active senior and states she does not let the pain slow her down too much. Objective testing demonstrate weakness in LE and core with L>R. She also reports LLE numbness more frequent then right occurring 3/7 days a week.  Her pain does wake her  almost hourly at night.  She will benefit from skilled Physical Therapy intervention. Will plan to begin in aquatics as she will benefit from the properties of water to progress towards goals with transition to land after decisions made regarding possible surgical intervention.  OBJECTIVE IMPAIRMENTS: decreased activity tolerance, difficulty walking, decreased strength, and pain.   ACTIVITY LIMITATIONS: carrying, lifting, bending, sitting, standing, squatting, sleeping, transfers, and locomotion level  PARTICIPATION LIMITATIONS: community activity and yard work, playing with grand children  PERSONAL FACTORS: 1-2 comorbidities: see problem lost  are also affecting patient's functional outcome.   REHAB POTENTIAL: Good  CLINICAL DECISION MAKING: Evolving/moderate complexity  EVALUATION COMPLEXITY: Moderate   GOALS: Goals reviewed with patient? Yes  SHORT TERM GOALS: Target date: 02/08/24  Pt will tolerate full aquatic sessions consistently  without increase in pain and with improving function to demonstrate good toleration and effectiveness of intervention.  Baseline: Goal status: met 01/31/24  2.  Pt will report reduction of pain and numbness with submersion by 2-3 NPRS Baseline:  Goal status: INITIAL  3.  Pt will recognize the benefit of aquatic intervention using the properties of water for strengthening and pain management Baseline:  Goal status: met 01/31/24   LONG TERM GOALS: Target date: 03/03/24  Pt to improve on ODI to 31% or less (MCID) to demonstrate statistically significant Improvement in function. Baseline: 20/45=44% Goal status: INITIAL  2.  Pt will reports increased hours of uninterrupted sleep to 3-4 hours due to decreasing pain in legs Baseline: maybe 1 hour Goal status: INITIAL  3.  Pt will report reduction of overall back pain during day by at least 50% Baseline: see chart Goal status: INITIAL  4.  Pt will improve strength in all areas listed by at least 5 lb to demonstrate improved overall physical function Baseline:  Goal status: INITIAL     PLAN:  PT FREQUENCY: 1-2x/week  PT DURATION: 6 weeks  PLANNED INTERVENTIONS: 97164- PT Re-evaluation, 97110-Therapeutic exercises, 97530- Therapeutic activity, 97112- Neuromuscular re-education, 97535- Self Care, 60454- Manual therapy, [id number]- Gait training, [phone]- Orthotic Fit/training, [phone]- Aquatic Therapy, [phone]- Ionotophoresis 4mg /ml Dexamethasone, Patient/Family education, Balance training, Stair training, Taping, Dry Needling, Joint mobilization, DME instructions, Cryotherapy, and Moist heat.  PLAN FOR NEXT SESSION: Aquatics only:   Corrie Dandy [place]) Mcdonald Reiling MPT 01/31/24 1:56 PM [facility] [address] [place], [place], [address] Phone: [phone]   Fax:  [phone]     Referring diagnosis? M54.50 (ICD-10-CM) - Low back pain, unspecified  Treatment diagnosis? (if different than  referring diagnosis) no What was this (referring dx) caused by? []  Surgery []  Fall [x]  Ongoing issue []  Arthritis []  Other: ____________  Laterality: []  Rt []  Lt [x]  Both  Check all possible CPT codes:  *CHOOSE 10 OR LESS*    See Planned Interventions listed in the Plan section of the Evaluation.

## 2024-01-31 NOTE — Telephone Encounter (Signed)
 Paper Work Dropped Off: Pre-OP clearance   Date:01/31/2024  Location of paper:  Faxed over to pre-op number .  Copy in folder in front admin area .

## 2024-02-01 ENCOUNTER — Telehealth: Payer: Self-pay

## 2024-02-01 NOTE — Telephone Encounter (Signed)
   Name: Carol Cox  DOB: 1951/09/05  MRN: [id number]  Primary Cardiologist: Lance Muss, MD   Preoperative team, please contact this patient and set up a phone call appointment for further preoperative risk assessment. Please obtain consent and complete medication review. Thank you for your help.  I confirm that guidance regarding antiplatelet and oral anticoagulation therapy has been completed and, if necessary, noted below.  Per office protocol, if patient is without any new symptoms or concerns at the time of their virtual visit, she may hold Aspirin for 5-7 days prior to procedure. Please resume Aspirin as soon as possible postprocedure, at the discretion of the surgeon.    I also confirmed the patient resides in the state of [place]. As per [facility] Medical Board telemedicine laws, the patient must reside in the state in which the provider is licensed.   Denyce Robert, NP 02/01/2024, 1:56 PM [facility]

## 2024-02-01 NOTE — Telephone Encounter (Signed)
 Patient has been scheduled for clearance med rec and consent done     Patient Consent for Virtual Visit         Carol Cox has provided verbal consent on 02/01/2024 for a virtual visit (video or telephone).   CONSENT FOR VIRTUAL VISIT FOR:  Carol Cox  By participating in this virtual visit I agree to the following:  I hereby voluntarily request, consent and authorize [facility] and its employed or contracted physicians, physician assistants, nurse practitioners or other licensed health care professionals (the Practitioner), to provide me with telemedicine health care services (the "Services") as deemed necessary by the treating Practitioner. I acknowledge and consent to receive the Services by the Practitioner via telemedicine. I understand that the telemedicine visit will involve communicating with the Practitioner through live audiovisual communication technology and the disclosure of certain medical information by electronic transmission. I acknowledge that I have been given the opportunity to request an in-person assessment or other available alternative prior to the telemedicine visit and am voluntarily participating in the telemedicine visit.  I understand that I have the right to withhold or withdraw my consent to the use of telemedicine in the course of my care at any time, without affecting my right to future care or treatment, and that the Practitioner or I may terminate the telemedicine visit at any time. I understand that I have the right to inspect all information obtained and/or recorded in the course of the telemedicine visit and may receive copies of available information for a reasonable fee.  I understand that some of the potential risks of receiving the Services via telemedicine include:  Delay or interruption in medical evaluation due to technological equipment failure or disruption; Information transmitted may not be sufficient (e.g. poor resolution of  images) to allow for appropriate medical decision making by the Practitioner; and/or  In rare instances, security protocols could fail, causing a breach of personal health information.  Furthermore, I acknowledge that it is my responsibility to provide information about my medical history, conditions and care that is complete and accurate to the best of my ability. I acknowledge that Practitioner's advice, recommendations, and/or decision may be based on factors not within their control, such as incomplete or inaccurate data provided by me or distortions of diagnostic images or specimens that may result from electronic transmissions. I understand that the practice of medicine is not an exact science and that Practitioner makes no warranties or guarantees regarding treatment outcomes. I acknowledge that a copy of this consent can be made available to me via my patient portal [facility] MyChart), or I can request a printed copy by calling the office of [facility].    I understand that my insurance will be billed for this visit.   I have read or had this consent read to me. I understand the contents of this consent, which adequately explains the benefits and risks of the Services being provided via telemedicine.  I have been provided ample opportunity to ask questions regarding this consent and the Services and have had my questions answered to my satisfaction. I give my informed consent for the services to be provided through the use of telemedicine in my medical care

## 2024-02-01 NOTE — Telephone Encounter (Signed)
   Pre-operative Risk Assessment    Patient Name: Carol Cox  DOB: 11-04-1950 MRN: [id number]   Date of last office visit: 06/25/23 WITH TESSA CONTE, PA-C Date of next office visit: 04/20/24 WITH DR. [facility]    Request for Surgical Clearance    Procedure:   XLIF L2-3, L3-4  Date of Surgery:  Clearance TBD                                Surgeon:  NONE INDICATED  Surgeon's Group or Practice Name:  [facility] Phone number:  [phone] Fax number:  [phone]   Type of Clearance Requested:   - Medical  - Pharmacy:  Hold Aspirin NEEDS INSTRUCTIONS WHEN TO HOLD   Type of Anesthesia:  Not Indicated   Additional requests/questions:    Signed, Michaelle Copas   02/01/2024, 1:35 PM

## 2024-02-01 NOTE — Telephone Encounter (Signed)
 Patient has been scheduled for telephone appt med rec and consent done

## 2024-02-02 ENCOUNTER — Ambulatory Visit (HOSPITAL_COMMUNITY): Payer: Self-pay | Admitting: Physician Assistant

## 2024-02-04 ENCOUNTER — Ambulatory Visit: Attending: Cardiology

## 2024-02-04 DIAGNOSIS — Z01810 Encounter for preprocedural cardiovascular examination: Secondary | ICD-10-CM | POA: Diagnosis not present

## 2024-02-04 NOTE — Progress Notes (Signed)
 Virtual Visit via Telephone Note   Because of Javaeh A Langhans co-morbid illnesses, she is at least at moderate risk for complications without adequate follow up.  This format is felt to be most appropriate for this patient at this time.  Due to technical limitations with video connection (technology), today's appointment will be conducted as an audio only telehealth visit, and Carol Cox verbally agreed to proceed in this manner.   All issues noted in this document were discussed and addressed.  No physical exam could be performed with this format.  Evaluation Performed:  Preoperative cardiovascular risk assessment _____________   Date:  02/04/2024   Patient ID:  Carol Cox, Carol Cox 1951/03/11, MRN [id number] Patient Location:  Home Provider location:   Office  Primary Care Provider:  Barbie Banner, MD Primary Cardiologist:  Lance Muss, MD  Chief Complaint / Patient Profile   73 y.o. y/o female with a h/o CAD, HLD, HTN, DDD, obesity who is pending anterior lumbar fusion and presents today for telephonic preoperative cardiovascular risk assessment.  History of Present Illness    Carol Cox is a 73 y.o. female who presents via audio/video conferencing for a telehealth visit today.  Pt was last seen in cardiology clinic on 06/25/2023 by Jari Favre, PA.  At that time Carol Cox was doing well with no shortness of breath or chest pain.  The patient is now pending procedure as outlined above. Since her last visit, she has been doing well with no new cardiac complaints.  She is staying active but is limited with back pain.  She is currently doing water aerobics for physical activity and tolerating this well without any cardiac limitations.  She denies chest pain, shortness of breath, lower extremity edema, fatigue, palpitations, melena, hematuria, hemoptysis, diaphoresis, weakness, presyncope, syncope, orthopnea, and PND.   Past Medical History    Past Medical  History:  Diagnosis Date   Allergy    seasonal   Arthritis    "knee, left hip, ankles"    Borderline glaucoma    CAD (coronary artery disease)    Mild nonobstructive CAD by cath 02/06/13 (25% mid LAD), normal EF   Chronic low back pain    Colitis    Coronary arteriosclerosis    DDD (degenerative disc disease), lumbar    Degeneration of intervertebral disc of lumbar region    Family history of adverse reaction to anesthesia    mother has problems with nausea and vomiting    Glaucoma    History of colitis    02/ 2016  acute infectious colitis-- resolved   History of total knee arthroplasty    Hyperglycemia    Hyperlipidemia    Left ovarian cyst    Low back pain    with bilateral hip radiation    Lumbar radiculopathy    Lumbar spondylolysis    Obesity    Osteoporosis    ospenia of hips   Pain in joint of left shoulder    Pain of right hip joint    PONV (postoperative nausea and vomiting)    SEVERE   Past Surgical History:  Procedure Laterality Date   ABDOMINAL EXPOSURE N/A 05/01/2020   Procedure: ABDOMINAL EXPOSURE;  Surgeon: Larina Earthly, MD;  Location: [facility] OR;  Service: Vascular;  Laterality: N/A;   ANTERIOR LAT LUMBAR FUSION N/A 05/01/2020   Procedure: ANTERIOR LATERAL LUMBAR FUSION L4-S1;  Surgeon: Venita Lick, MD;  Location: [facility] OR;  Service: Orthopedics;  Laterality: N/A;  4.5 hrs Dr. Arbie Cookey to do approach tap block with exparel   CARPAL TUNNEL RELEASE Bilateral 1990's   KNEE ARTHROSCOPY W/ MENISCECTOMY Left 07/2014   LEFT HEART CATHETERIZATION WITH CORONARY ANGIOGRAM N/A 02/06/2013   Procedure: LEFT HEART CATHETERIZATION WITH CORONARY ANGIOGRAM;  Surgeon: Iran Ouch, MD;  Location: [facility] CATH LAB;  Service: Cardiovascular;  Laterality: N/A;  non-obstruction 25% mLAD,  normal LVF , ef 65-70%   left knee meniscus surgery      NASAL SINUS SURGERY     ovarian cyst     left ovary   ROTATOR CUFF REPAIR Right 10/2016   TOTAL KNEE ARTHROPLASTY Left 03/26/2015    Procedure: LEFT TOTAL KNEE ARTHROPLASTY;  Surgeon: Durene Romans, MD;  Location: [facility] ORS;  Service: Orthopedics;  Laterality: Left;    Allergies  Allergies  Allergen Reactions   Atorvastatin Other (See Comments)    Leg pain   Rosuvastatin Other (See Comments)    Leg pain    Home Medications    Prior to Admission medications   Medication Sig Start Date End Date Taking? Authorizing Provider  aspirin EC 81 MG tablet Take 81 mg by mouth daily.    [provider]  Calcium 200 MG TABS     [provider]  Cholecalciferol (VITAMIN D3) 50 MCG (2000 UT) capsule     [provider]  dorzolamide-timolol (COSOPT) 22.3-6.8 MG/ML ophthalmic solution Place 1 drop into both eyes in the morning and at bedtime. 04/09/20   [provider]  doxycycline (VIBRA-TABS) 100 MG tablet Take 100 mg by mouth daily. Patient not taking: Reported on 07/08/2023    [provider]  Evolocumab (REPATHA SURECLICK) 140 MG/ML SOAJ INJECT 1 PEN INTO THE SKIN EVERY 14 (FOURTEEN) DAYS. 05/10/23   Corky Crafts, MD  ezetimibe (ZETIA) 10 MG tablet TAKE 1 TABLET BY MOUTH EVERY DAY 01/17/24   Corky Crafts, MD  lisinopril (ZESTRIL) 20 MG tablet TAKE 1 TABLET BY MOUTH EVERY DAY 01/17/24   Corky Crafts, MD  meloxicam (MOBIC) 7.5 MG tablet Take 1 tablet (7.5 mg total) by mouth daily. Patient not taking: Reported on 07/08/2023 11/24/22   Margarita Grizzle, MD  Omega-3 Fatty Acids (FISH OIL) 1000 MG CAPS Take 2 capsules by mouth daily.    [provider]  Pediatric Multivitamins-Fl (MULTIVITAMIN + FLUORIDE) 0.25 MG CHEW     [provider]  psyllium (METAMUCIL) 58.6 % powder Take 1 packet by mouth as needed (constipation).    [provider]  Travoprost, BAK Free, (TRAVATAN) 0.004 % SOLN ophthalmic solution Place 1 drop into both eyes at bedtime.     [provider]    Physical Exam    Vital Signs:  Carol Cox does not have vital signs  available for review today.135/70  Given telephonic nature of communication, physical exam is limited. AAOx3. NAD. Normal affect.  Speech and respirations are unlabored.  Accessory Clinical Findings    None  Assessment & Plan    1.  Preoperative Cardiovascular Risk Assessment: -Patient's RCRI score 0.9%  The patient affirms she has been doing well without any new cardiac symptoms. They are able to achieve 7 METS without cardiac limitations. Therefore, based on ACC/AHA guidelines, the patient would be at acceptable risk for the planned procedure without further cardiovascular testing. The patient was advised that if she develops new symptoms prior to surgery to contact our office to arrange for a follow-up visit, and she verbalized understanding.   The  patient was advised that if she develops new symptoms prior to surgery to contact our office to arrange for a follow-up visit, and she verbalized understanding.   Patient may hold Aspirin for 5-7 days prior to procedure.   A copy of this note will be routed to requesting surgeon.  Time:   Today, I have spent 7 minutes with the patient with telehealth technology discussing medical history, symptoms, and management plan.     Napoleon Form, Leodis Rains, NP  02/04/2024, 7:16 AM

## 2024-02-07 ENCOUNTER — Encounter (HOSPITAL_BASED_OUTPATIENT_CLINIC_OR_DEPARTMENT_OTHER): Payer: Self-pay | Admitting: Physical Therapy

## 2024-02-07 ENCOUNTER — Ambulatory Visit (HOSPITAL_BASED_OUTPATIENT_CLINIC_OR_DEPARTMENT_OTHER): Admitting: Physical Therapy

## 2024-02-07 DIAGNOSIS — M5459 Other low back pain: Secondary | ICD-10-CM

## 2024-02-07 DIAGNOSIS — M6281 Muscle weakness (generalized): Secondary | ICD-10-CM

## 2024-02-07 NOTE — Therapy (Signed)
 OUTPATIENT PHYSICAL THERAPY THORACOLUMBAR TREATMENT   Patient Name: Carol Cox MRN: [id number] DOB:27-Aug-1951, 73 y.o., female Today's Date: 02/07/2024  END OF SESSION:  PT End of Session - 02/07/24 0931     Visit Number 6    Number of Visits 12    Date for PT Re-Evaluation 03/03/24    Authorization Type [employer] MCR    Progress Note Due on Visit 10    PT Start Time 0930    PT Stop Time 1010    PT Time Calculation (min) 40 min    Activity Tolerance Patient tolerated treatment well    Behavior During Therapy WFL for tasks assessed/performed              Past Medical History:  Diagnosis Date   Allergy    seasonal   Arthritis    "knee, left hip, ankles"    Borderline glaucoma    CAD (coronary artery disease)    Mild nonobstructive CAD by cath 02/06/13 (25% mid LAD), normal EF   Chronic low back pain    Colitis    Coronary arteriosclerosis    DDD (degenerative disc disease), lumbar    Degeneration of intervertebral disc of lumbar region    Family history of adverse reaction to anesthesia    mother has problems with nausea and vomiting    Glaucoma    History of colitis    02/ 2016  acute infectious colitis-- resolved   History of total knee arthroplasty    Hyperglycemia    Hyperlipidemia    Left ovarian cyst    Low back pain    with bilateral hip radiation    Lumbar radiculopathy    Lumbar spondylolysis    Obesity    Osteoporosis    ospenia of hips   Pain in joint of left shoulder    Pain of right hip joint    PONV (postoperative nausea and vomiting)    SEVERE   Past Surgical History:  Procedure Laterality Date   ABDOMINAL EXPOSURE N/A 05/01/2020   Procedure: ABDOMINAL EXPOSURE;  Surgeon: Mayo Speck, MD;  Location: [facility] OR;  Service: Vascular;  Laterality: N/A;   ANTERIOR LAT LUMBAR FUSION N/A 05/01/2020   Procedure: ANTERIOR LATERAL LUMBAR FUSION L4-S1;  Surgeon: Mort Ards, MD;  Location: [facility] OR;  Service: Orthopedics;  Laterality: N/A;  4.5 hrs Dr.  Shirley Douglas to do approach tap block with exparel   CARPAL TUNNEL RELEASE Bilateral 1990's   KNEE ARTHROSCOPY W/ MENISCECTOMY Left 07/2014   LEFT HEART CATHETERIZATION WITH CORONARY ANGIOGRAM N/A 02/06/2013   Procedure: LEFT HEART CATHETERIZATION WITH CORONARY ANGIOGRAM;  Surgeon: Wenona Hamilton, MD;  Location: [facility] CATH LAB;  Service: Cardiovascular;  Laterality: N/A;  non-obstruction 25% mLAD,  normal LVF , ef 65-70%   left knee meniscus surgery      NASAL SINUS SURGERY     ovarian cyst     left ovary   ROTATOR CUFF REPAIR Right 10/2016   TOTAL KNEE ARTHROPLASTY Left 03/26/2015   Procedure: LEFT TOTAL KNEE ARTHROPLASTY;  Surgeon: Claiborne Crew, MD;  Location: [facility] ORS;  Service: Orthopedics;  Laterality: Left;   Patient Active Problem List   Diagnosis Date Noted   S/P lumbar fusion 05/01/2020   Hypertension 11/20/2019   Family history of early CAD 07/15/2017   Elevated blood pressure reading in office without diagnosis of hypertension 07/15/2017   Obese 03/27/2015   S/P left TKA 03/26/2015   S/P knee replacement 03/26/2015   Colitis, acute 12/01/2014  Weight loss, unintentional 12/01/2014   Adnexal cyst 12/01/2014   Elevated fasting glucose 12/01/2014   CAD (coronary artery disease) 12/01/2014   Osteoarthritis 12/01/2014   Vasovagal attack 02/28/2013   Hyperlipidemia 02/28/2013    PCP: Edger Goody MD  REFERRING PROVIDER: Mort Ards MD  REFERRING DIAG: M54.50 (ICD-10-CM) - Low back pain, unspecified   Rationale for Evaluation and Treatment: Rehabilitation  THERAPY DIAG:  Other low back pain  Muscle weakness (generalized)  ONSET DATE: 4 yr past year exacerbated  SUBJECTIVE:                                                                                                                                                                                           SUBJECTIVE STATEMENT: LLE numb all night.  First time. Just tingling a little bit now in my calf Surgery scheduled  June 9-10  From evaluation:  Have has 2 back surgerys.  MRI Wed.  May be facing another surgery for back. Nerve pain down both legs with left leg sometimes go numb 3/7 days. Also tendonitis R>L.  Have own backyard pool. Wakes almost hourly due to pain  PERTINENT HISTORY:  L TKR R Knee OA  PAIN:  Are you having pain? Yes: NPRS scale: current 3/10 Pain location:  lower back Pain description: constant ache Aggravating factors: sidelying increase leg pain Relieving factors: sitting, resting.  Unable to take some meds  PRECAUTIONS: Fall  RED FLAGS: None   WEIGHT BEARING RESTRICTIONS: No  FALLS:  Has patient fallen in last 6 months? No  LIVING ENVIRONMENT: Lives with: lives with their spouse Lives in: House/apartment Stairs:  3-4 step going into home no handrail Has following equipment at home: None  OCCUPATION: retired.  PLOF: Independent  PATIENT GOALS: walk, work in yard, relaxing at night, decrease pain  NEXT MD VISIT: as needed  OBJECTIVE:  Note: Objective measures were completed at Evaluation unless otherwise noted.  DIAGNOSTIC FINDINGS:  None recent  PATIENT SURVEYS:  Modified Oswestry 44%   COGNITION: Overall cognitive status: Within functional limits for tasks assessed     SENSATION: Numbness LLE   POSTURE:  slight forward head  PALPATION: No TTP  LUMBAR ROM:   AROM eval  Flexion FT to mid shin P!  Extension Full P!  Right lateral flexion Limited 25%P!  Left lateral flexion Limited 25%P!  Right rotation   Left rotation    (Blank rows = not tested)  LOWER EXTREMITY ROM:     wfl  LOWER EXTREMITY MMT:    MMT Right eval Left eval  Hip flexion 29.6 28.2  Hip extension    Hip abduction  24.4 22.5  Hip adduction    Hip internal rotation    Hip external rotation    Knee flexion    Knee extension 41.0 32.6  Ankle dorsiflexion    Ankle plantarflexion    Ankle inversion    Ankle eversion     (Blank rows = not tested)  LUMBAR  SPECIAL TESTS:  Slump test: Positive  FUNCTIONAL TESTS:  5 times sit to stand: may be tested later as tolerated.  Not tolerated today Timed up and go (TUG): 14.52 4 stage balance: passed 1&2; Tandem 8s; SLS 5s  GAIT: Distance walked: 400 ft Assistive device utilized: None Level of assistance: Complete Independence Comments: slowed cadence  TREATMENT [facility] Adult PT Treatment:                                                DATE: 02/07/24 Pt seen for aquatic therapy today.  Treatment took place in water 3.5-4.75 ft in depth at the [facility] pool. Temp of water was 91.  Pt entered/exited the pool via stairs independently with bilat rail.  - unsupported walking forward/ backward/side -decompression on noodle wrapped anteriorly across chest: cycling; add/abd; hip flex/ext -L stretch x 3 - farmer carry with bilat --> single yellow hand floats marching forward/ backward - TrA set with full noodle pull down to thighs x 10 wide stance and staggered stances 3.8 ft; oblique sets x 10 -resisted arm swing using green hand bells 3 sets 5 slow/5 fast wide stance then staggered stances -walking between exercises for recovery.  Pt requires the buoyancy and hydrostatic pressure of water for support, and to offload joints by unweighting joint load by at least 50 % in navel deep water and by at least 75-80% in chest to neck deep water.  Viscosity of the water is needed for resistance of strengthening. Water current perturbations provides challenge to standing balance requiring increased core activation.   PATIENT EDUCATION:  Education details: Intro to aquatic therapy Person educated: Patient Education method: Explanation Education comprehension: verbalized understanding  HOME EXERCISE PROGRAM: TBA  ASSESSMENT:  CLINICAL IMPRESSION: Pt has opted for surgery which is scheduled early June.  She is instructed to try to gain access to pool so to be able to exercise right up to surgery in  setting. Today she presents with increased lle numbness. Reports sleep interruption which has improved up to this point Of note she has not been in pool for 1 full week which has had a pain and numbness reducing effect on. Spent extra time today in decompression position with cycling to allow for pain relief which is successful then progressed core strengthening adding hand bells for resisted isometric core engagement. Pain reduction to 0/10      Initial impression: Patient is a 73 y.o. F who was seen today for physical therapy evaluation and treatment for Low back pain. Pt has already had to LB surgery x 4 years ago and reports she may need another.  MRI and next MD appt scheduled in the next 1-2 weeks.  She is active senior and states she does not let the pain slow her down too much. Objective testing demonstrate weakness in LE and core with L>R. She also reports LLE numbness more frequent then right occurring 3/7 days a week.  Her pain does wake her almost hourly at night.  She will benefit  from skilled Physical Therapy intervention. Will plan to begin in aquatics as she will benefit from the properties of water to progress towards goals with transition to land after decisions made regarding possible surgical intervention.  OBJECTIVE IMPAIRMENTS: decreased activity tolerance, difficulty walking, decreased strength, and pain.   ACTIVITY LIMITATIONS: carrying, lifting, bending, sitting, standing, squatting, sleeping, transfers, and locomotion level  PARTICIPATION LIMITATIONS: community activity and yard work, playing with grand children  PERSONAL FACTORS: 1-2 comorbidities: see problem lost  are also affecting patient's functional outcome.   REHAB POTENTIAL: Good  CLINICAL DECISION MAKING: Evolving/moderate complexity  EVALUATION COMPLEXITY: Moderate   GOALS: Goals reviewed with patient? Yes  SHORT TERM GOALS: Target date: 02/08/24  Pt will tolerate full aquatic sessions consistently  without increase in pain and with improving function to demonstrate good toleration and effectiveness of intervention.  Baseline: Goal status: met 01/31/24  2.  Pt will report reduction of pain and numbness with submersion by 2-3 NPRS Baseline:  Goal status: In progress 02/07/24 (inconsistent)  3.  Pt will recognize the benefit of aquatic intervention using the properties of water for strengthening and pain management Baseline:  Goal status: met 01/31/24   LONG TERM GOALS: Target date: 03/03/24  Pt to improve on ODI to 31% or less (MCID) to demonstrate statistically significant Improvement in function. Baseline: 20/45=44% Goal status: INITIAL  2.  Pt will reports increased hours of uninterrupted sleep to 3-4 hours due to decreasing pain in legs Baseline: maybe 1 hour Goal status: INITIAL  3.  Pt will report reduction of overall back pain during day by at least 50% Baseline: see chart Goal status: INITIAL  4.  Pt will improve strength in all areas listed by at least 5 lb to demonstrate improved overall physical function Baseline:  Goal status: INITIAL     PLAN:  PT FREQUENCY: 1-2x/week  PT DURATION: 6 weeks  PLANNED INTERVENTIONS: 97164- PT Re-evaluation, 97110-Therapeutic exercises, 97530- Therapeutic activity, 97112- Neuromuscular re-education, 97535- Self Care, 91478- Manual therapy, [phone]- Gait training, [phone]- Orthotic Fit/training, [phone]- Aquatic Therapy, [phone]- Ionotophoresis 4mg /ml Dexamethasone, Patient/Family education, Balance training, Stair training, Taping, Dry Needling, Joint mobilization, DME instructions, Cryotherapy, and Moist heat.  PLAN FOR NEXT SESSION: Aquatics only:   Adriana Hopping [place]) Woodford Strege MPT 02/07/24 9:32 AM [facility] [address] [place], [place], [address] Phone: [phone]   Fax:  [phone]     Referring diagnosis? M54.50 (ICD-10-CM) - Low back pain, unspecified  Treatment diagnosis?  (if different than referring diagnosis) no What was this (referring dx) caused by? []  Surgery []  Fall [x]  Ongoing issue []  Arthritis []  Other: ____________  Laterality: []  Rt []  Lt [x]  Both  Check all possible CPT codes:  *CHOOSE 10 OR LESS*    See Planned Interventions listed in the Plan section of the Evaluation.

## 2024-02-10 ENCOUNTER — Encounter (HOSPITAL_BASED_OUTPATIENT_CLINIC_OR_DEPARTMENT_OTHER): Payer: Self-pay | Admitting: Physical Therapy

## 2024-02-10 ENCOUNTER — Ambulatory Visit (HOSPITAL_BASED_OUTPATIENT_CLINIC_OR_DEPARTMENT_OTHER): Admitting: Physical Therapy

## 2024-02-10 DIAGNOSIS — M5459 Other low back pain: Secondary | ICD-10-CM

## 2024-02-10 DIAGNOSIS — M6281 Muscle weakness (generalized): Secondary | ICD-10-CM

## 2024-02-10 NOTE — Therapy (Signed)
 OUTPATIENT PHYSICAL THERAPY THORACOLUMBAR TREATMENT   Patient Name: Carol Cox MRN: [id number] DOB:02-Mar-1951, 73 y.o., female Today's Date: 02/10/2024  END OF SESSION:  PT End of Session - 02/10/24 0927     Visit Number 7    Number of Visits 12    Date for PT Re-Evaluation 03/03/24    Authorization Type [employer] MCR    Progress Note Due on Visit 10    PT Start Time 0927    PT Stop Time 1008    PT Time Calculation (min) 41 min    Activity Tolerance Patient tolerated treatment well    Behavior During Therapy [facility] for tasks assessed/performed              Past Medical History:  Diagnosis Date   Allergy    seasonal   Arthritis    "knee, left hip, ankles"    Borderline glaucoma    CAD (coronary artery disease)    Mild nonobstructive CAD by cath 02/06/13 (25% mid LAD), normal EF   Chronic low back pain    Colitis    Coronary arteriosclerosis    DDD (degenerative disc disease), lumbar    Degeneration of intervertebral disc of lumbar region    Family history of adverse reaction to anesthesia    mother has problems with nausea and vomiting    Glaucoma    History of colitis    02/ 2016  acute infectious colitis-- resolved   History of total knee arthroplasty    Hyperglycemia    Hyperlipidemia    Left ovarian cyst    Low back pain    with bilateral hip radiation    Lumbar radiculopathy    Lumbar spondylolysis    Obesity    Osteoporosis    ospenia of hips   Pain in joint of left shoulder    Pain of right hip joint    PONV (postoperative nausea and vomiting)    SEVERE   Past Surgical History:  Procedure Laterality Date   ABDOMINAL EXPOSURE N/A 05/01/2020   Procedure: ABDOMINAL EXPOSURE;  Surgeon: Larina Earthly, MD;  Location: [facility] OR;  Service: Vascular;  Laterality: N/A;   ANTERIOR LAT LUMBAR FUSION N/A 05/01/2020   Procedure: ANTERIOR LATERAL LUMBAR FUSION L4-S1;  Surgeon: Venita Lick, MD;  Location: [facility] OR;  Service: Orthopedics;  Laterality: N/A;  4.5 hrs Dr.  Arbie Cookey to do approach tap block with exparel   CARPAL TUNNEL RELEASE Bilateral 1990's   KNEE ARTHROSCOPY W/ MENISCECTOMY Left 07/2014   LEFT HEART CATHETERIZATION WITH CORONARY ANGIOGRAM N/A 02/06/2013   Procedure: LEFT HEART CATHETERIZATION WITH CORONARY ANGIOGRAM;  Surgeon: Iran Ouch, MD;  Location: [facility] CATH LAB;  Service: Cardiovascular;  Laterality: N/A;  non-obstruction 25% mLAD,  normal LVF , ef 65-70%   left knee meniscus surgery      NASAL SINUS SURGERY     ovarian cyst     left ovary   ROTATOR CUFF REPAIR Right 10/2016   TOTAL KNEE ARTHROPLASTY Left 03/26/2015   Procedure: LEFT TOTAL KNEE ARTHROPLASTY;  Surgeon: Durene Romans, MD;  Location: [facility] ORS;  Service: Orthopedics;  Laterality: Left;   Patient Active Problem List   Diagnosis Date Noted   S/P lumbar fusion 05/01/2020   Hypertension 11/20/2019   Family history of early CAD 07/15/2017   Elevated blood pressure reading in office without diagnosis of hypertension 07/15/2017   Obese 03/27/2015   S/P left TKA 03/26/2015   S/P knee replacement 03/26/2015   Colitis, acute 12/01/2014  Weight loss, unintentional 12/01/2014   Adnexal cyst 12/01/2014   Elevated fasting glucose 12/01/2014   CAD (coronary artery disease) 12/01/2014   Osteoarthritis 12/01/2014   Vasovagal attack 02/28/2013   Hyperlipidemia 02/28/2013    PCP: Benedetto Goad MD  REFERRING PROVIDER: Venita Lick MD  REFERRING DIAG: M54.50 (ICD-10-CM) - Low back pain, unspecified   Rationale for Evaluation and Treatment: Rehabilitation  THERAPY DIAG:  Other low back pain  Muscle weakness (generalized)  ONSET DATE: 4 yr past year exacerbated  SUBJECTIVE:                                                                                                                                                                                           SUBJECTIVE STATEMENT: "I slept pretty well, I feel pretty good today".  No pain down LLE.  Surgery scheduled June  9-10. Pt reports her backyard pool is not open yet (pollen), but her husband is working on way to [employer].   "I feel like I'm getting stronger"   From evaluation:  Have has 2 back surgerys.  MRI Wed.  May be facing another surgery for back. Nerve pain down both legs with left leg sometimes go numb 3/7 days. Also tendonitis R>L.  Have own backyard pool. Wakes almost hourly due to pain  PERTINENT HISTORY:  L TKR R Knee OA  PAIN:  Are you having pain? Yes: NPRS scale: current 2/10 Pain location:  lower back Pain description: constant ache Aggravating factors: sidelying increase leg pain Relieving factors: sitting, resting.  Unable to take some meds  PRECAUTIONS: Fall  RED FLAGS: None   WEIGHT BEARING RESTRICTIONS: No  FALLS:  Has patient fallen in last 6 months? No  LIVING ENVIRONMENT: Lives with: lives with their spouse Lives in: House/apartment Stairs:  3-4 step going into home no handrail Has following equipment at home: None  OCCUPATION: retired.  PLOF: Independent  PATIENT GOALS: walk, work in yard, relaxing at night, decrease pain  NEXT MD VISIT: as needed  OBJECTIVE:  Note: Objective measures were completed at Evaluation unless otherwise noted.  DIAGNOSTIC FINDINGS:  None recent  PATIENT SURVEYS:  Modified Oswestry 44%   COGNITION: Overall cognitive status: Within functional limits for tasks assessed     SENSATION: Numbness LLE   POSTURE:  slight forward head  PALPATION: No TTP  LUMBAR ROM:   AROM eval  Flexion FT to mid shin P!  Extension Full P!  Right lateral flexion Limited 25%P!  Left lateral flexion Limited 25%P!  Right rotation   Left rotation    (Blank rows = not tested)  LOWER EXTREMITY ROM:  wfl  LOWER EXTREMITY MMT:    MMT Right eval Left eval  Hip flexion 29.6 28.2  Hip extension    Hip abduction 24.4 22.5  Hip adduction    Hip internal rotation    Hip external rotation    Knee flexion    Knee extension 41.0  32.6  Ankle dorsiflexion    Ankle plantarflexion    Ankle inversion    Ankle eversion     (Blank rows = not tested)  LUMBAR SPECIAL TESTS:  Slump test: Positive  FUNCTIONAL TESTS:  5 times sit to stand: may be tested later as tolerated.  Not tolerated today Timed up and go (TUG): 14.52 4 stage balance: passed 1&2; Tandem 8s; SLS 5s  GAIT: Distance walked: 400 ft Assistive device utilized: None Level of assistance: Complete Independence Comments: slowed cadence  TREATMENT [facility] Adult PT Treatment:                                                DATE: 02/10/24 Pt seen for aquatic therapy today.  Treatment took place in water 3.5-4.75 ft in depth at the [facility] pool. Temp of water was 91.  Pt entered/exited the pool via stairs independently with bilat rail.  - unsupported walking forward/ backward x 2 laps each - side stepping with arm addct/ abdct -> with rainbow hand floats x 3 laps - farmer carry with bilat rainbow--> single yellow hand floats marching forward/ backward - UE on yellow hand floats:  hip abdct/ addct x 10; leg swings into hip flex/ext  x10; heel raises  x 10 - TrA set with full noodle pull down to thighs x 5 wide stance and x 10 staggered stances in 4 ft; oblique sets (pressing into water, 3-5 sec- @ 11 and 1 o'clock x5) -walking between exercises for recovery - trial of enki boards tricep push down and row motion (easy) -> kick board row with increased speed  -resisted reciprocal arm swing using green hand bells x 15, wide stance then horiz abdct/ addct x 15 -> forward walking with reciprocal arm swing with light bells x 2 laps . -decompression on yellow noodle (straddled): cycling  Pt requires the buoyancy and hydrostatic pressure of water for support, and to offload joints by unweighting joint load by at least 50 % in navel deep water and by at least 75-80% in chest to neck deep water.  Viscosity of the water is needed for resistance of strengthening.  Water current perturbations provides challenge to standing balance requiring increased core activation.   PATIENT EDUCATION:  Education details: aquatic therapy progression/ modification Person educated: Patient Education method: Explanation Education comprehension: verbalized understanding  HOME EXERCISE PROGRAM: TBA  ASSESSMENT:  CLINICAL IMPRESSION: Pt reported gradual reductio of pain to 0/10.  Good tolerance for all exercises completed today. Will plan to create aquatic HEP to issue at end of month for preparation for d/c.       Initial impression: Patient is a 73 y.o. F who was seen today for physical therapy evaluation and treatment for Low back pain. Pt has already had to LB surgery x 4 years ago and reports she may need another.  MRI and next MD appt scheduled in the next 1-2 weeks.  She is active senior and states she does not let the pain slow her down too much. Objective testing demonstrate  weakness in LE and core with L>R. She also reports LLE numbness more frequent then right occurring 3/7 days a week.  Her pain does wake her almost hourly at night.  She will benefit from skilled Physical Therapy intervention. Will plan to begin in aquatics as she will benefit from the properties of water to progress towards goals with transition to land after decisions made regarding possible surgical intervention.  OBJECTIVE IMPAIRMENTS: decreased activity tolerance, difficulty walking, decreased strength, and pain.   ACTIVITY LIMITATIONS: carrying, lifting, bending, sitting, standing, squatting, sleeping, transfers, and locomotion level  PARTICIPATION LIMITATIONS: community activity and yard work, playing with grand children  PERSONAL FACTORS: 1-2 comorbidities: see problem lost  are also affecting patient's functional outcome.   REHAB POTENTIAL: Good  CLINICAL DECISION MAKING: Evolving/moderate complexity  EVALUATION COMPLEXITY: Moderate   GOALS: Goals reviewed with patient?  Yes  SHORT TERM GOALS: Target date: 02/08/24  Pt will tolerate full aquatic sessions consistently without increase in pain and with improving function to demonstrate good toleration and effectiveness of intervention.  Baseline: Goal status: met 01/31/24  2.  Pt will report reduction of pain and numbness with submersion by 2-3 NPRS Baseline:  Goal status: In progress 02/07/24 (inconsistent)  3.  Pt will recognize the benefit of aquatic intervention using the properties of water for strengthening and pain management Baseline:  Goal status: met 01/31/24   LONG TERM GOALS: Target date: 03/03/24  Pt to improve on ODI to 31% or less (MCID) to demonstrate statistically significant Improvement in function. Baseline: 20/45=44% Goal status: INITIAL  2.  Pt will reports increased hours of uninterrupted sleep to 3-4 hours due to decreasing pain in legs Baseline: maybe 1 hour Goal status: INITIAL  3.  Pt will report reduction of overall back pain during day by at least 50% Baseline: see chart Goal status: INITIAL  4.  Pt will improve strength in all areas listed by at least 5 lb to demonstrate improved overall physical function Baseline:  Goal status: INITIAL     PLAN:  PT FREQUENCY: 1-2x/week  PT DURATION: 6 weeks  PLANNED INTERVENTIONS: 97164- PT Re-evaluation, 97110-Therapeutic exercises, 97530- Therapeutic activity, 97112- Neuromuscular re-education, 97535- Self Care, 08657- Manual therapy, [id number]- Gait training, [phone]- Orthotic Fit/training, [phone]- Aquatic Therapy, [phone]- Ionotophoresis 4mg /ml Dexamethasone, Patient/Family education, Balance training, Stair training, Taping, Dry Needling, Joint mobilization, DME instructions, Cryotherapy, and Moist heat.  PLAN FOR NEXT SESSION: Aquatics only:  Almedia Jacobsen, PTA 02/10/24 10:26 AM [facility] [address] [place], [place], [address] Phone: [phone]   Fax:   [phone]     Referring diagnosis? M54.50 (ICD-10-CM) - Low back pain, unspecified  Treatment diagnosis? (if different than referring diagnosis) no What was this (referring dx) caused by? []  Surgery []  Fall [x]  Ongoing issue []  Arthritis []  Other: ____________  Laterality: []  Rt []  Lt [x]  Both  Check all possible CPT codes:  *CHOOSE 10 OR LESS*    See Planned Interventions listed in the Plan section of the Evaluation.

## 2024-02-14 ENCOUNTER — Ambulatory Visit (HOSPITAL_BASED_OUTPATIENT_CLINIC_OR_DEPARTMENT_OTHER): Admitting: Physical Therapy

## 2024-02-14 DIAGNOSIS — M6281 Muscle weakness (generalized): Secondary | ICD-10-CM

## 2024-02-14 DIAGNOSIS — M5459 Other low back pain: Secondary | ICD-10-CM

## 2024-02-14 NOTE — Patient Instructions (Signed)
 [email or web address]  SPRINT AQUATICS BELLS/BUOYS-MEDIUM RESISTANCE Item # [id number]  Product Details  Foam covered handles Resistance level = approximately 15 - 18 lbs Sold in pairs 60% resistance

## 2024-02-14 NOTE — Therapy (Addendum)
 OUTPATIENT PHYSICAL THERAPY THORACOLUMBAR TREATMENT   Patient Name: Carol Cox MRN: [id number] DOB:24-Oct-1951, 73 y.o., female Today's Date: 02/14/2024  END OF SESSION:  PT End of Session - 02/14/24 1146     Visit Number 8    Number of Visits 12    Date for PT Re-Evaluation 03/03/24    Authorization Type [employer] MCR    Progress Note Due on Visit 10    PT Start Time 1100    PT Stop Time 1140    PT Time Calculation (min) 40 min    Activity Tolerance Patient tolerated treatment well    Behavior During Therapy WFL for tasks assessed/performed               Past Medical History:  Diagnosis Date   Allergy    seasonal   Arthritis    "knee, left hip, ankles"    Borderline glaucoma    CAD (coronary artery disease)    Mild nonobstructive CAD by cath 02/06/13 (25% mid LAD), normal EF   Chronic low back pain    Colitis    Coronary arteriosclerosis    DDD (degenerative disc disease), lumbar    Degeneration of intervertebral disc of lumbar region    Family history of adverse reaction to anesthesia    mother has problems with nausea and vomiting    Glaucoma    History of colitis    02/ 2016  acute infectious colitis-- resolved   History of total knee arthroplasty    Hyperglycemia    Hyperlipidemia    Left ovarian cyst    Low back pain    with bilateral hip radiation    Lumbar radiculopathy    Lumbar spondylolysis    Obesity    Osteoporosis    ospenia of hips   Pain in joint of left shoulder    Pain of right hip joint    PONV (postoperative nausea and vomiting)    SEVERE   Past Surgical History:  Procedure Laterality Date   ABDOMINAL EXPOSURE N/A 05/01/2020   Procedure: ABDOMINAL EXPOSURE;  Surgeon: Mayo Speck, MD;  Location: [facility] OR;  Service: Vascular;  Laterality: N/A;   ANTERIOR LAT LUMBAR FUSION N/A 05/01/2020   Procedure: ANTERIOR LATERAL LUMBAR FUSION L4-S1;  Surgeon: Mort Ards, MD;  Location: [facility] OR;  Service: Orthopedics;  Laterality: N/A;  4.5  hrs Dr. Shirley Douglas to do approach tap block with exparel    CARPAL TUNNEL RELEASE Bilateral 1990's   KNEE ARTHROSCOPY W/ MENISCECTOMY Left 07/2014   LEFT HEART CATHETERIZATION WITH CORONARY ANGIOGRAM N/A 02/06/2013   Procedure: LEFT HEART CATHETERIZATION WITH CORONARY ANGIOGRAM;  Surgeon: Wenona Hamilton, MD;  Location: [facility] CATH LAB;  Service: Cardiovascular;  Laterality: N/A;  non-obstruction 25% mLAD,  normal LVF , ef 65-70%   left knee meniscus surgery      NASAL SINUS SURGERY     ovarian cyst     left ovary   ROTATOR CUFF REPAIR Right 10/2016   TOTAL KNEE ARTHROPLASTY Left 03/26/2015   Procedure: LEFT TOTAL KNEE ARTHROPLASTY;  Surgeon: Claiborne Crew, MD;  Location: [facility] ORS;  Service: Orthopedics;  Laterality: Left;   Patient Active Problem List   Diagnosis Date Noted   S/P lumbar fusion 05/01/2020   Hypertension 11/20/2019   Family history of early CAD 07/15/2017   Elevated blood pressure reading in office without diagnosis of hypertension 07/15/2017   Obese 03/27/2015   S/P left TKA 03/26/2015   S/P knee replacement 03/26/2015   Colitis, acute  12/01/2014   Weight loss, unintentional 12/01/2014   Adnexal cyst 12/01/2014   Elevated fasting glucose 12/01/2014   CAD (coronary artery disease) 12/01/2014   Osteoarthritis 12/01/2014   Vasovagal attack 02/28/2013   Hyperlipidemia 02/28/2013    PCP: Edger Goody MD  REFERRING PROVIDER: Mort Ards MD  REFERRING DIAG: M54.50 (ICD-10-CM) - Low back pain, unspecified   Rationale for Evaluation and Treatment: Rehabilitation  THERAPY DIAG:  Other low back pain  Muscle weakness (generalized)  ONSET DATE: 4 yr past year exacerbated  SUBJECTIVE:                                                                                                                                                                                           SUBJECTIVE STATEMENT: Pt reports that she is having less pain down her leg and slept better last night.     From evaluation:  Have has 2 back surgerys.  MRI Wed.  May be facing another surgery for back. Nerve pain down both legs with left leg sometimes go numb 3/7 days. Also tendonitis R>L.  Have own backyard pool. Wakes almost hourly due to pain  PERTINENT HISTORY:  L TKR R Knee OA  PAIN:  Are you having pain? Yes: NPRS scale: current 2/10 Pain location:  lower back Pain description: constant ache Aggravating factors: sidelying increase leg pain Relieving factors: sitting, resting.  Unable to take some meds  PRECAUTIONS: Fall  RED FLAGS: None   WEIGHT BEARING RESTRICTIONS: No  FALLS:  Has patient fallen in last 6 months? No  LIVING ENVIRONMENT: Lives with: lives with their spouse Lives in: House/apartment Stairs:  3-4 step going into home no handrail Has following equipment at home: None  OCCUPATION: retired.  PLOF: Independent  PATIENT GOALS: walk, work in yard, relaxing at night, decrease pain  NEXT MD VISIT: as needed  OBJECTIVE:  Note: Objective measures were completed at Evaluation unless otherwise noted.  DIAGNOSTIC FINDINGS:  None recent  PATIENT SURVEYS:  Modified Oswestry 44%   COGNITION: Overall cognitive status: Within functional limits for tasks assessed     SENSATION: Numbness LLE   POSTURE:  slight forward head  PALPATION: No TTP  LUMBAR ROM:   AROM eval  Flexion FT to mid shin P!  Extension Full P!  Right lateral flexion Limited 25%P!  Left lateral flexion Limited 25%P!  Right rotation   Left rotation    (Blank rows = not tested)  LOWER EXTREMITY ROM:     wfl  LOWER EXTREMITY MMT:    MMT Right eval Left eval  Hip flexion 29.6 28.2  Hip extension    Hip  abduction 24.4 22.5  Hip adduction    Hip internal rotation    Hip external rotation    Knee flexion    Knee extension 41.0 32.6  Ankle dorsiflexion    Ankle plantarflexion    Ankle inversion    Ankle eversion     (Blank rows = not tested)  LUMBAR SPECIAL TESTS:   Slump test: Positive  FUNCTIONAL TESTS:  5 times sit to stand: may be tested later as tolerated.  Not tolerated today Timed up and go (TUG): 14.52 4 stage balance: passed 1&2; Tandem 8s; SLS 5s  GAIT: Distance walked: 400 ft Assistive device utilized: None Level of assistance: Complete Independence Comments: slowed cadence  TREATMENT [facility] Adult PT Treatment:                                                DATE: 02/14/24 Pt seen for aquatic therapy today.  Treatment took place in water 3.5-4.75 ft in depth at the [facility] pool. Temp of water was 91.  Pt entered/exited the pool via stairs independently with bilat rail.  - unsupported walking forward/ backward x 2 laps each - side stepping with arm addct/ abdct -> with yellow hand floats x 3 laps - farmer carry with single yellow hand floats marching forward/ backward - TrA set with full noodle pull down to thighs x 5 in wide stance  ; oblique sets (pressing into water, 3-5 sec- @ 11 and 1 o'clock x5) - UE on yellow hand floats:  hip abdct/ addct x 10; leg swings into hip flex/ext  x10; heel raises  x 10;  - second set of above with UE on wall - wall push up/ off with TrA set for reactive balance   - Noodle stomp with solid noodle, light UE support on wall (good challenge) -walking between exercises for recovery  -decompression on yellow noodle (straddled): cycling  PATIENT EDUCATION:  Education details: aquatic therapy progression/ modification Person educated: Patient Education method: Explanation Education comprehension: verbalized understanding  HOME EXERCISE PROGRAM: AQUATIC Access Code: [email or web address] URL: [email or web address] Date: 02/14/2024 Prepared by: [facility] - [facility] Re[facility] - [address] This aquatic home exercise program from Med[employer] utilizes pictures from land based exercises, but has been adapted prior to lamination and issuance.  * not issued yet    ASSESSMENT:  CLINICAL  IMPRESSION: Pt with good tolerance to progression of aquatic exercises this date; no increase in pain.    Will continue to create aquatic HEP to issue at end of month for preparation for d/c. Therapist to assess LTG as time allows.     Initial impression: Patient is a 73 y.o. F who was seen today for physical therapy evaluation and treatment for Low back pain. Pt has already had to LB surgery x 4 years ago and reports she may need another.  MRI and next MD appt scheduled in the next 1-2 weeks.  She is active senior and states she does not let the pain slow her down too much. Objective testing demonstrate weakness in LE and core with L>R. She also reports LLE numbness more frequent then right occurring 3/7 days a week.  Her pain does wake her almost hourly at night.  She will benefit from skilled Physical Therapy intervention. Will plan to begin in aquatics as she will benefit from the properties of water to progress towards goals  with transition to land after decisions made regarding possible surgical intervention.  OBJECTIVE IMPAIRMENTS: decreased activity tolerance, difficulty walking, decreased strength, and pain.   ACTIVITY LIMITATIONS: carrying, lifting, bending, sitting, standing, squatting, sleeping, transfers, and locomotion level  PARTICIPATION LIMITATIONS: community activity and yard work, playing with grand children  PERSONAL FACTORS: 1-2 comorbidities: see problem lost  are also affecting patient's functional outcome.   REHAB POTENTIAL: Good  CLINICAL DECISION MAKING: Evolving/moderate complexity  EVALUATION COMPLEXITY: Moderate   GOALS: Goals reviewed with patient? Yes  SHORT TERM GOALS: Target date: 02/08/24  Pt will tolerate full aquatic sessions consistently without increase in pain and with improving function to demonstrate good toleration and effectiveness of intervention.  Baseline: Goal status: met 01/31/24  2.  Pt will report reduction of pain and numbness with  submersion by 2-3 NPRS Baseline:  Goal status: In progress 02/07/24 (inconsistent)  3.  Pt will recognize the benefit of aquatic intervention using the properties of water for strengthening and pain management Baseline:  Goal status: met 01/31/24   LONG TERM GOALS: Target date: 03/03/24  Pt to improve on ODI to 31% or less (MCID) to demonstrate statistically significant Improvement in function. Baseline: 20/45=44% Goal status: INITIAL  2.  Pt will reports increased hours of uninterrupted sleep to 3-4 hours due to decreasing pain in legs Baseline: maybe 1 hour Goal status: INITIAL  3.  Pt will report reduction of overall back pain during day by at least 50% Baseline: see chart Goal status: INITIAL  4.  Pt will improve strength in all areas listed by at least 5 lb to demonstrate improved overall physical function Baseline:  Goal status: INITIAL     PLAN:  PT FREQUENCY: 1-2x/week  PT DURATION: 6 weeks  PLANNED INTERVENTIONS: 97164- PT Re-evaluation, 97110-Therapeutic exercises, 97530- Therapeutic activity, 97112- Neuromuscular re-education, 97535- Self Care, 16109- Manual therapy, [phone]- Gait training, [phone]- Orthotic Fit/training, [phone]- Aquatic Therapy, [phone]- Ionotophoresis 4mg /ml Dexamethasone , Patient/Family education, Balance training, Stair training, Taping, Dry Needling, Joint mobilization, DME instructions, Cryotherapy, and Moist heat.  PLAN FOR NEXT SESSION: Aquatics only:  Almedia Jacobsen, PTA 02/14/24 12:38 PM [facility] [address] [place], [place], [address] Phone: [phone]   Fax:  [phone]    Referring diagnosis? M54.50 (ICD-10-CM) - Low back pain, unspecified  Treatment diagnosis? (if different than referring diagnosis) no What was this (referring dx) caused by? []  Surgery []  Fall [x]  Ongoing issue []  Arthritis []  Other: ____________  Laterality: []  Rt []  Lt [x]  Both  Check all  possible CPT codes:  *CHOOSE 10 OR LESS*    See Planned Interventions listed in the Plan section of the Evaluation.

## 2024-02-17 ENCOUNTER — Encounter (HOSPITAL_BASED_OUTPATIENT_CLINIC_OR_DEPARTMENT_OTHER): Payer: Self-pay | Admitting: Physical Therapy

## 2024-02-17 ENCOUNTER — Ambulatory Visit (HOSPITAL_BASED_OUTPATIENT_CLINIC_OR_DEPARTMENT_OTHER): Admitting: Physical Therapy

## 2024-02-17 DIAGNOSIS — M6281 Muscle weakness (generalized): Secondary | ICD-10-CM

## 2024-02-17 DIAGNOSIS — M5459 Other low back pain: Secondary | ICD-10-CM | POA: Diagnosis not present

## 2024-02-17 NOTE — Therapy (Signed)
 OUTPATIENT PHYSICAL THERAPY THORACOLUMBAR TREATMENT   Patient Name: Carol Cox MRN: [id number] DOB:06/02/1951, 73 y.o., female Today's Date: 02/17/2024  END OF SESSION:  PT End of Session - 02/17/24 1105     Visit Number 9    Number of Visits 12    Date for PT Re-Evaluation 03/03/24    Authorization Type [employer] MCR    Progress Note Due on Visit 10    PT Start Time 1100    PT Stop Time 1139    PT Time Calculation (min) 39 min    Activity Tolerance Patient tolerated treatment well    Behavior During Therapy WFL for tasks assessed/performed               Past Medical History:  Diagnosis Date   Allergy    seasonal   Arthritis    "knee, left hip, ankles"    Borderline glaucoma    CAD (coronary artery disease)    Mild nonobstructive CAD by cath 02/06/13 (25% mid LAD), normal EF   Chronic low back pain    Colitis    Coronary arteriosclerosis    DDD (degenerative disc disease), lumbar    Degeneration of intervertebral disc of lumbar region    Family history of adverse reaction to anesthesia    mother has problems with nausea and vomiting    Glaucoma    History of colitis    02/ 2016  acute infectious colitis-- resolved   History of total knee arthroplasty    Hyperglycemia    Hyperlipidemia    Left ovarian cyst    Low back pain    with bilateral hip radiation    Lumbar radiculopathy    Lumbar spondylolysis    Obesity    Osteoporosis    ospenia of hips   Pain in joint of left shoulder    Pain of right hip joint    PONV (postoperative nausea and vomiting)    SEVERE   Past Surgical History:  Procedure Laterality Date   ABDOMINAL EXPOSURE N/A 05/01/2020   Procedure: ABDOMINAL EXPOSURE;  Surgeon: Mayo Speck, MD;  Location: [facility] OR;  Service: Vascular;  Laterality: N/A;   ANTERIOR LAT LUMBAR FUSION N/A 05/01/2020   Procedure: ANTERIOR LATERAL LUMBAR FUSION L4-S1;  Surgeon: Mort Ards, MD;  Location: [facility] OR;  Service: Orthopedics;  Laterality: N/A;  4.5  hrs Dr. Shirley Douglas to do approach tap block with exparel    CARPAL TUNNEL RELEASE Bilateral 1990's   KNEE ARTHROSCOPY W/ MENISCECTOMY Left 07/2014   LEFT HEART CATHETERIZATION WITH CORONARY ANGIOGRAM N/A 02/06/2013   Procedure: LEFT HEART CATHETERIZATION WITH CORONARY ANGIOGRAM;  Surgeon: Wenona Hamilton, MD;  Location: [facility] CATH LAB;  Service: Cardiovascular;  Laterality: N/A;  non-obstruction 25% mLAD,  normal LVF , ef 65-70%   left knee meniscus surgery      NASAL SINUS SURGERY     ovarian cyst     left ovary   ROTATOR CUFF REPAIR Right 10/2016   TOTAL KNEE ARTHROPLASTY Left 03/26/2015   Procedure: LEFT TOTAL KNEE ARTHROPLASTY;  Surgeon: Claiborne Crew, MD;  Location: [facility] OR[facility];  Service: Orthopedics;  Laterality: Left;   Patient Active Problem List   Diagnosis Date Noted   S/P lumbar fusion 05/01/2020   Hypertension 11/20/2019   Family history of early CAD 07/15/2017   Elevated blood pressure reading in office without diagnosis of hypertension 07/15/2017   Obese 03/27/2015   S/P left TKA 03/26/2015   S/P knee replacement 03/26/2015   Colitis, acute  12/01/2014   Weight loss, unintentional 12/01/2014   Adnexal cyst 12/01/2014   Elevated fasting glucose 12/01/2014   CAD (coronary artery disease) 12/01/2014   Osteoarthritis 12/01/2014   Vasovagal attack 02/28/2013   Hyperlipidemia 02/28/2013    PCP: Edger Goody MD  REFERRING PROVIDER: Mort Ards MD  REFERRING DIAG: M54.50 (ICD-10-CM) - Low back pain, unspecified   Rationale for Evaluation and Treatment: Rehabilitation  THERAPY DIAG:  Other low back pain  Muscle weakness (generalized)  ONSET DATE: 4 yr past year exacerbated  SUBJECTIVE:                                                                                                                                                                                           SUBJECTIVE STATEMENT: Pt reports that is doing ok.  Pain is better in mornings; can get up to 5/10 in  afternoons depending on activity.     From evaluation:  Have has 2 back surgerys.  MRI Wed.  May be facing another surgery for back. Nerve pain down both legs with left leg sometimes go numb 3/7 days. Also tendonitis R>L.  Have own backyard pool. Wakes almost hourly due to pain  PERTINENT HISTORY:  L TKR R Knee OA  PAIN:  Are you having pain? Yes: NPRS scale: current 2/10 Pain location:  lower back Pain description: constant ache Aggravating factors: sidelying increase leg pain Relieving factors: sitting, resting.  Unable to take some meds  PRECAUTIONS: Fall  RED FLAGS: None   WEIGHT BEARING RESTRICTIONS: No  FALLS:  Has patient fallen in last 6 months? No  LIVING ENVIRONMENT: Lives with: lives with their spouse Lives in: House/apartment Stairs:  3-4 step going into home no handrail Has following equipment at home: None  OCCUPATION: retired.  PLOF: Independent  PATIENT GOALS: walk, work in yard, relaxing at night, decrease pain  NEXT MD VISIT: as needed  OBJECTIVE:  Note: Objective measures were completed at Evaluation unless otherwise noted.  DIAGNOSTIC FINDINGS:  None recent  PATIENT SURVEYS:  Modified Oswestry 44%   COGNITION: Overall cognitive status: Within functional limits for tasks assessed     SENSATION: Numbness LLE   POSTURE:  slight forward head  PALPATION: No TTP  LUMBAR ROM:   AROM eval  Flexion FT to mid shin P!  Extension Full P!  Right lateral flexion Limited 25%P!  Left lateral flexion Limited 25%P!  Right rotation   Left rotation    (Blank rows = not tested)  LOWER EXTREMITY ROM:     wfl  LOWER EXTREMITY MMT:    MMT Right eval Left eval  Hip flexion 29.6 28.2  Hip extension    Hip abduction 24.4 22.5  Hip adduction    Hip internal rotation    Hip external rotation    Knee flexion    Knee extension 41.0 32.6  Ankle dorsiflexion    Ankle plantarflexion    Ankle inversion    Ankle eversion     (Blank rows =  not tested)  LUMBAR SPECIAL TESTS:  Slump test: Positive  FUNCTIONAL TESTS:  5 times sit to stand: may be tested later as tolerated.  Not tolerated today Timed up and go (TUG): 14.52 4 stage balance: passed 1&2; Tandem 8s; SLS 5s  GAIT: Distance walked: 400 ft Assistive device utilized: None Level of assistance: Complete Independence Comments: slowed cadence  TREATMENT [facility] Adult PT Treatment:                                                DATE: 02/17/24 Pt seen for aquatic therapy today.  Treatment took place in water 3.5-4.75 ft in depth at the [facility] pool. Temp of water was 91.  Pt entered/exited the pool via stairs independently with bilat rail.  - unsupported walking forward/ backward x 2 laps each - side stepping with arm addct/ abdct -> with yellow hand floats x 3 laps - farmer carry with single blue hand floats marching forward/ backward - wide stance with med resistance bells:  reciprocal arm swing and bilat horiz abdct/ addct -> walking forward/ backward with reciprocal arm swing - UE on yellow hand floats:  hip abdct/ addct x 10; leg swings into hip flex/ext  x10; heel raises  x 10 -2 sets - TrA set with solid noodle pull down to thighs x 5 in wide stance  ; oblique sets (pressing into water, 3-5 sec- @ 11 and 1 o'clock x5) - staggered stance kick board row 2 x 10 (including vectors) - wall push up/ off with TrA set for reactive balance     PATIENT EDUCATION:  Education details: aquatic therapy progression/ modification Person educated: Patient Education method: Explanation Education comprehension: verbalized understanding  HOME EXERCISE PROGRAM: AQUATIC Access Code: [email or web address] URL: [email or web address] Date: 02/14/2024 Prepared by: [facility] - Outpatient Rehab - [address] This aquatic home exercise program from [employer] utilizes pictures from land based exercises, but has been adapted prior to lamination and issuance.      ASSESSMENT:  CLINICAL IMPRESSION: Pt with good tolerance to progression of aquatic exercises this date; no increase in pain.    Instructed on and issued aquatic HEP in preparation for d/c. Will continue review of exercises from HEP not completed this session; pt to bring back HEP for possible notes/modifications.  Therapist to assess goals for 10th visit progress note next visit.     Initial impression: Patient is a 73 y.o. F who was seen today for physical therapy evaluation and treatment for Low back pain. Pt has already had to LB surgery x 4 years ago and reports she may need another.  MRI and next MD appt scheduled in the next 1-2 weeks.  She is active senior and states she does not let the pain slow her down too much. Objective testing demonstrate weakness in LE and core with L>R. She also reports LLE numbness more frequent then right occurring 3/7 days a week.  Her pain does wake her almost hourly at night.  She will benefit from  skilled Physical Therapy intervention. Will plan to begin in aquatics as she will benefit from the properties of water to progress towards goals with transition to land after decisions made regarding possible surgical intervention.  OBJECTIVE IMPAIRMENTS: decreased activity tolerance, difficulty walking, decreased strength, and pain.   ACTIVITY LIMITATIONS: carrying, lifting, bending, sitting, standing, squatting, sleeping, transfers, and locomotion level  PARTICIPATION LIMITATIONS: community activity and yard work, playing with grand children  PERSONAL FACTORS: 1-2 comorbidities: see problem lost  are also affecting patient's functional outcome.   REHAB POTENTIAL: Good  CLINICAL DECISION MAKING: Evolving/moderate complexity  EVALUATION COMPLEXITY: Moderate   GOALS: Goals reviewed with patient? Yes  SHORT TERM GOALS: Target date: 02/08/24  Pt will tolerate full aquatic sessions consistently without increase in pain and with improving function to  demonstrate good toleration and effectiveness of intervention.  Baseline: Goal status: met 01/31/24  2.  Pt will report reduction of pain and numbness with submersion by 2-3 NPRS Baseline:  Goal status: In progress 02/07/24 (inconsistent)  3.  Pt will recognize the benefit of aquatic intervention using the properties of water for strengthening and pain management Baseline:  Goal status: met 01/31/24   LONG TERM GOALS: Target date: 03/03/24  Pt to improve on ODI to 31% or less (MCID) to demonstrate statistically significant Improvement in function. Baseline: 20/45=44% Goal status: INITIAL  2.  Pt will reports increased hours of uninterrupted sleep to 3-4 hours due to decreasing pain in legs Baseline: maybe 1 hour Goal status: INITIAL  3.  Pt will report reduction of overall back pain during day by at least 50% Baseline: see chart Goal status: INITIAL  4.  Pt will improve strength in all areas listed by at least 5 lb to demonstrate improved overall physical function Baseline:  Goal status: INITIAL     PLAN:  PT FREQUENCY: 1-2x/week  PT DURATION: 6 weeks  PLANNED INTERVENTIONS: 97164- PT Re-evaluation, 97110-Therapeutic exercises, 97530- Therapeutic activity, 97112- Neuromuscular re-education, 97535- Self Care, 09811- Manual therapy, [phone]- Gait training, [phone]- Orthotic Fit/training, [phone]- Aquatic Therapy, [phone]- Ionotophoresis 4mg /ml Dexamethasone , Patient/Family education, Balance training, Stair training, Taping, Dry Needling, Joint mobilization, DME instructions, Cryotherapy, and Moist heat.  PLAN FOR NEXT SESSION: Aquatics only:   Almedia Jacobsen, PTA 02/17/24 11:52 AM [facility] [address] [place], [place], [address] Phone: [phone]   Fax:  [phone]    Referring diagnosis? M54.50 (ICD-10-CM) - Low back pain, unspecified  Treatment diagnosis? (if different than referring diagnosis) no What was  this (referring dx) caused by? []  Surgery []  Fall [x]  Ongoing issue []  Arthritis []  Other: ____________  Laterality: []  Rt []  Lt [x]  Both  Check all possible CPT codes:  *CHOOSE 10 OR LESS*    See Planned Interventions listed in the Plan section of the Evaluation.

## 2024-02-21 ENCOUNTER — Ambulatory Visit (HOSPITAL_BASED_OUTPATIENT_CLINIC_OR_DEPARTMENT_OTHER): Admitting: Physical Therapy

## 2024-02-21 ENCOUNTER — Encounter (HOSPITAL_BASED_OUTPATIENT_CLINIC_OR_DEPARTMENT_OTHER): Payer: Self-pay | Admitting: Physical Therapy

## 2024-02-21 DIAGNOSIS — M5459 Other low back pain: Secondary | ICD-10-CM

## 2024-02-21 DIAGNOSIS — M6281 Muscle weakness (generalized): Secondary | ICD-10-CM

## 2024-02-21 NOTE — Therapy (Addendum)
 OUTPATIENT PHYSICAL THERAPY THORACOLUMBAR TREATMENT PHYSICAL THERAPY DISCHARGE SUMMARY  Visits from Start of Care: 10  Current functional level related to goals / functional outcomes: Indep but with pain   Remaining deficits: pain   Education / Equipment: Management of condition/HEP   Patient agrees to discharge. Patient goals were partially met. Patient is being discharged due to met max potential, has scheduled surgical procedure, not returning after last visit.  Addend Adriana Hopping Laneta Pintos) Yuto Cajuste MPT 03/15/24 10:35 AM [facility] [address] [place], [place], [address] Phone: [phone]   Fax:  [phone]     Progress Note Reporting Period 01/17/24 to 02/21/24  See note below for Objective Data and Assessment of Progress/Goals.      Patient Name: Carol Cox MRN: [id number] DOB:December 06, 1950, 73 y.o., female Today's Date: 02/21/2024  END OF SESSION:  PT End of Session - 02/21/24 1046     Visit Number 10    Number of Visits 12    Date for PT Re-Evaluation 03/03/24    Authorization Type [employer] MCR    Progress Note Due on Visit 20    PT Start Time 1015    PT Stop Time 1100    PT Time Calculation (min) 45 min    Activity Tolerance Patient tolerated treatment well    Behavior During Therapy WFL for tasks assessed/performed                Past Medical History:  Diagnosis Date   Allergy    seasonal   Arthritis    "knee, left hip, ankles"    Borderline glaucoma    CAD (coronary artery disease)    Mild nonobstructive CAD by cath 02/06/13 (25% mid LAD), normal EF   Chronic low back pain    Colitis    Coronary arteriosclerosis    DDD (degenerative disc disease), lumbar    Degeneration of intervertebral disc of lumbar region    Family history of adverse reaction to anesthesia    mother has problems with nausea and vomiting    Glaucoma    History of colitis    02/ 2016  acute infectious colitis--  resolved   History of total knee arthroplasty    Hyperglycemia    Hyperlipidemia    Left ovarian cyst    Low back pain    with bilateral hip radiation    Lumbar radiculopathy    Lumbar spondylolysis    Obesity    Osteoporosis    ospenia of hips   Pain in joint of left shoulder    Pain of right hip joint    PONV (postoperative nausea and vomiting)    SEVERE   Past Surgical History:  Procedure Laterality Date   ABDOMINAL EXPOSURE N/A 05/01/2020   Procedure: ABDOMINAL EXPOSURE;  Surgeon: Mayo Speck, MD;  Location: [facility] OR;  Service: Vascular;  Laterality: N/A;   ANTERIOR LAT LUMBAR FUSION N/A 05/01/2020   Procedure: ANTERIOR LATERAL LUMBAR FUSION L4-S1;  Surgeon: Mort Ards, MD;  Location: [facility] OR;  Service: Orthopedics;  Laterality: N/A;  4.5 hrs Dr. Shirley Douglas to do approach tap block with exparel    CARPAL TUNNEL RELEASE Bilateral 1990's   KNEE ARTHROSCOPY W/ MENISCECTOMY Left 07/2014   LEFT HEART CATHETERIZATION WITH CORONARY ANGIOGRAM N/A 02/06/2013   Procedure: LEFT HEART CATHETERIZATION WITH CORONARY ANGIOGRAM;  Surgeon: Wenona [place], MD;  Location: [facility] CATH LAB;  Service: Cardiovascular;  Laterality: N/A;  non-obstruction 25% mLAD,  normal LVF , ef 65-70%  left knee meniscus surgery      NASAL SINUS SURGERY     ovarian cyst     left ovary   ROTATOR CUFF REPAIR Right 10/2016   TOTAL KNEE ARTHROPLASTY Left 03/26/2015   Procedure: LEFT TOTAL KNEE ARTHROPLASTY;  Surgeon: Claiborne Crew, MD;  Location: [facility] ORS;  Service: Orthopedics;  Laterality: Left;   Patient Active Problem List   Diagnosis Date Noted   S/P lumbar fusion 05/01/2020   Hypertension 11/20/2019   Family history of early CAD 07/15/2017   Elevated blood pressure reading in office without diagnosis of hypertension 07/15/2017   Obese 03/27/2015   S/P left TKA 03/26/2015   S/P knee replacement 03/26/2015   Colitis, acute 12/01/2014   Weight loss, unintentional 12/01/2014   Adnexal cyst 12/01/2014   Elevated fasting  glucose 12/01/2014   CAD (coronary artery disease) 12/01/2014   Osteoarthritis 12/01/2014   Vasovagal attack 02/28/2013   Hyperlipidemia 02/28/2013    PCP: Edger Goody MD  REFERRING PROVIDER: Mort Ards MD  REFERRING DIAG: M54.50 (ICD-10-CM) - Low back pain, unspecified   Rationale for Evaluation and Treatment: Rehabilitation  THERAPY DIAG:  Other low back pain  Muscle weakness (generalized)  ONSET DATE: 4 yr past year exacerbated  SUBJECTIVE:                                                                                                                                                                                           SUBJECTIVE STATEMENT: Pt reports increase in pain sensitivity over w/e.   From evaluation:  Have has 2 back surgerys.  MRI Wed.  May be facing another surgery for back. Nerve pain down both legs with left leg sometimes go numb 3/7 days. Also tendonitis R>L.  Have own backyard pool. Wakes almost hourly due to pain  PERTINENT HISTORY:  L TKR R Knee OA  PAIN:  Are you having pain? Yes: NPRS scale: current 4/10 Pain location:  lower back Pain description: constant ache Aggravating factors: sidelying increase leg pain Relieving factors: sitting, resting.  Unable to take some meds  PRECAUTIONS: Fall  RED FLAGS: None   WEIGHT BEARING RESTRICTIONS: No  FALLS:  Has patient fallen in last 6 months? No  LIVING ENVIRONMENT: Lives with: lives with their spouse Lives in: House/apartment Stairs: 3-4 step going into home no handrail Has following equipment at home: None  OCCUPATION: retired.  PLOF: Independent  PATIENT GOALS: walk, work in yard, relaxing at night, decrease pain  NEXT MD VISIT: as needed  OBJECTIVE:  Note: Objective measures were completed at Evaluation unless otherwise noted.  DIAGNOSTIC FINDINGS:  None recent  PATIENT SURVEYS:  Modified Oswestry 44%  02/21/24 11/45=24%  COGNITION: Overall cognitive status: Within  functional limits for tasks assessed     SENSATION: Numbness LLE   POSTURE: slight forward head  PALPATION: No TTP  LUMBAR ROM:   AROM eval  Flexion FT to mid shin P!  Extension Full P!  Right lateral flexion Limited 25%P!  Left lateral flexion Limited 25%P!  Right rotation   Left rotation    (Blank rows = not tested)  LOWER EXTREMITY ROM:     wfl  LOWER EXTREMITY MMT:    MMT Right eval Left eval R / L 02/21/24  Hip flexion 29.6 28.2 35.6 /  38.3  Hip extension     Hip abduction 24.4 22.5 29.3 / 29.2  Hip adduction     Hip internal rotation     Hip external rotation     Knee flexion     Knee extension 41.0 32.6   Ankle dorsiflexion     Ankle plantarflexion     Ankle inversion     Ankle eversion      (Blank rows = not tested)  LUMBAR SPECIAL TESTS:  Slump test: Positive  FUNCTIONAL TESTS:  5 times sit to stand: may be tested later as tolerated.  Not tolerated today Timed up and go (TUG): 14.52 4 stage balance:passed 1&2; Tandem 8s; SLS 5s  GAIT: Distance walked: 400 ft Assistive device utilized: None Level of assistance: Complete Independence Comments: slowed cadence  TREATMENT [facility] Adult PT Treatment:                                                DATE: 02/21/24 Pt seen for aquatic therapy today.  Treatment took place in water 3.5-4.75 ft in depth at the [facility] pool. Temp of water was 91.  Pt entered/exited the pool via stairs independently with bilat rail.   Exercises - walking forward/ backward    - Side Stepping with or without Hand floats    - Farmer's Walk holding hand float at side under water   - Pool noodle/  hand float pull down to front of thighs   - Forward Backward Leg Swing - hold wall or hand floats  - Leg Swings Side to Side - hold wall or hand floats    - Staggered Stance Row with Kick Board   - Standing Noodle Stomp (Gentle)/ with or without hand on wall    - Wall Push Up and Off    - Seated on noodle, bicycle  legs    - Standing Hamstring Stretch With Foot on Stairs in Pool     PATIENT EDUCATION:  Education details: aquatic therapy progression/ modification Person educated: Patient Education method: Explanation Education comprehension: verbalized understanding  HOME EXERCISE PROGRAM: AQUATIC Access Code: [email or web address] URL: [email or web address] Date: 02/14/2024 Prepared by: [facility] This aquatic home exercise program from [employer] utilizes pictures from land based exercises, but has been adapted prior to lamination and issuance.     ASSESSMENT:  CLINICAL IMPRESSION: PN pt reports she has had some nights where she wakes at night 1 x night but on the avg she continues to get up 3 x night with avg sleep time of ~ 4 hours meeting LTG.  Her overall pain sensitivity is reduced by at least "25%". ODI complete with improvement > MCID. Pt has been  issued HEP.  Further instruction given to ensure understanding and indep today requiring Verbal and written clarifications. Pt may return for 1-2 more sessions for further progression towards stated goals as able.  She is preparing for surgical intervention.  VU of importance of continuing with program pre surgery to maintain/progress strength for optimal outcome.  She will continue to benefit from skilled PT intervention.     OBJECTIVE IMPAIRMENTS: decreased activity tolerance, difficulty walking, decreased strength, and pain.   ACTIVITY LIMITATIONS: carrying, lifting, bending, sitting, standing, squatting, sleeping, transfers, and locomotion level  PARTICIPATION LIMITATIONS: community activity and yard work, playing with grand children  PERSONAL FACTORS: 1-2 comorbidities: see problem lost are also affecting patient's functional outcome.   REHAB POTENTIAL: Good  CLINICAL DECISION MAKING: Evolving/moderate complexity  EVALUATION COMPLEXITY: Moderate   GOALS: Goals reviewed with patient? Yes  SHORT  TERM GOALS: Target date: 02/08/24  Pt will tolerate full aquatic sessions consistently without increase in pain and with improving function to demonstrate good toleration and effectiveness of intervention.  Baseline: Goal status: met 01/31/24  2.  Pt will report reduction of pain and numbness with submersion by 2-3 NPRS Baseline:  Goal status: In progress 02/07/24 (inconsistent).   3.  Pt will recognize the benefit of aquatic intervention using the properties of water for strengthening and pain management Baseline:  Goal status: met 01/31/24   LONG TERM GOALS: Target date: 03/03/24  Pt to improve on ODI to 31% or less (MCID) to demonstrate statistically significant Improvement in function. Baseline: 20/45=44%;  11/45= 24% 02/21/24 Goal status: INITIAL  2.  Pt will reports increased hours of uninterrupted sleep to 3-4 hours due to decreasing pain in legs Baseline: maybe 1 hour Goal status: Met 02/21/24  3.  Pt will report reduction of overall back pain during day by at least 50% Baseline: see chart "at least 25%" 02/21/24 Goal status: ongoing  4.  Pt will improve strength in all areas listed by at least 5 lb to demonstrate improved overall physical function Baseline:  Goal status: met 02/21/24     PLAN:  PT FREQUENCY: 1-2x/week  PT DURATION: 6 weeks  PLANNED INTERVENTIONS: 97164- PT Re-evaluation, 97110-Therapeutic exercises, 97530- Therapeutic activity, 97112- Neuromuscular re-education, 97535- Self Care, 82956- Manual therapy, [phone]- Gait training, [phone]- Orthotic Fit/training, [phone]- Aquatic Therapy, [phone]- Ionotophoresis 4mg /ml Dexamethasone , Patient/Family education, Balance training, Stair training, Taping, Dry Needling, Joint mobilization, DME instructions, Cryotherapy, and Moist heat.  PLAN FOR NEXT SESSION: Aquatics only:   Adriana Hopping [place]) Thom Ollinger MPT 02/21/24 11:55 AM [facility] [address] [place], [place],  [address] Phone: [phone]   Fax:  [phone]     Referring diagnosis? M54.50 (ICD-10-CM) - Low back pain, unspecified  Treatment diagnosis? (if different than referring diagnosis) no What was this (referring dx) caused by? []  Surgery []  Fall [x]  Ongoing issue []  Arthritis []  Other: ____________  Laterality: []  Rt []  Lt [x]  Both  Check all possible CPT codes:  *CHOOSE 10 OR LESS*    See Planned Interventions listed in the Plan section of the Evaluation.

## 2024-02-22 ENCOUNTER — Encounter (HOSPITAL_BASED_OUTPATIENT_CLINIC_OR_DEPARTMENT_OTHER): Payer: Self-pay | Admitting: Emergency Medicine

## 2024-02-22 ENCOUNTER — Emergency Department (HOSPITAL_BASED_OUTPATIENT_CLINIC_OR_DEPARTMENT_OTHER)
Admission: EM | Admit: 2024-02-22 | Discharge: 2024-02-22 | Disposition: A | Discharge status: Home / Self Care | Attending: Emergency Medicine | Admitting: Emergency Medicine

## 2024-02-22 ENCOUNTER — Other Ambulatory Visit: Payer: Self-pay

## 2024-02-22 ENCOUNTER — Emergency Department (HOSPITAL_BASED_OUTPATIENT_CLINIC_OR_DEPARTMENT_OTHER)

## 2024-02-22 DIAGNOSIS — R519 Headache, unspecified: Secondary | ICD-10-CM | POA: Diagnosis present

## 2024-02-22 DIAGNOSIS — I2510 Atherosclerotic heart disease of native coronary artery without angina pectoris: Secondary | ICD-10-CM | POA: Diagnosis not present

## 2024-02-22 DIAGNOSIS — Z79899 Other long term (current) drug therapy: Secondary | ICD-10-CM | POA: Insufficient documentation

## 2024-02-22 DIAGNOSIS — Z7982 Long term (current) use of aspirin: Secondary | ICD-10-CM | POA: Diagnosis not present

## 2024-02-22 DIAGNOSIS — I10 Essential (primary) hypertension: Secondary | ICD-10-CM | POA: Diagnosis not present

## 2024-02-22 LAB — CBC WITH DIFFERENTIAL/PLATELET
Abs Immature Granulocytes: 0.02 10*3/uL (ref 0.00–0.07)
Basophils Absolute: 0.1 10*3/uL (ref 0.0–0.1)
Basophils Relative: 1 %
Eosinophils Absolute: 0.2 10*3/uL (ref 0.0–0.5)
Eosinophils Relative: 3 %
HCT: 43.6 % (ref 36.0–46.0)
Hemoglobin: 14.7 g/dL (ref 12.0–15.0)
Immature Granulocytes: 0 %
Lymphocytes Relative: 44 %
Lymphs Abs: 2.9 10*3/uL (ref 0.7–4.0)
MCH: 32.9 pg (ref 26.0–34.0)
MCHC: 33.7 g/dL (ref 30.0–36.0)
MCV: 97.5 fL (ref 80.0–100.0)
Monocytes Absolute: 0.5 10*3/uL (ref 0.1–1.0)
Monocytes Relative: 8 %
Neutro Abs: 2.9 10*3/uL (ref 1.7–7.7)
Neutrophils Relative %: 44 %
Platelets: 266 10*3/uL (ref 150–400)
RBC: 4.47 MIL/uL (ref 3.87–5.11)
RDW: 13.2 % (ref 11.5–15.5)
WBC: 6.5 10*3/uL (ref 4.0–10.5)
nRBC: 0 % (ref 0.0–0.2)

## 2024-02-22 LAB — COMPREHENSIVE METABOLIC PANEL WITH GFR
ALT: 32 U/L (ref 0–44)
AST: 34 U/L (ref 15–41)
Albumin: 4.6 g/dL (ref 3.5–5.0)
Alkaline Phosphatase: 65 U/L (ref 38–126)
Anion gap: 9 (ref 5–15)
BUN: 18 mg/dL (ref 8–23)
CO2: 25 mmol/L (ref 22–32)
Calcium: 10.4 mg/dL — ABNORMAL HIGH (ref 8.9–10.3)
Chloride: 107 mmol/L (ref 98–111)
Creatinine, Ser: 1.04 mg/dL — ABNORMAL HIGH (ref 0.44–1.00)
GFR, Estimated: 57 mL/min — ABNORMAL LOW (ref >60)
Glucose, Bld: 105 mg/dL — ABNORMAL HIGH (ref 70–99)
Potassium: 4.8 mmol/L (ref 3.5–5.1)
Sodium: 141 mmol/L (ref 135–145)
Total Bilirubin: 0.3 mg/dL (ref 0.0–1.2)
Total Protein: 7.1 g/dL (ref 6.5–8.1)

## 2024-02-22 LAB — SEDIMENTATION RATE: Sed Rate: 3 mm/h (ref 0–22)

## 2024-02-22 LAB — C-REACTIVE PROTEIN: CRP: <0.5 mg/dL (ref <1.0)

## 2024-02-22 MED ORDER — VALACYCLOVIR HCL 1 G PO TABS: 1000.0000 mg | ORAL_TABLET | Freq: Three times a day (TID) | ORAL | 0 refills | Status: AC

## 2024-02-22 MED ORDER — TRAMADOL HCL 50 MG PO TABS: 50.0000 mg | ORAL_TABLET | Freq: Four times a day (QID) | ORAL | 0 refills | Status: DC | PRN

## 2024-02-22 NOTE — ED Triage Notes (Signed)
 Pt c/o intermittent L sided headache radiating down to cheek, states sometimes it "goes into her ear" for the last 2 weeks.  Pain 5/10

## 2024-02-22 NOTE — ED Provider Notes (Signed)
 3:30 PM Care assumed from Dr. Zackowski.  At time of transfer of care, patient awaiting results of ESR and CRP.  Patient presented with headache and has had other reassuring workup thus far.  Given the patient's concern for possible temporal arteritis, if ESR and CRP are reassuring, plan agreeable to discharge home.  If both positive, patient may need discussion with neurology.  If she is able to go home, previous team suspected she may need a prescription for a medicine such as tramadol to help with discomfort.  Dissipate discharge if workup reassuring.  5:40 PM Patient's ESR was normal.  We had a shared Szymon conversation about waiting much longer for the CRP and although the first was reassuring we had low suspicion it would change her plan at this time.  Patient is amenable to going home and follow-up with her PCP for the result.  Due to the patient's location of pain, we agreed to give her prescription valacyclovir just in case she notices rash developing on her face that would indicate shingles so she does not to come back here.  We also get a prescription for some pain medicine.  Patient agrees with plan will follow-up with her PCP.  Due to the patient's her concerns and was discharged in good condition.   Clinical Impression: 1. Intractable headache, unspecified chronicity pattern, unspecified headache type   2. Primary hypertension     Disposition: Discharge  Condition: Good  I have discussed the results, Dx and Tx plan with the pt(& family if present). He/she/they expressed understanding and agree(s) with the plan. Discharge instructions discussed at great length. Strict return precautions discussed and pt &/or family have verbalized understanding of the instructions. No further questions at time of discharge.    New Prescriptions   TRAMADOL (ULTRAM) 50 MG TABLET    Take 1 tablet (50 mg total) by mouth every 6 (six) hours as needed.   VALACYCLOVIR (VALTREX) 1000 MG TABLET    Take 1  tablet (1,000 mg total) by mouth 3 (three) times daily for 7 days.    Follow Up: Tura Gaines, MD [id number] [place] [address] [phone]  Schedule an appointment as soon as possible for a visit       Reeanna Acri, Marine Sia, MD 02/22/24 1743

## 2024-02-22 NOTE — ED Provider Notes (Addendum)
 [place] EMERGENCY DEPARTMENT AT [facility] Provider Note   CSN: [id number] Arrival date & time: 02/22/24  1020     History  Chief Complaint  Patient presents with   Headache    Carol Cox is a 73 y.o. female.  Patient with a complaint of left-sided headache from the temple area behind the left ear and down into the upper portion of the left neck.  Not really involving left upper extremity.  This been going on for 2 weeks.  Some mild intermittent ear discomfort.  Patient states pain currently is 5 out of 10.  Patient is never had anything like this before.  No fall or injury.  No visual changes.  No rash.  Past medical history significant for hyperlipidemia coronary artery disease lumbar radiculopathy chronic low back pain.  Patient is never used tobacco products.  Patient without history of similar headaches or any headache history.  No nausea or vomiting no fevers.  Temp here 98.6.  Blood pressure is elevated some at 156/92       Home Medications Prior to Admission medications   Medication Sig Start Date End Date Taking? Authorizing Provider  diclofenac (VOLTAREN) 75 MG EC tablet Take 75 mg by mouth 2 (two) times daily. 02/14/24  Yes [provider]  meloxicam  (MOBIC ) 15 MG tablet Take 15 mg by mouth daily as needed. 01/17/24  Yes [provider]  aspirin  EC 81 MG tablet Take 81 mg by mouth daily.    [provider]  Calcium  200 MG TABS     [provider]  Cholecalciferol (VITAMIN D3) 50 MCG (2000 UT) capsule     [provider]  dorzolamide -timolol  (COSOPT ) 22.3-6.8 MG/ML ophthalmic solution Place 1 drop into both eyes in the morning and at bedtime. 04/09/20   [provider]  Evolocumab  (REPATHA  SURECLICK) 140 MG/ML SOAJ INJECT 1 PEN INTO THE SKIN EVERY 14 (FOURTEEN) DAYS. 05/10/23   Lucendia Rusk, MD  ezetimibe  (ZETIA ) 10 MG tablet TAKE 1 TABLET BY MOUTH EVERY DAY 01/17/24   Lucendia Rusk, MD   lisinopril  (ZESTRIL ) 20 MG tablet TAKE 1 TABLET BY MOUTH EVERY DAY 01/17/24   Lucendia Rusk, MD  meloxicam  (MOBIC ) 7.5 MG tablet Take 1 tablet (7.5 mg total) by mouth daily. Patient not taking: Reported on 07/08/2023 11/24/22   Auston Blush, MD  Omega-3 Fatty Acids (FISH OIL) 1000 MG CAPS Take 2 capsules by mouth daily.    [provider]  Pediatric Multivitamins-Fl (MULTIVITAMIN + FLUORIDE) 0.25 MG CHEW     [provider]  psyllium (METAMUCIL) 58.6 % powder Take 1 packet by mouth as needed (constipation).    [provider]  Travoprost, BAK Free, (TRAVATAN) 0.004 % SOLN ophthalmic solution Place 1 drop into both eyes at bedtime.     [provider]      Allergies    Atorvastatin and Rosuvastatin     Review of Systems   Review of Systems  Constitutional:  Negative for chills and fever.  HENT:  Positive for ear pain. Negative for sore throat.   Eyes:  Negative for photophobia, pain, discharge, redness and visual disturbance.  Respiratory:  Negative for cough and shortness of breath.   Cardiovascular:  Negative for chest pain and palpitations.  Gastrointestinal:  Negative for abdominal pain and vomiting.  Genitourinary:  Negative for dysuria and hematuria.  Musculoskeletal:  Positive for neck pain. Negative for arthralgias, back pain and neck stiffness.  Skin:  Negative for color  change and rash.  Neurological:  Positive for headaches. Negative for seizures, syncope, speech difficulty, weakness and numbness.  All other systems reviewed and are negative.   Physical Exam Updated Vital Signs BP (!) 140/75   Pulse 70   Temp 98.6 F (37 C)   Resp 16   Ht 1.651 m (5\' 5" )   Wt 86.2 kg   LMP  (LMP Unknown)   SpO2 96%   BMI 31.62 kg/m  Physical Exam Vitals and nursing note reviewed.  Constitutional:      General: She is not in acute distress.    Appearance: Normal appearance. She is well-developed.  HENT:     Head: Normocephalic and  atraumatic.     Left Ear: Tympanic membrane, ear canal and external ear normal.  Eyes:     Extraocular Movements: Extraocular movements intact.     Conjunctiva/sclera: Conjunctivae normal.     Pupils: Pupils are equal, round, and reactive to light.  Cardiovascular:     Rate and Rhythm: Normal rate and regular rhythm.     Heart sounds: No murmur heard. Pulmonary:     Effort: Pulmonary effort is normal. No respiratory distress.     Breath sounds: Normal breath sounds.  Abdominal:     Palpations: Abdomen is soft.     Tenderness: There is no abdominal tenderness.  Musculoskeletal:        General: No swelling.     Cervical back: Normal range of motion and neck supple. No rigidity.  Skin:    General: Skin is warm and dry.     Capillary Refill: Capillary refill takes less than 2 seconds.     Findings: No rash.  Neurological:     General: No focal deficit present.     Mental Status: She is alert and oriented to person, place, and time.     Cranial Nerves: No cranial nerve deficit.     Sensory: No sensory deficit.     Motor: No weakness.  Psychiatric:        Mood and Affect: Mood normal.     ED Results / Procedures / Treatments   Labs (all labs ordered are listed, but only abnormal results are displayed) Labs Reviewed  SEDIMENTATION RATE  C-REACTIVE PROTEIN  CBC WITH DIFFERENTIAL/PLATELET  COMPREHENSIVE METABOLIC PANEL WITH GFR    EKG None  Radiology No results found.  Procedures Procedures    Medications Ordered in ED Medications - No data to display  ED Course/ Medical Decision Making/ A&P                                 Medical Decision Making Amount and/or Complexity of Data Reviewed Labs: ordered. Radiology: ordered.   Clinically symptoms could be associated with shingles without rash.  Could perhaps represent temporal arteritis.  But kind of unusual location with it being from the temporal area back around behind the ear.  Will get ESR and CRP to help  differentiate this.  Will get head CT.  Patient's sed rate C-reactive protein pending.  CBC normal white count complete metabolic panel other than creatinine being 1.04 for GFR 57 no acute abnormalities including liver function test.  CT head no evidence of any acute intercranial abnormality.  Disposition will be based on sed rate and CRP.  Patient will want some pain medication for discharge home if she is discharged home.   Final Clinical Impression(s) / ED Diagnoses Final diagnoses:  Intractable headache, unspecified chronicity pattern, unspecified headache type  Primary hypertension    Rx / DC Orders ED Discharge Orders     None         Nicklas Barns, MD 02/22/24 1220    Nicklas Barns, MD 02/22/24 [phone]

## 2024-02-22 NOTE — Discharge Instructions (Addendum)
 Make an appointment follow-up with your primary care doctor.  Continue to take your Mobic .  Please use the extra pain medicine help with your symptoms of a continue to bother you and if you develop a rash as we discussed they could indicate shingles so please take the valacyclovir.  Please rest and stay hydrated and follow-up with your primary doctor.  If any symptoms change or worsen acutely, return to the nearest emergency department.

## 2024-02-22 NOTE — ED Notes (Signed)
 Patient transported to CT

## 2024-02-24 ENCOUNTER — Ambulatory Visit (HOSPITAL_BASED_OUTPATIENT_CLINIC_OR_DEPARTMENT_OTHER): Admitting: Physical Therapy

## 2024-03-24 ENCOUNTER — Ambulatory Visit (HOSPITAL_COMMUNITY): Payer: Self-pay | Admitting: Physician Assistant

## 2024-03-24 NOTE — Pre-Procedure Instructions (Signed)
 Surgical Instructions   Your procedure is scheduled on April 03, 2024. Report to [facility] Main Entrance "A" at 5:30 A.M., then check in with the Admitting office. Any questions or running late day of surgery: call [phone]  Questions prior to your surgery date: call [phone], Monday-Friday, 8am-4pm. If you experience any cold or flu symptoms such as cough, fever, chills, shortness of breath, etc. between now and your scheduled surgery, please notify us  at the above number.     Remember:  Do not eat after midnight the night before your surgery  You may drink clear liquids until 4:30 AM the morning of your surgery.   Clear liquids allowed are: Water, Non-Citrus Juices (without pulp), Carbonated Beverages, Clear Tea (no milk, honey, etc.), Black Coffee Only (NO MILK, CREAM OR POWDERED CREAMER of any kind), and Gatorade.    Take these medicines the morning of surgery with A SIP OF WATER: dorzolamide -timolol  (COSOPT ) ophthalmic solution  ezetimibe  (ZETIA )    Follow your surgeon's instructions on when to stop Aspirin .  If no instructions were given by your surgeon then you will need to call the office to get those instructions.     One week prior to surgery, STOP taking any Aleve, Naproxen, Ibuprofen , Motrin , Advil , Goody's, BC's, all herbal medications, fish oil, and non-prescription vitamins. This includes your medication: diclofenac (VOLTAREN) tablet                      Do NOT Smoke (Tobacco/Vaping) for 24 hours prior to your procedure.  If you use a CPAP at night, you may bring your mask/headgear for your overnight stay.   You will be asked to remove any contacts, glasses, piercing's, hearing aid's, dentures/partials prior to surgery. Please bring cases for these items if needed.    Patients discharged the day of surgery will not be allowed to drive home, and someone needs to stay with them for 24 hours.  SURGICAL WAITING ROOM VISITATION Patients may have no more than 2  support people in the waiting area - these visitors may rotate.   Pre-op nurse will coordinate an appropriate time for 1 ADULT support person, who may not rotate, to accompany patient in pre-op.  Children under the age of 66 must have an adult with them who is not the patient and must remain in the main waiting area with an adult.  If the patient needs to stay at the hospital during part of their recovery, the visitor guidelines for inpatient rooms apply.  Please refer to the [facility] website for the visitor guidelines for any additional information.   If you received a COVID test during your pre-op visit  it is requested that you wear a mask when out in public, stay away from anyone that may not be feeling well and notify your surgeon if you develop symptoms. If you have been in contact with anyone that has tested positive in the last 10 days please notify you surgeon.      Pre-operative 5 CHG Bathing Instructions   You can play a key role in reducing the risk of infection after surgery. Your skin needs to be as free of germs as possible. You can reduce the number of germs on your skin by washing with CHG (chlorhexidine  gluconate) soap before surgery. CHG is an antiseptic soap that kills germs and continues to kill germs even after washing.   DO NOT use if you have an allergy to chlorhexidine /CHG or antibacterial soaps. If your  skin becomes reddened or irritated, stop using the CHG and notify one of our RNs at [phone].   Please shower with the CHG soap starting 4 days before surgery using the following schedule:     Please keep in mind the following:  DO NOT shave, including legs and underarms, starting the day of your first shower.   You may shave your face at any point before/day of surgery.  Place clean sheets on your bed the day you start using CHG soap. Use a clean washcloth (not used since being washed) for each shower. DO NOT sleep with pets once you start using the CHG.    CHG Shower Instructions:  Wash your face and private area with normal soap. If you choose to wash your hair, wash first with your normal shampoo.  After you use shampoo/soap, rinse your hair and body thoroughly to remove shampoo/soap residue.  Turn the water OFF and apply about 3 tablespoons (45 ml) of CHG soap to a CLEAN washcloth.  Apply CHG soap ONLY FROM YOUR NECK DOWN TO YOUR TOES (washing for 3-5 minutes)  DO NOT use CHG soap on face, private areas, open wounds, or sores.  Pay special attention to the area where your surgery is being performed.  If you are having back surgery, having someone wash your back for you may be helpful. Wait 2 minutes after CHG soap is applied, then you may rinse off the CHG soap.  Pat dry with a clean towel  Put on clean clothes/pajamas   If you choose to wear lotion, please use ONLY the CHG-compatible lotions that are listed below.  Additional instructions for the day of surgery: DO NOT APPLY any lotions, deodorants, cologne, or perfumes.   Do not bring valuables to the hospital. [facility] is not responsible for any belongings/valuables. Do not wear nail polish, gel polish, artificial nails, or any other type of covering on natural nails (fingers and toes) Do not wear jewelry or makeup Put on clean/comfortable clothes.  Please brush your teeth.  Ask your nurse before applying any prescription medications to the skin.     CHG Compatible Lotions   Aveeno Moisturizing lotion  Cetaphil Moisturizing Cream  Cetaphil Moisturizing Lotion  Clairol Herbal Essence Moisturizing Lotion, Dry Skin  Clairol Herbal Essence Moisturizing Lotion, Extra Dry Skin  Clairol Herbal Essence Moisturizing Lotion, Normal Skin  Curel Age Defying Therapeutic Moisturizing Lotion with Alpha Hydroxy  Curel Extreme Care Body Lotion  Curel Soothing Hands Moisturizing Hand Lotion  Curel Therapeutic Moisturizing Cream, Fragrance-Free  Curel Therapeutic Moisturizing Lotion,  Fragrance-Free  Curel Therapeutic Moisturizing Lotion, Original Formula  Eucerin Daily Replenishing Lotion  Eucerin Dry Skin Therapy Plus Alpha Hydroxy Crme  Eucerin Dry Skin Therapy Plus Alpha Hydroxy Lotion  Eucerin Original Crme  Eucerin Original Lotion  Eucerin Plus Crme Eucerin Plus Lotion  Eucerin TriLipid Replenishing Lotion  Keri Anti-Bacterial Hand Lotion  Keri Deep Conditioning Original Lotion Dry Skin Formula Softly Scented  Keri Deep Conditioning Original Lotion, Fragrance Free Sensitive Skin Formula  Keri Lotion Fast Absorbing Fragrance Free Sensitive Skin Formula  Keri Lotion Fast Absorbing Softly Scented Dry Skin Formula  Keri Original Lotion  Keri Skin Renewal Lotion Keri Silky Smooth Lotion  Keri Silky Smooth Sensitive Skin Lotion  Nivea Body Creamy Conditioning Oil  Nivea Body Extra Enriched Teacher, adult education Moisturizing Lotion Nivea Crme  Nivea Skin Firming Lotion  NutraDerm 30 Skin Lotion  NutraDerm Skin Lotion  NutraDerm Therapeutic  Skin Cream  NutraDerm Therapeutic Skin Lotion  ProShield Protective Hand Cream  Provon moisturizing lotion  Please read over the following fact sheets that you were given.

## 2024-03-27 ENCOUNTER — Encounter (HOSPITAL_COMMUNITY): Payer: Self-pay

## 2024-03-27 ENCOUNTER — Other Ambulatory Visit: Payer: Self-pay

## 2024-03-27 ENCOUNTER — Encounter (HOSPITAL_COMMUNITY)
Admission: RE | Admit: 2024-03-27 | Discharge: 2024-03-27 | Disposition: A | Discharge status: Home / Self Care | Source: Ambulatory Visit | Attending: Orthopedic Surgery | Admitting: Orthopedic Surgery

## 2024-03-27 VITALS — BP 133/74 | Temp 98.0°F | Resp 18 | Ht 65.0 in | Wt 193.9 lb

## 2024-03-27 DIAGNOSIS — Q2112 Patent foramen ovale: Secondary | ICD-10-CM | POA: Diagnosis present

## 2024-03-27 DIAGNOSIS — I2510 Atherosclerotic heart disease of native coronary artery without angina pectoris: Secondary | ICD-10-CM | POA: Diagnosis not present

## 2024-03-27 DIAGNOSIS — Z01818 Encounter for other preprocedural examination: Secondary | ICD-10-CM

## 2024-03-27 HISTORY — DX: Essential (primary) hypertension: I10

## 2024-03-27 LAB — BASIC METABOLIC PANEL WITH GFR
Anion gap: 10 (ref 5–15)
BUN: 19 mg/dL (ref 8–23)
CO2: 24 mmol/L (ref 22–32)
Calcium: 9.9 mg/dL (ref 8.9–10.3)
Chloride: 106 mmol/L (ref 98–111)
Creatinine, Ser: 0.92 mg/dL (ref 0.44–1.00)
GFR, Estimated: >60 mL/min (ref >60)
Glucose, Bld: 108 mg/dL — ABNORMAL HIGH (ref 70–99)
Potassium: 4.3 mmol/L (ref 3.5–5.1)
Sodium: 140 mmol/L (ref 135–145)

## 2024-03-27 LAB — TYPE AND SCREEN
ABO/RH(D): A POS
Antibody Screen: NEGATIVE

## 2024-03-27 LAB — CBC
HCT: 45.3 % (ref 36.0–46.0)
Hemoglobin: 14.9 g/dL (ref 12.0–15.0)
MCH: 32.2 pg (ref 26.0–34.0)
MCHC: 32.9 g/dL (ref 30.0–36.0)
MCV: 97.8 fL (ref 80.0–100.0)
Platelets: 250 10*3/uL (ref 150–400)
RBC: 4.63 MIL/uL (ref 3.87–5.11)
RDW: 13 % (ref 11.5–15.5)
WBC: 5.5 10*3/uL (ref 4.0–10.5)
nRBC: 0 % (ref 0.0–0.2)

## 2024-03-27 LAB — SURGICAL PCR SCREEN
MRSA, PCR: NEGATIVE
Staphylococcus aureus: NEGATIVE

## 2024-03-27 NOTE — Progress Notes (Addendum)
 PCP - Was Dr. Edger Goody with [facility], but MD retired. She is still with same practice but seeing Clary Crown, DNP Cardiologist - Was Dr. Avery Bodo, but now Dr. Paulita Boss. Last office visit - 06/25/2023  PPM/ICD - Denies Device Orders - n/a Rep Notified - n/a  Chest x-ray - n/a EKG - 03/27/2024 Stress Test - Denies ECHO - 03/31/2023 Cardiac Cath - 02/06/2013 - no intervention  Sleep Study - Denies CPAP - n/a  No DM  Last dose of GLP1 agonist- n/a GLP1 instructions: n/a  Blood Thinner Instructions: n/a Aspirin  Instructions: Pt states she was instructed to hold ASA for one week. Last dose was May 26th.  ERAS Protcol - Clear liquids until 0430 morning of surgery PRE-SURGERY Ensure or G2- n/a  COVID TEST- n/a   Anesthesia review: Yes. Cardiac Clearance and borderline EKG review. Pt also endorses recent sinus infection 5/22 with symptoms including head pressure and runny nose. She completed her 10 course of Augmentin and denies any respiratory symptoms at pre-op appointment.   Patient denies shortness of breath, fever, cough and chest pain at PAT appointment.   All instructions explained to the patient, with a verbal understanding of the material. Patient agrees to go over the instructions while at home for a better understanding. Patient also instructed to self quarantine after being tested for COVID-19. The opportunity to ask questions was provided.

## 2024-03-28 NOTE — Progress Notes (Signed)
 Anesthesia Chart Review:  73 year old female follows cardiology for history of mild nonobstructive CAD by CTA 02/2023, HLD, HTN.  Echo 03/2023 showed EF 60 to 65%, grade 1 DD, normal RV, small pericardial effusion, no significant valvular abnormalities.  Seen by Charles Connor, NP on 02/04/2024 for preop evaluation.  Per note, "-Patient's RCRI score 0.9% The patient affirms she has been doing well without any new cardiac symptoms. They are able to achieve 7 METS without cardiac limitations. Therefore, based on ACC/AHA guidelines, the patient would be at acceptable risk for the planned procedure without further cardiovascular testing. The patient was advised that if she develops new symptoms prior to surgery to contact our office to arrange for a follow-up visit, and she verbalized understanding. The patient was advised that if she develops new symptoms prior to surgery to contact our office to arrange for a follow-up visit, and she verbalized understanding. Patient may hold Aspirin  for 5-7 days prior to procedure."  Other pertinent history includes severe PONV.  Preop labs reviewed, WNL.  EKG 03/27/2024: Normal sinus rhythm.  Rate 67. Low voltage QRS  TTE 03/31/2023: 1. Left ventricular ejection fraction, by estimation, is 60 to 65%. Left  ventricular ejection fraction by 3D volume is 64 %. The left ventricle has  normal function. The left ventricle has no regional wall motion  abnormalities. Left ventricular diastolic   parameters are consistent with Grade I diastolic dysfunction (impaired  relaxation).   2. Right ventricular systolic function is normal. The right ventricular  size is normal. There is normal pulmonary artery systolic pressure.   3. A small pericardial effusion is present. The pericardial effusion is  anterior to the right ventricle.   4. The mitral valve is normal in structure. Mild mitral valve  regurgitation. No evidence of mitral stenosis.   5. The aortic valve is normal in  structure. Aortic valve regurgitation is  not visualized. No aortic stenosis is present.   6. The inferior vena cava is normal in size with greater than 50%  respiratory variability, suggesting right atrial pressure of 3 mmHg.   7. Agitated saline contrast bubble study was negative, with no evidence  of any interatrial shunt.   Coronary CTA 03/01/2023: IMPRESSION: 1. Normal ascending thoracic aorta 3.1 cm   2.  PFO present   3.  CAD RADS 1 non obstructive CAD see description above   4.  Calcium  score 99 which is 72 nd percentile for age/sex      Edilia Gordon [facility] Short Stay Center/Anesthesiology Phone [phone] 03/28/2024 12:26 PM

## 2024-03-28 NOTE — Anesthesia Preprocedure Evaluation (Addendum)
 Anesthesia Evaluation  Patient identified by MRN, date of birth, ID band Patient awake    Reviewed: Allergy & Precautions, NPO status , Patient's Chart, lab work & pertinent test results  History of Anesthesia Complications (+) PONV and history of anesthetic complications  Airway Mallampati: II  TM Distance: >3 FB Neck ROM: Full    Dental no notable dental hx. (+) Teeth Intact, Dental Advisory Given   Pulmonary neg pulmonary ROS   Pulmonary exam normal breath sounds clear to auscultation       Cardiovascular hypertension, + CAD  Normal cardiovascular exam Rhythm:Regular Rate:Normal   03/2023 TTE 1. Left ventricular ejection fraction, by estimation, is 60 to 65%. Left  ventricular ejection fraction by 3D volume is 64 %. The left ventricle has  normal function. The left ventricle has no regional wall motion  abnormalities. Left ventricular diastolic  parameters are consistent with Grade I diastolic dysfunction (impaired  relaxation).  2. Right ventricular systolic function is normal. The right ventricular  size is normal. There is normal pulmonary artery systolic pressure.  3. A small pericardial effusion is present. The pericardial effusion is  anterior to the right ventricle.  4. The mitral valve is normal in structure. Mild mitral valve  regurgitation. No evidence of mitral stenosis.  5. The aortic valve is normal in structure. Aortic valve regurgitation is  not visualized. No aortic stenosis is present.  6. The inferior vena cava is normal in size with greater than 50%  respiratory variability, suggesting right atrial pressure of 3 mmHg.  7. Agitated saline contrast bubble study was negative, with no evidence  of any interatrial shunt.  Coronary CTA 03/01/2023: IMPRESSION: 1. Normal ascending thoracic aorta 3.1 cm  2.  PFO present  3.  CAD RADS 1 non obstructive CAD see description above  4.  Calcium  score 99  which is 72 nd percentile for age/sex     Neuro/Psych  Neuromuscular disease (leg numbness = bilaterally)    GI/Hepatic   Endo/Other    Renal/GU Lab Results      Component                Value               Date                        K                        4.3                 03/27/2024                   BUN                      19                  03/27/2024                CREATININE               0.92                03/27/2024               GLUCOSE                  108 (H)  03/27/2024                Musculoskeletal  (+) Arthritis ,    Abdominal   Peds  Hematology Lab Results      Component                Value               Date                      WBC                      5.5                 03/27/2024                HGB                      14.9                03/27/2024                HCT                      45.3                03/27/2024                MCV                      97.8                03/27/2024                PLT                      250                 03/27/2024              Anesthesia Other Findings All: atorvastatin, rosuvastatin   Reproductive/Obstetrics                             Anesthesia Physical Anesthesia Plan  ASA: 3  Anesthesia Plan: General   Post-op Pain Management: Ketamine IV*, Ofirmev  IV (intra-op)* and Lidocaine  infusion*   Induction: Intravenous  PONV Risk Score and Plan: Treatment may vary due to age or medical condition, Midazolam , Ondansetron  and Dexamethasone   Airway Management Planned: Oral ETT  Additional Equipment: None  Intra-op Plan:   Post-operative Plan: Extubation in OR  Informed Consent: I have reviewed the patients History and Physical, chart, labs and discussed the procedure including the risks, benefits and alternatives for the proposed anesthesia with the patient or authorized representative who has indicated his/her understanding and acceptance.     Dental  advisory given  Plan Discussed with: CRNA and Surgeon  Anesthesia Plan Comments: (      )        Anesthesia Quick Evaluation

## 2024-04-03 ENCOUNTER — Encounter (HOSPITAL_COMMUNITY): Payer: Self-pay | Admitting: Orthopedic Surgery

## 2024-04-03 ENCOUNTER — Inpatient Hospital Stay (HOSPITAL_COMMUNITY): Payer: Self-pay | Admitting: Anesthesiology

## 2024-04-03 ENCOUNTER — Other Ambulatory Visit: Payer: Self-pay

## 2024-04-03 ENCOUNTER — Inpatient Hospital Stay (HOSPITAL_COMMUNITY): Payer: Self-pay | Admitting: Physician Assistant

## 2024-04-03 ENCOUNTER — Inpatient Hospital Stay (HOSPITAL_COMMUNITY)

## 2024-04-03 ENCOUNTER — Inpatient Hospital Stay (HOSPITAL_COMMUNITY)
Admission: RE | Admit: 2024-04-03 | Discharge: 2024-04-07 | DRG: 427 | Disposition: A | Discharge status: Home / Self Care | Attending: Orthopedic Surgery | Admitting: Orthopedic Surgery

## 2024-04-03 ENCOUNTER — Encounter (HOSPITAL_COMMUNITY)
Admission: RE | Disposition: A | Discharge status: Home / Self Care | Payer: Self-pay | Source: Home / Self Care | Attending: Orthopedic Surgery

## 2024-04-03 DIAGNOSIS — M5116 Intervertebral disc disorders with radiculopathy, lumbar region: Secondary | ICD-10-CM

## 2024-04-03 DIAGNOSIS — M48061 Spinal stenosis, lumbar region without neurogenic claudication: Secondary | ICD-10-CM | POA: Diagnosis present

## 2024-04-03 DIAGNOSIS — M4856XA Collapsed vertebra, not elsewhere classified, lumbar region, initial encounter for fracture: Secondary | ICD-10-CM | POA: Diagnosis present

## 2024-04-03 DIAGNOSIS — I10 Essential (primary) hypertension: Secondary | ICD-10-CM

## 2024-04-03 DIAGNOSIS — M4726 Other spondylosis with radiculopathy, lumbar region: Secondary | ICD-10-CM | POA: Diagnosis present

## 2024-04-03 DIAGNOSIS — M810 Age-related osteoporosis without current pathological fracture: Secondary | ICD-10-CM | POA: Diagnosis present

## 2024-04-03 DIAGNOSIS — G8929 Other chronic pain: Secondary | ICD-10-CM | POA: Diagnosis present

## 2024-04-03 DIAGNOSIS — Z79899 Other long term (current) drug therapy: Secondary | ICD-10-CM | POA: Diagnosis not present

## 2024-04-03 DIAGNOSIS — H40009 Preglaucoma, unspecified, unspecified eye: Secondary | ICD-10-CM | POA: Diagnosis present

## 2024-04-03 DIAGNOSIS — I2510 Atherosclerotic heart disease of native coronary artery without angina pectoris: Secondary | ICD-10-CM | POA: Diagnosis present

## 2024-04-03 DIAGNOSIS — Z8673 Personal history of transient ischemic attack (TIA), and cerebral infarction without residual deficits: Secondary | ICD-10-CM | POA: Diagnosis not present

## 2024-04-03 DIAGNOSIS — Z6831 Body mass index (BMI) 31.0-31.9, adult: Secondary | ICD-10-CM

## 2024-04-03 DIAGNOSIS — E785 Hyperlipidemia, unspecified: Secondary | ICD-10-CM | POA: Diagnosis present

## 2024-04-03 DIAGNOSIS — Z981 Arthrodesis status: Secondary | ICD-10-CM | POA: Diagnosis not present

## 2024-04-03 DIAGNOSIS — M159 Polyosteoarthritis, unspecified: Secondary | ICD-10-CM | POA: Diagnosis present

## 2024-04-03 DIAGNOSIS — M51362 Other intervertebral disc degeneration, lumbar region with discogenic back pain and lower extremity pain: Secondary | ICD-10-CM | POA: Diagnosis not present

## 2024-04-03 DIAGNOSIS — M545 Low back pain, unspecified: Secondary | ICD-10-CM | POA: Diagnosis present

## 2024-04-03 DIAGNOSIS — E669 Obesity, unspecified: Secondary | ICD-10-CM | POA: Diagnosis present

## 2024-04-03 HISTORY — PX: ANTERIOR LAT LUMBAR FUSION: SHX1168

## 2024-04-03 SURGERY — ANTERIOR LATERAL LUMBAR FUSION 2 LEVELS
Anesthesia: General | Site: Spine Lumbar

## 2024-04-03 MED ORDER — BUPIVACAINE-EPINEPHRINE (PF) 0.25% -1:200000 IJ SOLN
INTRAMUSCULAR | Status: AC
  Filled 2024-04-03: qty 30

## 2024-04-03 MED ORDER — ORAL CARE MOUTH RINSE: 15.0000 mL | Freq: Once | OROMUCOSAL | Status: AC

## 2024-04-03 MED ORDER — LACTATED RINGERS IV SOLN
INTRAVENOUS | Status: DC
Start: 2024-04-03 — End: 2024-04-06

## 2024-04-03 MED ORDER — HYDROMORPHONE HCL 1 MG/ML IJ SOLN: 0.2500 mg | INTRAMUSCULAR | Status: DC | PRN

## 2024-04-03 MED ORDER — MAGNESIUM CITRATE PO SOLN
1.0000 | Freq: Once | ORAL | Status: AC | PRN
  Administered 2024-04-04: 1 via ORAL
  Filled 2024-04-03: qty 296

## 2024-04-03 MED ORDER — EZETIMIBE 10 MG PO TABS
10.0000 mg | ORAL_TABLET | Freq: Every day | ORAL | Status: DC
  Administered 2024-04-05 – 2024-04-07 (×3): 10 mg via ORAL
  Filled 2024-04-03 (×3): qty 1

## 2024-04-03 MED ORDER — DEXAMETHASONE SODIUM PHOSPHATE 10 MG/ML IJ SOLN
INTRAMUSCULAR | Status: DC | PRN
  Administered 2024-04-03: 10 mg via INTRAVENOUS

## 2024-04-03 MED ORDER — LACTATED RINGERS IV SOLN: INTRAVENOUS | Status: DC

## 2024-04-03 MED ORDER — SODIUM CHLORIDE 0.9% FLUSH: 3.0000 mL | INTRAVENOUS | Status: DC | PRN

## 2024-04-03 MED ORDER — AMISULPRIDE (ANTIEMETIC) 5 MG/2ML IV SOLN: 10.0000 mg | Freq: Once | INTRAVENOUS | Status: DC | PRN

## 2024-04-03 MED ORDER — CEFAZOLIN SODIUM-DEXTROSE 1-4 GM/50ML-% IV SOLN
1.0000 g | Freq: Three times a day (TID) | INTRAVENOUS | Status: AC
  Administered 2024-04-03 (×2): 1 g via INTRAVENOUS
  Filled 2024-04-03 (×2): qty 50

## 2024-04-03 MED ORDER — LIDOCAINE 2% (20 MG/ML) 5 ML SYRINGE
INTRAMUSCULAR | Status: DC | PRN
  Administered 2024-04-03: 80 mg via INTRAVENOUS

## 2024-04-03 MED ORDER — TRANEXAMIC ACID-NACL 1000-0.7 MG/100ML-% IV SOLN
1000.0000 mg | INTRAVENOUS | Status: AC
  Administered 2024-04-03: 1000 mg via INTRAVENOUS
  Filled 2024-04-03: qty 100

## 2024-04-03 MED ORDER — KETAMINE HCL 10 MG/ML IJ SOLN
INTRAMUSCULAR | Status: DC | PRN
Start: 2024-04-03 — End: 2024-04-03
  Administered 2024-04-03: 10 mg via INTRAVENOUS
  Administered 2024-04-03: 20 mg via INTRAVENOUS
  Administered 2024-04-03: 10 mg via INTRAVENOUS

## 2024-04-03 MED ORDER — LIDOCAINE 2% (20 MG/ML) 5 ML SYRINGE
INTRAMUSCULAR | Status: AC
  Filled 2024-04-03: qty 5

## 2024-04-03 MED ORDER — ACETAMINOPHEN 10 MG/ML IV SOLN: 1000.0000 mg | Freq: Once | INTRAVENOUS | Status: DC | PRN

## 2024-04-03 MED ORDER — ONDANSETRON HCL 4 MG/2ML IJ SOLN
4.0000 mg | Freq: Four times a day (QID) | INTRAMUSCULAR | Status: DC | PRN
  Filled 2024-04-03: qty 2

## 2024-04-03 MED ORDER — POLYETHYLENE GLYCOL 3350 17 G PO PACK
17.0000 g | PACK | Freq: Every day | ORAL | Status: AC | PRN
Start: 2024-04-03 — End: ?

## 2024-04-03 MED ORDER — METHOCARBAMOL 500 MG PO TABS
500.0000 mg | ORAL_TABLET | Freq: Four times a day (QID) | ORAL | Status: DC | PRN
  Administered 2024-04-03 – 2024-04-07 (×12): 500 mg via ORAL
  Filled 2024-04-03 (×12): qty 1

## 2024-04-03 MED ORDER — SODIUM CHLORIDE 0.9 % IV SOLN: 250.0000 mL | INTRAVENOUS | Status: AC

## 2024-04-03 MED ORDER — EPHEDRINE SULFATE-NACL 50-0.9 MG/10ML-% IV SOSY
PREFILLED_SYRINGE | INTRAVENOUS | Status: DC | PRN
  Administered 2024-04-03 (×2): 5 mg via INTRAVENOUS

## 2024-04-03 MED ORDER — PROPOFOL 500 MG/50ML IV EMUL
INTRAVENOUS | Status: DC | PRN
Start: 2024-04-03 — End: 2024-04-03
  Administered 2024-04-03: 170 mg via INTRAVENOUS

## 2024-04-03 MED ORDER — ALUM & MAG HYDROXIDE-SIMETH 200-200-20 MG/5ML PO SUSP
30.0000 mL | Freq: Four times a day (QID) | ORAL | Status: DC | PRN
  Administered 2024-04-03: 30 mL via ORAL
  Filled 2024-04-03: qty 30

## 2024-04-03 MED ORDER — ACETAMINOPHEN 650 MG RE SUPP: 650.0000 mg | RECTAL | Status: DC | PRN

## 2024-04-03 MED ORDER — OXYCODONE HCL 5 MG/5ML PO SOLN: 5.0000 mg | Freq: Once | ORAL | Status: DC | PRN

## 2024-04-03 MED ORDER — DEXAMETHASONE SODIUM PHOSPHATE 10 MG/ML IJ SOLN
INTRAMUSCULAR | Status: AC
Start: 2024-04-03 — End: ?
  Filled 2024-04-03: qty 1

## 2024-04-03 MED ORDER — MIDAZOLAM HCL 2 MG/2ML IJ SOLN
INTRAMUSCULAR | Status: DC | PRN
  Administered 2024-04-03: 2 mg via INTRAVENOUS

## 2024-04-03 MED ORDER — TRANEXAMIC ACID-NACL 1000-0.7 MG/100ML-% IV SOLN: 1000.0000 mg | INTRAVENOUS | Status: DC

## 2024-04-03 MED ORDER — OXYCODONE HCL 5 MG PO TABS: 5.0000 mg | ORAL_TABLET | Freq: Once | ORAL | Status: DC | PRN

## 2024-04-03 MED ORDER — LATANOPROST 0.005 % OP SOLN
1.0000 [drp] | Freq: Every day | OPHTHALMIC | Status: DC
  Administered 2024-04-03: 1 [drp] via OPHTHALMIC
  Filled 2024-04-03: qty 2.5

## 2024-04-03 MED ORDER — ROCURONIUM BROMIDE 10 MG/ML (PF) SYRINGE
PREFILLED_SYRINGE | INTRAVENOUS | Status: AC
  Filled 2024-04-03: qty 10

## 2024-04-03 MED ORDER — SODIUM CHLORIDE 0.9% FLUSH
3.0000 mL | Freq: Two times a day (BID) | INTRAVENOUS | Status: DC
  Administered 2024-04-03 – 2024-04-06 (×8): 3 mL via INTRAVENOUS

## 2024-04-03 MED ORDER — ONDANSETRON HCL 4 MG/2ML IJ SOLN: 4.0000 mg | Freq: Once | INTRAMUSCULAR | Status: DC | PRN

## 2024-04-03 MED ORDER — FENTANYL CITRATE (PF) 250 MCG/5ML IJ SOLN
INTRAMUSCULAR | Status: DC | PRN
  Administered 2024-04-03: 50 ug via INTRAVENOUS
  Administered 2024-04-03: 25 ug via INTRAVENOUS
  Administered 2024-04-03: 100 ug via INTRAVENOUS

## 2024-04-03 MED ORDER — PROPOFOL 10 MG/ML IV BOLUS
INTRAVENOUS | Status: DC | PRN
  Administered 2024-04-03: 120 ug/kg/min via INTRAVENOUS

## 2024-04-03 MED ORDER — PHENOL 1.4 % MT LIQD: 1.0000 | OROMUCOSAL | Status: DC | PRN

## 2024-04-03 MED ORDER — HYDROMORPHONE HCL 1 MG/ML IJ SOLN
0.5000 mg | INTRAMUSCULAR | Status: DC | PRN
  Administered 2024-04-03 – 2024-04-05 (×2): 0.5 mg via INTRAVENOUS
  Filled 2024-04-03 (×2): qty 0.5

## 2024-04-03 MED ORDER — THROMBIN 20000 UNITS EX SOLR: CUTANEOUS | Status: DC | PRN

## 2024-04-03 MED ORDER — HYDROMORPHONE HCL 1 MG/ML IJ SOLN
INTRAMUSCULAR | Status: AC
Start: 2024-04-03 — End: ?
  Filled 2024-04-03: qty 1

## 2024-04-03 MED ORDER — SUCCINYLCHOLINE CHLORIDE 200 MG/10ML IV SOSY
PREFILLED_SYRINGE | INTRAVENOUS | Status: DC | PRN
  Administered 2024-04-03: 90 mg via INTRAVENOUS

## 2024-04-03 MED ORDER — MIDAZOLAM HCL 2 MG/2ML IJ SOLN
INTRAMUSCULAR | Status: AC
  Filled 2024-04-03: qty 2

## 2024-04-03 MED ORDER — LIDOCAINE IN D5W 4-5 MG/ML-% IV SOLN
INTRAVENOUS | Status: DC | PRN
  Administered 2024-04-03: 1 mg/min via INTRAVENOUS

## 2024-04-03 MED ORDER — METHOCARBAMOL 1000 MG/10ML IJ SOLN: 500.0000 mg | Freq: Four times a day (QID) | INTRAMUSCULAR | Status: DC | PRN

## 2024-04-03 MED ORDER — OXYCODONE HCL 5 MG PO TABS
10.0000 mg | ORAL_TABLET | ORAL | Status: DC | PRN
  Administered 2024-04-03 – 2024-04-05 (×11): 10 mg via ORAL
  Filled 2024-04-03 (×12): qty 2

## 2024-04-03 MED ORDER — HYDROMORPHONE HCL 1 MG/ML IJ SOLN
0.2500 mg | INTRAMUSCULAR | Status: DC | PRN
  Administered 2024-04-03 (×3): 0.25 mg via INTRAVENOUS
  Administered 2024-04-03 (×2): 0.5 mg via INTRAVENOUS
  Administered 2024-04-03: 0.25 mg via INTRAVENOUS

## 2024-04-03 MED ORDER — CEFAZOLIN SODIUM-DEXTROSE 2-4 GM/100ML-% IV SOLN
2.0000 g | INTRAVENOUS | Status: AC
  Administered 2024-04-03: 2 g via INTRAVENOUS
  Filled 2024-04-03: qty 100

## 2024-04-03 MED ORDER — ONDANSETRON HCL 4 MG PO TABS: 4.0000 mg | ORAL_TABLET | Freq: Four times a day (QID) | ORAL | Status: DC | PRN

## 2024-04-03 MED ORDER — BUPIVACAINE-EPINEPHRINE 0.25% -1:200000 IJ SOLN
INTRAMUSCULAR | Status: DC | PRN
  Administered 2024-04-03: 10 mL

## 2024-04-03 MED ORDER — ROPIVACAINE HCL 5 MG/ML IJ SOLN
INTRAMUSCULAR | Status: DC | PRN
Start: 2024-04-03 — End: 2024-04-03
  Administered 2024-04-03: 30 mL

## 2024-04-03 MED ORDER — THROMBIN 20000 UNITS EX SOLR
CUTANEOUS | Status: AC
Start: 2024-04-03 — End: ?
  Filled 2024-04-03: qty 20000

## 2024-04-03 MED ORDER — ONDANSETRON HCL 4 MG/2ML IJ SOLN
INTRAMUSCULAR | Status: AC
  Filled 2024-04-03: qty 2

## 2024-04-03 MED ORDER — SURGIFLO WITH THROMBIN (HEMOSTATIC MATRIX KIT) OPTIME
TOPICAL | Status: DC | PRN
  Administered 2024-04-03: 1 via TOPICAL

## 2024-04-03 MED ORDER — PROPOFOL 10 MG/ML IV BOLUS
INTRAVENOUS | Status: AC
  Filled 2024-04-03: qty 20

## 2024-04-03 MED ORDER — MENTHOL 3 MG MT LOZG: 1.0000 | LOZENGE | OROMUCOSAL | Status: DC | PRN

## 2024-04-03 MED ORDER — OXYCODONE HCL 5 MG PO TABS
5.0000 mg | ORAL_TABLET | ORAL | Status: DC | PRN
  Administered 2024-04-04: 5 mg via ORAL
  Filled 2024-04-03: qty 1

## 2024-04-03 MED ORDER — CHLORHEXIDINE GLUCONATE 0.12 % MT SOLN
15.0000 mL | Freq: Once | OROMUCOSAL | Status: AC
  Administered 2024-04-03: 15 mL via OROMUCOSAL
  Filled 2024-04-03: qty 15

## 2024-04-03 MED ORDER — CEFAZOLIN SODIUM-DEXTROSE 2-4 GM/100ML-% IV SOLN: 2.0000 g | INTRAVENOUS | Status: DC

## 2024-04-03 MED ORDER — FENTANYL CITRATE (PF) 250 MCG/5ML IJ SOLN
INTRAMUSCULAR | Status: AC
  Filled 2024-04-03: qty 5

## 2024-04-03 MED ORDER — ACETAMINOPHEN 10 MG/ML IV SOLN
INTRAVENOUS | Status: DC | PRN
  Administered 2024-04-03: 1000 mg via INTRAVENOUS

## 2024-04-03 MED ORDER — SODIUM CHLORIDE 0.9 % IV SOLN: INTRAVENOUS | Status: DC | PRN

## 2024-04-03 MED ORDER — ONDANSETRON HCL 4 MG/2ML IJ SOLN
INTRAMUSCULAR | Status: DC | PRN
  Administered 2024-04-03: 4 mg via INTRAVENOUS

## 2024-04-03 MED ORDER — 0.9 % SODIUM CHLORIDE (POUR BTL) OPTIME
TOPICAL | Status: DC | PRN
  Administered 2024-04-03: 1000 mL

## 2024-04-03 MED ORDER — PHENYLEPHRINE HCL-NACL 20-0.9 MG/250ML-% IV SOLN
INTRAVENOUS | Status: DC | PRN
  Administered 2024-04-03: 60 ug/min via INTRAVENOUS

## 2024-04-03 MED ORDER — ACETAMINOPHEN 325 MG PO TABS: 650.0000 mg | ORAL_TABLET | ORAL | Status: DC | PRN

## 2024-04-03 MED ORDER — KETAMINE HCL 50 MG/5ML IJ SOSY
PREFILLED_SYRINGE | INTRAMUSCULAR | Status: AC
Start: 2024-04-03 — End: ?
  Filled 2024-04-03: qty 5

## 2024-04-03 MED ORDER — SODIUM CHLORIDE 0.9 % IV SOLN
0.1500 ug/kg/min | Freq: Once | INTRAVENOUS | Status: AC
  Administered 2024-04-03: .05 ug/kg/min via INTRAVENOUS
  Filled 2024-04-03: qty 2000

## 2024-04-03 MED ORDER — LISINOPRIL 20 MG PO TABS
20.0000 mg | ORAL_TABLET | Freq: Every day | ORAL | Status: DC
  Administered 2024-04-03 – 2024-04-07 (×4): 20 mg via ORAL
  Filled 2024-04-03 (×4): qty 1

## 2024-04-03 MED ORDER — DORZOLAMIDE HCL-TIMOLOL MAL 2-0.5 % OP SOLN
1.0000 [drp] | Freq: Two times a day (BID) | OPHTHALMIC | Status: DC
  Administered 2024-04-03 – 2024-04-05 (×3): 1 [drp] via OPHTHALMIC
  Filled 2024-04-03: qty 10

## 2024-04-03 MED ORDER — CLONIDINE HCL (ANALGESIA) 100 MCG/ML EP SOLN
EPIDURAL | Status: DC | PRN
  Administered 2024-04-03: 100 ug

## 2024-04-03 SURGICAL SUPPLY — 55 items
ALLOGRFT BNE OSSIFUSE FBR 10CC (Bone Implant) IMPLANT
BAG COUNTER SPONGE SURGICOUNT (BAG) ×2 IMPLANT
BLADE CLIPPER SURG (BLADE) IMPLANT
BLADE SURG 10 STRL SS (BLADE) ×2 IMPLANT
CATH FOLEY 2WAY SLVR 30CC 16FR (CATHETERS) ×2 IMPLANT
CLSR STERI-STRIP ANTIMIC 1/2X4 (GAUZE/BANDAGES/DRESSINGS) ×2 IMPLANT
COVER SURGICAL LIGHT HANDLE (MISCELLANEOUS) ×2 IMPLANT
DRAPE C-ARM 42X72 X-RAY (DRAPES) ×4 IMPLANT
DRAPE C-ARMOR (DRAPES) ×2 IMPLANT
DRAPE INCISE IOBAN 66X45 STRL (DRAPES) IMPLANT
DRAPE U-SHAPE 47X51 STRL (DRAPES) ×4 IMPLANT
DRSG OPSITE POSTOP 4X6 (GAUZE/BANDAGES/DRESSINGS) ×2 IMPLANT
DURAPREP 26ML APPLICATOR (WOUND CARE) ×2 IMPLANT
ELECT NVM5 SURFACE MEP/EMG (ELECTRODE) IMPLANT
ELECT PENCIL ROCKER SW 15FT (MISCELLANEOUS) ×2 IMPLANT
ELECTRODE BLDE 4.0 EZ CLN MEGD (MISCELLANEOUS) ×2 IMPLANT
ELECTRODE REM PT RTRN 9FT ADLT (ELECTROSURGICAL) ×2 IMPLANT
GLOVE BIO SURGEON STRL SZ 6.5 (GLOVE) ×2 IMPLANT
GLOVE BIOGEL PI IND STRL 6.5 (GLOVE) ×2 IMPLANT
GLOVE BIOGEL PI IND STRL 8.5 (GLOVE) ×2 IMPLANT
GLOVE SS BIOGEL STRL SZ 8.5 (GLOVE) ×2 IMPLANT
GOWN STRL REUS W/ TWL LRG LVL3 (GOWN DISPOSABLE) ×2 IMPLANT
GOWN STRL REUS W/ TWL XL LVL3 (GOWN DISPOSABLE) IMPLANT
GOWN STRL REUS W/TWL 2XL LVL3 (GOWN DISPOSABLE) ×4 IMPLANT
KIT BASIN OR (CUSTOM PROCEDURE TRAY) ×2 IMPLANT
KIT DILATOR XLIF 5 (KITS) IMPLANT
KIT SURGICAL ACCESS MAXCESS 4 (KITS) IMPLANT
KIT TURNOVER KIT B (KITS) ×2 IMPLANT
MODULE EMG NDL SSEP NVM5 (NEUROSURGERY SUPPLIES) IMPLANT
MODULE EMG NEEDLE SSEP NVM5 (NEUROSURGERY SUPPLIES) ×1 IMPLANT
NDL 22X1.5 STRL (OR ONLY) (MISCELLANEOUS) ×2 IMPLANT
NDL SPNL 18GX3.5 QUINCKE PK (NEEDLE) ×2 IMPLANT
NEEDLE 22X1.5 STRL (OR ONLY) (MISCELLANEOUS) ×1 IMPLANT
NEEDLE SPNL 18GX3.5 QUINCKE PK (NEEDLE) ×1 IMPLANT
NS IRRIG 1000ML POUR BTL (IV SOLUTION) ×2 IMPLANT
PACK LAMINECTOMY ORTHO (CUSTOM PROCEDURE TRAY) ×2 IMPLANT
PACK UNIVERSAL I (CUSTOM PROCEDURE TRAY) ×2 IMPLANT
PAD ARMBOARD POSITIONER FOAM (MISCELLANEOUS) ×4 IMPLANT
PUTTY DBM INSTAFILL CART 5CC (Putty) IMPLANT
SPACER SPINE RISE-L 18X45 7-14 (Spacer) IMPLANT
SPONGE SURGIFOAM ABS GEL 100 (HEMOSTASIS) IMPLANT
SPONGE T-LAP 4X18 ~~LOC~~+RFID (SPONGE) ×2 IMPLANT
STAPLER SKIN PROX 35W (STAPLE) ×2 IMPLANT
SURGIFLO W/THROMBIN 8M KIT (HEMOSTASIS) IMPLANT
SUT BONE WAX W31G (SUTURE) IMPLANT
SUT MNCRL AB 3-0 PS2 27 (SUTURE) ×2 IMPLANT
SUT STRATAFIX 1PDS 45CM VIOLET (SUTURE) IMPLANT
SUT VIC AB 1 CT1 18XCR BRD 8 (SUTURE) ×4 IMPLANT
SUT VIC AB 2-0 CT1 18 (SUTURE) ×2 IMPLANT
SYR BULB IRRIG 60ML STRL (SYRINGE) ×2 IMPLANT
TAPE CLOTH 4X10 WHT NS (GAUZE/BANDAGES/DRESSINGS) ×2 IMPLANT
TIP CONICAL INSTAFILL (ORTHOPEDIC DISPOSABLE SUPPLIES) IMPLANT
TOWEL GREEN STERILE (TOWEL DISPOSABLE) ×2 IMPLANT
TOWEL GREEN STERILE FF (TOWEL DISPOSABLE) ×2 IMPLANT
WATER STERILE IRR 1000ML POUR (IV SOLUTION) IMPLANT

## 2024-04-03 NOTE — Brief Op Note (Signed)
 04/03/2024  10:15 AM  PATIENT:  Carol Cox  73 y.o. 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

## 2024-04-03 NOTE — Anesthesia Postprocedure Evaluation (Signed)
 Anesthesia Post Note  Patient: TAMMARA MASSING  Procedure(s) Performed: EXTREME LATERAL INTERBODY FUSION LUMBAR TWO - THREE TO LUMBAR THREE - FOUR (Spine Lumbar)     Patient location during evaluation: PACU Anesthesia Type: General Level of consciousness: awake and alert Pain management: pain level controlled Vital Signs Assessment: post-procedure vital signs reviewed and stable Respiratory status: spontaneous breathing, nonlabored ventilation, respiratory function stable and patient connected to nasal cannula oxygen Cardiovascular status: blood pressure returned to baseline and stable Postop Assessment: no apparent nausea or vomiting Anesthetic complications: no  There were no known notable events for this encounter.  Last Vitals:  Vitals:   04/03/24 1130 04/03/24 1145  BP: 139/81 (!) 150/71  Pulse: 77   Resp: 12   Temp:    SpO2: 95%     Last Pain:  Vitals:   04/03/24 1157  TempSrc:   PainSc: 8                  Rosalita Combe

## 2024-04-03 NOTE — Op Note (Signed)
 OPERATIVE REPORT  DATE OF SURGERY: 04/03/2024  PATIENT NAME:  Carol Cox MRN: [id number] DOB: September 16, 1951  PCP: Tura Gaines, MD  PRE-OPERATIVE DIAGNOSIS: Adjacent segment degenerative disc disease L2-3 and L3-4 with back buttock and radicular leg pain.  Prior L4-S1 anterior/posterior spinal fusion  POST-OPERATIVE DIAGNOSIS: Same  PROCEDURE:   XLIF L2-3 and L3-4  SURGEON:  Mort Ards, MD  PHYSICIAN ASSISTANT: Letty Raya, PA  ANESTHESIA:   General  EBL: 20 ml   Complications: None  Implants: Globus expandable Rise-L cage.  7 x 18 x 45 - 13 degrees lordotic cage used at both levels.  L3-4: Cage expanded to final height of 13 anteriorly.  L2/3: Cage expanded to 13 mm height anteriorly.  Graft: ossifuse allograft bone graft  Neuromonitoring: No adverse free running EMG or SSEP activity noted throughout the case.  BRIEF HISTORY: Carol Cox is a 73 y.o. female who had a prior two-level lumbar fusion approximately 4 years ago.  She unfortunately went on to develop adjacent segment degenerative disease.  Despite appropriate conservative management her quality of life continued to deteriorate.  Imaging showed collapse of the L2-3 and L3-4 disc spaces with foraminal stenosis.  As a result of the failure of conservative management we elected to move forward with surgery.  The decision was made to do this as a staged procedure with the first stage being the interbody fixation and the second the posterior instrumentation and possible decompression.  All appropriate risks, benefits, alternatives were discussed and consent was obtained.  PROCEDURE DETAILS: Patient was brought into the operating room and was properly positioned on the operating room table.  After induction with general anesthesia the patient was endotracheally intubated.  A timeout was taken to confirm all important data: including patient, procedure, and the level. Teds, SCD's were applied.   Foley was placed by the  nurse and the needles were placed for intraoperative SSEP and frequent EMG monitoring.  The patient was then placed supine on the operative table.  Patient was then turned into the left lateral decubitus position (left side up).  Axillary roll was placed and all bony prominences were well-padded.  The left arm was placed into the arm holder.  Using fluoroscopy I identified the L2-3 and L3-4 levels.  I position the patient so that the endplates were perpendicular to the floor in the AP view.  This was confirmed by making sure that the spinous process was centered between the pedicles.  I then secured the patient to the table with tape so that she could not rotate or move during the procedure.  Once the patient was secured I confirmed again with AP and lateral imaging that had satisfactory visualization of 2 levels.  I then marked out my incision to be centered over the L3 vertebral body.  I marked the anterior and posterior longitudinal ligaments in the midportion of the L3 vertebral body.  At this point the flank was prepped and draped in a standard fashion.  The incision site was infiltrated with core percent Marcaine  with epinephrine  and a transverse incision was made spanning the width of the L3 vertebral body.  Sharp dissection was carried out down to the external oblique.  I then bluntly dissected through the external and internal oblique and then bluntly entered into the retroperitoneal space.  Using blunt finger dissection I mobilized the adipose tissue in all directions.  I could now palpate the surface of the psoas muscle.  The first dilator was then inserted using  my finger tip to guide it down.  Once I was over the L3-4 disc space I stimulated the psoas muscle to confirm I was not traumatizing the plexus.  I entered into the plexus just anterior to the midportion of the disc space.  Once I was engaged annulus I placed my guidepin through the first dilator to secure it to the disc space.  I then  stimulated in a circumferential manner confirming I was not traumatizing or compressing the plexus.  I then sequentially dilated and with each new dilator stimulated in a circumferential fashion.  Once the final dilator was placed I then inserted the retractor and removed the dilators.  I could now visualize the annulus.  I gently mobilized the retractor posteriorly keeping a cuff of muscle between the posterior blade and the plexus.  Once I had mobilized into the posterior third of the disc space I locked it in place.  I confirmed with fluoroscopy satisfactory visualization of the annulus as well as positioning of my retractor I then stimulated the posterior blade and using my stimulating wand behind each of the other blades to confirm I was not traumatizing the plexus.  The shim was then advanced through the posterior blade and into the disc space.  I then properly positioned my retractor so I had excellent exposure of the L3-4 disc space.   I performed an annulotomy with a 10 blade scalpel and then placed my size 18 mm Cobb elevator and ran it across the L4 superior endplate to the contralateral annulus.  I then released the contralateral annulus.  I remove the Cobb and placed it on the inferior L3 endplate and went across the disc space and released the contralateral annulus.  The box osteotome was then used to remove the bulk of the disc material.  The large lateral exostosis was then removed with a pituitary rongeurs.  I then placed the 8 mm trial parallel implant and I was able to expand the disc space.  I then used a ring curette to remove the disc material and expose the bleeding subchondral bone.  I made sure that I was all the way across the disc space using AP fluoroscopy.  I then rasped the endplates and then irrigated the wound copiously with normal saline.  I then placed the trial expandable cage in order to determine the height of the cage of the put in.  I was able to open up the disc space and  restore normal disc height.  I then obtained the 7 x 18 x 45 mm lordotic cage (13 degrees of lordosis) impacted with the allograft bone.  I then gently inserted across the disc space.  Once I confirmed satisfactory position I expanded to a height of 11.75 mm.  I then backfilled the cage with additional bone graft.  I then checked my x-rays to confirm satisfactory positioning of the cage in both planes.  I then removed my retractor shims and the retractor.  I then gave the psoas approximately 5  minutes of rest before moving forward to the L2-3 level.  Using the same technique that I had used at L3-4 I used the same incision site and advanced my first dilator down to the lateral aspect of the L to 3 level.  I stimulated the psoas and then advanced through the psoas docking at the midportion of the disc space.  I then placed my guidepin to secure this in place.  I then circumferentially dilated and stimulated with each  increasing dilator to confirm I was not traumatizing the plexus.  The working retractor was then placed and secured to the table with the arm.  I then removed the dilators and gently mobilized my retractor posteriorly until I was in the posterior third of the disc space.  I then confirmed with AP and lateral fluoroscopy that my retractor was properly seated.  I then stimulated the posterior blade and then used the stimulator 1 behind each of the blades to confirm that I was not traumatizing the plexus.  The posterior shim was then secured and I pushed the blades forward to expose the entire lateral aspect of the disc space.  Imaging confirmed satisfactory visualization of the disc as well as positioning of the retractor.  I performed an annulotomy with a 10 blade scalpel and then used the Cobb elevators across each of the endplates to release the contralateral annulus.  Box osteotome was used to remove the bulk of the disc material and then I used the ring curettes to remove the remainder of disc  material.  I then used the trialing implants to expand the disc space.  I then read rasped the endplates until I had bleeding subchondral bone.  The wound was irrigated and I placed the trial expander in and then elected to use the same size graft that I used at L3-4.  The implant was packed with allograft and then gently inserted to the appropriate depth.  I expanded the device to a final height of 13 mm.  It had excellent contact with the endplates.  I then backfilled the cage with additional allograft.  I then took x-rays confirming satisfactory positioning of the cage in both planes.  The posterior shim was removed and then I repositioned my retractor so that I could visualize the L3-4 implant.  Once I had the L3-4 implant exposed I then expanded this cage again to a final height of 13 mm.  At this point x-rays demonstrated satisfactory positioning of both lordotic cages.  I had excellent restoration of lumbar lordosis.  The cages were contacting both endplates.  I backfilled both cages until they were full with the allograft.  I then remove my retractor.  Prior to removing the retractor I did place Floseal in the psoas and evaluate the psoas for any bleeding as I was removed the retractor.  Final x-rays were taken and read by me to confirm there was no retained surgical instruments in the wound.  The fascia of the external oblique was then closed with a #1 strata fix.  Superficial 2-0 Vicryl suture and finally a 3-0 Monocryl for the skin.  Steri-Strips and dry dressings were applied and the patient was ultimately extubated and transferred the PACU without incident.  The end of the case all needle and sponge counts were correct.  There were no adverse intraoperative events.  First assistant was Missy Amos my PA who was instrumental in assisting with retraction visualization wound closure and positioning of the patient.  Mort Ards, MD 04/03/2024 9:56 AM

## 2024-04-03 NOTE — H&P (Signed)
 History:  The patient is a 73 year old female who presents for pre-operative visit in preparation for their XLIF L2-4, PSFI decompression L2-4, which is scheduled on 04/03/24 with Dr. Mort Ards, MD at [facility]. The patient has had symptoms in the Back including pain which has impacted their quality of life and ability to do activities of daily living. The patient currently has a diagnosis of lumbar radiculopathy and has failed conservative treatments including activity modification.  Prior Surgery: L4-S1 ant/post lumbar fusion  Past Medical History:  Diagnosis Date   Allergy    seasonal   Arthritis    "knee, left hip, ankles"    Borderline glaucoma    CAD (coronary artery disease)    Mild nonobstructive CAD by cath 02/06/13 (25% mid LAD), normal EF   Chronic low back pain    Colitis    Coronary arteriosclerosis    DDD (degenerative disc disease), lumbar    Degeneration of intervertebral disc of lumbar region    Family history of adverse reaction to anesthesia    mother has problems with nausea and vomiting    Glaucoma    History of colitis    02/ 2016  acute infectious colitis-- resolved   History of total knee arthroplasty    Hyperglycemia    Hyperlipidemia    Hypertension    Left ovarian cyst    Low back pain    with bilateral hip radiation    Lumbar radiculopathy    Lumbar spondylolysis    Obesity    Osteoporosis    ospenia of hips   Pain in joint of left shoulder    Pain of right hip joint    PONV (postoperative nausea and vomiting)    SEVERE    Allergies  Allergen Reactions   Atorvastatin Other (See Comments)    Leg pain   Rosuvastatin  Other (See Comments)    Leg pain    No current facility-administered medications on file prior to encounter.   Current Outpatient Medications on File Prior to Encounter  Medication Sig Dispense Refill   aspirin  EC 81 MG tablet Take 81 mg by mouth daily.     Calcium  200 MG TABS Take 200 mg by mouth daily.      Cholecalciferol (VITAMIN D3) 50 MCG (2000 UT) capsule Take 2,000 Units by mouth daily.     dorzolamide -timolol  (COSOPT ) 22.3-6.8 MG/ML ophthalmic solution Place 1 drop into both eyes in the morning and at bedtime.     Evolocumab  (REPATHA  SURECLICK) 140 MG/ML SOAJ INJECT 1 PEN INTO THE SKIN EVERY 14 (FOURTEEN) DAYS. 6 mL 3   ezetimibe  (ZETIA ) 10 MG tablet TAKE 1 TABLET BY MOUTH EVERY DAY 90 tablet 1   lisinopril  (ZESTRIL ) 20 MG tablet TAKE 1 TABLET BY MOUTH EVERY DAY 90 tablet 1   Omega-3 Fatty Acids (FISH OIL) 1000 MG CAPS Take 2,000 mg by mouth daily.     Pediatric Multivitamins-Fl (MULTIVITAMIN + FLUORIDE) 0.25 MG CHEW Chew 1 tablet by mouth daily.     psyllium (METAMUCIL) 58.6 % powder Take 1 packet by mouth as needed (constipation).     Travoprost, BAK Free, (TRAVATAN) 0.004 % SOLN ophthalmic solution Place 1 drop into both eyes at bedtime.      meloxicam  (MOBIC ) 7.5 MG tablet Take 1 tablet (7.5 mg total) by mouth daily. (Patient not taking: Reported on 07/08/2023) 20 tablet 0    Physical Exam: Vitals:   04/03/24 0541  BP: (!) 145/75  Pulse: 69  Resp: 17  Temp: 97.7 F (36.5 C)  SpO2: 98%   Body mass index is 31.62 kg/m. General: AAOX3, well developed and well nourished, NAD Ambulation Normal uses no assitive devices Heart: RRR. Lungs: Normal pulmonary effort, no signs of respiratory distress Abdomen: Normal bowel soundsX4, soft, non-tender, no hepatosplenomegaly.  Skin: No abnormal lesions, abrasions or contusions. MSK: No obvious bony abnormalities, tenderness to palpation AROM Lumbar Spine: -Spine: normal ROM, pain elicited with fwd flexion and extension  - Knee: flexion and extension normal and pain free bilaterally.  - Ankle: Dorsiflexion, plantarflexion, inversion, eversion normal and pain free.   Dermatomes:Lower extremity sensation to light touch intact bilaterally with positive dysathesias on the bilaterally with the left being worse than the right. Her  dysesthesia are in the L2 and L3 dermatome distribution  Myotomes:   - L2: Left 5/5, Right 5/5  -L3: Left 5/5, Right 5/5  - L4: Left 5/5, Right 5/5  - L5: Left 5/5, Right 5/5  - S1: Left 5/5, Right 5/5; she does have a positive left foot drop when toe walking     Reflexes:   - Patella: Left2+, Right 2+   Achilles: Left2+, Right 2+  - Babinski: Left Negative, Right Negative  - Clonus: Negative  Special Tests:  - Straight Leg Raise: Left Negative, Right Negative - FABER Test:Negative  PV: Extremities warm and well perfused. Posterior and dorsalis pedis pulse 2+ bilaterally, No pitting Edema, discoloration, calf tenderness, or palpable cords. Homan's negative bilaterally.   X-Ray impression:  3vLumbar: Evidence of L4-S1 fusion; hardware is intact and in place. Bilateral SI joints visualized and show no signs of joint space narrowing bilaterally. Bilateral Hip joints visualized and show no signs of joint space narrowing or other bony abnormalities bilaterally. No evidence of spondylolisthesis. evidence of degenerative disease at L2-L3 and L3-L4 with disc space narrowing. No signs of fractures, bony lesions, or other bony abnormalities.  Lumbar MRI:  no cord signal change. progression of disc and facet disease at L2-L3 and L3-L4 with progression to Modic endplate changes. At L2-L3 there is moderate central canal stenosis with mild biforaminal L2 nerve root abutment. At L3-L4 there is moderate central canal stenosis with near ablatiom of the CSF space within the canal and moderate to severe biforaminal stenosis with biforaminal L2 nerve root abutment   A/P:  Assessment: Othelia is a very pleasant 73 year old female who states that she has not had any improvement in her symptoms. On exam she has ongoing back and radicular leg pain. MRI imaging does show adjacent segment disease at L2-L3 and L3-L4. Based on clinical exam findings and imaging studies we discussed that surgical  intervention is warranted at this time due to her increased pain and loss of quality of life.   The surgical intervention that we would recommend would be a XLIF of L2-L3 and L3-L4 and then on day 2 we would perform a PSFI on L2-L3 and L3-L4 with a possible decompression. I discussed with the patient the options including continued injections which have not yielded significant results at this point. I also discussed with the patient in option of far lateral microdiscectomy to deal with the disc herniation and hopefully reduce the radiculitis I discussed with her the risks of infection and bleeding damage to nerves or blood vessels I discussed with her the risk of recurrent disc herniation the risk of continued back pain. questions were welcomed and answered.   Risks, benefits of surgery were reviewed with the patient.  These include: infection, bleeding, death, stroke, paralysis, ongoing or worse pain, need for additional surgery, injury to the lumbar plexus resulting in hip flexor weakness and difficulty walking without assistive devices. Adjacent segment degenerative disease, need for additional surgery including fusing other levels, leak of spinal fluid, Nonunion, hardware failure, breakage, or mal-position. Deep venous thrombosis (DVT) requiring additional treatment such as filter, and/or medications. Injury to abdominal contents, loss in bowel and bladder control.  Goal of surgery is to reduce (not eliminate) pain, and improve quality of life.  Impression: Lumbar radiculopathy; lumbar spondylosis   Plan: 1. L2-4 fusion. XLIF L2-4. extension of existing L4-S1 posterior pedicle screw construct to L2

## 2024-04-03 NOTE — Anesthesia Procedure Notes (Addendum)
 Anesthesia Regional Block: TAP block   Pre-Anesthetic Checklist: , timeout performed,  Correct Patient, Correct Site, Correct Laterality,  Correct Procedure, Correct Position, site marked,  Risks and benefits discussed,  At surgeon's request and post-op pain management  Laterality: Right  Prep: Maximum Sterile Barrier Precautions used, chloraprep       Needles:   Needle Type: Echogenic Stimulator Needle     Needle Length: 10cm  Needle Gauge: 21     Additional Needles:   Procedures:,,,, ultrasound used (permanent image in chart),,    Narrative:  Start time: 04/03/2024 7:20 AM End time: 04/03/2024 7:26 AM

## 2024-04-03 NOTE — Anesthesia Procedure Notes (Signed)
 Procedure Name: Intubation Date/Time: 04/03/2024 8:51 AM  Performed by: Sherma Diver, CRNAPre-anesthesia Checklist: Patient identified, Patient being monitored, Timeout performed, Emergency Drugs available and Suction available Patient Re-evaluated:Patient Re-evaluated prior to induction Oxygen Delivery Method: Circle system utilized Preoxygenation: Pre-oxygenation with 100% oxygen Induction Type: IV induction Ventilation: Mask ventilation without difficulty Laryngoscope Size: Mac and 3 Grade View: Grade II Tube type: Oral Tube size: 7.0 mm Number of attempts: 1 Airway Equipment and Method: Stylet Placement Confirmation: ETT inserted through vocal cords under direct vision, positive ETCO2 and breath sounds checked- equal and bilateral Secured at: 21 cm Tube secured with: Tape Dental Injury: Teeth and Oropharynx as per pre-operative assessment

## 2024-04-03 NOTE — Plan of Care (Signed)

## 2024-04-03 NOTE — Transfer of Care (Signed)
 Immediate Anesthesia Transfer of Care Note  Patient: Carol Cox  Procedure(s) Performed: EXTREME LATERAL INTERBODY FUSION LUMBAR TWO - THREE TO LUMBAR THREE - FOUR (Spine Lumbar)  Patient Location: PACU  Anesthesia Type:General  Level of Consciousness: drowsy and responds to stimulation  Airway & Oxygen Therapy: Patient Spontanous Breathing and Patient connected to face mask oxygen  Post-op Assessment: Report given to RN and Post -op Vital signs reviewed and stable  Post vital signs: Reviewed and stable  Last Vitals:  Vitals Value Taken Time  BP 89/55 04/03/24 1018  Temp    Pulse 69 04/03/24 1022  Resp 21 04/03/24 1022  SpO2 98 % 04/03/24 1022  Vitals shown include unfiled device data.  Last Pain:  Vitals:   04/03/24 1018  TempSrc:   PainSc: (P) Asleep         Complications: There were no known notable events for this encounter.

## 2024-04-04 ENCOUNTER — Inpatient Hospital Stay (HOSPITAL_COMMUNITY)

## 2024-04-04 ENCOUNTER — Inpatient Hospital Stay (HOSPITAL_COMMUNITY): Admitting: Anesthesiology

## 2024-04-04 ENCOUNTER — Inpatient Hospital Stay (HOSPITAL_COMMUNITY): Admission: RE | Admit: 2024-04-04 | Source: Ambulatory Visit | Admitting: Orthopedic Surgery

## 2024-04-04 ENCOUNTER — Encounter (HOSPITAL_COMMUNITY): Payer: Self-pay | Admitting: Orthopedic Surgery

## 2024-04-04 ENCOUNTER — Encounter (HOSPITAL_COMMUNITY)
Admission: RE | Disposition: A | Discharge status: Home / Self Care | Payer: Self-pay | Source: Ambulatory Visit | Attending: Orthopedic Surgery

## 2024-04-04 ENCOUNTER — Other Ambulatory Visit: Payer: Self-pay

## 2024-04-04 DIAGNOSIS — M51362 Other intervertebral disc degeneration, lumbar region with discogenic back pain and lower extremity pain: Secondary | ICD-10-CM | POA: Diagnosis not present

## 2024-04-04 SURGERY — POSTERIOR LUMBAR FUSION 2 LEVEL
Anesthesia: General

## 2024-04-04 MED ORDER — PROPOFOL 10 MG/ML IV BOLUS
INTRAVENOUS | Status: DC | PRN
  Administered 2024-04-04: 110 mg via INTRAVENOUS

## 2024-04-04 MED ORDER — MIDAZOLAM HCL 2 MG/2ML IJ SOLN
INTRAMUSCULAR | Status: AC
  Filled 2024-04-04: qty 2

## 2024-04-04 MED ORDER — DROPERIDOL 2.5 MG/ML IJ SOLN
INTRAMUSCULAR | Status: DC | PRN
Start: 2024-04-04 — End: 2024-04-04
  Administered 2024-04-04: .625 mg via INTRAVENOUS

## 2024-04-04 MED ORDER — DROPERIDOL 2.5 MG/ML IJ SOLN
INTRAMUSCULAR | Status: AC
  Filled 2024-04-04: qty 2

## 2024-04-04 MED ORDER — ONDANSETRON HCL 4 MG/2ML IJ SOLN
INTRAMUSCULAR | Status: AC
Start: 2024-04-04 — End: ?
  Filled 2024-04-04: qty 2

## 2024-04-04 MED ORDER — CEFAZOLIN SODIUM-DEXTROSE 2-3 GM-%(50ML) IV SOLR
INTRAVENOUS | Status: DC | PRN
Start: 2024-04-04 — End: 2024-04-04
  Administered 2024-04-04: 2 g via INTRAVENOUS

## 2024-04-04 MED ORDER — PROPOFOL 10 MG/ML IV BOLUS
INTRAVENOUS | Status: AC
  Filled 2024-04-04: qty 20

## 2024-04-04 MED ORDER — ONDANSETRON HCL 4 MG PO TABS: 4.0000 mg | ORAL_TABLET | Freq: Three times a day (TID) | ORAL | 0 refills | Status: DC | PRN

## 2024-04-04 MED ORDER — PHENYLEPHRINE 80 MCG/ML (10ML) SYRINGE FOR IV PUSH (FOR BLOOD PRESSURE SUPPORT)
PREFILLED_SYRINGE | INTRAVENOUS | Status: DC | PRN
  Administered 2024-04-04 (×2): 160 ug via INTRAVENOUS

## 2024-04-04 MED ORDER — EPHEDRINE 5 MG/ML INJ
INTRAVENOUS | Status: AC
  Filled 2024-04-04: qty 5

## 2024-04-04 MED ORDER — ALBUMIN HUMAN 5 % IV SOLN
INTRAVENOUS | Status: DC | PRN
Start: 2024-04-04 — End: 2024-04-04

## 2024-04-04 MED ORDER — LIDOCAINE 2% (20 MG/ML) 5 ML SYRINGE
INTRAMUSCULAR | Status: DC | PRN
  Administered 2024-04-04: 60 mg via INTRAVENOUS

## 2024-04-04 MED ORDER — EPHEDRINE SULFATE-NACL 50-0.9 MG/10ML-% IV SOSY
PREFILLED_SYRINGE | INTRAVENOUS | Status: DC | PRN
  Administered 2024-04-04 (×2): 5 mg via INTRAVENOUS

## 2024-04-04 MED ORDER — HYDROMORPHONE HCL 1 MG/ML IJ SOLN: 0.2500 mg | INTRAMUSCULAR | Status: DC | PRN

## 2024-04-04 MED ORDER — FENTANYL CITRATE (PF) 250 MCG/5ML IJ SOLN
INTRAMUSCULAR | Status: DC | PRN
  Administered 2024-04-04: 50 ug via INTRAVENOUS
  Administered 2024-04-04 (×2): 25 ug via INTRAVENOUS

## 2024-04-04 MED ORDER — ACETAMINOPHEN 10 MG/ML IV SOLN
INTRAVENOUS | Status: AC
  Filled 2024-04-04: qty 100

## 2024-04-04 MED ORDER — DEXAMETHASONE SODIUM PHOSPHATE 10 MG/ML IJ SOLN
INTRAMUSCULAR | Status: AC
  Filled 2024-04-04: qty 1

## 2024-04-04 MED ORDER — CEFAZOLIN SODIUM 1 G IJ SOLR
INTRAMUSCULAR | Status: AC
  Filled 2024-04-04: qty 20

## 2024-04-04 MED ORDER — METHOCARBAMOL 500 MG PO TABS: 500.0000 mg | ORAL_TABLET | Freq: Three times a day (TID) | ORAL | 0 refills | Status: AC | PRN

## 2024-04-04 MED ORDER — ORAL CARE MOUTH RINSE: 15.0000 mL | Freq: Once | OROMUCOSAL | Status: AC

## 2024-04-04 MED ORDER — GELATIN ABSORBABLE 100 EX MISC: CUTANEOUS | Status: DC | PRN

## 2024-04-04 MED ORDER — CHLORHEXIDINE GLUCONATE 0.12 % MT SOLN
15.0000 mL | Freq: Once | OROMUCOSAL | Status: AC
Start: 2024-04-04 — End: 2024-04-04
  Administered 2024-04-04: 15 mL via OROMUCOSAL
  Filled 2024-04-04: qty 15

## 2024-04-04 MED ORDER — PROPOFOL 500 MG/50ML IV EMUL
INTRAVENOUS | Status: DC | PRN
  Administered 2024-04-04: 125 ug/kg/min via INTRAVENOUS

## 2024-04-04 MED ORDER — SUCCINYLCHOLINE CHLORIDE 200 MG/10ML IV SOSY
PREFILLED_SYRINGE | INTRAVENOUS | Status: DC | PRN
  Administered 2024-04-04: 100 mg via INTRAVENOUS

## 2024-04-04 MED ORDER — SODIUM CHLORIDE 0.9 % IV SOLN
0.1500 ug/kg/min | INTRAVENOUS | Status: DC
  Administered 2024-04-04: .08 ug/kg/min via INTRAVENOUS
  Filled 2024-04-04: qty 2000

## 2024-04-04 MED ORDER — OXYCODONE-ACETAMINOPHEN 10-325 MG PO TABS: 1.0000 | ORAL_TABLET | Freq: Four times a day (QID) | ORAL | 0 refills | Status: AC | PRN

## 2024-04-04 MED ORDER — FENTANYL CITRATE (PF) 250 MCG/5ML IJ SOLN
INTRAMUSCULAR | Status: AC
  Filled 2024-04-04: qty 5

## 2024-04-04 MED ORDER — THROMBIN 20000 UNITS EX SOLR: CUTANEOUS | Status: DC | PRN

## 2024-04-04 MED ORDER — ACETAMINOPHEN 10 MG/ML IV SOLN
INTRAVENOUS | Status: DC | PRN
  Administered 2024-04-04: 1000 mg via INTRAVENOUS

## 2024-04-04 MED ORDER — ONDANSETRON HCL 4 MG/2ML IJ SOLN
INTRAMUSCULAR | Status: DC | PRN
  Administered 2024-04-04: 4 mg via INTRAVENOUS

## 2024-04-04 MED ORDER — BUPIVACAINE-EPINEPHRINE (PF) 0.25% -1:200000 IJ SOLN
INTRAMUSCULAR | Status: AC
  Filled 2024-04-04: qty 30

## 2024-04-04 MED ORDER — BUPIVACAINE-EPINEPHRINE 0.25% -1:200000 IJ SOLN
INTRAMUSCULAR | Status: DC | PRN
  Administered 2024-04-04: 10 mL

## 2024-04-04 MED ORDER — ROCURONIUM BROMIDE 10 MG/ML (PF) SYRINGE
PREFILLED_SYRINGE | INTRAVENOUS | Status: AC
  Filled 2024-04-04: qty 10

## 2024-04-04 MED ORDER — PROPOFOL 1000 MG/100ML IV EMUL
INTRAVENOUS | Status: AC
  Filled 2024-04-04: qty 100

## 2024-04-04 MED ORDER — HYDROMORPHONE HCL 1 MG/ML IJ SOLN
INTRAMUSCULAR | Status: AC
  Filled 2024-04-04: qty 0.5

## 2024-04-04 MED ORDER — THROMBIN 20000 UNITS EX SOLR
CUTANEOUS | Status: AC
  Filled 2024-04-04: qty 20000

## 2024-04-04 MED ORDER — 0.9 % SODIUM CHLORIDE (POUR BTL) OPTIME
TOPICAL | Status: DC | PRN
  Administered 2024-04-04: 1000 mL

## 2024-04-04 MED ORDER — LIDOCAINE 2% (20 MG/ML) 5 ML SYRINGE
INTRAMUSCULAR | Status: AC
  Filled 2024-04-04: qty 5

## 2024-04-04 MED ORDER — DEXAMETHASONE SODIUM PHOSPHATE 10 MG/ML IJ SOLN
INTRAMUSCULAR | Status: DC | PRN
  Administered 2024-04-04: 10 mg via INTRAVENOUS

## 2024-04-04 MED ORDER — PHENYLEPHRINE HCL-NACL 20-0.9 MG/250ML-% IV SOLN
INTRAVENOUS | Status: DC | PRN
  Administered 2024-04-04: 30 ug/min via INTRAVENOUS

## 2024-04-04 MED ORDER — PHENYLEPHRINE 80 MCG/ML (10ML) SYRINGE FOR IV PUSH (FOR BLOOD PRESSURE SUPPORT)
PREFILLED_SYRINGE | INTRAVENOUS | Status: AC
  Filled 2024-04-04: qty 10

## 2024-04-04 MED ORDER — HYDROMORPHONE HCL 1 MG/ML IJ SOLN
INTRAMUSCULAR | Status: DC | PRN
  Administered 2024-04-04 (×2): .25 mg via INTRAVENOUS

## 2024-04-04 MED ORDER — LACTATED RINGERS IV SOLN: INTRAVENOUS | Status: DC

## 2024-04-04 MED ORDER — TRANEXAMIC ACID-NACL 1000-0.7 MG/100ML-% IV SOLN
INTRAVENOUS | Status: DC | PRN
Start: 2024-04-04 — End: 2024-04-04
  Administered 2024-04-04: 1000 mg via INTRAVENOUS

## 2024-04-04 SURGICAL SUPPLY — 68 items
ALLOGRFT BNE OSSIFUSE FBR 10CC (Bone Implant) IMPLANT
BAG COUNTER SPONGE SURGICOUNT (BAG) ×2 IMPLANT
BLADE CLIPPER SURG (BLADE) IMPLANT
BUR EGG ELITE 4.0 (BURR) ×2 IMPLANT
BUR MATCHSTICK NEURO 3.0X3.8 (BURR) ×2 IMPLANT
BURR SURG PEAR FLUTED 5.0 (BURR) IMPLANT
CABLE BIPOLOR RESECTION CORD (MISCELLANEOUS) ×2 IMPLANT
CAP RELINE MOD TULIP RMM (Cap) IMPLANT
CLSR STERI-STRIP ANTIMIC 1/2X4 (GAUZE/BANDAGES/DRESSINGS) ×2 IMPLANT
COVER MAYO STAND STRL (DRAPES) IMPLANT
COVER SURGICAL LIGHT HANDLE (MISCELLANEOUS) ×2 IMPLANT
DRAPE C-ARM 42X72 X-RAY (DRAPES) ×2 IMPLANT
DRAPE C-ARMOR (DRAPES) ×2 IMPLANT
DRAPE SURG 17X23 STRL (DRAPES) ×2 IMPLANT
DRAPE U-SHAPE 47X51 STRL (DRAPES) ×2 IMPLANT
DRSG OPSITE POSTOP 4X6 (GAUZE/BANDAGES/DRESSINGS) IMPLANT
DRSG OPSITE POSTOP 4X8 (GAUZE/BANDAGES/DRESSINGS) ×2 IMPLANT
DURAPREP 26ML APPLICATOR (WOUND CARE) ×2 IMPLANT
ELECT BLADE 6.5 EXT (BLADE) ×2 IMPLANT
ELECT CAUTERY BLADE 6.4 (BLADE) ×2 IMPLANT
ELECT NVM5 SURFACE MEP/EMG (ELECTRODE) IMPLANT
ELECT PENCIL ROCKER SW 15FT (MISCELLANEOUS) ×2 IMPLANT
ELECTRODE BLDE 4.0 EZ CLN MEGD (MISCELLANEOUS) IMPLANT
ELECTRODE REM PT RTRN 9FT ADLT (ELECTROSURGICAL) ×2 IMPLANT
GLOVE BIOGEL PI IND STRL 7.0 (GLOVE) IMPLANT
GLOVE BIOGEL PI IND STRL 8.5 (GLOVE) ×2 IMPLANT
GLOVE SS BIOGEL STRL SZ 8.5 (GLOVE) ×2 IMPLANT
GLOVE SURG SS PI 6.5 STRL IVOR (GLOVE) IMPLANT
GOWN STRL REUS W/TWL 2XL LVL3 (GOWN DISPOSABLE) ×4 IMPLANT
KIT BASIN OR (CUSTOM PROCEDURE TRAY) ×2 IMPLANT
KIT POSITIONER JACKSON TABLE (MISCELLANEOUS) ×2 IMPLANT
KIT TURNOVER KIT B (KITS) ×2 IMPLANT
MODULE EMG NDL SSEP NVM5 (NEUROSURGERY SUPPLIES) IMPLANT
MODULE EMG NEEDLE SSEP NVM5 (NEUROSURGERY SUPPLIES) ×1 IMPLANT
NDL 22X1.5 STRL (OR ONLY) (MISCELLANEOUS) ×2 IMPLANT
NDL SPNL 18GX3.5 QUINCKE PK (NEEDLE) ×2 IMPLANT
NEEDLE 22X1.5 STRL (OR ONLY) (MISCELLANEOUS) ×1 IMPLANT
NEEDLE SPNL 18GX3.5 QUINCKE PK (NEEDLE) ×1 IMPLANT
NS IRRIG 1000ML POUR BTL (IV SOLUTION) ×2 IMPLANT
PACK LAMINECTOMY ORTHO (CUSTOM PROCEDURE TRAY) ×2 IMPLANT
PACK UNIVERSAL I (CUSTOM PROCEDURE TRAY) ×2 IMPLANT
PAD ARMBOARD POSITIONER FOAM (MISCELLANEOUS) ×4 IMPLANT
PATTIES SURGICAL .5 X.5 (GAUZE/BANDAGES/DRESSINGS) IMPLANT
PATTIES SURGICAL .5 X1 (DISPOSABLE) ×2 IMPLANT
POSITIONER HEAD PRONE TRACH (MISCELLANEOUS) ×2 IMPLANT
PROBE BALL TIP NVM5 SNG USE (NEUROSURGERY SUPPLIES) IMPLANT
ROD LORD RELINE O 5X80 (Rod) IMPLANT
ROD RELINE 0-0 CON M 5.0/6.0MM (Rod) IMPLANT
SCREW LOCK RELINE 5.5 TULIP (Screw) IMPLANT
SCREW LOCK RSS 4.5/5.0MM (Screw) IMPLANT
SCREW SHANK RELINE 6.5X40MM (Screw) IMPLANT
SHANK RELINE MOD 5.5X40 (Screw) IMPLANT
SPONGE SURGIFOAM ABS GEL 100 (HEMOSTASIS) ×2 IMPLANT
SPONGE T-LAP 4X18 ~~LOC~~+RFID (SPONGE) ×4 IMPLANT
STRIP CLOSURE SKIN 1/4X3 (GAUZE/BANDAGES/DRESSINGS) IMPLANT
SURGIFLO W/THROMBIN 8M KIT (HEMOSTASIS) IMPLANT
SUT BONE WAX W31G (SUTURE) ×2 IMPLANT
SUT MNCRL+ AB 3-0 CT1 36 (SUTURE) ×2 IMPLANT
SUT VIC AB 0 CT1 27XBRD ANBCTR (SUTURE) IMPLANT
SUT VIC AB 1 CT1 18XCR BRD 8 (SUTURE) ×2 IMPLANT
SUT VIC AB 2-0 CT1 18 (SUTURE) ×2 IMPLANT
SYR BULB IRRIG 60ML STRL (SYRINGE) ×2 IMPLANT
SYR CONTROL 10ML LL (SYRINGE) ×2 IMPLANT
TOWEL GREEN STERILE (TOWEL DISPOSABLE) ×2 IMPLANT
TOWEL GREEN STERILE FF (TOWEL DISPOSABLE) ×2 IMPLANT
TRAY FOLEY MTR SLVR 16FR STAT (SET/KITS/TRAYS/PACK) ×2 IMPLANT
WATER STERILE IRR 1000ML POUR (IV SOLUTION) ×2 IMPLANT
YANKAUER SUCT BULB TIP NO VENT (SUCTIONS) ×2 IMPLANT

## 2024-04-04 NOTE — Brief Op Note (Signed)
 04/03/2024 - 04/04/2024  3:18 PM  PATIENT:  Carol Cox  73 y.o. 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, prior Lumbar four-sacral one fusion  PROCEDURE:  Procedure(s): POSTERIOR SUPPLEMENTAL FUSION ,INSTRUMENTED LUMBAR TWO-TO LUMBAR THREE,LUMBAR THREE TO LUMBAR FOUR FUSION POSSIBLE LUMBAR TWO TO LUMBAR FOUR DECOMPRESSION (N/A)  SURGEON:  Surgeons and Role:    Mort Ards, MD - Primary  PHYSICIAN ASSISTANT: Letty Raya, PA  ASSISTANTS:    ANESTHESIA:   general  EBL:  30 mL   BLOOD ADMINISTERED:none  DRAINS: none   LOCAL MEDICATIONS USED:  MARCAINE      SPECIMEN:  No Specimen  DISPOSITION OF SPECIMEN:  N/A  COUNTS:  YES  TOURNIQUET:  * No tourniquets in log *  DICTATION: .Dragon Dictation  PLAN OF CARE: Admit to inpatient   PATIENT DISPOSITION:  PACU - hemodynamically stable.

## 2024-04-04 NOTE — Progress Notes (Signed)
 PT Cancellation Note  Patient Details Name: Carol Cox MRN: [id number] DOB: 1951-08-15   Cancelled Treatment:    Reason Eval/Treat Not Completed: Pt scheduled for second surgery today. PT will follow up to initiate evaluation post-op.    [name] 04/04/2024, 10:47 AM  Simone Dubois, PT, DPT Acute [facility] Services Secure Chat Preferred Office: [phone]

## 2024-04-04 NOTE — Transfer of Care (Signed)
 Immediate Anesthesia Transfer of Care Note  Patient: Carol Cox  Procedure(s) Performed: POSTERIOR SUPPLEMENTAL FUSION ,INSTRUMENTED LUMBAR TWO-TO LUMBAR THREE,LUMBAR THREE TO LUMBAR FOUR FUSION POSSIBLE LUMBAR TWO TO LUMBAR FOUR DECOMPRESSION  Patient Location: PACU  Anesthesia Type:General  Level of Consciousness: awake, alert , and oriented  Airway & Oxygen Therapy: Patient Spontanous Breathing and Patient connected to face mask oxygen  Post-op Assessment: Report given to RN and Post -op Vital signs reviewed and stable  Post vital signs: Reviewed and stable  Last Vitals:  Vitals Value Taken Time  BP 127/66 04/04/24 1546  Temp    Pulse 90 04/04/24 1553  Resp 9 04/04/24 1553  SpO2 94 % 04/04/24 1553  Vitals shown include unfiled device data.  Last Pain:  Vitals:   04/04/24 1144  TempSrc: Oral  PainSc: 8       Patients Stated Pain Goal: 3 (04/03/24 1910)  Complications: No notable events documented.

## 2024-04-04 NOTE — Anesthesia Procedure Notes (Signed)
 Procedure Name: Intubation Date/Time: 04/04/2024 12:59 PM  Performed by: Pete Schnitzer J, CRNAPre-anesthesia Checklist: Patient identified, Emergency Drugs available, Suction available and Patient being monitored Patient Re-evaluated:Patient Re-evaluated prior to induction Oxygen Delivery Method: Circle System Utilized Preoxygenation: Pre-oxygenation with 100% oxygen Induction Type: IV induction Ventilation: Mask ventilation without difficulty Laryngoscope Size: Miller and 3 Grade View: Grade III Tube type: Oral Tube size: 7.0 mm Number of attempts: 1 Airway Equipment and Method: Stylet and Oral airway Placement Confirmation: ETT inserted through vocal cords under direct vision, positive ETCO2 and breath sounds checked- equal and bilateral Secured at: 21 cm Tube secured with: Tape Dental Injury: Teeth and Oropharynx as per pre-operative assessment

## 2024-04-04 NOTE — Progress Notes (Signed)
 OT Cancellation Note  Patient Details Name: Carol Cox MRN: [id number] DOB: 02-13-1951   Cancelled Treatment:    Reason Eval/Treat Not Completed: Other (comment) (Pt with plans for second spinal surgery today - OT evaluation to f/u post-op.)  Starla Easterly 04/04/2024, 9:08 AM

## 2024-04-04 NOTE — Discharge Instructions (Signed)

## 2024-04-04 NOTE — Op Note (Signed)
 OPERATIVE REPORT  DATE OF SURGERY: 04/04/2024  PATIENT NAME:  Carol Cox MRN: [id number] DOB: January 11, 1951  PCP: Tura Gaines, MD  PRE-OPERATIVE DIAGNOSIS: Prior L4-S1 anterior posterior instrumented fusion.  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  Status post XLIF L2-4.  POST-OPERATIVE DIAGNOSIS: Same  PROCEDURE:   1: Extension of the posterior instrumented fusion to L2.    A.  Pedicle screw placement L2 and L3  2.  Posterior lateral arthrodesis L2-4  SURGEON:  Mort Ards, MD  PHYSICIAN ASSISTANT: Letty Raya, PA  ANESTHESIA:   General  EBL: 30 ml   Complications: None  Implants: NuVasive cortical pedicle screws.  L2: 5.5 x 40 mm length.  L3: 6.5 x 40 mm length.  Rod to rod connector between L4-5.  80 mm length rod connecting L2 through the rod to rod connector at L4-5.  Neuromonitoring: All 4 cortical pedicle screws were directly stimulated.  Left side: Positive activity 35 mA at L2 and 24 mA at L3.  Right side: Positive activity at greater than 40 mA at L2, and positive activity at 33 mA at L3.  No adverse free running EMG or SSEP activity throughout the case.  Graft: ossifuse, local bone graft from reaming  BRIEF HISTORY: Carol Cox is a 73 y.o. female who has had a previous L4-S1 instrumented fusion.  She developed adjacent segment degenerative disease and persistent back buttock and neuropathic leg pain.  Conservative management failed to alleviate her symptoms or improve her quality of life.  As a result we elected to move forward with a staged 2 level fusion.  Patient underwent the lateral interbody fusion yesterday without complication or incident.  Presents today for the posterior supplemental fixation.  Risks, benefits, alternatives were discussed and consent was obtained.  PROCEDURE DETAILS: Patient was brought into the operating room and was properly positioned on the operating room table.  After induction with general  anesthesia the patient was endotracheally intubated.  A timeout was taken to confirm all important data: including patient, procedure, and the level. Teds, SCD's were applied.   The patient was turned prone onto the [facility] spine frame.  All bony prominences well-padded and the neuromonitoring representative placed all appropriate needles for intraoperative SSEP and EMG monitoring.  The back was then prepped and draped in a standard fashion.  Using fluoroscopy I marked out the L2 and L3 pedicles.  I then identified the rod between the L4 and L5 pedicle screws.  I marked out my midline incision spanning this area and then infiltrated with quarter percent Marcaine  with epinephrine .  A midline incision was made and sharp dissection was carried out down to the deep fascia.  I incised the deep fascia and stripped the paraspinal muscles with a Cobb and Bovie to expose the spinous process of L2 and the inferior portion of the lamina and the L1-2 facet complex bilaterally.  I then exposed the L3 spinous process, lamina, and the L2-3 facet complex.  I then continued inferiorly until I identified the L4 pedicle screw.  I then identified the space between the L4 and L5 pedicle screws and marked out the rod.  At this point once I had the bilateral exposure complete I proceeded with placing the rod to rod connectors.    With the exposure complete I used fluoroscopy to identify the inferior medial border of the pedicle of L2.  I then broached the cortex with a high-speed bur and then used the pedicle awl to  advance into the pedicle aiming towards the superior lateral corner.  I confirmed my trajectory using the AP image.  As I neared the lateral third of the pedicle I switched to the lateral view on the fluoroscopy and I confirmed that I was just at the posterior wall of the vertebral body.  I then advanced into the vertebral body.  I removed the pedicle awl and palpated the hole with a ball-tipped feeler.  Once I confirmed  that it was intact and there was no breach I tapped and then repalpated the hole.  I then obtained the 5.5 x 40 mm length screw and placed it into the pedicle of L2.  This had excellent purchase.  Using the exact same technique I placed the contralateral screw at L2.  I used the same technique at L3.  Once all 4 pedicle screws were properly placed I then directly stimulated them and there was no adverse activity to suggest breach and nerve compromise.  At this point I irrigated the wound copiously with normal saline and then used my high-speed bur to decorticate the L2-3 facet, the L2 pars as well as the L3 pars and the superior portion of the L3-4 facet.  I then placed the polyaxial heads on the screws and obtained an 80 mm length rod.  The rod was placed into the wound and secured to the pedicle screws and the rod to rod connector.  All 3 locking caps were tightened and torqued according manufacture standards.  I then used the allograft bone mixed with the reaming tube placed in the posterior lateral gutter for the fusion.  At this point the retractors were removed and final imaging was taken.  Both the AP and lateral planes demonstrated satisfactory positioning of the implants and the posterior pedicle screw construct.  The fascia was then closed with interrupted #1 Vicryl suture followed by a running 0 Vicryl suture and then interrupted 2-0 Vicryl sutures.  The skin was then closed with a 3-0 Monocryl.  Steri-Strips and a dry dressing were applied and the patient was ultimately extubated and transferred to the PACU without incident.  The end of the case all needle and sponge counts were correct.  Mort Ards, MD 04/04/2024 3:03 PM

## 2024-04-04 NOTE — Anesthesia Preprocedure Evaluation (Addendum)
 Anesthesia Evaluation  Patient identified by MRN, date of birth, ID band Patient awake    Reviewed: Allergy & Precautions, H&P , NPO status , Patient's Chart, lab work & pertinent test results  History of Anesthesia Complications (+) PONV, Family history of anesthesia reaction and history of anesthetic complications  Airway Mallampati: III  TM Distance: >3 FB Neck ROM: Full    Dental no notable dental hx. (+) Teeth Intact, Dental Advisory Given   Pulmonary neg pulmonary ROS   Pulmonary exam normal breath sounds clear to auscultation       Cardiovascular hypertension, Pt. on medications + CAD   Rhythm:Regular Rate:Normal     Neuro/Psych negative neurological ROS  negative psych ROS   GI/Hepatic negative GI ROS, Neg liver ROS,,,  Endo/Other  negative endocrine ROS    Renal/GU negative Renal ROS  negative genitourinary   Musculoskeletal  (+) Arthritis , Osteoarthritis,    Abdominal   Peds  Hematology negative hematology ROS (+)   Anesthesia Other Findings   Reproductive/Obstetrics negative OB ROS                             Anesthesia Physical Anesthesia Plan  ASA: 2  Anesthesia Plan: General   Post-op Pain Management: Tylenol  PO (pre-op)*   Induction: Intravenous  PONV Risk Score and Plan: 4 or greater and Ondansetron , Dexamethasone , Droperidol and Propofol  infusion  Airway Management Planned: Oral ETT  Additional Equipment:   Intra-op Plan:   Post-operative Plan: Extubation in OR  Informed Consent: I have reviewed the patients History and Physical, chart, labs and discussed the procedure including the risks, benefits and alternatives for the proposed anesthesia with the patient or authorized representative who has indicated his/her understanding and acceptance.     Dental advisory given  Plan Discussed with: CRNA  Anesthesia Plan Comments:        Anesthesia  Quick Evaluation

## 2024-04-04 NOTE — Anesthesia Postprocedure Evaluation (Signed)
 Anesthesia Post Note  Patient: Carol Cox  Procedure(s) Performed: POSTERIOR SUPPLEMENTAL FUSION ,INSTRUMENTED LUMBAR TWO-TO LUMBAR THREE,LUMBAR THREE TO LUMBAR FOUR FUSION POSSIBLE LUMBAR TWO TO LUMBAR FOUR DECOMPRESSION     Patient location during evaluation: PACU Anesthesia Type: General Level of consciousness: awake and alert Pain management: pain level controlled Vital Signs Assessment: post-procedure vital signs reviewed and stable Respiratory status: spontaneous breathing, nonlabored ventilation, respiratory function stable and patient connected to nasal cannula oxygen Cardiovascular status: blood pressure returned to baseline and stable Postop Assessment: no apparent nausea or vomiting Anesthetic complications: no  No notable events documented.  Last Vitals:  Vitals:   04/04/24 1615 04/04/24 1630  BP: 132/70 131/72  Pulse: 88 87  Resp: 18 18  Temp:  37 C  SpO2: 92% 93%    Last Pain:  Vitals:   04/04/24 1630  TempSrc:   PainSc: 0-No pain    LLE Motor Response: Purposeful movement (04/04/24 1630) LLE Sensation: Full sensation (04/04/24 1630) RLE Motor Response: Purposeful movement (04/04/24 1630) RLE Sensation: Full sensation (04/04/24 1630)      Roniesha Hollingshead,W. EDMOND

## 2024-04-04 NOTE — Plan of Care (Signed)
  Problem: Education: Goal: Ability to verbalize activity precautions or restrictions will improve Outcome: Progressing Goal: Knowledge of the prescribed therapeutic regimen will improve Outcome: Progressing Goal: Understanding of discharge needs will improve Outcome: Progressing   

## 2024-04-04 NOTE — Plan of Care (Signed)

## 2024-04-04 NOTE — Progress Notes (Addendum)
 Subjective: Procedure(s) (LRB): EXTREME LATERAL INTERBODY FUSION LUMBAR TWO - THREE TO LUMBAR THREE - FOUR (N/A) 1 Day Post-Op  Patient reports pain as 9 on 0-10 scale.  Reports decreased leg pain reports incisional flank pain   Positive void into foley cath; yellow in color, clear  Negative bowel movement Negative flatus Negative chest pain or shortness of breath  Objective: Vital signs in last 24 hours: Temp:  [97.7 F (36.5 C)-98.3 F (36.8 C)] 98.3 F (36.8 C) (06/10 0731) Pulse Rate:  [65-79] 74 (06/10 0731) Resp:  [10-24] 16 (06/10 0731) BP: (89-151)/(55-85) 122/69 (06/10 0731) SpO2:  [92 %-100 %] 98 % (06/10 0731)  Intake/Output from previous day: 06/09 0701 - 06/10 0700 In: 1580 [P.O.:480; I.V.:1000; IV Piggyback:100] Out: 1900 [Urine:1875; Blood:25]  Labs: No results for input(s): "WBC", "RBC", "HCT", "PLT" in the last 72 hours. No results for input(s): "NA", "K", "CL", "CO2", "BUN", "CREATININE", "GLUCOSE", "CALCIUM " in the last 72 hours. No results for input(s): "LABPT", "INR" in the last 72 hours.  Physical Exam: Neurologically intact ABD soft Neurovascular intact Sensation intact distally Intact pulses distally Dorsiflexion/Plantar flexion intact Incision: dressing C/D/I No cellulitis present Compartment soft Body mass index is 31.62 kg/m.   Assessment/Plan:  Assessment: Romey is a very pleasant 73 year old female who is postop day 1 XLIF lumbar 2 to lumbar 4.  Overall she is doing well and is stable.  Surgical intervention was successful and was without complications.  She will return to the OR for day 2 of the XLIF procedure; we will do the posterior supplemental instrumented fusion of the lumbar 2 the lumbar 4.  Patient states that her preoperative radicular leg pain is significantly improved.  Therefore I do not believe that we have to move forward with the decompression of the lumbar to the lumbar for today and we can just do the posterior  supplemental instrumented fusion of those levels.  She states that her pain is severe but is improving with pain medication.  She has gotten multiple doses of Dilaudid  which she states helped significantly.  She states that she is having right sided pain on her body.  This could be due to positioning during surgery.  There is no signs of any bruising on that side.  This could also be due to gas as the patient has not had any positive flatus at this time.  Patient does state that she is having some flank pain and some hip flexor pain.    Patient is voiding with Foley catheter, urine is yellow and clear.  Negative flatus, negative bowel movement.  Patient will mobilize with physical therapy.  Patient to continue incentive spirometry.  Plan: - Proceed with day 2 of surgical intervention which will be an PSIF of lumbar 2 through lumbar 3 and lumbar 3 through lumbar 4.  Because her preoperative radicular leg pain is improved I do not see a need to move forward with the decompression and we can just proceed with the posterior supplemental instrumented fusion. - Continue care - Mobilize with physical therapy - Continue pain medical management - Remain n.p.o. until second part of surgery which is scheduled for 12:30 PM today - Continue to utilize incentive spirometry     D[place] Due PA-C for Mort Ards, MD [facility] [phone]   Patient complains predominantly of surgical pain.  Patient has anterior hip and groin pain secondary to the psoas approach.  Preoperative neuropathic leg pain has improved.  Given the improvement I do  not think a formal decompression is required.  Will plan on moving forward with the supplemental posterior pedicle screw fixation at L2 and L3 and we will extend this to the existing L4-S1 instrumentation.  Will also do a posterior lateral arthrodesis.  I have explained this to the patient in great detail and she is expressed an understanding and willingness to  move forward with surgery.  Risks and benefits of surgery were also discussed.

## 2024-04-05 MED ORDER — SENNOSIDES-DOCUSATE SODIUM 8.6-50 MG PO TABS
1.0000 | ORAL_TABLET | Freq: Two times a day (BID) | ORAL | Status: DC
  Administered 2024-04-05 – 2024-04-07 (×5): 1 via ORAL
  Filled 2024-04-05 (×5): qty 1

## 2024-04-05 MED ORDER — OXYCODONE HCL 5 MG PO TABS
5.0000 mg | ORAL_TABLET | ORAL | Status: DC | PRN
  Administered 2024-04-05 – 2024-04-07 (×8): 10 mg via ORAL
  Filled 2024-04-05 (×8): qty 2

## 2024-04-05 NOTE — Evaluation (Signed)
 Physical Therapy Evaluation  Patient Details Name: Carol Cox MRN: [id number] DOB: [birth year] Today's Date: 04/05/2024  History of Present Illness  Carol Cox is a 73 yo female who underwent L2-L4 XLIF on 04/03/2024 and L2-L4 PLIF on 04/04/2024.  PMH significant for prior back surgery, HTN, L TKA, CAD, and osteoporosis.  Clinical Impression  Pt admitted with above diagnosis. At the time of PT eval, pt was able to demonstrate transfers and ambulation with up to CGA and RW for support. Pt was educated on precautions, brace application/wearing schedule, and appropriate activity progression. Pt currently with functional limitations due to the deficits listed below (see PT Problem List). Pt will benefit from skilled PT to increase their independence and safety with mobility to allow discharge to the venue listed below.          If plan is discharge home, recommend the following: A little help with walking and/or transfers;Assist for transportation;Help with stairs or ramp for entrance;A little help with bathing/dressing/bathroom   Can travel by private vehicle        Equipment Recommendations None recommended by PT  Recommendations for Other Services       Functional Status Assessment Patient has had a recent decline in their functional status and demonstrates the ability to make significant improvements in function in a reasonable and predictable amount of time.     Precautions / Restrictions Precautions Precautions: Fall;Back Precaution Booklet Issued: Yes (comment) Recall of Precautions/Restrictions: Intact Precaution/Restrictions Comments: Reviewed handout and pt was cued for precautions during functional mobility. Required Braces or Orthoses: Spinal Brace Spinal Brace: Lumbar corset;Applied in sitting position Restrictions Weight Bearing Restrictions Per Provider Order: No      Mobility  Bed Mobility Overal bed mobility: Needs Assistance Bed Mobility: Rolling,  Sidelying to Sit, Sit to Sidelying Rolling: Supervision Sidelying to sit: Supervision     Sit to sidelying: Contact guard assist General bed mobility comments: HOB flat and rails lowered to simulate home environment. VC's throughout for optimal log roll technique. Pt rushing through sit>sidelying>supine due to pain.    Transfers Overall transfer level: Needs assistance Equipment used: Rolling walker (2 wheels) Transfers: Sit to/from Stand Sit to Stand: Supervision           General transfer comment: VC's for wide BOS and hand placement on seated surface for safety. No assist required but close supervision provided for safety.    Ambulation/Gait Ambulation/Gait assistance: Contact guard assist Gait Distance (Feet): 60 Feet Assistive device: Rolling walker (2 wheels) Gait Pattern/deviations: Step-through pattern, Decreased stride length, Trunk flexed, Narrow base of support Gait velocity: Decreased Gait velocity interpretation: <1.31 ft/sec, indicative of household ambulator   General Gait Details: Decreased step/stride length and slow gait speed. Pt reports feeling slightly lightheaded with weak legs. No knee buckling noted and no overt LOB noted.  Stairs            Wheelchair Mobility     Tilt Bed    Modified Rankin (Stroke Patients Only)       Balance Overall balance assessment: Needs assistance Sitting-balance support: Feet supported Sitting balance-Leahy Scale: Good     Standing balance support: No upper extremity supported, During functional activity Standing balance-Leahy Scale: Fair Standing balance comment: statically stood at the sink for grooming tasks                             Pertinent Vitals/Pain Pain Assessment Pain Assessment: Faces Faces Pain Scale:  Hurts even more Pain Location: back Pain Descriptors / Indicators: Operative site guarding, Sore Pain Intervention(s): Limited activity within patient's tolerance, Monitored  during session, Repositioned    Home Living Family/patient expects to be discharged to:: Private residence Living Arrangements: Spouse/significant other Available Help at Discharge: Family;Available 24 hours/day Type of Home: House Home Access: Stairs to enter   [employer] of Steps: 3 at side door, no rail. 5 in front with rail.   Home Layout: Two level;Able to live on main level with bedroom/bathroom Home Equipment: Jeananne Mighty - single point;Shower seat - built Magazine features editor (2 wheels);Adaptive equipment      Prior Function Prior Level of Function : Independent/Modified Independent;Driving               ADLs Comments: indep ADLs, drives     Extremity/Trunk Assessment   Upper Extremity Assessment Upper Extremity Assessment: Overall WFL for tasks assessed    Lower Extremity Assessment Lower Extremity Assessment: Generalized weakness (COnsistent with pre-op diagnosis)    Cervical / Trunk Assessment Cervical / Trunk Assessment: Back Surgery  Communication   Communication Communication: No apparent difficulties    Cognition Arousal: Alert Behavior During Therapy: WFL for tasks assessed/performed                             Following commands: Intact       Cueing Cueing Techniques: Verbal cues     General Comments General comments (skin integrity, edema, etc.): VSS on RA    Exercises     Assessment/Plan    PT Assessment Patient needs continued PT services  PT Problem List Decreased strength;Decreased activity tolerance;Decreased balance;Decreased mobility;Decreased knowledge of use of DME;Decreased safety awareness;Decreased knowledge of precautions;Pain       PT Treatment Interventions DME instruction;Gait training;Stair training;Functional mobility training;Therapeutic activities;Therapeutic exercise;Balance training;Patient/family education    PT Goals (Current goals can be found in the Care Plan section)  Acute Rehab  PT Goals Patient Stated Goal: not home today. reports she does not feel ready PT Goal Formulation: With patient Time For Goal Achievement: 04/12/24 Potential to Achieve Goals: Good    Frequency Min 5X/week     Co-evaluation               AM-PAC PT 6 Clicks Mobility  Outcome Measure Help needed turning from your back to your side while in a flat bed without using bedrails?: A Little Help needed moving from lying on your back to sitting on the side of a flat bed without using bedrails?: A Little Help needed moving to and from a bed to a chair (including a wheelchair)?: A Little Help needed standing up from a chair using your arms (e.g., wheelchair or bedside chair)?: A Little Help needed to walk in hospital room?: A Little Help needed climbing 3-5 steps with a railing? : A Little 6 Click Score: 18    End of Session Equipment Utilized During Treatment: Gait belt;Back brace Activity Tolerance: Patient tolerated treatment well Patient left: in bed;with call bell/phone within reach Nurse Communication: Mobility status PT Visit Diagnosis: Unsteadiness on feet (R26.81);Pain Pain - part of body:  (back)    Time: 8657-8469 PT Time Calculation (min) (ACUTE ONLY): 20 min   Charges:   PT Evaluation $PT Eval Low Complexity: 1 Low   PT General Charges $$ ACUTE PT VISIT: 1 Visit         Simone Dubois, PT, DPT Acute Rehabilitation Services Secure Chat Preferred Office:  [phone]   [name] 04/05/2024, 10:26 AM

## 2024-04-05 NOTE — Progress Notes (Signed)
    Subjective: Procedure(s) (LRB): POSTERIOR SUPPLEMENTAL FUSION ,INSTRUMENTED LUMBAR TWO-TO LUMBAR THREE,LUMBAR THREE TO LUMBAR FOUR FUSION POSSIBLE LUMBAR TWO TO LUMBAR FOUR DECOMPRESSION (N/A) 1 Day Post-Op  Patient reports pain as 4 on 0-10 scale.  Reports decreased leg pain reports incisional back pain  and left flank pain  Positive void Negative bowel movement Positive flatus Negative chest pain or shortness of breath  Objective: Vital signs in last 24 hours: Temp:  [97.8 F (36.6 C)-98.7 F (37.1 C)] 97.9 F (36.6 C) (06/11 0452) Pulse Rate:  [71-98] 77 (06/11 0452) Resp:  [9-20] 20 (06/11 0452) BP: (122-136)/(66-78) 132/67 (06/11 0452) SpO2:  [92 %-98 %] 96 % (06/11 0452)  Intake/Output from previous day: 06/10 0701 - 06/11 0700 In: 1100 [I.V.:500; IV Piggyback:600] Out: 1880 [Urine:1850; Blood:30]  Labs: No results for input(s): WBC, RBC, HCT, PLT in the last 72 hours. No results for input(s): NA, K, CL, CO2, BUN, CREATININE, GLUCOSE, CALCIUM  in the last 72 hours. No results for input(s): LABPT, INR in the last 72 hours.  Physical Exam: Neurologically intact Neurovascular intact Sensation intact distally Intact pulses distally Dorsiflexion/Plantar flexion intact Incision: scant drainage No cellulitis present Compartment soft Body mass index is 31.62 kg/m.   Assessment/Plan: Assessment: Carol Cox is a very pleasant 73 year old female who is postop day 2 XLIF lumbar 2 to lumbar 4 and POD 1 from PSIF L2-L4. We were able to successfully extend the fixation to her existing L4-S1 instrumentation. Overall she is doing well and is stable.  Surgical intervention was successful and was without complications.  .  Patient states that her preoperative radicular leg pain is significantly improved.  She complains of surgical pian, her pain is much more well controlled today. She states that she is still having right sided pain on her bod but it is  not as severe as yesterday and is only with ROM.  This could be due to positioning during surgery or msk strain due to overcompensating.  There are no signs of any bruising on that side.  Patient does state that she is having some flank pain and some hip flexor pain.     Patient is voiding, positive flatus, negative BM. Tolerating oral intake well.  There is scant serosanguinous drainage on the back incision dressing, it is about the size of a quarter and has not progressed through the night shift. Patient will mobilize with physical therapy.  Patient to continue incentive spirometry.   Plan: - Continue care - Mobilize with physical therapy - Continue pain medical management - Continue to utilize incentive spirometry -plan for D/C to home once we have clearance from PT/OT; pain is well controlled with oral medications as early as tomorrow afternoon.   Letty Raya PA-C for Mort Ards, MD [facility] [phone]

## 2024-04-05 NOTE — Evaluation (Signed)
 Occupational Therapy Evaluation Patient Details Name: Carol Cox MRN: [id number] DOB: 1951/03/11 Today's Date: 04/05/2024   History of Present Illness   Carol Cox is a 73 yo female who underwent XLIF L2-4 and PSFI decompression L2-4. PMHx: back surgery, HTN, HLD. L TKA, CAD, and osteoporosis.     Clinical Impressions Carol Cox was evaluated s/p the above admission list. She is indep at baseline and lives with family. Upon evaluation the pt was limited by back pain, spinal precautions, and limited activity tolerance. Overall she was able to mobilize with RW and superivsion A. Due to the deficits listed below the pt also needs up to min A for LB ADLs, pt has a reacher at home to increase indep with tasks. Pt will benefit from continued acute OT services and discharge home with support of family as needed.       If plan is discharge home, recommend the following:   A little help with bathing/dressing/bathroom;Assistance with cooking/housework;Assist for transportation     Functional Status Assessment   Patient has had a recent decline in their functional status and demonstrates the ability to make significant improvements in function in a reasonable and predictable amount of time.     Equipment Recommendations   None recommended by OT      Precautions/Restrictions   Precautions Precautions: Fall;Back Precaution Booklet Issued: Yes (comment) Recall of Precautions/Restrictions: Intact Precaution/Restrictions Comments: pt recalled 3/3 BLT at the start of the session Required Braces or Orthoses: Spinal Brace Spinal Brace: Lumbar corset Restrictions Weight Bearing Restrictions Per Provider Order: No     Mobility Bed Mobility Overal bed mobility: Needs Assistance Bed Mobility: Rolling, Sidelying to Sit, Sit to Sidelying Rolling: Supervision Sidelying to sit: Supervision     Sit to sidelying: Contact guard assist General bed mobility comments: increased pain  with bed mobility    Transfers Overall transfer level: Needs assistance Equipment used: Rolling walker (2 wheels) Transfers: Sit to/from Stand Sit to Stand: Supervision           General transfer comment: from bed and toilet      Balance Overall balance assessment: Needs assistance Sitting-balance support: Feet supported Sitting balance-Leahy Scale: Good     Standing balance support: No upper extremity supported, During functional activity Standing balance-Leahy Scale: Fair Standing balance comment: statically stood at the sink for grooming tasks                           ADL either performed or assessed with clinical judgement   ADL Overall ADL's : Needs assistance/impaired Eating/Feeding: Independent;Sitting   Grooming: Supervision/safety;Standing   Upper Body Bathing: Set up;Sitting   Lower Body Bathing: Sit to/from stand;Supervison/ safety   Upper Body Dressing : Set up;Sitting   Lower Body Dressing: Minimal assistance;Sit to/from stand Lower Body Dressing Details (indicate cue type and reason): min A to thread L leg into pants, pt has a reacher to assist wtih this task Toilet Transfer: Supervision/safety;Rolling walker (2 wheels);Regular Toilet   Toileting- Clothing Manipulation and Hygiene: Modified independent;Sitting/lateral lean       Functional mobility during ADLs: Supervision/safety;Rolling walker (2 wheels) General ADL Comments: pt was able to maintain spinal precuations with VCs and minimal assist     Vision Baseline Vision/History: 1 Wears glasses Ability to See in Adequate Light: 0 Adequate Vision Assessment?: No apparent visual deficits     Perception Perception: Not tested       Praxis Praxis: Not tested  Pertinent Vitals/Pain Pain Assessment Pain Assessment: Faces Faces Pain Scale: Hurts little more Pain Location: back Pain Descriptors / Indicators: Discomfort, Grimacing, Guarding Pain Intervention(s): Monitored  during session, Limited activity within patient's tolerance     Extremity/Trunk Assessment Upper Extremity Assessment Upper Extremity Assessment: Overall WFL for tasks assessed   Lower Extremity Assessment Lower Extremity Assessment: Defer to PT evaluation   Cervical / Trunk Assessment Cervical / Trunk Assessment: Back Surgery   Communication Communication Communication: No apparent difficulties   Cognition Arousal: Alert Behavior During Therapy: WFL for tasks assessed/performed Cognition: No apparent impairments             OT - Cognition Comments: recalled 3/3 back precuations from prior back surgery                 Following commands: Intact       Cueing  General Comments   Cueing Techniques: Verbal cues  VSS on RA   Exercises     Shoulder Instructions      Home Living Family/patient expects to be discharged to:: Private residence Living Arrangements: Spouse/significant other Available Help at Discharge: Family;Available 24 hours/day Type of Home: House Home Access: Stairs to enter [employer] of Steps: 3 at side door, no rail. 5 in front with rail.   Home Layout: Two level;Able to live on main level with bedroom/bathroom     Bathroom Shower/Tub: Producer, television/film/video: Standard     Home Equipment: Cane - single point;Shower seat - built Magazine features editor (2 wheels);Adaptive equipment Adaptive Equipment: Reacher        Prior Functioning/Environment Prior Level of Function : Independent/Modified Independent;Driving               ADLs Comments: indep ADLs, drives    OT Problem List: Decreased strength;Decreased range of motion;Decreased activity tolerance;Impaired balance (sitting and/or standing);Decreased safety awareness;Decreased knowledge of use of DME or AE;Decreased knowledge of precautions;Pain   OT Treatment/Interventions: Self-care/ADL training;Therapeutic exercise;DME and/or AE  instruction;Therapeutic activities;Patient/family education      OT Goals(Current goals can be found in the care plan section)   Acute Rehab OT Goals Patient Stated Goal: less pain OT Goal Formulation: With patient Time For Goal Achievement: 04/19/24 Potential to Achieve Goals: Good ADL Goals Additional ADL Goal #1: Pt will complete ADLs with mod I while maintaining back precuations   OT Frequency:  Min 2X/week       AM-PAC OT 6 Clicks Daily Activity     Outcome Measure Help from another person eating meals?: None Help from another person taking care of personal grooming?: A Little Help from another person toileting, which includes using toliet, bedpan, or urinal?: A Little Help from another person bathing (including washing, rinsing, drying)?: A Little Help from another person to put on and taking off regular upper body clothing?: A Little Help from another person to put on and taking off regular lower body clothing?: A Little 6 Click Score: 19   End of Session Equipment Utilized During Treatment: Rolling walker (2 wheels);Back brace Nurse Communication: Mobility status  Activity Tolerance: Patient tolerated treatment well Patient left: in bed;with call bell/phone within reach  OT Visit Diagnosis: Unsteadiness on feet (R26.81);Other abnormalities of gait and mobility (R26.89);Muscle weakness (generalized) (M62.81);Pain                Time: 1610-9604 OT Time Calculation (min): 16 min Charges:  OT General Charges $OT Visit: 1 Visit OT Evaluation $OT Eval Low Complexity: 1 Low  Beverley Allender  Debbie Fails, OTR/L Acute Rehabilitation Services Office [phone] Secure Chat Communication Preferred   Starla Easterly 04/05/2024, 9:45 AM

## 2024-04-06 NOTE — Progress Notes (Signed)
 Physical Therapy Treatment  Patient Details Name: Carol Cox MRN: [id number] DOB: 12-04-1950 Today's Date: 04/06/2024   History of Present Illness Carol Cox is a 73 yo female who underwent L2-L4 XLIF on 04/03/2024 and L2-L4 PLIF on 04/04/2024.  PMH significant for prior back surgery, HTN, L TKA, CAD, and osteoporosis.    PT Comments  Pt progressing slowly with post-op mobility. She was able to demonstrate transfers and ambulation with gross min assist. Pt limited significantly by pain this session, and once standing, pt reports unable to advance LE's to walk due to pain. Increased time to return to the bed. Reinforced education on precautions, brace application/wearing schedule. Will continue to follow.      If plan is discharge home, recommend the following: A little help with walking and/or transfers;Assist for transportation;Help with stairs or ramp for entrance;A little help with bathing/dressing/bathroom   Can travel by private vehicle        Equipment Recommendations  None recommended by PT    Recommendations for Other Services       Precautions / Restrictions Precautions Precautions: Fall;Back Precaution Booklet Issued: Yes (comment) Recall of Precautions/Restrictions: Intact Precaution/Restrictions Comments: Reviewed handout and pt was cued for precautions during functional mobility. Required Braces or Orthoses: Spinal Brace Spinal Brace: Lumbar corset;Applied in sitting position Restrictions Weight Bearing Restrictions Per Provider Order: No     Mobility  Bed Mobility Overal bed mobility: Needs Assistance Bed Mobility: Rolling, Sidelying to Sit, Sit to Sidelying Rolling: Supervision Sidelying to sit: Min assist     Sit to sidelying: Min assist General bed mobility comments: Assist to elevate legs up into bed at end of session    Transfers Overall transfer level: Needs assistance Equipment used: Rolling walker (2 wheels) Transfers: Sit to/from  Stand Sit to Stand: Contact guard assist           General transfer comment: VC's for wide BOS and hand placement on seated surface for safety. No assist required but hands on guarding provided for safety    Ambulation/Gait               General Gait Details: Unable to progress to ambulation due to pain   Stairs             Wheelchair Mobility     Tilt Bed    Modified Rankin (Stroke Patients Only)       Balance Overall balance assessment: Needs assistance Sitting-balance support: Feet supported Sitting balance-Leahy Scale: Good     Standing balance support: No upper extremity supported, During functional activity Standing balance-Leahy Scale: Fair Standing balance comment: statically stood at the sink for grooming tasks                            Communication Communication Communication: No apparent difficulties  Cognition Arousal: Alert Behavior During Therapy: Anxious   PT - Cognitive impairments: No apparent impairments                         Following commands: Intact      Cueing Cueing Techniques: Verbal cues  Exercises      General Comments        Pertinent Vitals/Pain Pain Assessment Pain Assessment: Faces Faces Pain Scale: Hurts worst Pain Location: back, R side Pain Descriptors / Indicators: Operative site guarding, Sore, Aching, Constant Pain Intervention(s): Limited activity within patient's tolerance, Monitored during session, Repositioned  Home Living                          Prior Function            PT Goals (current goals can now be found in the care plan section) Acute Rehab PT Goals Patient Stated Goal: not home today. reports she does not feel ready PT Goal Formulation: With patient Time For Goal Achievement: 04/12/24 Potential to Achieve Goals: Good Progress towards PT goals: Progressing toward goals    Frequency    Min 5X/week      PT Plan      Co-evaluation               AM-PAC PT 6 Clicks Mobility   Outcome Measure  Help needed turning from your back to your side while in a flat bed without using bedrails?: A Little Help needed moving from lying on your back to sitting on the side of a flat bed without using bedrails?: A Little Help needed moving to and from a bed to a chair (including a wheelchair)?: A Little Help needed standing up from a chair using your arms (e.g., wheelchair or bedside chair)?: A Little Help needed to walk in hospital room?: A Little Help needed climbing 3-5 steps with a railing? : A Little 6 Click Score: 18    End of Session Equipment Utilized During Treatment: Gait belt;Back brace Activity Tolerance: Patient tolerated treatment well Patient left: in bed;with call bell/phone within reach Nurse Communication: Mobility status PT Visit Diagnosis: Unsteadiness on feet (R26.81);Pain Pain - part of body:  (back)     Time: 1610-9604 PT Time Calculation (min) (ACUTE ONLY): 20 min  Charges:    $Therapeutic Activity: 8-22 mins PT General Charges $$ ACUTE PT VISIT: 1 Visit                     Simone Dubois, PT, DPT Acute Rehabilitation Services Secure Chat Preferred Office: [phone]    Carol Cox 04/06/2024, 10:01 AM

## 2024-04-06 NOTE — Progress Notes (Signed)
    Subjective: Procedure(s) (LRB): POSTERIOR SUPPLEMENTAL FUSION ,INSTRUMENTED LUMBAR TWO-TO LUMBAR THREE,LUMBAR THREE TO LUMBAR FOUR FUSION POSSIBLE LUMBAR TWO TO LUMBAR FOUR DECOMPRESSION (N/A) 2 Days Post-Op  Patient reports pain as 5 on 0-10 scale.  Reports decreased leg pain Reports incisional back pain  and flank pain from XLIF approach incision  Positive void Positive bowel movement Positive flatus Negative chest pain or shortness of breath  Objective: Vital signs in last 24 hours: Temp:  [97.5 F (36.4 C)-99.4 F (37.4 C)] 99 F (37.2 C) (06/12 0756) Pulse Rate:  [82-88] 88 (06/12 0756) Resp:  [16-18] 18 (06/12 0756) BP: (115-143)/(65-75) 115/66 (06/12 0756) SpO2:  [92 %-96 %] 93 % (06/12 0756)  Intake/Output from previous day: 06/11 0701 - 06/12 0700 In: 720 [P.O.:720] Out: -   Labs: No results for input(s): WBC, RBC, HCT, PLT in the last 72 hours. No results for input(s): NA, K, CL, CO2, BUN, CREATININE, GLUCOSE, CALCIUM  in the last 72 hours. No results for input(s): LABPT, INR in the last 72 hours.  Physical Exam: Neurologically intact Neurovascular intact Sensation intact distally Intact pulses distally Dorsiflexion/Plantar flexion intact Incision: scant drainage No cellulitis present Compartment soft Body mass index is 31.62 kg/m.   Assessment/Plan: Assessment: Carol Cox is a very pleasant 73 year old female who is postop day 3 XLIF lumbar 2 to lumbar 4 and POD 2 from PSIF L2-L4. We were able to successfully extend the fixation to her existing L4-S1 instrumentation. Overall she is doing well and is stable.  Surgical intervention was successful and was without complications.  .  Patient states that her preoperative radicular leg pain is significantly improved.  She complains of surgical pian, her pain is much more well controlled today. She states that she is still having right sided pain on her body but it is not as severe as  yesterday and is only with ROM.  This could be due to positioning during surgery or msk strain due to overcompensating.  There are no signs of any bruising on that side.  Patient does state that she is having some flank pain and some hip flexor pain.     Patient is voiding, positive flatus, positive BM. Tolerating oral intake well.  There is scant serosanguinous drainage on the back incision dressing, it is about the size of a quarter and has not progressed since assessment yesterday. Will plan to change dressing today. Patient will mobilize with physical therapy.  Patient to continue incentive spirometry.   Plan: - Continue care - Mobilize with physical therapy - Continue pain medical management - Continue to utilize incentive spirometry -Plan for D/C to home possibly tomorrow--patient has been cleared by PT, they recommend no equipment and and recommend some help with walking -Discontinue IV Dilaudid    Carol Guarino PA-C for Mort Ards, MD [facility] [phone]

## 2024-04-07 NOTE — Progress Notes (Signed)
Patient alert and oriented, void, ambulate. Surgical site clean and dry no sign of infection. D/c instructions explain and given  all questions answered. Patient d/c home per order.

## 2024-04-07 NOTE — Progress Notes (Signed)
 Physical Therapy Treatment  Patient Details Name: Carol Cox MRN: [id number] DOB: [birth year] Today's Date: 04/07/2024   History of Present Illness Carol Cox is a 73 yo female who underwent L2-L4 XLIF on 04/03/2024 and L2-L4 PLIF on 04/04/2024.  PMH significant for prior back surgery, HTN, L TKA, CAD, and osteoporosis.    PT Comments  Pt progressing with post-op mobility. She was able to demonstrate transfers and ambulation with gross supervision for safety to CGA and RW for support. Reinforced education on precautions, brace application/wearing schedule, appropriate activity progression, and car transfer. Will continue to follow.      If plan is discharge home, recommend the following: A little help with walking and/or transfers;Assist for transportation;Help with stairs or ramp for entrance;A little help with bathing/dressing/bathroom   Can travel by private vehicle        Equipment Recommendations  None recommended by PT    Recommendations for Other Services       Precautions / Restrictions Precautions Precautions: Fall;Back Precaution Booklet Issued: Yes (comment) Recall of Precautions/Restrictions: Intact Precaution/Restrictions Comments: Reviewed handout and pt was cued for precautions during functional mobility. Required Braces or Orthoses: Spinal Brace Spinal Brace: Lumbar corset;Applied in sitting position Restrictions Weight Bearing Restrictions Per Provider Order: No     Mobility  Bed Mobility Overal bed mobility: Needs Assistance Bed Mobility: Rolling, Sidelying to Sit, Sit to Sidelying Rolling: Supervision Sidelying to sit: Supervision     Sit to sidelying: Supervision General bed mobility comments: Bed rail use as pt's husband bought one for their home. Pt exited R side today as exiting on L side increased her pain. No assist required.    Transfers Overall transfer level: Needs assistance Equipment used: Rolling walker (2 wheels) Transfers: Sit  to/from Stand, Bed to chair/wheelchair/BSC Sit to Stand: Supervision           General transfer comment: VC's for hand placement on seated surface for safety and for wide BOS    Ambulation/Gait Ambulation/Gait assistance: Contact guard assist Gait Distance (Feet): 200 Feet Assistive device: Rolling walker (2 wheels) Gait Pattern/deviations: Step-through pattern, Decreased stride length, Trunk flexed, Narrow base of support Gait velocity: Decreased Gait velocity interpretation: <1.31 ft/sec, indicative of household ambulator   General Gait Details: Improved gait speed, distance, and posture today throughout gait training.   Stairs Stairs: Yes Stairs assistance: Contact guard assist Stair Management: One rail Right, Step to pattern, Forwards Number of Stairs: 4 General stair comments: VC's for sequencing and general safety.   Wheelchair Mobility     Tilt Bed    Modified Rankin (Stroke Patients Only)       Balance Overall balance assessment: Needs assistance Sitting-balance support: Feet supported Sitting balance-Leahy Scale: Good     Standing balance support: No upper extremity supported, During functional activity Standing balance-Leahy Scale: Fair                              Hotel manager: No apparent difficulties  Cognition Arousal: Alert Behavior During Therapy: WFL for tasks assessed/performed   PT - Cognitive impairments: No apparent impairments                         Following commands: Intact      Cueing Cueing Techniques: Verbal cues  Exercises      General Comments        Pertinent Vitals/Pain Pain Assessment Pain Assessment: 0-10  Pain Score: 5  Pain Location: back, R side Pain Descriptors / Indicators: Operative site guarding, Sore, Aching, Constant Pain Intervention(s): Limited activity within patient's tolerance, Monitored during session, Repositioned    Home Living                           Prior Function            PT Goals (current goals can now be found in the care plan section) Acute Rehab PT Goals Patient Stated Goal: Improve pain, be able to walk longer distance PT Goal Formulation: With patient Time For Goal Achievement: 04/12/24 Potential to Achieve Goals: Good Progress towards PT goals: Progressing toward goals    Frequency    Min 5X/week      PT Plan      Co-evaluation              AM-PAC PT 6 Clicks Mobility   Outcome Measure  Help needed turning from your back to your side while in a flat bed without using bedrails?: A Little Help needed moving from lying on your back to sitting on the side of a flat bed without using bedrails?: A Little Help needed moving to and from a bed to a chair (including a wheelchair)?: A Little Help needed standing up from a chair using your arms (e.g., wheelchair or bedside chair)?: A Little Help needed to walk in hospital room?: A Little Help needed climbing 3-5 steps with a railing? : A Little 6 Click Score: 18    End of Session Equipment Utilized During Treatment: Gait belt;Back brace Activity Tolerance: Patient tolerated treatment well Patient left: in bed;with call bell/phone within reach Nurse Communication: Mobility status PT Visit Diagnosis: Unsteadiness on feet (R26.81);Pain Pain - part of body:  (back)     Time: 1610-9604 PT Time Calculation (min) (ACUTE ONLY): 17 min  Charges:    $Gait Training: 8-22 mins PT General Charges $$ ACUTE PT VISIT: 1 Visit                     Simone Dubois, PT, DPT Acute Rehabilitation Services Secure Chat Preferred Office: [phone]    [name] 04/07/2024, 9:44 AM

## 2024-04-07 NOTE — Progress Notes (Addendum)
 Occupational Therapy Treatment Patient Details Name: Carol Cox MRN: [id number] DOB: [birth year] Today's Date: 04/07/2024   History of present illness Carol Cox is a 73 yo female who underwent L2-L4 XLIF on 04/03/2024 and L2-L4 PLIF on 04/04/2024.  PMH significant for prior back surgery, HTN, L TKA, CAD, and osteoporosis.   OT comments  Pt. Seen for skilled OT treatment session.  Able to complete bed mobility in/out of bed with S.  Don brace with set up.  Will have family assist for LB ADLs PRN and also has a Sports administrator for use.  Short distance ambulation to/from b.room with S.  Cues for maintaining precautions during functional mobility.  Progressing well with acute OT goals.  Note d/c home likely later today.        If plan is discharge home, recommend the following:      Equipment Recommendations  None recommended by OT    Recommendations for Other Services      Precautions / Restrictions Precautions Precautions: Fall;Back Precaution/Restrictions Comments: able to recall 2/3 but required cues for no arching Required Braces or Orthoses: Spinal Brace Spinal Brace: Lumbar corset;Applied in sitting position       Mobility Bed Mobility   Bed Mobility: Rolling, Sidelying to Sit, Sit to Sidelying Rolling: Supervision Sidelying to sit: Supervision     Sit to sidelying: Supervision General bed mobility comments: hob slighlty elevated to mimic how many pillows used at home. pt. also reports her husband got her a bed rail so she used the bed rail today. no physical assist for bles in/out of bed    Transfers Overall transfer level: Needs assistance Equipment used: Rolling walker (2 wheels) Transfers: Sit to/from Stand, Bed to chair/wheelchair/BSC Sit to Stand: Supervision     Step pivot transfers: Supervision     General transfer comment: cues not to bend too much forward when going from stand/sit on commode     Balance                                            ADL either performed or assessed with clinical judgement   ADL Overall ADL's : Needs assistance/impaired     Grooming: Wash/dry hands;Standing;Supervision/safety               Lower Body Dressing: Maximal assistance;Sitting/lateral leans Lower Body Dressing Details (indicate cue type and reason): unable to figure 4 to reach BLEs for socks.  reports family is available to assist prn and also has a Risk analyst: Supervision/safety;Rolling walker (2 wheels);Regular Toilet   Toileting- Clothing Manipulation and Hygiene: Modified independent;Sitting/lateral lean Toileting - Clothing Manipulation Details (indicate cue type and reason): cues for no twisting while throwing t.paper in toilet after use     Functional mobility during ADLs: Supervision/safety;Rolling walker (2 wheels)      Extremity/Trunk Assessment              Vision       Perception     Praxis     Communication Communication Communication: No apparent difficulties   Cognition Arousal: Alert Behavior During Therapy: WFL for tasks assessed/performed Cognition: No apparent impairments                               Following commands: Intact        Cueing  Cueing Techniques: Verbal cues  Exercises      Shoulder Instructions       General Comments      Pertinent Vitals/ Pain       Pain Assessment Pain Assessment: No/denies pain  Home Living                                          Prior Functioning/Environment              Frequency  Min 2X/week        Progress Toward Goals  OT Goals(current goals can now be found in the care plan section)  Progress towards OT goals: Progressing toward goals     Plan      Co-evaluation                 AM-PAC OT 6 Clicks Daily Activity     Outcome Measure   Help from another person eating meals?: None Help from another person taking care of personal grooming?: A  Little Help from another person toileting, which includes using toliet, bedpan, or urinal?: A Little Help from another person bathing (including washing, rinsing, drying)?: A Little Help from another person to put on and taking off regular upper body clothing?: A Little Help from another person to put on and taking off regular lower body clothing?: A Little 6 Click Score: 19    End of Session Equipment Utilized During Treatment: Rolling walker (2 wheels);Back brace  OT Visit Diagnosis: Unsteadiness on feet (R26.81);Other abnormalities of gait and mobility (R26.89);Muscle weakness (generalized) (M62.81);Pain   Activity Tolerance Patient tolerated treatment well   Patient Left in bed;with call bell/phone within reach   Nurse Communication  Secure chat with pts. Questions regarding when she can shower and if she needs more stool softeners        Time: 0981-1914 OT Time Calculation (min): 13 min  Charges: OT General Charges $OT Visit: 1 Visit OT Treatments $Self Care/Home Management : 8-22 mins  Howell Macintosh, COTA/L Acute Rehabilitation [phone]   Leory Rands [place]-COTA/L  04/07/2024, 8:23 AM

## 2024-04-07 NOTE — Progress Notes (Signed)
    Subjective: Procedure(s) (LRB): POSTERIOR SUPPLEMENTAL FUSION ,INSTRUMENTED LUMBAR TWO-TO LUMBAR THREE,LUMBAR THREE TO LUMBAR FOUR FUSION POSSIBLE LUMBAR TWO TO LUMBAR FOUR DECOMPRESSION (N/A) 3 Days Post-Op  Patient reports pain as 6 on 0-10 scale.  Reports decreased leg pain reports incisional back pain  and flank pain  Positive void Positive bowel movement Positive flatus Negative chest pain or shortness of breath  Objective: Vital signs in last 24 hours: Temp:  [98.2 F (36.8 C)-99.3 F (37.4 C)] 98.8 F (37.1 C) (06/13 0733) Pulse Rate:  [80-88] 81 (06/13 0733) Resp:  [18-19] 19 (06/13 0733) BP: (112-127)/(66-71) 112/69 (06/13 0733) SpO2:  [92 %-95 %] 93 % (06/13 0733)  Intake/Output from previous day: 06/12 0701 - 06/13 0700 In: 240 [P.O.:240] Out: -   Labs: No results for input(s): WBC, RBC, HCT, PLT in the last 72 hours. No results for input(s): NA, K, CL, CO2, BUN, CREATININE, GLUCOSE, CALCIUM  in the last 72 hours. No results for input(s): LABPT, INR in the last 72 hours.  Physical Exam: Neurologically intact Neurovascular intact Sensation intact distally Intact pulses distally Dorsiflexion/Plantar flexion intact Incision: dressing C/D/I No cellulitis present Compartment soft Body mass index is 31.62 kg/m.   Assessment/Plan: Assessment: Clifford is a very pleasant 73 year old female who is postop day 4 XLIF lumbar 2 to lumbar 4 and POD 3 from PSIF L2-L4. We were able to successfully extend the fixation to her existing L4-S1 instrumentation. Overall she is doing well and is stable.  Surgical intervention was successful and was without complications.  .  Patient states that her preoperative radicular leg pain is significantly improved.  She complains of surgical pian, her pain is much more well controlled today. She states that she is still having right sided pain on her body but it is not as severe as yesterday and is only with ROM.   This could be due to positioning during surgery or msk strain due to overcompensating.  There are no signs of any bruising on that side.  Patient does state that she is having some flank pain and some hip flexor pain.     Patient is voiding, positive flatus, positive BM. Tolerating oral intake well.  Dressing clean, dry, intact; we did change the posterior dressing today. Patient will mobilize with physical therapy.  Patient to continue incentive spirometry.   Plan: - Continue care - Mobilize with physical therapy - Continue pain medical management - Continue to utilize incentive spirometry -Plan for D/C to home possibly this afternoon--patient has been cleared by PT, they recommend no equipment and and recommend some help with walking   Jaydah Stahle PA-C for Mort Ards, MD [facility] [phone]

## 2024-04-07 NOTE — Discharge Summary (Signed)
 Patient ID: Carol Cox MRN: [id number] DOB/AGE: 07-22-1951 73 y.o.  Admit date: 04/03/2024 Discharge date: 04/07/2024  Admission Diagnoses:  Principal Problem:   S/P lumbar fusion   Discharge Diagnoses:  Principal Problem:   S/P lumbar fusion  status post Procedure(s): POSTERIOR SUPPLEMENTAL FUSION ,INSTRUMENTED LUMBAR TWO-TO LUMBAR THREE,LUMBAR THREE TO LUMBAR FOUR FUSION POSSIBLE LUMBAR TWO TO LUMBAR FOUR DECOMPRESSION  Past Medical History:  Diagnosis Date   Allergy    seasonal   Arthritis    knee, left hip, ankles    Borderline glaucoma    CAD (coronary artery disease)    Mild nonobstructive CAD by cath 02/06/13 (25% mid LAD), normal EF   Chronic low back pain    Colitis    Coronary arteriosclerosis    DDD (degenerative disc disease), lumbar    Degeneration of intervertebral disc of lumbar region    Family history of adverse reaction to anesthesia    mother has problems with nausea and vomiting    Glaucoma    History of colitis    02/ 2016  acute infectious colitis-- resolved   History of total knee arthroplasty    Hyperglycemia    Hyperlipidemia    Hypertension    Left ovarian cyst    Low back pain    with bilateral hip radiation    Lumbar radiculopathy    Lumbar spondylolysis    Obesity    Osteoporosis    ospenia of hips   Pain in joint of left shoulder    Pain of right hip joint    PONV (postoperative nausea and vomiting)    SEVERE    Surgeries: Procedure(s): POSTERIOR SUPPLEMENTAL FUSION ,INSTRUMENTED LUMBAR TWO-TO LUMBAR THREE,LUMBAR THREE TO LUMBAR FOUR FUSION POSSIBLE LUMBAR TWO TO LUMBAR FOUR DECOMPRESSION on 04/04/2024   Consultants:   Discharged Condition: Improved  Hospital Course: Carol Cox is an 73 y.o. female who was admitted 04/03/2024 for operative treatment of S/P lumbar fusion. Patient failed conservative treatments (please see the history and physical for the specifics) and had severe unremitting pain that affects sleep,  daily activities and work/hobbies. After pre-op clearance, the patient was taken to the operating room on 04/04/2024 and underwent  Procedure(s): POSTERIOR SUPPLEMENTAL FUSION ,INSTRUMENTED LUMBAR TWO-TO LUMBAR THREE,LUMBAR THREE TO LUMBAR FOUR FUSION POSSIBLE LUMBAR TWO TO LUMBAR FOUR DECOMPRESSION.    Patient was given perioperative antibiotics:  Anti-infectives (From admission, onward)    Start     Dose/Rate Route Frequency Ordered Stop   04/03/24 1400  ceFAZolin  (ANCEF ) IVPB 1 g/50 mL premix        1 g 100 mL/hr over 30 Minutes Intravenous Every 8 hours 04/03/24 1215 04/04/24 0821   04/03/24 0558  ceFAZolin  (ANCEF ) IVPB 2g/100 mL premix        2 g 200 mL/hr over 30 Minutes Intravenous 30 min pre-op 04/03/24 0558 04/03/24 0822   04/03/24 0558  ceFAZolin  (ANCEF ) IVPB 2g/100 mL premix  Status:  Discontinued        2 g 200 mL/hr over 30 Minutes Intravenous 30 min pre-op 04/03/24 0558 04/03/24 0600   04/03/24 0558  ceFAZolin  (ANCEF ) IVPB 2g/100 mL premix  Status:  Discontinued        2 g 200 mL/hr over 30 Minutes Intravenous 30 min pre-op 04/03/24 0558 04/03/24 0600   04/03/24 0558  ceFAZolin  (ANCEF ) IVPB 2g/100 mL premix  Status:  Discontinued        2 g 200 mL/hr over 30 Minutes Intravenous 30 min pre-op 04/03/24  6045 04/03/24 0600        Patient was given sequential compression devices and early ambulation to prevent DVT.   Patient benefited maximally from hospital stay and there were no complications. At the time of discharge, the patient was urinating/moving their bowels without difficulty, tolerating a regular diet, pain is controlled with oral pain medications and they have been cleared by PT/OT.   Recent vital signs: Patient Vitals for the past 24 hrs:  BP Temp Temp src Pulse Resp SpO2  04/07/24 0733 112/69 98.8 F (37.1 C) Oral 81 19 93 %  04/07/24 0444 119/71 98.4 F (36.9 C) Oral 80 18 95 %  04/06/24 2259 116/67 99.3 F (37.4 C) Oral 81 18 94 %  04/06/24 2024 124/67  98.2 F (36.8 C) Oral 85 18 95 %  04/06/24 1352 127/66 98.8 F (37.1 C) Oral 84 18 92 %  04/06/24 0756 115/66 99 F (37.2 C) Oral 88 18 93 %     Recent laboratory studies: No results for input(s): WBC, HGB, HCT, PLT, NA, K, CL, CO2, BUN, CREATININE, GLUCOSE, INR, CALCIUM  in the last 72 hours.  Invalid input(s): PT, 2   Discharge Medications:   Allergies as of 04/07/2024       Reactions   Atorvastatin Other (See Comments)   Leg pain   Rosuvastatin  Other (See Comments)   Leg pain        Medication List     STOP taking these medications    diclofenac 75 MG EC tablet Commonly known as: VOLTAREN   Fish Oil 1000 MG Caps   meloxicam  7.5 MG tablet Commonly known as: Mobic    MultiVitamin + Fluoride 0.25 MG Chew   traMADol  50 MG tablet Commonly known as: ULTRAM        TAKE these medications    aspirin  EC 81 MG tablet Take 81 mg by mouth daily.   Calcium  200 MG Tabs Take 200 mg by mouth daily.   dorzolamide -timolol  2-0.5 % ophthalmic solution Commonly known as: COSOPT  Place 1 drop into both eyes in the morning and at bedtime.   ezetimibe  10 MG tablet Commonly known as: ZETIA  TAKE 1 TABLET BY MOUTH EVERY DAY   lisinopril  20 MG tablet Commonly known as: ZESTRIL  TAKE 1 TABLET BY MOUTH EVERY DAY   methocarbamol  500 MG tablet Commonly known as: ROBAXIN  Take 1 tablet (500 mg total) by mouth every 8 (eight) hours as needed for up to 5 days for muscle spasms.   ondansetron  4 MG tablet Commonly known as: Zofran  Take 1 tablet (4 mg total) by mouth every 8 (eight) hours as needed for nausea or vomiting.   oxyCODONE -acetaminophen  10-325 MG tablet Commonly known as: Percocet Take 1 tablet by mouth every 6 (six) hours as needed for up to 5 days for pain.   psyllium 58.6 % powder Commonly known as: METAMUCIL Take 1 packet by mouth as needed (constipation).   Repatha  SureClick 140 MG/ML Soaj Generic drug: Evolocumab  INJECT 1 PEN  INTO THE SKIN EVERY 14 (FOURTEEN) DAYS.   Travoprost (BAK Free) 0.004 % Soln ophthalmic solution Commonly known as: TRAVATAN Place 1 drop into both eyes at bedtime.   Vitamin D3 50 MCG (2000 UT) capsule Take 2,000 Units by mouth daily.        Diagnostic Studies: DG Lumbar Spine 2-3 Views Result Date: 04/04/2024 CLINICAL DATA:  Surgical posterior fusion of lumbar spine. EXAM: LUMBAR SPINE - 2-3 VIEW; DG C-ARM 1-60 MIN-NO REPORT Radiation exposure index: 74.95 mGy. COMPARISON:  April 03, 2024. FINDINGS: Five intraoperative fluoroscopic images were obtained during interval surgical posterior fusion of L2-3 and L3-4 with intrapedicular screw placement. IMPRESSION: Fluoroscopic guidance provided during surgical posterior fusion of L2-3 and L3-4. Electronically Signed   By: Rosalene Colon M.D.   On: 04/04/2024 16:33   DG C-Arm 1-60 Min-No Report Result Date: 04/04/2024 Fluoroscopy was utilized by the requesting physician.  No radiographic interpretation.   DG C-Arm 1-60 Min-No Report Result Date: 04/04/2024 Fluoroscopy was utilized by the requesting physician.  No radiographic interpretation.   DG C-Arm 1-60 Min-No Report Result Date: 04/04/2024 Fluoroscopy was utilized by the requesting physician.  No radiographic interpretation.   DG Lumbar Spine 2-3 Views Result Date: 04/03/2024 CLINICAL DATA:  [id number] Elective surgery [id number]. EXAM: LUMBAR SPINE - 2-3 VIEW COMPARISON:  None Available. FINDINGS: The provided image demonstrates lower lumbar spinal fusion hardware. There are transpedicular screws/rods and intervertebral disc spacers. Please refer to the separately issued operative report for further details. IMPRESSION: *Intraoperative fluoroscopy as above. Electronically Signed   By: Beula Brunswick M.D.   On: 04/03/2024 10:07   DG C-Arm 1-60 Min-No Report Result Date: 04/03/2024 Fluoroscopy was utilized by the requesting physician.  No radiographic interpretation.   DG C-Arm 1-60 Min-No  Report Result Date: 04/03/2024 Fluoroscopy was utilized by the requesting physician.  No radiographic interpretation.       Follow-up Information     Mort Ards, MD. Schedule an appointment as soon as possible for a visit in 2 week(s).   Specialty: Orthopedic Surgery Why: If symptoms worsen, For suture removal, For wound re-check Contact information: [address] [place] [address] [phone]                 Discharge Plan:  discharge to home today   Disposition:  Carol Cox is a very pleasant 73 year old female who is postop day 4 XLIF lumbar 2 to lumbar 4 and POD 3 from PSIF L2-L4. We were able to successfully extend the fixation to her existing L4-S1 instrumentation. Overall she is doing well and is stable.  Surgical intervention was successful and was without complications.  .  Patient states that her preoperative radicular leg pain is significantly improved.  She complains of surgical pian, her pain is much more well controlled today. She states that she is still having right sided pain on her body but it is not as severe as yesterday and is only with ROM.  This could be due to positioning during surgery or msk strain due to overcompensating.  There are no signs of any bruising on that side.  Patient does state that she is having some flank pain and some hip flexor pain.     Patient is voiding, positive flatus, positive BM. Tolerating oral intake well.  Dressing clean, dry, intact; we did change the posterior dressing today. Patient will mobilize with physical therapy.  Patient to continue incentive spirometry.  Patient to be d/c to home today after working with PT this morning    Signed: [place] Due for Dr. Mort Ards [facility] [phone] 04/07/2024, 7:43 AM

## 2024-04-17 NOTE — Addendum Note (Signed)
 Addendum  created 04/17/24 2018 by Jefm Garnette LABOR, MD   Clinical Note Signed, Intraprocedure Blocks edited, SmartForm saved

## 2024-04-18 ENCOUNTER — Encounter (HOSPITAL_COMMUNITY): Payer: Self-pay | Admitting: Orthopedic Surgery

## 2024-04-18 NOTE — Addendum Note (Signed)
 Addendum  created 04/18/24 1250 by Epifanio Fallow, MD   Intraprocedure Event edited, Intraprocedure Staff edited

## 2024-04-19 NOTE — Progress Notes (Signed)
 [facility] Population [facility] post TCM follow up  Date of call: 04/19/2024  Discharged from: [facility]  Updates/Changes since last encounter:   Current Questions/Concerns: HN phone call with pt's husband. He reports pt had f/u with Dr. Burnetta yesterday and has no concerns.   Is patient candidate for Navigation: n/a  Augustin Saupe, RN, [employer] [phone]   Electronically signed by: Augustin Saupe, RN 04/19/2024 12:17 PM

## 2024-04-20 ENCOUNTER — Ambulatory Visit: Admitting: Internal Medicine

## 2024-04-25 ENCOUNTER — Inpatient Hospital Stay (HOSPITAL_COMMUNITY)
Admission: EM | Admit: 2024-04-25 | Discharge: 2024-04-27 | DRG: 176 | Disposition: A | Discharge status: Home / Self Care | Attending: Internal Medicine | Admitting: Internal Medicine

## 2024-04-25 ENCOUNTER — Encounter (HOSPITAL_COMMUNITY): Payer: Self-pay | Admitting: Emergency Medicine

## 2024-04-25 ENCOUNTER — Emergency Department (HOSPITAL_COMMUNITY)

## 2024-04-25 ENCOUNTER — Other Ambulatory Visit: Payer: Self-pay

## 2024-04-25 DIAGNOSIS — I82402 Acute embolism and thrombosis of unspecified deep veins of left lower extremity: Secondary | ICD-10-CM | POA: Diagnosis not present

## 2024-04-25 DIAGNOSIS — I10 Essential (primary) hypertension: Secondary | ICD-10-CM | POA: Diagnosis present

## 2024-04-25 DIAGNOSIS — I2692 Saddle embolus of pulmonary artery without acute cor pulmonale: Secondary | ICD-10-CM | POA: Diagnosis not present

## 2024-04-25 DIAGNOSIS — K219 Gastro-esophageal reflux disease without esophagitis: Secondary | ICD-10-CM | POA: Diagnosis present

## 2024-04-25 DIAGNOSIS — I82462 Acute embolism and thrombosis of left calf muscular vein: Secondary | ICD-10-CM | POA: Diagnosis present

## 2024-04-25 DIAGNOSIS — Z7982 Long term (current) use of aspirin: Secondary | ICD-10-CM

## 2024-04-25 DIAGNOSIS — Z888 Allergy status to other drugs, medicaments and biological substances status: Secondary | ICD-10-CM

## 2024-04-25 DIAGNOSIS — I82412 Acute embolism and thrombosis of left femoral vein: Secondary | ICD-10-CM | POA: Diagnosis present

## 2024-04-25 DIAGNOSIS — I2699 Other pulmonary embolism without acute cor pulmonale: Secondary | ICD-10-CM | POA: Diagnosis not present

## 2024-04-25 DIAGNOSIS — Z823 Family history of stroke: Secondary | ICD-10-CM

## 2024-04-25 DIAGNOSIS — E785 Hyperlipidemia, unspecified: Secondary | ICD-10-CM | POA: Diagnosis present

## 2024-04-25 DIAGNOSIS — I2510 Atherosclerotic heart disease of native coronary artery without angina pectoris: Secondary | ICD-10-CM | POA: Diagnosis present

## 2024-04-25 DIAGNOSIS — Z96652 Presence of left artificial knee joint: Secondary | ICD-10-CM | POA: Diagnosis present

## 2024-04-25 DIAGNOSIS — M810 Age-related osteoporosis without current pathological fracture: Secondary | ICD-10-CM | POA: Diagnosis present

## 2024-04-25 DIAGNOSIS — Z981 Arthrodesis status: Secondary | ICD-10-CM

## 2024-04-25 DIAGNOSIS — E66811 Obesity, class 1: Secondary | ICD-10-CM | POA: Diagnosis present

## 2024-04-25 DIAGNOSIS — Z79899 Other long term (current) drug therapy: Secondary | ICD-10-CM

## 2024-04-25 DIAGNOSIS — Z7901 Long term (current) use of anticoagulants: Secondary | ICD-10-CM

## 2024-04-25 DIAGNOSIS — Z6830 Body mass index (BMI) 30.0-30.9, adult: Secondary | ICD-10-CM

## 2024-04-25 DIAGNOSIS — Z8249 Family history of ischemic heart disease and other diseases of the circulatory system: Secondary | ICD-10-CM

## 2024-04-25 LAB — COMPREHENSIVE METABOLIC PANEL WITH GFR
ALT: 18 U/L (ref 0–44)
AST: 21 U/L (ref 15–41)
Albumin: 3.2 g/dL — ABNORMAL LOW (ref 3.5–5.0)
Alkaline Phosphatase: 89 U/L (ref 38–126)
Anion gap: 11 (ref 5–15)
BUN: 28 mg/dL — ABNORMAL HIGH (ref 8–23)
CO2: 25 mmol/L (ref 22–32)
Calcium: 10.2 mg/dL (ref 8.9–10.3)
Chloride: 101 mmol/L (ref 98–111)
Creatinine, Ser: 0.93 mg/dL (ref 0.44–1.00)
GFR, Estimated: >60 mL/min (ref >60)
Glucose, Bld: 119 mg/dL — ABNORMAL HIGH (ref 70–99)
Potassium: 4.5 mmol/L (ref 3.5–5.1)
Sodium: 137 mmol/L (ref 135–145)
Total Bilirubin: 0.6 mg/dL (ref 0.0–1.2)
Total Protein: 7.9 g/dL (ref 6.5–8.1)

## 2024-04-25 LAB — URINALYSIS, ROUTINE W REFLEX MICROSCOPIC
Bilirubin Urine: NEGATIVE
Glucose, UA: NEGATIVE mg/dL
Ketones, ur: 5 mg/dL — AB
Leukocytes,Ua: NEGATIVE
Nitrite: NEGATIVE
Protein, ur: 30 mg/dL — AB
pH: 5 (ref 5.0–8.0)

## 2024-04-25 LAB — CBC
HCT: 39.7 % (ref 36.0–46.0)
Hemoglobin: 12.6 g/dL (ref 12.0–15.0)
MCH: 31 pg (ref 26.0–34.0)
MCHC: 31.7 g/dL (ref 30.0–36.0)
MCV: 97.8 fL (ref 80.0–100.0)
Platelets: 244 10*3/uL (ref 150–400)
RBC: 4.06 MIL/uL (ref 3.87–5.11)
RDW: 12.4 % (ref 11.5–15.5)
WBC: 10.7 10*3/uL — ABNORMAL HIGH (ref 4.0–10.5)
nRBC: 0 % (ref 0.0–0.2)

## 2024-04-25 LAB — HEPARIN LEVEL (UNFRACTIONATED): Heparin Unfractionated: 0.57 [IU]/mL (ref 0.30–0.70)

## 2024-04-25 LAB — TROPONIN I (HIGH SENSITIVITY): Troponin I (High Sensitivity): 93 ng/L — ABNORMAL HIGH (ref <18)

## 2024-04-25 MED ORDER — ACETAMINOPHEN 650 MG RE SUPP: 650.0000 mg | Freq: Four times a day (QID) | RECTAL | Status: DC | PRN

## 2024-04-25 MED ORDER — DORZOLAMIDE HCL-TIMOLOL MAL 2-0.5 % OP SOLN
1.0000 [drp] | Freq: Two times a day (BID) | OPHTHALMIC | Status: DC
  Administered 2024-04-26: 1 [drp] via OPHTHALMIC
  Filled 2024-04-25: qty 10

## 2024-04-25 MED ORDER — OXYCODONE-ACETAMINOPHEN 5-325 MG PO TABS
1.0000 | ORAL_TABLET | Freq: Three times a day (TID) | ORAL | Status: DC | PRN
  Administered 2024-04-25 – 2024-04-27 (×4): 1 via ORAL
  Filled 2024-04-25 (×5): qty 1

## 2024-04-25 MED ORDER — ONDANSETRON HCL 4 MG PO TABS: 4.0000 mg | ORAL_TABLET | Freq: Four times a day (QID) | ORAL | Status: DC | PRN

## 2024-04-25 MED ORDER — FENTANYL CITRATE PF 50 MCG/ML IJ SOSY
12.5000 ug | PREFILLED_SYRINGE | Freq: Once | INTRAMUSCULAR | Status: AC
  Administered 2024-04-25: 12.5 ug via INTRAVENOUS
  Filled 2024-04-25: qty 1

## 2024-04-25 MED ORDER — LATANOPROST 0.005 % OP SOLN: 1.0000 [drp] | Freq: Every day | OPHTHALMIC | Status: DC

## 2024-04-25 MED ORDER — DIAZEPAM 5 MG PO TABS: 5.0000 mg | ORAL_TABLET | Freq: Three times a day (TID) | ORAL | Status: DC | PRN

## 2024-04-25 MED ORDER — HEPARIN (PORCINE) 25000 UT/250ML-% IV SOLN
1200.0000 [IU]/h | INTRAVENOUS | Status: DC
  Administered 2024-04-25 – 2024-04-27 (×3): 1200 [IU]/h via INTRAVENOUS
  Filled 2024-04-25 (×3): qty 250

## 2024-04-25 MED ORDER — ONDANSETRON HCL 4 MG/2ML IJ SOLN: 4.0000 mg | Freq: Four times a day (QID) | INTRAMUSCULAR | Status: DC | PRN

## 2024-04-25 MED ORDER — OXYCODONE HCL 5 MG PO TABS
5.0000 mg | ORAL_TABLET | Freq: Three times a day (TID) | ORAL | Status: DC | PRN
  Administered 2024-04-26 (×2): 5 mg via ORAL
  Filled 2024-04-25 (×3): qty 1

## 2024-04-25 MED ORDER — EZETIMIBE 10 MG PO TABS
10.0000 mg | ORAL_TABLET | Freq: Every day | ORAL | Status: DC
  Administered 2024-04-25 – 2024-04-27 (×3): 10 mg via ORAL
  Filled 2024-04-25 (×3): qty 1

## 2024-04-25 MED ORDER — ACETAMINOPHEN 325 MG PO TABS: 650.0000 mg | ORAL_TABLET | Freq: Four times a day (QID) | ORAL | Status: DC | PRN

## 2024-04-25 MED ORDER — IOHEXOL 350 MG/ML SOLN
75.0000 mL | Freq: Once | INTRAVENOUS | Status: AC | PRN
  Administered 2024-04-25: 75 mL via INTRAVENOUS

## 2024-04-25 MED ORDER — OXYCODONE-ACETAMINOPHEN 10-325 MG PO TABS: 1.0000 | ORAL_TABLET | Freq: Three times a day (TID) | ORAL | Status: DC | PRN

## 2024-04-25 MED ORDER — HEPARIN BOLUS VIA INFUSION
4500.0000 [IU] | Freq: Once | INTRAVENOUS | Status: AC
  Administered 2024-04-25: 4500 [IU] via INTRAVENOUS

## 2024-04-25 MED ORDER — METHOCARBAMOL 500 MG PO TABS
500.0000 mg | ORAL_TABLET | Freq: Three times a day (TID) | ORAL | Status: DC | PRN
  Administered 2024-04-25: 500 mg via ORAL
  Filled 2024-04-25: qty 1

## 2024-04-25 NOTE — H&P (Addendum)
 History and Physical    Patient: Carol Cox FMW:[id number] DOB: 1951-03-09 DOA: 04/25/2024 DOS: the patient was seen and examined on 04/25/2024 PCP: Tanda Prentice DEL, MD  Patient coming from: Home  Chief Complaint:  Chief Complaint  Patient presents with   Leg Swelling   HPI: Carol Cox is a 73 year old female with a history of hypertension and hyperlipidemia presenting with about 1 week history of dyspnea on exertion.  The patient subsequently noted some swelling and pain in her left leg on the morning of 04/25/2024. Notably, the patient underwent L2-L4 lumbar fusion on 04/03/2024.  On 04/04/2024, the patient went back to the OR for extension of the fusion to L2, with pedicle screw placement L2 and L3, and L2-L4 arthrodesis.  The patient was discharged home in stable condition. The patient denies any fevers, chills, headache, chest pain, coughing, hemoptysis, nausea, vomiting, diarrhea or abdominal pain.  She denies any hematuria, hemoptysis, hematochezia, melena.  In the ED, the patient was afebrile and hemodynamically stable with oxygen saturation 95% on room air. WBC 10.7, hemoglobin 12.6, platelet 244.  Sodium 137, potassium 4.5, bicarbonate 25, serum creatinine 0.93.  LFTs were unremarkable.  CTA chest showed large amount of acute pulmonary embolus bilateral.  There is a small amount of nonocclusive saddle embolus in the main pulmonary artery.  There is RV strain with RV LV ratio 1.31.  There was a RLL subpleural opacity suspicious for pulmonary infarct.  Venous duplex of the left lower extremity was positive for DVT extending from the common femoral vein to the calf veins.  The patient was started on IV heparin .   Review of Systems: As mentioned in the history of present illness. All other systems reviewed and are negative. Past Medical History:  Diagnosis Date   Allergy    seasonal   Arthritis    knee, left hip, ankles    Borderline glaucoma    CAD (coronary artery disease)     Mild nonobstructive CAD by cath 02/06/13 (25% mid LAD), normal EF   Chronic low back pain    Colitis    Coronary arteriosclerosis    DDD (degenerative disc disease), lumbar    Degeneration of intervertebral disc of lumbar region    Family history of adverse reaction to anesthesia    mother has problems with nausea and vomiting    Glaucoma    History of colitis    02/ 2016  acute infectious colitis-- resolved   History of total knee arthroplasty    Hyperglycemia    Hyperlipidemia    Hypertension    Left ovarian cyst    Low back pain    with bilateral hip radiation    Lumbar radiculopathy    Lumbar spondylolysis    Obesity    Osteoporosis    ospenia of hips   Pain in joint of left shoulder    Pain of right hip joint    PONV (postoperative nausea and vomiting)    SEVERE   Past Surgical History:  Procedure Laterality Date   ABDOMINAL EXPOSURE N/A 05/01/2020   Procedure: ABDOMINAL EXPOSURE;  Surgeon: Oris Krystal FALCON, MD;  Location: [facility] OR;  Service: Vascular;  Laterality: N/A;   ANTERIOR LAT LUMBAR FUSION N/A 05/01/2020   Procedure: ANTERIOR LATERAL LUMBAR FUSION L4-S1;  Surgeon: [name], MD;  Location: [facility] OR;  Service: Orthopedics;  Laterality: N/A;  4.5 hrs Dr. Oris to do approach tap block with exparel    ANTERIOR LAT LUMBAR FUSION N/A 04/03/2024  Procedure: EXTREME LATERAL INTERBODY FUSION LUMBAR TWO - THREE TO LUMBAR THREE - FOUR;  Surgeon: [name], MD;  Location: [facility] OR;  Service: Orthopedics;  Laterality: N/A;  XLIF L2-3, L3-4   CARPAL TUNNEL RELEASE Bilateral 1990's   COLONOSCOPY     KNEE ARTHROSCOPY W/ MENISCECTOMY Left 07/2014   LEFT HEART CATHETERIZATION WITH CORONARY ANGIOGRAM N/A 02/06/2013   Procedure: LEFT HEART CATHETERIZATION WITH CORONARY ANGIOGRAM;  Surgeon: Deatrice DELENA Cage, MD;  Location: [facility] CATH LAB;  Service: Cardiovascular;  Laterality: N/A;  non-obstruction 25% mLAD,  normal LVF , ef 65-70%   NASAL SINUS SURGERY     OVARIAN CYST REMOVAL Left     ROTATOR CUFF REPAIR Right 10/2016   TOTAL KNEE ARTHROPLASTY Left 03/26/2015   Procedure: LEFT TOTAL KNEE ARTHROPLASTY;  Surgeon: Donnice Car, MD;  Location: [facility] ORS;  Service: Orthopedics;  Laterality: Left;   Social History:  reports that she has never smoked. She has never used smokeless tobacco. She reports that she does not drink alcohol and does not use drugs.  Allergies  Allergen Reactions   Atorvastatin Other (See Comments)    Leg pain   Rosuvastatin  Other (See Comments)    Leg pain    Family History  Problem Relation Age of Onset   CVA Mother    Sudden death Father 35       Presumed heart attack   Heart attack Father    Heart attack Sister 67   CAD Sister    CAD Maternal Uncle 72   CAD Maternal Uncle 50   Heart attack Maternal Uncle 42   Heart attack Maternal Uncle 50   Colon cancer Neg Hx    Colon polyps Neg Hx    Esophageal cancer Neg Hx    Rectal cancer Neg Hx    Stomach cancer Neg Hx     Prior to Admission medications   Medication Sig Start Date End Date Taking? Authorizing Provider  aspirin  EC 81 MG tablet Take 81 mg by mouth Cox.   Yes [provider]  Calcium  200 MG TABS Take 200 mg by mouth Cox.   Yes [provider]  Cholecalciferol (VITAMIN D3) 50 MCG (2000 UT) capsule Take 2,000 Units by mouth Cox.   Yes [provider]  diazepam (VALIUM) 10 MG tablet Take 5 mg by mouth every 8 (eight) hours as needed for anxiety or sleep. 04/18/24  Yes [provider]  diclofenac (VOLTAREN) 75 MG EC tablet Take by mouth 2 (two) times Cox. 04/12/24  Yes [provider]  dorzolamide -timolol  (COSOPT ) 22.3-6.8 MG/ML ophthalmic solution Place 1 drop into both eyes in the morning and at bedtime. 04/09/20  Yes [provider]  ezetimibe  (ZETIA ) 10 MG tablet TAKE 1 TABLET BY MOUTH EVERY DAY 01/17/24  Yes [name], MD  lisinopril  (ZESTRIL ) 20 MG tablet TAKE 1 TABLET BY MOUTH EVERY DAY 01/17/24  Yes [name], MD  meloxicam  (MOBIC ) 15 MG tablet .5 tablet Orally bid prn 04/13/24  Yes [provider]  methocarbamol  (ROBAXIN ) 500 MG tablet Take 500 mg by mouth every 8 (eight) hours as needed for muscle spasms. For 14 days. 04/08/24  Yes [provider]  Omega-3 Fatty Acids (FISH OIL) 1000 MG CAPS Take 1 capsule by mouth Cox.   Yes [provider]  ondansetron  (ZOFRAN ) 8 MG tablet Take 8 mg by mouth every 8 (eight) hours as needed for nausea or vomiting. 04/08/24  Yes [provider]  PERCOCET 10-325  MG tablet Take 1 tablet by mouth every 8 (eight) hours as needed for pain. 04/08/24  Yes [provider]  psyllium (METAMUCIL) 58.6 % powder Take 1 packet by mouth as needed (constipation).   Yes [provider]  Travoprost, BAK Free, (TRAVATAN) 0.004 % SOLN ophthalmic solution Place 1 drop into both eyes at bedtime.    Yes [provider]  cetirizine (ZYRTEC ALLERGY) 10 MG tablet Take 10 mg by mouth Cox as needed for allergies.    [provider]  Evolocumab  (REPATHA  SURECLICK) 140 MG/ML SOAJ INJECT 1 PEN INTO THE SKIN EVERY 14 (FOURTEEN) DAYS. Patient not taking: Reported on 04/25/2024 05/10/23   [name], MD  ezetimibe -simvastatin  (VYTORIN ) 10-40 MG tablet 1 tablet Orally Once a day Patient not taking: Reported on 04/25/2024    [provider]    Physical Exam: Vitals:   04/25/24 1415 04/25/24 1500 04/25/24 1515 04/25/24 1517  BP: 113/70 121/62 125/82   Pulse: 83 85 95   Resp: 16 17    Temp:    98.3 F (36.8 C)  TempSrc:    Oral  SpO2: 95% 98% 93%   Weight:      Height:       GENERAL:  A&O x 3, NAD, well developed, cooperative, follows commands HEENT: [place]/AT, No thrush, No icterus, No oral ulcers Neck:  No neck mass, No meningismus, soft, supple CV: RRR, no S3, no S4, no rub, no JVD Lungs: Bibasilar crackles but no wheezing Abd: soft/NT +BS, nondistended Ext: 1 + LLE edema, no lymphangitis, no  cyanosis, no rashes +pedal pulse Neuro:  CN II-XII intact, strength 4/5 in RUE, RLE, strength 4/5 LUE, LLE; sensation intact bilateral; no dysmetria; babinski equivocal; compartments are soft.  Posterior tibial pulse on the left is palpable; incision in the back and left flank without any edema or drainage  Data Reviewed: Data reviewed above in history  Assessment and Plan: Acute pulmonary embolus/Submassive PE - provoked by clinical history - CTA suggests RV strain with RV:LV ratio 1.31 - pt is stable on RA and hemodynamically stable -start IV heparin  -echo -check lupus anticoagulant, factor V Leiden, prothrombin gene mutation  Essential HTN -holding lisinopril   Hyperlipidemia -continue zetia   S/p Lumbar fusion -continue percocet and robaxin    Advance Care Planning: FULL  Consults: none  Family Communication: spouse 7/1  Severity of Illness: The appropriate patient status for this patient is OBSERVATION. Observation status is judged to be reasonable and necessary in order to provide the required intensity of service to ensure the patient's safety. The patient's presenting symptoms, physical exam findings, and initial radiographic and laboratory data in the context of their medical condition is felt to place them at decreased risk for further clinical deterioration. Furthermore, it is anticipated that the patient will be medically stable for discharge from the hospital within 2 midnights of admission.   Author: Alm Schneider, MD 04/25/2024 3:28 PM  For on call review [email or web address].

## 2024-04-25 NOTE — Progress Notes (Signed)
 PHARMACY - ANTICOAGULATION CONSULT NOTE  Pharmacy Consult for Heparin  Indication: DVT  Allergies  Allergen Reactions   Atorvastatin Other (See Comments)    Leg pain   Rosuvastatin  Other (See Comments)    Leg pain    Patient Measurements: Height: 5' 5 (165.1 cm) Weight: 86.2 kg (190 lb) IBW/kg (Calculated) : 57 HEPARIN  DW (KG): 75.7  Vital Signs: Temp: 98.3 F (36.8 C) (07/01 1050) Temp Source: Oral (07/01 1050) BP: 110/74 (07/01 1050) Pulse Rate: 87 (07/01 1050)  Labs: Recent Labs    04/25/24 1250  HGB 12.6  HCT 39.7  PLT 244    CrCl cannot be calculated (Patient's most recent lab result is older than the maximum 21 days allowed.).  Assessment: 73 year old female s/p recent surgery on the 9th and 10th of June, admitted with left leg pain/swelling - US  confirmed extensive acute occlusive DVT through left lower extremity from the common femoral into the calf veins. To initiate systemic heparin . No contraindications noted  Goal of Therapy:  Heparin  level 0.3-0.7 units/ml Monitor platelets by anticoagulation protocol: Yes   Plan:  Give 4500 units bolus x 1 Start heparin  infusion at 1200 units/hr Check anti-Xa level in 8 hours and daily while on heparin  Continue to monitor H&H and platelets  Gregorio Cara, PharmD Clinical Pharmacist 04/25/2024 1:21 PM    ;v

## 2024-04-25 NOTE — Progress Notes (Signed)
 PHARMACY - ANTICOAGULATION CONSULT NOTE  Pharmacy Consult for Heparin  Indication: DVT  Allergies  Allergen Reactions   Atorvastatin Other (See Comments)    Leg pain   Rosuvastatin  Other (See Comments)    Leg pain    Patient Measurements: Height: 5' 5 (165.1 cm) Weight: 82.5 kg (181 lb 14.1 oz) IBW/kg (Calculated) : 57 HEPARIN  DW (KG): 74.6  Vital Signs: Temp: 99.1 F (37.3 C) (07/01 2002) Temp Source: Oral (07/01 2002) BP: 109/67 (07/01 2002) Pulse Rate: 88 (07/01 2002)  Labs: Recent Labs    04/25/24 1250 04/25/24 1957  HGB 12.6  --   HCT 39.7  --   PLT 244  --   HEPARINUNFRC  --  0.57  CREATININE 0.93  --   TROPONINIHS 93*  --     Estimated Creatinine Clearance: 58 mL/min (by C-G formula based on SCr of 0.93 mg/dL).  Assessment: 73 year old female s/p recent surgery on the 9th and 10th of June, admitted with left leg pain/swelling - US  confirmed extensive acute occlusive DVT through left lower extremity from the common femoral into the calf veins. To initiate systemic heparin . No contraindications noted  Date Time HL Rate/Comment 07/01 1957 0.57 Therapeutic x1  Goal of Therapy:  Heparin  level 0.3-0.7 units/ml Monitor platelets by anticoagulation protocol: Yes   Plan:  Continue heparin  infusion at 1200 units/hour Check heparin  level in 8 hours to confirm Monitor CBC and signs/symptoms of bleeding  Thank you for involving pharmacy in this patient's care.   Damien Napoleon, PharmD Clinical Pharmacist 04/25/2024 8:37 PM

## 2024-04-25 NOTE — ED Triage Notes (Signed)
 Pt arrived via [facility] c/o L leg pain, swelling, and redness. Also endorses SHOB. Recently had surgery on 6/9 and 6/10 and her back. Pt also c/o lower back pain where incision site is located and generalized abd pain. Rates pain 5/10. L leg is visibly swollen and some discoloration noted

## 2024-04-25 NOTE — Hospital Course (Signed)
 73 year old female with a history of hypertension and hyperlipidemia presenting with about 1 week history of dyspnea on exertion.  The patient subsequently noted some swelling and pain in her left leg on the morning of 04/25/2024. Notably, the patient underwent L2-L4 lumbar fusion on 04/03/2024.  On 04/04/2024, the patient went back to the OR for extension of the fusion to L2, with pedicle screw placement L2 and L3, and L2-L4 arthrodesis.  The patient was discharged home in stable condition. The patient denies any fevers, chills, headache, chest pain, coughing, hemoptysis, nausea, vomiting, diarrhea or abdominal pain.  She denies any hematuria, hemoptysis, hematochezia, melena.  In the ED, the patient was afebrile and hemodynamically stable with oxygen saturation 95% on room air. WBC 10.7, hemoglobin 12.6, platelet 244.  Sodium 137, potassium 4.5, bicarbonate 25, serum creatinine 0.93.  LFTs were unremarkable.  CTA chest showed large amount of acute pulmonary embolus bilateral.  There is a small amount of nonocclusive saddle embolus in the main pulmonary artery.  There is RV strain with RV LV ratio 1.31.  There was a RLL subpleural opacity suspicious for pulmonary infarct.  Venous duplex of the left lower extremity was positive for DVT extending from the common femoral vein to the calf veins.  The patient was started on IV heparin .

## 2024-04-25 NOTE — ED Provider Notes (Signed)
 [place] EMERGENCY DEPARTMENT AT [facility] Provider Note   CSN: [id number] Arrival date & time: 04/25/24  1031     Patient presents with: Leg Swelling   Carol Cox is a 73 y.o. female.   Pt s/p 2 day staged lumbar disc procedure/fusion 6/9 and 6/10, presents c/o general weakness, fatigue, doe, in past few days. Also c/o left leg swelling. No hx dvt or pe. No current anticoagulation. No chest pain or discomfort. No cough or uri symptoms. No fever or chills. No new redness, pain or swelling at incisional sites.   The history is provided by the patient, medical records and the EMS personnel.       Prior to Admission medications   Medication Sig Start Date End Date Taking? Authorizing Provider  aspirin  EC 81 MG tablet Take 81 mg by mouth daily.    [provider]  Calcium  200 MG TABS Take 200 mg by mouth daily.    [provider]  Cholecalciferol (VITAMIN D3) 50 MCG (2000 UT) capsule Take 2,000 Units by mouth daily.    [provider]  dorzolamide -timolol  (COSOPT ) 22.3-6.8 MG/ML ophthalmic solution Place 1 drop into both eyes in the morning and at bedtime. 04/09/20   [provider]  Evolocumab  (REPATHA  SURECLICK) 140 MG/ML SOAJ INJECT 1 PEN INTO THE SKIN EVERY 14 (FOURTEEN) DAYS. 05/10/23   [name], MD  ezetimibe  (ZETIA ) 10 MG tablet TAKE 1 TABLET BY MOUTH EVERY DAY 01/17/24   [name], MD  lisinopril  (ZESTRIL ) 20 MG tablet TAKE 1 TABLET BY MOUTH EVERY DAY 01/17/24   [name], MD  ondansetron  (ZOFRAN ) 4 MG tablet Take 1 tablet (4 mg total) by mouth every 8 (eight) hours as needed for nausea or vomiting. 04/04/24   [name], MD  psyllium (METAMUCIL) 58.6 % powder Take 1 packet by mouth as needed (constipation).    [provider]  Travoprost, BAK Free, (TRAVATAN) 0.004 % SOLN ophthalmic solution Place 1 drop into both eyes at bedtime.     [provider]    Allergies:  Atorvastatin and Rosuvastatin     Review of Systems  Constitutional:  Negative for chills and fever.  HENT:  Negative for sore throat.   Respiratory:  Negative for cough.   Cardiovascular:  Positive for leg swelling. Negative for chest pain.  Gastrointestinal:  Negative for abdominal pain, blood in stool, diarrhea and vomiting.  Genitourinary:  Negative for dysuria and flank pain.  Musculoskeletal:  Negative for neck pain and neck stiffness.  Neurological:  Negative for weakness, numbness and headaches.  Hematological:  Does not bruise/bleed easily.    Updated Vital Signs BP 113/70   Pulse 83   Temp 98.3 F (36.8 C) (Oral)   Resp 16   Ht 1.651 m (5' 5)   Wt 86.2 kg   LMP  (LMP Unknown)   SpO2 95%   BMI 31.62 kg/m   Physical Exam Vitals and nursing note reviewed.  Constitutional:      Appearance: Normal appearance. She is well-developed.  HENT:     Head: Atraumatic.     Nose: Nose normal.     Mouth/Throat:     Mouth: Mucous membranes are moist.     Pharynx: Oropharynx is clear.   Eyes:     General: No scleral icterus.    Conjunctiva/sclera: Conjunctivae normal.     Pupils: Pupils are equal, round, and reactive to light.   Neck:     Trachea: No  tracheal deviation.     Comments: No stiffness or rigidity. Trachea midline.  Cardiovascular:     Rate and Rhythm: Normal rate and regular rhythm.     Pulses: Normal pulses.     Heart sounds: Normal heart sounds. No murmur heard.    No friction rub. No gallop.  Pulmonary:     Effort: Pulmonary effort is normal. No respiratory distress.     Breath sounds: Normal breath sounds.  Abdominal:     General: Bowel sounds are normal. There is no distension.     Palpations: Abdomen is soft. There is no mass.     Tenderness: There is no abdominal tenderness. There is no guarding.  Genitourinary:    Comments: No cva tenderness.   Musculoskeletal:        General: No tenderness.     Cervical back: Normal range of motion and neck  supple. No rigidity. No muscular tenderness.     Comments: LLE swelling. Distal pulses palp. Compartments of LLE are soft, not tense, not woody.  Incision to left flank and lower back healing without sign of infection.    Skin:    General: Skin is warm and dry.     Findings: No rash.   Neurological:     Mental Status: She is alert.     Comments: Alert, speech normal. Motor/sens grossly intact bil.   Psychiatric:        Mood and Affect: Mood normal.     (all labs ordered are listed, but only abnormal results are displayed) Results for orders placed or performed during the hospital encounter of 04/25/24  Comprehensive metabolic panel with GFR   Collection Time: 04/25/24 12:50 PM  Result Value Ref Range   Sodium 137 135 - 145 mmol/L   Potassium 4.5 3.5 - 5.1 mmol/L   Chloride 101 98 - 111 mmol/L   CO2 25 22 - 32 mmol/L   Glucose, Bld 119 (H) 70 - 99 mg/dL   BUN 28 (H) 8 - 23 mg/dL   Creatinine, Ser 9.06 0.44 - 1.00 mg/dL   Calcium  10.2 8.9 - 10.3 mg/dL   Total Protein 7.9 6.5 - 8.1 g/dL   Albumin  3.2 (L) 3.5 - 5.0 g/dL   AST 21 15 - 41 U/L   ALT 18 0 - 44 U/L   Alkaline Phosphatase 89 38 - 126 U/L   Total Bilirubin 0.6 0.0 - 1.2 mg/dL   GFR, Estimated >39 >39 mL/min   Anion gap 11 5 - 15  CBC   Collection Time: 04/25/24 12:50 PM  Result Value Ref Range   WBC 10.7 (H) 4.0 - 10.5 K/uL   RBC 4.06 3.87 - 5.11 MIL/uL   Hemoglobin 12.6 12.0 - 15.0 g/dL   HCT 60.2 63.9 - 53.9 %   MCV 97.8 80.0 - 100.0 fL   MCH 31.0 26.0 - 34.0 pg   MCHC 31.7 30.0 - 36.0 g/dL   RDW 87.5 88.4 - 84.4 %   Platelets 244 150 - 400 K/uL   nRBC 0.0 0.0 - 0.2 %  Troponin I (High Sensitivity)   Collection Time: 04/25/24 12:50 PM  Result Value Ref Range   Troponin I (High Sensitivity) 93 (H) <18 ng/L   CT Angio Chest PE W and/or Wo Contrast Result Date: 04/25/2024 CLINICAL DATA:  Pulmonary embolism (PE) suspected, high prob r/o PE Left leg pain, swelling and erythema. Shortness of breath. Recent back  surgery. Acute left lower extremity DVT on Doppler ultrasound. EXAM: CT ANGIOGRAPHY  CHEST WITH CONTRAST TECHNIQUE: Multidetector CT imaging of the chest was performed using the standard protocol during bolus administration of intravenous contrast. Multiplanar CT image reconstructions and MIPs were obtained to evaluate the vascular anatomy. RADIATION DOSE REDUCTION: This exam was performed according to the departmental dose-optimization program which includes automated exposure control, adjustment of the mA and/or kV according to patient size and/or use of iterative reconstruction technique. CONTRAST:  75mL OMNIPAQUE  IOHEXOL  350 MG/ML SOLN COMPARISON:  Lower extremity Doppler ultrasound same date. Chest radiographs 11/24/2022. Cardiac CT 03/01/2023. FINDINGS: Cardiovascular: The pulmonary arteries are well opacified with contrast to the level of the segmental branches. There is a large amount of acute pulmonary thromboembolic disease bilaterally. There is a small nonocclusive saddle embolus within the main pulmonary arteries. There are partially occlusive emboli within the lower lobes bilaterally. The heart size is normal, although there is relative dilatation of the right atrium relative to the right ventricle (RV to LV ratio 1.31) suspicious for acute right heart strain. No significant pericardial fluid. No evidence of acute systemic arterial abnormality. Mediastinum/Nodes: There are no enlarged mediastinal, hilar or axillary lymph nodes. The thyroid gland, trachea and esophagus demonstrate no significant findings. Lungs/Pleura: No significant pleural effusion or pneumothorax. New subpleural opacity in the right lower lobe on image 61/6, suspicious for pulmonary infarct. There is mildly increased atelectasis or scarring at the left lung base. Upper abdomen: Mild hepatic steatosis. No acute findings demonstrated within the visualized upper abdomen. Musculoskeletal/Chest wall: There is no chest wall mass or  suspicious osseous finding. Multilevel spondylosis. Review of the MIP images confirms the above findings. IMPRESSION: 1. Large amount of acute pulmonary thromboembolic disease bilaterally with small nonocclusive saddle embolus in the main pulmonary arteries. 2. Evidence of acute right heart strain (RV/LV Ratio = 1.31) consistent with at least submassive (intermediate risk) PE. The presence of right heart strain has been associated with an increased risk of morbidity and mortality. Please refer to the Code PE Focused order set in EPIC. 3. New subpleural opacity in the right lower lobe suspicious for pulmonary infarct. 4. Mild hepatic steatosis. 5. Critical Value/emergent results were called by telephone at the time of interpretation on 04/25/2024 at 2:20 pm to provider Sunya Humbarger , who verbally acknowledged these results. Electronically Signed   By: Elsie Perone M.D.   On: 04/25/2024 14:21   US  Venous Img Lower  Left (DVT Study) Result Date: 04/25/2024 CLINICAL DATA:  Left lower extremity pain and swelling EXAM: LEFT LOWER EXTREMITY VENOUS DOPPLER ULTRASOUND TECHNIQUE: Gray-scale sonography with graded compression, as well as color Doppler and duplex ultrasound were performed to evaluate the lower extremity deep venous systems from the level of the common femoral vein and including the common femoral, femoral, profunda femoral, popliteal and calf veins including the posterior tibial, peroneal and gastrocnemius veins when visible. The superficial great saphenous vein was also interrogated. Spectral Doppler was utilized to evaluate flow at rest and with distal augmentation maneuvers in the common femoral, femoral and popliteal veins. COMPARISON:  None Available. FINDINGS: Contralateral Common Femoral Vein: Respiratory phasicity is normal and symmetric with the symptomatic side. No evidence of thrombus. Normal compressibility. Common Femoral Vein: Noncompressible. No evidence of color flow on color Doppler  imaging. The lumen is expanded and filled with low-level internal echoes. Findings are consistent with acute occlusive DVT. Saphenofemoral Junction: Acute occlusive DVT extends into the saphenofemoral junction. Profunda Femoral Vein: Acute occlusive DVT extends into the profunda femoral vein. Femoral Vein: Acute occlusive DVT extends throughout the  femoral vein in the thigh and into the popliteal vein. Popliteal Vein: Acute occlusive DVT throughout the popliteal vein. Calf Veins: Acute occlusive DVT extends into 1 of the posterior tibial veins. The other posterior tibial vein in the peroneal veins remain patent. Superficial Great Saphenous Vein: No evidence of thrombus. Normal compressibility. Venous Reflux:  None. Other Findings:  None. IMPRESSION: Positive for extensive acute occlusive DVT throughout the left lower extremity from the common femoral vein into the calf veins. Electronically Signed   By: Wilkie Lent M.D.   On: 04/25/2024 12:20   DG Lumbar Spine 2-3 Views Result Date: 04/04/2024 CLINICAL DATA:  Surgical posterior fusion of lumbar spine. EXAM: LUMBAR SPINE - 2-3 VIEW; DG C-ARM 1-60 MIN-NO REPORT Radiation exposure index: 74.95 mGy. COMPARISON:  April 03, 2024. FINDINGS: Five intraoperative fluoroscopic images were obtained during interval surgical posterior fusion of L2-3 and L3-4 with intrapedicular screw placement. IMPRESSION: Fluoroscopic guidance provided during surgical posterior fusion of L2-3 and L3-4. Electronically Signed   By: Lynwood Landy Raddle M.D.   On: 04/04/2024 16:33   DG C-Arm 1-60 Min-No Report Result Date: 04/04/2024 Fluoroscopy was utilized by the requesting physician.  No radiographic interpretation.   DG C-Arm 1-60 Min-No Report Result Date: 04/04/2024 Fluoroscopy was utilized by the requesting physician.  No radiographic interpretation.   DG C-Arm 1-60 Min-No Report Result Date: 04/04/2024 Fluoroscopy was utilized by the requesting physician.  No radiographic  interpretation.   DG Lumbar Spine 2-3 Views Result Date: 04/03/2024 CLINICAL DATA:  461500 Elective surgery 461500. EXAM: LUMBAR SPINE - 2-3 VIEW COMPARISON:  None Available. FINDINGS: The provided image demonstrates lower lumbar spinal fusion hardware. There are transpedicular screws/rods and intervertebral disc spacers. Please refer to the separately issued operative report for further details. IMPRESSION: *Intraoperative fluoroscopy as above. Electronically Signed   By: Ree Molt M.D.   On: 04/03/2024 10:07   DG C-Arm 1-60 Min-No Report Result Date: 04/03/2024 Fluoroscopy was utilized by the requesting physician.  No radiographic interpretation.   DG C-Arm 1-60 Min-No Report Result Date: 04/03/2024 Fluoroscopy was utilized by the requesting physician.  No radiographic interpretation.    EKG: EKG Interpretation Date/Time:  Tuesday April 25 2024 12:51:11 EDT Ventricular Rate:  84 PR Interval:  154 QRS Duration:  77 QT Interval:  351 QTC Calculation: 415 R Axis:   14  Text Interpretation: Sinus rhythm No significant change since last tracing Confirmed by Bernard Drivers ([id number]) on 04/25/2024 2:03:27 PM  Radiology: CT Angio Chest PE W and/or Wo Contrast Result Date: 04/25/2024 CLINICAL DATA:  Pulmonary embolism (PE) suspected, high prob r/o PE Left leg pain, swelling and erythema. Shortness of breath. Recent back surgery. Acute left lower extremity DVT on Doppler ultrasound. EXAM: CT ANGIOGRAPHY CHEST WITH CONTRAST TECHNIQUE: Multidetector CT imaging of the chest was performed using the standard protocol during bolus administration of intravenous contrast. Multiplanar CT image reconstructions and MIPs were obtained to evaluate the vascular anatomy. RADIATION DOSE REDUCTION: This exam was performed according to the departmental dose-optimization program which includes automated exposure control, adjustment of the mA and/or kV according to patient size and/or use of iterative reconstruction  technique. CONTRAST:  75mL OMNIPAQUE  IOHEXOL  350 MG/ML SOLN COMPARISON:  Lower extremity Doppler ultrasound same date. Chest radiographs 11/24/2022. Cardiac CT 03/01/2023. FINDINGS: Cardiovascular: The pulmonary arteries are well opacified with contrast to the level of the segmental branches. There is a large amount of acute pulmonary thromboembolic disease bilaterally. There is a small nonocclusive saddle embolus within the main pulmonary  arteries. There are partially occlusive emboli within the lower lobes bilaterally. The heart size is normal, although there is relative dilatation of the right atrium relative to the right ventricle (RV to LV ratio 1.31) suspicious for acute right heart strain. No significant pericardial fluid. No evidence of acute systemic arterial abnormality. Mediastinum/Nodes: There are no enlarged mediastinal, hilar or axillary lymph nodes. The thyroid gland, trachea and esophagus demonstrate no significant findings. Lungs/Pleura: No significant pleural effusion or pneumothorax. New subpleural opacity in the right lower lobe on image 61/6, suspicious for pulmonary infarct. There is mildly increased atelectasis or scarring at the left lung base. Upper abdomen: Mild hepatic steatosis. No acute findings demonstrated within the visualized upper abdomen. Musculoskeletal/Chest wall: There is no chest wall mass or suspicious osseous finding. Multilevel spondylosis. Review of the MIP images confirms the above findings. IMPRESSION: 1. Large amount of acute pulmonary thromboembolic disease bilaterally with small nonocclusive saddle embolus in the main pulmonary arteries. 2. Evidence of acute right heart strain (RV/LV Ratio = 1.31) consistent with at least submassive (intermediate risk) PE. The presence of right heart strain has been associated with an increased risk of morbidity and mortality. Please refer to the Code PE Focused order set in EPIC. 3. New subpleural opacity in the right lower lobe  suspicious for pulmonary infarct. 4. Mild hepatic steatosis. 5. Critical Value/emergent results were called by telephone at the time of interpretation on 04/25/2024 at 2:20 pm to provider Jasmeet Gehl , who verbally acknowledged these results. Electronically Signed   By: Elsie Perone M.D.   On: 04/25/2024 14:21   US  Venous Img Lower  Left (DVT Study) Result Date: 04/25/2024 CLINICAL DATA:  Left lower extremity pain and swelling EXAM: LEFT LOWER EXTREMITY VENOUS DOPPLER ULTRASOUND TECHNIQUE: Gray-scale sonography with graded compression, as well as color Doppler and duplex ultrasound were performed to evaluate the lower extremity deep venous systems from the level of the common femoral vein and including the common femoral, femoral, profunda femoral, popliteal and calf veins including the posterior tibial, peroneal and gastrocnemius veins when visible. The superficial great saphenous vein was also interrogated. Spectral Doppler was utilized to evaluate flow at rest and with distal augmentation maneuvers in the common femoral, femoral and popliteal veins. COMPARISON:  None Available. FINDINGS: Contralateral Common Femoral Vein: Respiratory phasicity is normal and symmetric with the symptomatic side. No evidence of thrombus. Normal compressibility. Common Femoral Vein: Noncompressible. No evidence of color flow on color Doppler imaging. The lumen is expanded and filled with low-level internal echoes. Findings are consistent with acute occlusive DVT. Saphenofemoral Junction: Acute occlusive DVT extends into the saphenofemoral junction. Profunda Femoral Vein: Acute occlusive DVT extends into the profunda femoral vein. Femoral Vein: Acute occlusive DVT extends throughout the femoral vein in the thigh and into the popliteal vein. Popliteal Vein: Acute occlusive DVT throughout the popliteal vein. Calf Veins: Acute occlusive DVT extends into 1 of the posterior tibial veins. The other posterior tibial vein in the peroneal  veins remain patent. Superficial Great Saphenous Vein: No evidence of thrombus. Normal compressibility. Venous Reflux:  None. Other Findings:  None. IMPRESSION: Positive for extensive acute occlusive DVT throughout the left lower extremity from the common femoral vein into the calf veins. Electronically Signed   By: Wilkie Lent M.D.   On: 04/25/2024 12:20     Procedures   Medications Ordered in the ED  heparin  ADULT infusion 100 units/mL (25000 units/250mL) (1,200 Units/hr Intravenous New Bag/Given 04/25/24 1420)  heparin  bolus via infusion 4,500 Units (  4,500 Units Intravenous Bolus from Bag 04/25/24 1421)  iohexol  (OMNIPAQUE ) 350 MG/ML injection 75 mL (75 mLs Intravenous Contrast Given 04/25/24 1357)                                    Medical Decision Making Problems Addressed: Acute deep vein thrombosis (DVT) of left lower extremity, unspecified vein (HCC): acute illness or injury with systemic symptoms that poses a threat to life or bodily functions Acute pulmonary embolism, unspecified pulmonary embolism type, unspecified whether acute cor pulmonale present [facility]): acute illness or injury with systemic symptoms that poses a threat to life or bodily functions  Amount and/or Complexity of Data Reviewed Independent Historian: EMS    Details: hx External Data Reviewed: notes. Labs: ordered. Decision-making details documented in ED Course. Radiology: ordered and independent interpretation performed. Decision-making details documented in ED Course. ECG/medicine tests: ordered and independent interpretation performed. Decision-making details documented in ED Course. Discussion of management or test interpretation with external provider(s): hospitalists  Risk Prescription drug management. Decision regarding hospitalization.   Iv ns. Continuous pulse ox and cardiac monitoring. Labs ordered/sent. Imaging ordered.   Differential diagnosis includes PE, pna, anemia, dehydration, dvt, etc.  Dispo decision including potential need for admission considered - will get labs and imaging and reassess.   Reviewed nursing notes and prior charts for additional history. External reports reviewed. Additional history from: EMS.  CTA ordered.   Cardiac monitor: sinus rhythm, rate 87.  Labs reviewed/interpreted by me - wbc 10, hgb 12, chem unremarkable.   U/s reviewed/interpreted by me  - +DVT.  CT reviewed/interpreted by me -  +bil PE.   Heparin  bolus/gtt per pharmacy.  Recheck pt, stable, no distress. Vitals normal.   Hospitalists consulted for admission.  CRITICAL CARE RE: bilateral pulmonary embolism, dvt.  Performed by: Ajiah Mcglinn E Maralee Higuchi Total critical care time: 45 minutes Critical care time was exclusive of separately billable procedures and treating other patients. Critical care was necessary to treat or prevent imminent or life-threatening deterioration. Critical care was time spent personally by me on the following activities: development of treatment plan with patient and/or surrogate as well as nursing, discussions with consultants, evaluation of patient's response to treatment, examination of patient, obtaining history from patient or surrogate, ordering and performing treatments and interventions, ordering and review of laboratory studies, ordering and review of radiographic studies, pulse oximetry and re-evaluation of patient's condition.       Final diagnoses:  Acute pulmonary embolism, unspecified pulmonary embolism type, unspecified whether acute cor pulmonale present (HCC)  Acute deep vein thrombosis (DVT) of left lower extremity, unspecified vein [facility])    ED Discharge Orders     None          Bernard Drivers, MD 04/25/24 1433

## 2024-04-26 ENCOUNTER — Other Ambulatory Visit (HOSPITAL_COMMUNITY): Payer: Self-pay | Admitting: *Deleted

## 2024-04-26 ENCOUNTER — Observation Stay (HOSPITAL_COMMUNITY)

## 2024-04-26 DIAGNOSIS — I2609 Other pulmonary embolism with acute cor pulmonale: Secondary | ICD-10-CM | POA: Diagnosis not present

## 2024-04-26 DIAGNOSIS — I82402 Acute embolism and thrombosis of unspecified deep veins of left lower extremity: Secondary | ICD-10-CM | POA: Diagnosis not present

## 2024-04-26 DIAGNOSIS — I2699 Other pulmonary embolism without acute cor pulmonale: Secondary | ICD-10-CM | POA: Diagnosis not present

## 2024-04-26 LAB — CBC
HCT: 38.7 % (ref 36.0–46.0)
Hemoglobin: 12.2 g/dL (ref 12.0–15.0)
MCH: 30.8 pg (ref 26.0–34.0)
MCHC: 31.5 g/dL (ref 30.0–36.0)
MCV: 97.7 fL (ref 80.0–100.0)
Platelets: 230 10*3/uL (ref 150–400)
RBC: 3.96 MIL/uL (ref 3.87–5.11)
RDW: 12.5 % (ref 11.5–15.5)
WBC: 10.9 10*3/uL — ABNORMAL HIGH (ref 4.0–10.5)
nRBC: 0 % (ref 0.0–0.2)

## 2024-04-26 LAB — BASIC METABOLIC PANEL WITH GFR
Anion gap: 14 (ref 5–15)
BUN: 32 mg/dL — ABNORMAL HIGH (ref 8–23)
CO2: 22 mmol/L (ref 22–32)
Calcium: 10 mg/dL (ref 8.9–10.3)
Chloride: 102 mmol/L (ref 98–111)
Creatinine, Ser: 0.97 mg/dL (ref 0.44–1.00)
GFR, Estimated: >60 mL/min (ref >60)
Glucose, Bld: 105 mg/dL — ABNORMAL HIGH (ref 70–99)
Potassium: 4.1 mmol/L (ref 3.5–5.1)
Sodium: 138 mmol/L (ref 135–145)

## 2024-04-26 LAB — ECHOCARDIOGRAM COMPLETE
Area-P 1/2: 2.73 cm2
Height: 65 in
S' Lateral: 2.2 cm
Weight: 2910.07 [oz_av]

## 2024-04-26 LAB — TROPONIN I (HIGH SENSITIVITY)
Troponin I (High Sensitivity): 25 ng/L — ABNORMAL HIGH (ref <18)
Troponin I (High Sensitivity): 19 ng/L — ABNORMAL HIGH (ref <18)

## 2024-04-26 LAB — MAGNESIUM: Magnesium: 2.2 mg/dL (ref 1.7–2.4)

## 2024-04-26 LAB — HEPARIN LEVEL (UNFRACTIONATED): Heparin Unfractionated: 0.56 [IU]/mL (ref 0.30–0.70)

## 2024-04-26 NOTE — Plan of Care (Signed)
   Problem: Health Behavior/Discharge Planning: Goal: Ability to manage health-related needs will improve Outcome: Progressing   Problem: Clinical Measurements: Goal: Ability to maintain clinical measurements within normal limits will improve Outcome: Progressing

## 2024-04-26 NOTE — Plan of Care (Signed)
  Problem: Pain Managment: Goal: General experience of comfort will improve and/or be controlled Outcome: Progressing   Problem: Safety: Goal: Ability to remain free from injury will improve Outcome: Progressing   Problem: Skin Integrity: Goal: Risk for impaired skin integrity will decrease Outcome: Progressing

## 2024-04-26 NOTE — Progress Notes (Signed)
*  PRELIMINARY RESULTS* Echocardiogram 2D Echocardiogram has been performed.  Carol Cox 04/26/2024, 3:45 PM

## 2024-04-26 NOTE — Progress Notes (Signed)
 PHARMACY - ANTICOAGULATION CONSULT NOTE  Pharmacy Consult for Heparin  Indication: DVT  Allergies  Allergen Reactions   Atorvastatin Other (See Comments)    Leg pain   Rosuvastatin  Other (See Comments)    Leg pain    Patient Measurements: Height: 5' 5 (165.1 cm) Weight: 82.5 kg (181 lb 14.1 oz) IBW/kg (Calculated) : 57 HEPARIN  DW (KG): 74.6  Vital Signs: Temp: 99 F (37.2 C) (07/02 0404) Temp Source: Oral (07/02 0404) BP: 106/69 (07/02 0404) Pulse Rate: 80 (07/02 0404)  Labs: Recent Labs    04/25/24 1250 04/25/24 1957 04/26/24 0439  HGB 12.6  --  12.2  HCT 39.7  --  38.7  PLT 244  --  230  HEPARINUNFRC  --  0.57 0.56  CREATININE 0.93  --  0.97  TROPONINIHS 93*  --   --     Estimated Creatinine Clearance: 55.6 mL/min (by C-G formula based on SCr of 0.97 mg/dL).  Assessment: 73 year old female s/p recent surgery on the 9th and 10th of June, admitted with left leg pain/swelling - US  confirmed extensive acute occlusive DVT through left lower extremity from the common femoral into the calf veins. To initiate systemic heparin . No contraindications noted  HL 0.56- therapeutic  Goal of Therapy:  Heparin  level 0.3-0.7 units/ml Monitor platelets by anticoagulation protocol: Yes   Plan:  Continue heparin  infusion at 1200 units/hour Check heparin  level daily Monitor CBC and signs/symptoms of bleeding  Thank you for involving pharmacy in this patient's care.   Elspeth Sour, PharmD Clinical Pharmacist 04/26/2024 7:53 AM

## 2024-04-26 NOTE — Progress Notes (Signed)
 04/26/2024 Discussed case with primary, imaging reviewed. Echo has bad windows for RV function. Large clot burden, initial trop 90. Would check trop, if downtrending reasonable to send home on [facility] alone in AM. If still up can offer thrombectomy for faster resolution of symptoms. Data in this area is still lacking unfortunately.  Rolan Sharps MD PCCM

## 2024-04-26 NOTE — Progress Notes (Signed)
 Progress Note   Patient: Carol Cox FMW:[id number] DOB: 09/17/1951 DOA: 04/25/2024     0 DOS: the patient was seen and examined on 04/26/2024   Brief hospital admission narrative: As per H&P written by Dr. Evonnie on 04/25/2024 Carol Cox is a 73 year old female with a history of hypertension and hyperlipidemia presenting with about 1 week history of dyspnea on exertion.  The patient subsequently noted some swelling and pain in her left leg on the morning of 04/25/2024. Notably, the patient underwent L2-L4 lumbar fusion on 04/03/2024.  On 04/04/2024, the patient went back to the OR for extension of the fusion to L2, with pedicle screw placement L2 and L3, and L2-L4 arthrodesis.  The patient was discharged home in stable condition. The patient denies any fevers, chills, headache, chest pain, coughing, hemoptysis, nausea, vomiting, diarrhea or abdominal pain.  She denies any hematuria, hemoptysis, hematochezia, melena.   In the ED, the patient was afebrile and hemodynamically stable with oxygen saturation 95% on room air. WBC 10.7, hemoglobin 12.6, platelet 244.  Sodium 137, potassium 4.5, bicarbonate 25, serum creatinine 0.93.  LFTs were unremarkable.  CTA chest showed large amount of acute pulmonary embolus bilateral.  There is a small amount of nonocclusive saddle embolus in the main pulmonary artery.  There is RV strain with RV LV ratio 1.31.  There was a RLL subpleural opacity suspicious for pulmonary infarct.  Venous duplex of the left lower extremity was positive for DVT extending from the common femoral vein to the calf veins.  The patient was started on IV heparin .  Assessment and plan 1-acute provoked pulmonary embolism/submassive PE - Continue IV heparin  - 2D echo demonstrating moderate pulmonary artery pressure; patient hemodynamically stable and demonstrating good saturation on room air.  No dizziness, lightheadedness or near syncope symptoms. - Follow-up results of lupus anticoagulant,  factor V Leiden and prothrombin gene mutation - Case discussed with pulmonology service who recommended repeat troponin and if level trending down/improvement safe to discharge home on oral anticoagulation and outpatient follow-up.  If patient's troponin trending up transferred to [facility] for thrombectomy could be an option.  2-status post lumbar fusion - Continue the use of Percocet and Robaxin  - Continue outpatient follow-up with orthopedic service.  3-GERD/GI prophylaxis - Continue PPI  4-hypertension - Continue to follow vital signs - Currently holding lisinopril  - Heart healthy/low-sodium diet discussed with patient.  5-hyperlipidemia/dyslipidemia - Continue Zetia    Subjective:  Afebrile; no chest pain and demonstrating good oxygen saturation and a stable vital signs.  Reporting feeling short winded with activity.  Physical Exam: Vitals:   04/25/24 2002 04/25/24 2355 04/26/24 0404 04/26/24 1356  BP: 109/67 (!) 107/59 106/69 104/69  Pulse: 88 76 80 81  Resp: 18 16 16 20   Temp: 99.1 F (37.3 C) 98.3 F (36.8 C) 99 F (37.2 C) 98.5 F (36.9 C)  TempSrc: Oral Oral Oral Oral  SpO2: 100% 94% 94% 95%  Weight:      Height:       General exam: Alert, awake, oriented x 3; patient denies chest pain, lightheadedness, dizziness or near syncope symptoms.  Reporting feeling short winded with activity. Respiratory system: Good saturation on room air.  No using accessory muscles. Cardiovascular system:RRR. No murmurs, rubs, gallops. Gastrointestinal system: Abdomen is nondistended, soft and nontender. No organomegaly or masses felt. Normal bowel sounds heard. Central nervous system:  No focal neurological deficits. Extremities: No cyanosis or clubbing. Skin: No petechiae. Psychiatry: Judgement and insight appear normal. Mood & affect  appropriate.    Data Reviewed: CBC: WBCs 10.9, hemoglobin 12.21 platelet count 2 30K Coagulation panel: Pending Basic metabolic panel: Sodium  138, potassium 4.1, chloride 102, bicarb 22, BUN 32, creatinine 0.97, GFR>60 2D echo: Demonstrating preserved ejection fraction, no wall motion abnormality and moderate pulmonary artery pressure.  Right ventricle systolic function was normal.  Family Communication: Husband and sister-in-law at bedside.  Disposition: Status is: Observation The patient remains OBS appropriate and will d/c before 2 midnights.  Anticipating discharge back home once medically stable  Time spent: 50 minutes  Author: Eric Nunnery, MD 04/26/2024 7:12 PM  For on call review [email or web address].

## 2024-04-26 NOTE — TOC CM/SW Note (Signed)
 Transition of Care [facility]) - Inpatient Brief Assessment   Patient Details  Name: Carol Cox MRN: [id number] Date of Birth: 1951/04/15  [facility] CM/SW Contact:    Noreen KATHEE Pinal, LCSWA Phone Number: 04/26/2024, 10:00 AM   Clinical Narrative:  Transition of Care Department [facility]) has reviewed patient and no TOC needs have been identified at this time. We will continue to monitor patient advancement through interdisciplinary progression rounds. If new patient transition needs arise, please place a TOC consult.   Transition of Care Asessment: Insurance and Status: Insurance coverage has been reviewed Patient has primary care physician: Yes Home environment has been reviewed: Single Family Home with spouse Prior level of function:: Independent Prior/Current Home Services: No current home services Social Drivers of Health Review: SDOH reviewed no interventions necessary Readmission risk has been reviewed: Yes Transition of care needs: no transition of care needs at this time

## 2024-04-26 NOTE — Care Management Obs Status (Signed)
 MEDICARE OBSERVATION STATUS NOTIFICATION   Patient Details  Name: Carol Cox MRN: [id number] Date of Birth: 01-31-1951   Medicare Observation Status Notification Given:  Yes    Duwaine LITTIE Ada 04/26/2024, 10:21 AM

## 2024-04-27 ENCOUNTER — Telehealth (HOSPITAL_COMMUNITY): Payer: Self-pay

## 2024-04-27 ENCOUNTER — Other Ambulatory Visit (HOSPITAL_COMMUNITY): Payer: Self-pay

## 2024-04-27 DIAGNOSIS — Z7901 Long term (current) use of anticoagulants: Secondary | ICD-10-CM | POA: Diagnosis not present

## 2024-04-27 DIAGNOSIS — K219 Gastro-esophageal reflux disease without esophagitis: Secondary | ICD-10-CM | POA: Diagnosis present

## 2024-04-27 DIAGNOSIS — Z96652 Presence of left artificial knee joint: Secondary | ICD-10-CM | POA: Diagnosis present

## 2024-04-27 DIAGNOSIS — I10 Essential (primary) hypertension: Secondary | ICD-10-CM | POA: Diagnosis present

## 2024-04-27 DIAGNOSIS — Z7982 Long term (current) use of aspirin: Secondary | ICD-10-CM | POA: Diagnosis not present

## 2024-04-27 DIAGNOSIS — I82462 Acute embolism and thrombosis of left calf muscular vein: Secondary | ICD-10-CM | POA: Diagnosis present

## 2024-04-27 DIAGNOSIS — Z79899 Other long term (current) drug therapy: Secondary | ICD-10-CM | POA: Diagnosis not present

## 2024-04-27 DIAGNOSIS — I824Y2 Acute embolism and thrombosis of unspecified deep veins of left proximal lower extremity: Secondary | ICD-10-CM

## 2024-04-27 DIAGNOSIS — Z8249 Family history of ischemic heart disease and other diseases of the circulatory system: Secondary | ICD-10-CM | POA: Diagnosis not present

## 2024-04-27 DIAGNOSIS — Z823 Family history of stroke: Secondary | ICD-10-CM | POA: Diagnosis not present

## 2024-04-27 DIAGNOSIS — E66811 Obesity, class 1: Secondary | ICD-10-CM | POA: Diagnosis present

## 2024-04-27 DIAGNOSIS — I82412 Acute embolism and thrombosis of left femoral vein: Secondary | ICD-10-CM | POA: Diagnosis present

## 2024-04-27 DIAGNOSIS — I2510 Atherosclerotic heart disease of native coronary artery without angina pectoris: Secondary | ICD-10-CM | POA: Diagnosis present

## 2024-04-27 DIAGNOSIS — Z888 Allergy status to other drugs, medicaments and biological substances status: Secondary | ICD-10-CM | POA: Diagnosis not present

## 2024-04-27 DIAGNOSIS — M810 Age-related osteoporosis without current pathological fracture: Secondary | ICD-10-CM | POA: Diagnosis present

## 2024-04-27 DIAGNOSIS — Z981 Arthrodesis status: Secondary | ICD-10-CM | POA: Diagnosis not present

## 2024-04-27 DIAGNOSIS — E785 Hyperlipidemia, unspecified: Secondary | ICD-10-CM | POA: Diagnosis present

## 2024-04-27 DIAGNOSIS — Z6830 Body mass index (BMI) 30.0-30.9, adult: Secondary | ICD-10-CM | POA: Diagnosis not present

## 2024-04-27 DIAGNOSIS — I2699 Other pulmonary embolism without acute cor pulmonale: Secondary | ICD-10-CM | POA: Diagnosis present

## 2024-04-27 DIAGNOSIS — I2692 Saddle embolus of pulmonary artery without acute cor pulmonale: Secondary | ICD-10-CM | POA: Diagnosis present

## 2024-04-27 LAB — CBC
HCT: 36.7 % (ref 36.0–46.0)
Hemoglobin: 11.9 g/dL — ABNORMAL LOW (ref 12.0–15.0)
MCH: 31.8 pg (ref 26.0–34.0)
MCHC: 32.4 g/dL (ref 30.0–36.0)
MCV: 98.1 fL (ref 80.0–100.0)
Platelets: 232 10*3/uL (ref 150–400)
RBC: 3.74 MIL/uL — ABNORMAL LOW (ref 3.87–5.11)
RDW: 12.7 % (ref 11.5–15.5)
WBC: 9.2 10*3/uL (ref 4.0–10.5)
nRBC: 0 % (ref 0.0–0.2)

## 2024-04-27 LAB — HEPARIN LEVEL (UNFRACTIONATED): Heparin Unfractionated: 0.37 [IU]/mL (ref 0.30–0.70)

## 2024-04-27 MED ORDER — APIXABAN 5 MG PO TABS
10.0000 mg | ORAL_TABLET | Freq: Two times a day (BID) | ORAL | Status: DC
  Administered 2024-04-27: 10 mg via ORAL
  Filled 2024-04-27: qty 2

## 2024-04-27 MED ORDER — APIXABAN 5 MG PO TABS: 5.0000 mg | ORAL_TABLET | Freq: Two times a day (BID) | ORAL | Status: DC

## 2024-04-27 MED ORDER — APIXABAN 5 MG PO TABS: ORAL_TABLET | ORAL | 0 refills | Status: AC

## 2024-04-27 NOTE — Telephone Encounter (Signed)
 Pharmacy Patient Advocate Encounter  Insurance verification completed.    The patient is insured through [employer]. Patient has Medicare and is not eligible for a copay card, but may be able to apply for patient assistance or Medicare RX Payment Plan (Patient Must reach out to their plan, if eligible for payment plan), if available.    Ran test claim for Eliquis and the current 30 day co-pay is $40.00.   This test claim was processed through [facility]- copay amounts may vary at other pharmacies due to pharmacy/plan contracts, or as the patient moves through the different stages of their insurance plan.

## 2024-04-27 NOTE — Progress Notes (Signed)
 PHARMACY - ANTICOAGULATION CONSULT NOTE  Pharmacy Consult for Heparin  Indication: DVT  Allergies  Allergen Reactions   Atorvastatin Other (See Comments)    Leg pain   Rosuvastatin  Other (See Comments)    Leg pain    Patient Measurements: Height: 5' 5 (165.1 cm) Weight: 82.5 kg (181 lb 14.1 oz) IBW/kg (Calculated) : 57 HEPARIN  DW (KG): 74.6  Vital Signs: Temp: 97.5 F (36.4 C) (07/03 0352) Temp Source: Oral (07/03 0352) BP: 105/66 (07/03 0352) Pulse Rate: 91 (07/03 0352)  Labs: Recent Labs    04/25/24 1250 04/25/24 1957 04/26/24 0439 04/26/24 1739 04/26/24 1951 04/27/24 0437  HGB 12.6  --  12.2  --   --  11.9*  HCT 39.7  --  38.7  --   --  36.7  PLT 244  --  230  --   --  232  HEPARINUNFRC  --  0.57 0.56  --   --  0.37  CREATININE 0.93  --  0.97  --   --   --   TROPONINIHS 93*  --   --  25* 19*  --     Estimated Creatinine Clearance: 55.6 mL/min (by C-G formula based on SCr of 0.97 mg/dL).  Assessment: 73 year old female s/p recent surgery on the 9th and 10th of June, admitted with left leg pain/swelling - US  confirmed extensive acute occlusive DVT through left lower extremity from the common femoral into the calf veins. To initiate systemic heparin . No contraindications noted  HL 0.37- therapeutic CBC WNL  Goal of Therapy:  Heparin  level 0.3-0.7 units/ml Monitor platelets by anticoagulation protocol: Yes   Plan:  Continue heparin  infusion at 1200 units/hour Check heparin  level daily Monitor CBC and signs/symptoms of bleeding  Thank you for involving pharmacy in this patient's care.   Elspeth Sour, PharmD Clinical Pharmacist 04/27/2024 7:54 AM

## 2024-04-27 NOTE — Discharge Summary (Signed)
 Physician Discharge Summary   Patient: Carol Cox MRN: [id number] DOB: [birth year]  Admit date:     04/25/2024  Discharge date: 04/27/24  Discharge Physician: Eric Nunnery   PCP: Tanda Prentice DEL, MD   Recommendations at discharge:  Repeat basic metabolic panel to follow electrolytes and renal function Reassess blood pressure and adjust medication as needed Repeat CBC to follow hemoglobin trend/stability Continue assisting patient with weight loss management. Follow final results of patient's lupus anticoagulant, factor V Leyden and prothrombin gene mutation.  Discharge Diagnoses: Principal Problem:   Acute pulmonary embolism (HCC) Active Problems:   Acute deep vein thrombosis (DVT) of left lower extremity (HCC)   Pulmonary embolism [facility])  Brief hospital admission narrative: As per H&P written by Dr. Evonnie on 04/25/2024 Carol Cox is a 73 year old female with a history of hypertension and hyperlipidemia presenting with about 1 week history of dyspnea on exertion.  The patient subsequently noted some swelling and pain in her left leg on the morning of 04/25/2024. Notably, the patient underwent L2-L4 lumbar fusion on 04/03/2024.  On 04/04/2024, the patient went back to the OR for extension of the fusion to L2, with pedicle screw placement L2 and L3, and L2-L4 arthrodesis.  The patient was discharged home in stable condition. The patient denies any fevers, chills, headache, chest pain, coughing, hemoptysis, nausea, vomiting, diarrhea or abdominal pain.  She denies any hematuria, hemoptysis, hematochezia, melena.   In the ED, the patient was afebrile and hemodynamically stable with oxygen saturation 95% on room air. WBC 10.7, hemoglobin 12.6, platelet 244.  Sodium 137, potassium 4.5, bicarbonate 25, serum creatinine 0.93.  LFTs were unremarkable.  CTA chest showed large amount of acute pulmonary embolus bilateral.  There is a small amount of nonocclusive saddle embolus in the main pulmonary  artery.  There is RV strain with RV LV ratio 1.31.  There was a RLL subpleural opacity suspicious for pulmonary infarct.  Venous duplex of the left lower extremity was positive for DVT extending from the common femoral vein to the calf veins.  The patient was started on IV heparin .    Assessment and Plan: 1-acute provoked pulmonary embolism/submassive PE - Patient will be discharged home on Eliquis with anticipated length of therapy 6 months. - 2D echo demonstrating moderate pulmonary artery pressure; patient hemodynamically stable and demonstrating good saturation on room air.  No dizziness, lightheadedness or near syncope symptoms. - Follow-up final results of lupus anticoagulant, factor V Leiden and prothrombin gene mutation - Case discussed with pulmonology service who recommended repeat troponin and if level trending down/improvement safe to discharge home on oral anticoagulation; patient's troponin trending (93>> 25>> 19).   2-status post lumbar fusion - Continue the use of Percocet and Robaxin  - Continue outpatient follow-up with orthopedic service.   3-GERD/GI prophylaxis - Continue PPI   4-hypertension - Continue to follow vital signs - Currently holding lisinopril  - Heart healthy/low-sodium diet discussed with patient.   5-hyperlipidemia/dyslipidemia - Continue Zetia   6-class I obesity -Body mass index is 30.27 kg/m. -Low-calorie diet, portion control and increased activity discussed with patient.  Consultants: Cardiology and pulmonology were curbside Procedures performed: See below for x-ray reports Disposition: Home Diet recommendation: Heart healthy/low-sodium and low calorie diet.  DISCHARGE MEDICATION: Allergies as of 04/27/2024       Reactions   Atorvastatin Other (See Comments)   Leg pain   Rosuvastatin  Other (See Comments)   Leg pain        Medication List  STOP taking these medications    aspirin  EC 81 MG tablet   lisinopril  20 MG  tablet Commonly known as: ZESTRIL        TAKE these medications    acetaminophen  500 MG tablet Commonly known as: TYLENOL  Take 500 mg by mouth every 6 (six) hours as needed for mild pain (pain score 1-3).   apixaban 5 MG Tabs tablet Commonly known as: ELIQUIS Take 2 tablets (10 mg total) by mouth 2 (two) times daily for 7 days, THEN 1 tablet (5 mg total) 2 (two) times daily. Start taking on: April 27, 2024   Calcium  200 MG Tabs Take 200 mg by mouth daily.   dorzolamide -timolol  2-0.5 % ophthalmic solution Commonly known as: COSOPT  Place 1 drop into both eyes in the morning and at bedtime.   ezetimibe  10 MG tablet Commonly known as: ZETIA  TAKE 1 TABLET BY MOUTH EVERY DAY   Fish Oil 1000 MG Caps Take 1 capsule by mouth daily.   methocarbamol  500 MG tablet Commonly known as: ROBAXIN  Take 500 mg by mouth every 8 (eight) hours as needed for muscle spasms. For 14 days.   Percocet 10-325 MG tablet Generic drug: oxyCODONE -acetaminophen  Take 1 tablet by mouth every 8 (eight) hours as needed for pain.   psyllium 58.6 % powder Commonly known as: METAMUCIL Take 1 packet by mouth as needed (constipation).   Repatha  SureClick 140 MG/ML Soaj Generic drug: Evolocumab  INJECT 1 PEN INTO THE SKIN EVERY 14 (FOURTEEN) DAYS.   Travoprost (BAK Free) 0.004 % Soln ophthalmic solution Commonly known as: TRAVATAN Place 1 drop into both eyes at bedtime.   Valium 10 MG tablet Generic drug: diazepam Take 5 mg by mouth every 8 (eight) hours as needed for anxiety or sleep.   Vitamin D3 50 MCG (2000 UT) capsule Take 2,000 Units by mouth daily.        Follow-up Information     Tanda Prentice DEL, MD. Schedule an appointment as soon as possible for a visit in 10 day(s).   Specialty: Family Medicine Contact information: [address] [place] [address] [phone]                Discharge Exam: Fredricka Weights   04/25/24 1314 04/25/24 1317 04/25/24 1615  Weight: 86.2 kg  86.2 kg 82.5 kg   General exam: Alert, awake, oriented x 3; patient denies chest pain, lightheadedness, dizziness or near syncope symptoms.  Reporting feeling less short winded with activity. Respiratory system: Good saturation on room air.  No using accessory muscles. Cardiovascular system:RRR. No murmurs, rubs, gallops. Gastrointestinal system: Abdomen is nondistended, soft and nontender. No organomegaly or masses felt. Normal bowel sounds heard. Central nervous system:  No focal neurological deficits. Extremities: No cyanosis or clubbing.  Mild tenderness and swelling in her left lower extremity appreciated on exam. Skin: No petechiae. Psychiatry: Judgement and insight appear normal. Mood & affect appropriate.   Condition at discharge: Stable and improved.  The results of significant diagnostics from this hospitalization (including imaging, microbiology, ancillary and laboratory) are listed below for reference.   Imaging Studies: ECHOCARDIOGRAM COMPLETE Result Date: 04/26/2024    ECHOCARDIOGRAM REPORT   Patient Name:   LETICA GIAIMO Negrete Date of Exam: 04/26/2024 Medical Rec #:  [id number]       Height:       65.0 in Accession #:    [id number]      Weight:       181.9 lb Date of Birth:  [birth year]  BSA:          1.900 m Patient Age:    73 years        BP:           106/69 mmHg Patient Gender: F               HR:           80 bpm. Exam Location:  [facility] Procedure: 2D Echo, Cardiac Doppler and Color Doppler (Both Spectral and Color            Flow Doppler were utilized during procedure). Indications:    Pulmonary Embolus I26.09  History:        Patient has prior history of Echocardiogram examinations, most                 recent 03/31/2023. CAD; Risk Factors:Family History of Coronary                 Artery Disease and Dyslipidemia.  Sonographer:    Aida Pizza RCS Referring Phys: [id number] DAVID TAT IMPRESSIONS  1. Left ventricular ejection fraction, by estimation, is 60 to 65%. The left ventricle has  normal function. The left ventricle has no regional wall motion abnormalities. Left ventricular diastolic parameters are indeterminate.  2. Right ventricular systolic function is normal. The right ventricular size is normal. There is moderately elevated pulmonary artery systolic pressure. The estimated right ventricular systolic pressure is 46.3 mmHg.  3. The mitral valve is degenerative. Trivial mitral valve regurgitation.  4. Tricuspid valve regurgitation is moderate.  5. The aortic valve is tricuspid. Aortic valve regurgitation is not visualized. Aortic valve sclerosis is present, with no evidence of aortic valve stenosis.  6. The inferior vena cava is normal in size with greater than 50% respiratory variability, suggesting right atrial pressure of 3 mmHg. Comparison(s): Prior images reviewed side by side. LVEF 60-65% and RV contraction normal. Moderately elevated estimated RVSP. FINDINGS  Left Ventricle: Left ventricular ejection fraction, by estimation, is 60 to 65%. The left ventricle has normal function. The left ventricle has no regional wall motion abnormalities. The left ventricular internal cavity size was normal in size. There is  no left ventricular hypertrophy. Left ventricular diastolic parameters are indeterminate. Right Ventricle: The right ventricular size is normal. No increase in right ventricular wall thickness. Right ventricular systolic function is normal. There is moderately elevated pulmonary artery systolic pressure. The tricuspid regurgitant velocity is 3.29 m/s, and with an assumed right atrial pressure of 3 mmHg, the estimated right ventricular systolic pressure is 46.3 mmHg. Left Atrium: Left atrial size was normal in size. Right Atrium: Right atrial size was normal in size. Pericardium: There is no evidence of pericardial effusion. Presence of epicardial fat layer. Mitral Valve: The mitral valve is degenerative in appearance. Mild mitral annular calcification. Trivial mitral valve  regurgitation. Tricuspid Valve: The tricuspid valve is grossly normal. Tricuspid valve regurgitation is moderate. Aortic Valve: The aortic valve is tricuspid. There is mild aortic valve annular calcification. Aortic valve regurgitation is not visualized. Aortic valve sclerosis is present, with no evidence of aortic valve stenosis. Pulmonic Valve: The pulmonic valve was grossly normal. Pulmonic valve regurgitation is mild. Aorta: The aortic root is normal in size and structure. Venous: The inferior vena cava is normal in size with greater than 50% respiratory variability, suggesting right atrial pressure of 3 mmHg. IAS/Shunts: No atrial level shunt detected by color flow Doppler. Additional Comments: 3D was performed not requiring image post processing on  an independent workstation and was indeterminate.  LEFT VENTRICLE PLAX 2D LVIDd:         3.90 cm   Diastology LVIDs:         2.20 cm   LV e' medial:    9.57 cm/s LV PW:         0.70 cm   LV E/e' medial:  9.1 LV IVS:        0.80 cm   LV e' lateral:   11.50 cm/s LVOT diam:     1.90 cm   LV E/e' lateral: 7.6 LV SV:         66 LV SV Index:   35 LVOT Area:     2.84 cm  RIGHT VENTRICLE RV S prime:     15.00 cm/s TAPSE (M-mode): 2.0 cm LEFT ATRIUM             Index        RIGHT ATRIUM           Index LA diam:        2.70 cm 1.42 cm/m   RA Area:     12.70 cm LA Vol (A2C):   34.1 ml 17.95 ml/m  RA Volume:   25.70 ml  13.53 ml/m LA Vol (A4C):   25.5 ml 13.42 ml/m LA Biplane Vol: 29.6 ml 15.58 ml/m  AORTIC VALVE LVOT Vmax:   131.00 cm/s LVOT Vmean:  90.900 cm/s LVOT VTI:    0.233 m  AORTA Ao Root diam: 3.30 cm MITRAL VALVE                TRICUSPID VALVE MV Area (PHT): 2.73 cm     TR Peak grad:   43.3 mmHg MV Decel Time: 278 msec     TR Vmax:        329.00 cm/s MV E velocity: 87.50 cm/s MV A velocity: 126.00 cm/s  SHUNTS MV E/A ratio:  0.69         Systemic VTI:  0.23 m                             Systemic Diam: 1.90 cm Jayson Sierras MD Electronically signed by Jayson Sierras MD Signature Date/Time: 04/26/2024/4:40:21 PM    Final    CT Angio Chest PE W and/or Wo Contrast Result Date: 04/25/2024 CLINICAL DATA:  Pulmonary embolism (PE) suspected, high prob r/o PE Left leg pain, swelling and erythema. Shortness of breath. Recent back surgery. Acute left lower extremity DVT on Doppler ultrasound. EXAM: CT ANGIOGRAPHY CHEST WITH CONTRAST TECHNIQUE: Multidetector CT imaging of the chest was performed using the standard protocol during bolus administration of intravenous contrast. Multiplanar CT image reconstructions and MIPs were obtained to evaluate the vascular anatomy. RADIATION DOSE REDUCTION: This exam was performed according to the departmental dose-optimization program which includes automated exposure control, adjustment of the mA and/or kV according to patient size and/or use of iterative reconstruction technique. CONTRAST:  75mL OMNIPAQUE  IOHEXOL  350 MG/ML SOLN COMPARISON:  Lower extremity Doppler ultrasound same date. Chest radiographs 11/24/2022. Cardiac CT 03/01/2023. FINDINGS: Cardiovascular: The pulmonary arteries are well opacified with contrast to the level of the segmental branches. There is a large amount of acute pulmonary thromboembolic disease bilaterally. There is a small nonocclusive saddle embolus within the main pulmonary arteries. There are partially occlusive emboli within the lower lobes bilaterally. The heart size is normal, although there is relative dilatation of the right atrium relative  to the right ventricle (RV to LV ratio 1.31) suspicious for acute right heart strain. No significant pericardial fluid. No evidence of acute systemic arterial abnormality. Mediastinum/Nodes: There are no enlarged mediastinal, hilar or axillary lymph nodes. The thyroid gland, trachea and esophagus demonstrate no significant findings. Lungs/Pleura: No significant pleural effusion or pneumothorax. New subpleural opacity in the right lower lobe on image 61/6, suspicious  for pulmonary infarct. There is mildly increased atelectasis or scarring at the left lung base. Upper abdomen: Mild hepatic steatosis. No acute findings demonstrated within the visualized upper abdomen. Musculoskeletal/Chest wall: There is no chest wall mass or suspicious osseous finding. Multilevel spondylosis. Review of the MIP images confirms the above findings. IMPRESSION: 1. Large amount of acute pulmonary thromboembolic disease bilaterally with small nonocclusive saddle embolus in the main pulmonary arteries. 2. Evidence of acute right heart strain (RV/LV Ratio = 1.31) consistent with at least submassive (intermediate risk) PE. The presence of right heart strain has been associated with an increased risk of morbidity and mortality. Please refer to the Code PE Focused order set in EPIC. 3. New subpleural opacity in the right lower lobe suspicious for pulmonary infarct. 4. Mild hepatic steatosis. 5. Critical Value/emergent results were called by telephone at the time of interpretation on 04/25/2024 at 2:20 pm to provider KEVIN STEINL , who verbally acknowledged these results. Electronically Signed   By: Elsie Perone M.D.   On: 04/25/2024 14:21   US  Venous Img Lower  Left (DVT Study) Result Date: 04/25/2024 CLINICAL DATA:  Left lower extremity pain and swelling EXAM: LEFT LOWER EXTREMITY VENOUS DOPPLER ULTRASOUND TECHNIQUE: Gray-scale sonography with graded compression, as well as color Doppler and duplex ultrasound were performed to evaluate the lower extremity deep venous systems from the level of the common femoral vein and including the common femoral, femoral, profunda femoral, popliteal and calf veins including the posterior tibial, peroneal and gastrocnemius veins when visible. The superficial great saphenous vein was also interrogated. Spectral Doppler was utilized to evaluate flow at rest and with distal augmentation maneuvers in the common femoral, femoral and popliteal veins. COMPARISON:  None  Available. FINDINGS: Contralateral Common Femoral Vein: Respiratory phasicity is normal and symmetric with the symptomatic side. No evidence of thrombus. Normal compressibility. Common Femoral Vein: Noncompressible. No evidence of color flow on color Doppler imaging. The lumen is expanded and filled with low-level internal echoes. Findings are consistent with acute occlusive DVT. Saphenofemoral Junction: Acute occlusive DVT extends into the saphenofemoral junction. Profunda Femoral Vein: Acute occlusive DVT extends into the profunda femoral vein. Femoral Vein: Acute occlusive DVT extends throughout the femoral vein in the thigh and into the popliteal vein. Popliteal Vein: Acute occlusive DVT throughout the popliteal vein. Calf Veins: Acute occlusive DVT extends into 1 of the posterior tibial veins. The other posterior tibial vein in the peroneal veins remain patent. Superficial Great Saphenous Vein: No evidence of thrombus. Normal compressibility. Venous Reflux:  None. Other Findings:  None. IMPRESSION: Positive for extensive acute occlusive DVT throughout the left lower extremity from the common femoral vein into the calf veins. Electronically Signed   By: Wilkie Lent M.D.   On: 04/25/2024 12:20   DG Lumbar Spine 2-3 Views Result Date: 04/04/2024 CLINICAL DATA:  Surgical posterior fusion of lumbar spine. EXAM: LUMBAR SPINE - 2-3 VIEW; DG C-ARM 1-60 MIN-NO REPORT Radiation exposure index: 74.95 mGy. COMPARISON:  April 03, 2024. FINDINGS: Five intraoperative fluoroscopic images were obtained during interval surgical posterior fusion of L2-3 and L3-4 with intrapedicular screw placement.  IMPRESSION: Fluoroscopic guidance provided during surgical posterior fusion of L2-3 and L3-4. Electronically Signed   By: Lynwood Landy Raddle M.D.   On: 04/04/2024 16:33   DG C-Arm 1-60 Min-No Report Result Date: 04/04/2024 Fluoroscopy was utilized by the requesting physician.  No radiographic interpretation.   DG C-Arm 1-60  Min-No Report Result Date: 04/04/2024 Fluoroscopy was utilized by the requesting physician.  No radiographic interpretation.   DG C-Arm 1-60 Min-No Report Result Date: 04/04/2024 Fluoroscopy was utilized by the requesting physician.  No radiographic interpretation.   DG Lumbar Spine 2-3 Views Result Date: 04/03/2024 CLINICAL DATA:  461500 Elective surgery 461500. EXAM: LUMBAR SPINE - 2-3 VIEW COMPARISON:  None Available. FINDINGS: The provided image demonstrates lower lumbar spinal fusion hardware. There are transpedicular screws/rods and intervertebral disc spacers. Please refer to the separately issued operative report for further details. IMPRESSION: *Intraoperative fluoroscopy as above. Electronically Signed   By: Ree Molt M.D.   On: 04/03/2024 10:07   DG C-Arm 1-60 Min-No Report Result Date: 04/03/2024 Fluoroscopy was utilized by the requesting physician.  No radiographic interpretation.   DG C-Arm 1-60 Min-No Report Result Date: 04/03/2024 Fluoroscopy was utilized by the requesting physician.  No radiographic interpretation.    Microbiology: Results for orders placed or performed during the hospital encounter of 03/27/24  Surgical pcr screen     Status: None   Collection Time: 03/27/24 11:28 AM   Specimen: Nasal Mucosa; Nasal Swab  Result Value Ref Range Status   MRSA, PCR NEGATIVE NEGATIVE Final   Staphylococcus aureus NEGATIVE NEGATIVE Final    Comment: (NOTE) The Xpert SA Assay (FDA approved for NASAL specimens in patients 25 years of age and older), is one component of a comprehensive surveillance program. It is not intended to diagnose infection nor to guide or monitor treatment. Performed at [facility], [address] [place], [place] [address]     Labs: CBC: Recent Labs  Lab 04/25/24 1250 04/26/24 0439 04/27/24 0437  WBC 10.7* 10.9* 9.2  HGB 12.6 12.2 11.9*  HCT 39.7 38.7 36.7  MCV 97.8 97.7 98.1  PLT 244 230 232   Basic Metabolic Panel: Recent  Labs  Lab 04/25/24 1250 04/26/24 0439  NA 137 138  K 4.5 4.1  CL 101 102  CO2 25 22  GLUCOSE 119* 105*  BUN 28* 32*  CREATININE 0.93 0.97  CALCIUM  10.2 10.0  MG  --  2.2   Liver Function Tests: Recent Labs  Lab 04/25/24 1250  AST 21  ALT 18  ALKPHOS 89  BILITOT 0.6  PROT 7.9  ALBUMIN  3.2*   CBG: No results for input(s): GLUCAP in the last 168 hours.  Discharge time spent: greater than 30 minutes.  Signed: Eric Nunnery, MD [facility] 04/27/2024

## 2024-04-27 NOTE — Plan of Care (Signed)
  Problem: Health Behavior/Discharge Planning: Goal: Ability to manage health-related needs will improve Outcome: Progressing   Problem: Clinical Measurements: Goal: Ability to maintain clinical measurements within normal limits will improve Outcome: Progressing Goal: Will remain free from infection Outcome: Progressing Goal: Diagnostic test results will improve Outcome: Progressing   Problem: Elimination: Goal: Will not experience complications related to urinary retention Outcome: Progressing   Problem: Pain Managment: Goal: General experience of comfort will improve and/or be controlled Outcome: Completed/Met   Problem: Safety: Goal: Ability to remain free from injury will improve Outcome: Progressing

## 2024-04-27 NOTE — Discharge Instructions (Addendum)
 Information on my medicine - ELIQUIS (apixaban)  This medication education was reviewed with me or my healthcare representative as part of my discharge preparation.  The pharmacist that spoke with me during my hospital stay was:  Elspeth JAYSON Sour, [facility]  Why was Eliquis prescribed for you? Eliquis was prescribed to treat blood clots that may have been found in the veins of your legs (deep vein thrombosis) or in your lungs (pulmonary embolism) and to reduce the risk of them occurring again.  What do You need to know about Eliquis ? The starting dose is 10 mg (two 5 mg tablets) taken TWICE daily for the FIRST SEVEN (7) DAYS, then on 05/04/24 the dose is reduced to ONE 5 mg /tablet taken TWICE daily.  Eliquis may be taken with or without food.   Try to take the dose about the same time in the morning and in the evening. If you have difficulty swallowing the tablet whole please discuss with your pharmacist how to take the medication safely.  Take Eliquis exactly as prescribed and DO NOT stop taking Eliquis without talking to the doctor who prescribed the medication.  Stopping may increase your risk of developing a new blood clot.  Refill your prescription before you run out.  After discharge, you should have regular check-up appointments with your healthcare provider that is prescribing your Eliquis.    What do you do if you miss a dose? If a dose of ELIQUIS is not taken at the scheduled time, take it as soon as possible on the same day and twice-daily administration should be resumed. The dose should not be doubled to make up for a missed dose.  Important Safety Information A possible side effect of Eliquis is bleeding. You should call your healthcare provider right away if you experience any of the following: Bleeding from an injury or your nose that does not stop. Unusual colored urine (red or dark brown) or unusual colored stools (red or black). Unusual bruising for unknown reasons. A  serious fall or if you hit your head (even if there is no bleeding).  Some medicines may interact with Eliquis and might increase your risk of bleeding or clotting while on Eliquis. To help avoid this, consult your healthcare provider or pharmacist prior to using any new prescription or non-prescription medications, including herbals, vitamins, non-steroidal anti-inflammatory drugs (NSAIDs) and supplements.  This website has more information on Eliquis (apixaban): http://www.eliquis.com/eliquis/home

## 2024-05-01 LAB — FACTOR 5 LEIDEN

## 2024-05-02 LAB — DRVVT CONFIRM: dRVVT Confirm: 1.3 ratio — ABNORMAL HIGH (ref 0.8–1.2)

## 2024-05-02 LAB — PTT-LA INCUB MIX: PTT-LA Incub Mix: 34 s (ref 0.0–40.5)

## 2024-05-02 LAB — LUPUS ANTICOAGULANT PANEL
DRVVT: 51 s — ABNORMAL HIGH (ref 0.0–47.0)
PTT Lupus Anticoagulant: 44.7 s — ABNORMAL HIGH (ref 0.0–43.5)

## 2024-05-02 LAB — PTT-LA MIX: PTT-LA Mix: 40.3 s (ref 0.0–40.5)

## 2024-05-02 LAB — DRVVT MIX: dRVVT Mix: 42.4 s — ABNORMAL HIGH (ref 0.0–40.4)

## 2024-05-03 LAB — PROTHROMBIN GENE MUTATION

## 2024-05-10 ENCOUNTER — Encounter: Payer: Self-pay | Admitting: Internal Medicine

## 2024-05-10 ENCOUNTER — Ambulatory Visit: Attending: Internal Medicine | Admitting: Internal Medicine

## 2024-05-10 VITALS — BP 116/71 | HR 90 | Ht 65.0 in | Wt 183.0 lb

## 2024-05-10 DIAGNOSIS — I10 Essential (primary) hypertension: Secondary | ICD-10-CM

## 2024-05-10 DIAGNOSIS — I2699 Other pulmonary embolism without acute cor pulmonale: Secondary | ICD-10-CM

## 2024-05-10 DIAGNOSIS — Q2112 Patent foramen ovale: Secondary | ICD-10-CM

## 2024-05-10 NOTE — Patient Instructions (Signed)
 Medication Instructions:  The current medical regimen is effective;  continue present plan and medications.  *If you need a refill on your cardiac medications before your next appointment, please call your pharmacy*  Testing/Procedures: Your physician has requested that you have an echocardiogram in 3 months at [facility]. Echocardiography is a painless test that uses sound waves to create images of your heart. It provides your doctor with information about the size and shape of your heart and how well your heart's chambers and valves are working. This procedure takes approximately one hour. There are no restrictions for this procedure. Please do NOT wear cologne, perfume, aftershave, or lotions (deodorant is allowed). Please arrive 15 minutes prior to your appointment time.  Please note: We ask at that you not bring children with you during ultrasound (echo/ vascular) testing. Due to room size and safety concerns, children are not allowed in the ultrasound rooms during exams. Our front office staff cannot provide observation of children in our lobby area while testing is being conducted. An adult accompanying a patient to their appointment will only be allowed in the ultrasound room at the discretion of the ultrasound technician under special circumstances. We apologize for any inconvenience.   Follow-Up: At [facility], you and your health needs are our priority.  As part of our continuing mission to provide you with exceptional heart care, our providers are all part of one team.  This team includes your primary Cardiologist (physician) and Advanced Practice Providers or APPs (Physician Assistants and Nurse Practitioners) who all work together to provide you with the care you need, when you need it.  Your next appointment:   1 year(s)  Provider:   Orren Fabry, PA-C          We recommend signing up for the patient portal called MyChart.  Sign up information is provided on this  After Visit Summary.  MyChart is used to connect with patients for Virtual Visits (Telemedicine).  Patients are able to view lab/test results, encounter notes, upcoming appointments, etc.  Non-urgent messages can be sent to your provider as well.   To learn more about what you can do with MyChart, go to [email or web address].

## 2024-05-10 NOTE — Progress Notes (Signed)
 Cardiology Office Note:  .    Date:  05/10/2024  ID:  Carol Cox, DOB 1950-10-29, MRN [id number] PCP: Tanda Prentice DEL, MD  [facility] Providers Cardiologist:  Candyce Reek, MD     CC: Transition to new cardiologist  History of Present Illness: .    Carol Cox is a 73 y.o. female with mild nonobstructive coronary disease and recent pulmonary embolism who presents for cardiovascular evaluation.  She experiences significant fatigue and difficulty walking, which have worsened to the point of extreme exhaustion and limited mobility. She describes an episode of sudden shortness of breath while standing in her kitchen, necessitating rest for the remainder of the day. The following day, her left leg was swollen to twice its normal size, despite being on blood thinners. She manages the swelling by elevating her leg, which she feels is helping, albeit slowly.  She has a history of mild nonobstructive coronary disease and a strong family history of coronary disease. Her LDL levels are slightly above goal despite taking Zetia  and Repatha , as she has statin myopathy and cannot tolerate atorvastatin, rosuvastatin , or any previous statin trials. She is also on fish oil and takes less than 1000 mg of calcium  daily.  Her blood thinners are managed by her primary care team. She is not currently on lisinopril  and recalls being told in the hospital that she should not be on it. She recalls being informed in the hospital that her low blood pressure was due to the blood clot.  She frequently feels cold, which may be attributed to being on blood thinners. She is concerned about the duration of her recovery and the persistence of her symptoms, including the swelling in her leg and the impact on her daily activities.  Reports no palpitations.   Discussed the use of AI scribe software for clinical note transcription with the patient, who gave verbal consent to proceed.  Relevant histories: .   Social  - former [facility] patient ROS: As per HPI.   Studies Reviewed: .     Cardiac Studies & Procedures   ______________________________________________________________________________________________     ECHOCARDIOGRAM  ECHOCARDIOGRAM COMPLETE 04/26/2024  Narrative ECHOCARDIOGRAM REPORT    Patient Name:   Carol Cox Date of Exam: 04/26/2024 Medical Rec #:  [id number]       Height:       65.0 in Accession #:    [id number]      Weight:       181.9 lb Date of Birth:  [birth year]       BSA:          1.900 m Patient Age:    73 years        BP:           106/69 mmHg Patient Gender: F               HR:           80 bpm. Exam Location:  [facility]  Procedure: 2D Echo, Cardiac Doppler and Color Doppler (Both Spectral and Color Flow Doppler were utilized during procedure).  Indications:    Pulmonary Embolus I26.09  History:        Patient has prior history of Echocardiogram examinations, most recent 03/31/2023. CAD; Risk Factors:Family History of Coronary Artery Disease and Dyslipidemia.  Sonographer:    Aida Pizza RCS Referring Phys: [phone] DAVID TAT  IMPRESSIONS   1. Left ventricular ejection fraction, by estimation, is 60 to 65%. The left ventricle has  normal function. The left ventricle has no regional wall motion abnormalities. Left ventricular diastolic parameters are indeterminate. 2. Right ventricular systolic function is normal. The right ventricular size is normal. There is moderately elevated pulmonary artery systolic pressure. The estimated right ventricular systolic pressure is 46.3 mmHg. 3. The mitral valve is degenerative. Trivial mitral valve regurgitation. 4. Tricuspid valve regurgitation is moderate. 5. The aortic valve is tricuspid. Aortic valve regurgitation is not visualized. Aortic valve sclerosis is present, with no evidence of aortic valve stenosis. 6. The inferior vena cava is normal in size with greater than 50% respiratory variability, suggesting right  atrial pressure of 3 mmHg.  Comparison(s): Prior images reviewed side by side. LVEF 60-65% and RV contraction normal. Moderately elevated estimated RVSP.  FINDINGS Left Ventricle: Left ventricular ejection fraction, by estimation, is 60 to 65%. The left ventricle has normal function. The left ventricle has no regional wall motion abnormalities. The left ventricular internal cavity size was normal in size. There is no left ventricular hypertrophy. Left ventricular diastolic parameters are indeterminate.  Right Ventricle: The right ventricular size is normal. No increase in right ventricular wall thickness. Right ventricular systolic function is normal. There is moderately elevated pulmonary artery systolic pressure. The tricuspid regurgitant velocity is 3.29 m/s, and with an assumed right atrial pressure of 3 mmHg, the estimated right ventricular systolic pressure is 46.3 mmHg.  Left Atrium: Left atrial size was normal in size.  Right Atrium: Right atrial size was normal in size.  Pericardium: There is no evidence of pericardial effusion. Presence of epicardial fat layer.  Mitral Valve: The mitral valve is degenerative in appearance. Mild mitral annular calcification. Trivial mitral valve regurgitation.  Tricuspid Valve: The tricuspid valve is grossly normal. Tricuspid valve regurgitation is moderate.  Aortic Valve: The aortic valve is tricuspid. There is mild aortic valve annular calcification. Aortic valve regurgitation is not visualized. Aortic valve sclerosis is present, with no evidence of aortic valve stenosis.  Pulmonic Valve: The pulmonic valve was grossly normal. Pulmonic valve regurgitation is mild.  Aorta: The aortic root is normal in size and structure.  Venous: The inferior vena cava is normal in size with greater than 50% respiratory variability, suggesting right atrial pressure of 3 mmHg.  IAS/Shunts: No atrial level shunt detected by color flow Doppler.  Additional  Comments: 3D was performed not requiring image post processing on an independent workstation and was indeterminate.   LEFT VENTRICLE PLAX 2D LVIDd:         3.90 cm   Diastology LVIDs:         2.20 cm   LV e' medial:    9.57 cm/s LV PW:         0.70 cm   LV E/e' medial:  9.1 LV IVS:        0.80 cm   LV e' lateral:   11.50 cm/s LVOT diam:     1.90 cm   LV E/e' lateral: 7.6 LV SV:         66 LV SV Index:   35 LVOT Area:     2.84 cm   RIGHT VENTRICLE RV S prime:     15.00 cm/s TAPSE (M-mode): 2.0 cm  LEFT ATRIUM             Index        RIGHT ATRIUM           Index LA diam:        2.70 cm 1.42 cm/m   RA  Area:     12.70 cm LA Vol (A2C):   34.1 ml 17.95 ml/m  RA Volume:   25.70 ml  13.53 ml/m LA Vol (A4C):   25.5 ml 13.42 ml/m LA Biplane Vol: 29.6 ml 15.58 ml/m AORTIC VALVE LVOT Vmax:   131.00 cm/s LVOT Vmean:  90.900 cm/s LVOT VTI:    0.233 m  AORTA Ao Root diam: 3.30 cm  MITRAL VALVE                TRICUSPID VALVE MV Area (PHT): 2.73 cm     TR Peak grad:   43.3 mmHg MV Decel Time: 278 msec     TR Vmax:        329.00 cm/s MV E velocity: 87.50 cm/s MV A velocity: 126.00 cm/s  SHUNTS MV E/A ratio:  0.69         Systemic VTI:  0.23 m Systemic Diam: 1.90 cm  Jayson Sierras MD Electronically signed by Jayson Sierras MD Signature Date/Time: 04/26/2024/4:40:21 PM    Final      CT SCANS  CT CORONARY MORPH W/CTA COR W/SCORE 03/01/2023  Addendum 03/01/2023  1:32 PM ADDENDUM REPORT: 03/01/2023 13:30  EXAM: OVER-READ INTERPRETATION  CT CHEST  The following report is an over-read performed by radiologist Dr. Melinda Blietzof [facility] on 03/01/2023. This over-read does not include interpretation of cardiac or coronary anatomy or pathology. The interpretation by the cardiologist is attached.  COMPARISON:  None.  FINDINGS: Scout view is unremarkable.  No acute extracardiac findings.  IMPRESSION: No extracardiac findings requiring  follow-up.   Electronically Signed By: Newell Eke M.D. On: 03/01/2023 13:30  Narrative CLINICAL DATA:  Chest pain  EXAM: Cardiac CTA  MEDICATIONS: Sub lingual nitro. 4mg  and lopressor  100mg   TECHNIQUE: The patient was scanned on a Siemens Force 192 slice scanner. Gantry rotation speed was 250 msecs. Collimation was .6 mm. A 120 kV prospective scan was triggered in the ascending thoracic aorta at 140 HU's Full mA was used between 35% and 75% of the R-R interval. Average HR during the scan was 57 bpm. The 3D data set was interpreted on a dedicated work station using MPR, MIP and VRT modes. A total of 80cc of contrast was used.  FINDINGS: Non-cardiac: See separate report from [facility]. No significant findings on limited lung and soft tissue windows.  Calcium  Score: 3 vessel calcium  noted  LM 0  LAD 74  LCX 24.4  RCA 0.61  Total 99  Coronary Arteries: Right dominant with no anomalies  LM:  LAD: 1-24% mixed plaque proximally 1-24% calcified mid vessel stenosis  D1: Normal  D2: Normal  D3: Normal  Circumflex: 1-24% calcified ostial stenosis  OM1: Normal  OM2: Normal  RCA: 1-24% calcified plaque in mid/distal vessel  PDA: Normal  PLA: 1-24% calcified plaque  IMPRESSION: 1. Normal ascending thoracic aorta 3.1 cm  2.  PFO present  3.  CAD RADS 1 non obstructive CAD see description above  4.  Calcium  score 99 which is 72 nd percentile for age/sex  Maude Emmer  Electronically Signed: By: Maude Emmer M.D. On: 03/01/2023 13:16     ______________________________________________________________________________________________       Physical Exam:    VS:  BP 116/71 (BP Location: Left Arm, Patient Position: Sitting, Cuff Size: Normal)   Pulse 90   Ht 5' 5 (1.651 m)   Wt 183 lb (83 kg)   LMP  (LMP Carol)   SpO2 97%   BMI 30.45 kg/m  Wt Readings from Last 3 Encounters:  05/10/24 183 lb (83 kg)  04/25/24 181 lb  14.1 oz (82.5 kg)  04/03/24 190 lb (86.2 kg)    Gen: no distress   Neck: No JVD Cardiac: No Rubs or Gallops, holosystolic murmur, RRR +2 radial pulses Respiratory: Clear to auscultation bilaterally, normal effort, normal  respiratory rate GI: Soft, nontender, non-distended  MS: Left leg  edema;  moves all extremities Integument: Skin feels warm Neuro:  At time of evaluation, alert and oriented to person/place/time/situation  Psych: Normal affect, patient feels ok   ASSESSMENT AND PLAN: .    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 Echocardiogram shows right heart strain with enlargement of the right side of the heart compared to the left. A small pericardial effusion is noted, likely secondary to the pulmonary embolism, expected to resolve with treatment. - Continue anticoagulation therapy as per primary - Order echocardiogram in 3 months to reassess right heart function and clot resolution - Monitor blood pressure and consider reintroducing lisinopril  if blood pressure increases (normotensive currently)  Pericardial effusion Small pericardial effusion likely secondary to the pulmonary embolism. No immediate intervention required as it is expected to resolve with the treatment of the pulmonary embolism.  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 Currently managed with Zetia  and Repatha , although LDL levels remain slightly above goal.   Nonobstructive coronary artery disease Nonobstructive coronary artery disease with a strong family history of coronary disease. Current management includes Zetia  and Repatha  for lipid control. Not on statins due to intolerance.  One year unless cardiac issues  Stanly Leavens, MD FASE [facility] Cardiologist [facility]  [address] [employer], [address] [place], [place] [address] [phone]  3:40 PM

## 2024-05-12 ENCOUNTER — Other Ambulatory Visit: Payer: Self-pay | Admitting: Interventional Cardiology

## 2024-05-12 DIAGNOSIS — I2510 Atherosclerotic heart disease of native coronary artery without angina pectoris: Secondary | ICD-10-CM

## 2024-05-12 DIAGNOSIS — E785 Hyperlipidemia, unspecified: Secondary | ICD-10-CM

## 2024-06-16 ENCOUNTER — Other Ambulatory Visit: Payer: Self-pay

## 2024-06-16 DIAGNOSIS — I824Y2 Acute embolism and thrombosis of unspecified deep veins of left proximal lower extremity: Secondary | ICD-10-CM

## 2024-07-10 ENCOUNTER — Other Ambulatory Visit: Payer: Self-pay | Admitting: Internal Medicine

## 2024-07-11 MED ORDER — EZETIMIBE 10 MG PO TABS: 10.0000 mg | ORAL_TABLET | Freq: Every day | ORAL | 3 refills | Status: AC

## 2024-07-26 ENCOUNTER — Encounter: Payer: Self-pay | Admitting: Vascular Surgery

## 2024-07-26 ENCOUNTER — Ambulatory Visit (INDEPENDENT_AMBULATORY_CARE_PROVIDER_SITE_OTHER): Admitting: Vascular Surgery

## 2024-07-26 ENCOUNTER — Ambulatory Visit (HOSPITAL_COMMUNITY)
Admission: RE | Admit: 2024-07-26 | Discharge: 2024-07-26 | Disposition: A | Discharge status: Home / Self Care | Source: Ambulatory Visit | Attending: Vascular Surgery | Admitting: Vascular Surgery

## 2024-07-26 VITALS — BP 142/93 | HR 70 | Temp 97.9°F | Ht 65.0 in | Wt 185.0 lb

## 2024-07-26 DIAGNOSIS — I87002 Postthrombotic syndrome without complications of left lower extremity: Secondary | ICD-10-CM | POA: Diagnosis present

## 2024-07-26 DIAGNOSIS — I824Y2 Acute embolism and thrombosis of unspecified deep veins of left proximal lower extremity: Secondary | ICD-10-CM | POA: Insufficient documentation

## 2024-07-26 NOTE — Progress Notes (Signed)
 Patient ID: Carol Cox, female   DOB: 1951/08/14, 73 y.o.   MRN: [id number]  Reason for Consult: New Patient (Initial Visit)   Referred by Jesus Elberta Gainer, *  Subjective:     HPI:  Carol Cox is a 73 y.o. female with history of previous spine surgery most recently underwent additional L2-4 posterior spinal instrumentation in June.  She has subsequently diagnosed with extensive left lower extremity DVT on 1 July now maintained on Eliquis .  She denies any previous history of DVT and no family history of DVT.  She has persistent swelling in the left lower extremity with discoloration and pain of the medial ankle.  She has been minimally compliant with compression stockings due to the pain.  Plan is for Eliquis  for 6 months due to pulmonary embolus.  Past Medical History:  Diagnosis Date   Allergy    seasonal   Arthritis    knee, left hip, ankles    Borderline glaucoma    CAD (coronary artery disease)    Mild nonobstructive CAD by cath 02/06/13 (25% mid LAD), normal EF   Chronic low back pain    Colitis    Coronary arteriosclerosis    DDD (degenerative disc disease), lumbar    Degeneration of intervertebral disc of lumbar region    Family history of adverse reaction to anesthesia    mother has problems with nausea and vomiting    Glaucoma    History of colitis    02/ 2016  acute infectious colitis-- resolved   History of total knee arthroplasty    Hyperglycemia    Hyperlipidemia    Hypertension    Left ovarian cyst    Low back pain    with bilateral hip radiation    Lumbar radiculopathy    Lumbar spondylolysis    Obesity    Osteoporosis    ospenia of hips   Pain in joint of left shoulder    Pain of right hip joint    PONV (postoperative nausea and vomiting)    SEVERE   Family History  Problem Relation Age of Onset   CVA Mother    Sudden death Father 34       Presumed heart attack   Heart attack Father    Heart attack Sister 36   CAD Sister    CAD  Maternal Uncle 71   CAD Maternal Uncle 50   Heart attack Maternal Uncle 42   Heart attack Maternal Uncle 50   Colon cancer Neg Hx    Colon polyps Neg Hx    Esophageal cancer Neg Hx    Rectal cancer Neg Hx    Stomach cancer Neg Hx    Past Surgical History:  Procedure Laterality Date   ABDOMINAL EXPOSURE N/A 05/01/2020   Procedure: ABDOMINAL EXPOSURE;  Surgeon: Oris Krystal FALCON, MD;  Location: [facility] OR;  Service: Vascular;  Laterality: N/A;   ANTERIOR LAT LUMBAR FUSION N/A 05/01/2020   Procedure: ANTERIOR LATERAL LUMBAR FUSION L4-S1;  Surgeon: [name], MD;  Location: [facility] OR;  Service: Orthopedics;  Laterality: N/A;  4.5 hrs Dr. Oris to do approach tap block with exparel    ANTERIOR LAT LUMBAR FUSION N/A 04/03/2024   Procedure: EXTREME LATERAL INTERBODY FUSION LUMBAR TWO - THREE TO LUMBAR THREE - FOUR;  Surgeon: [name], MD;  Location: [facility] OR;  Service: Orthopedics;  Laterality: N/A;  XLIF L2-3, L3-4   CARPAL TUNNEL RELEASE Bilateral 1990's   COLONOSCOPY     KNEE ARTHROSCOPY  W/ MENISCECTOMY Left 07/2014   LEFT HEART CATHETERIZATION WITH CORONARY ANGIOGRAM N/A 02/06/2013   Procedure: LEFT HEART CATHETERIZATION WITH CORONARY ANGIOGRAM;  Surgeon: Deatrice DELENA Cage, MD;  Location: [facility] CATH LAB;  Service: Cardiovascular;  Laterality: N/A;  non-obstruction 25% mLAD,  normal LVF , ef 65-70%   NASAL SINUS SURGERY     OVARIAN CYST REMOVAL Left    ROTATOR CUFF REPAIR Right 10/2016   TOTAL KNEE ARTHROPLASTY Left 03/26/2015   Procedure: LEFT TOTAL KNEE ARTHROPLASTY;  Surgeon: Donnice Car, MD;  Location: [facility] ORS;  Service: Orthopedics;  Laterality: Left;    Short Social History:  Social History   Tobacco Use   Smoking status: Never   Smokeless tobacco: Never  Substance Use Topics   Alcohol use: No    Allergies  Allergen Reactions   Atorvastatin Other (See Comments)    Leg pain   Rosuvastatin  Other (See Comments)    Leg pain    Current Outpatient Medications  Medication Sig  Dispense Refill   acetaminophen  (TYLENOL ) 500 MG tablet Take 500 mg by mouth every 6 (six) hours as needed for mild pain (pain score 1-3).     apixaban  (ELIQUIS ) 5 MG TABS tablet Take 2 tablets (10 mg total) by mouth 2 (two) times daily for 7 days, THEN 1 tablet (5 mg total) 2 (two) times daily. 388 tablet 0   Calcium  200 MG TABS Take 200 mg by mouth daily.     Cholecalciferol (VITAMIN D3) 50 MCG (2000 UT) capsule Take 2,000 Units by mouth daily.     diazepam  (VALIUM ) 10 MG tablet Take 5 mg by mouth every 8 (eight) hours as needed for anxiety or sleep.     dorzolamide -timolol  (COSOPT ) 22.3-6.8 MG/ML ophthalmic solution Place 1 drop into both eyes in the morning and at bedtime.     Evolocumab  (REPATHA  SURECLICK) 140 MG/ML SOAJ INJECT 1 PEN INTO THE SKIN EVERY 14 (FOURTEEN) DAYS. 6 mL 1   ezetimibe  (ZETIA ) 10 MG tablet Take 1 tablet (10 mg total) by mouth daily. 90 tablet 3   methocarbamol  (ROBAXIN ) 500 MG tablet Take 500 mg by mouth every 8 (eight) hours as needed for muscle spasms. For 14 days.     Omega-3 Fatty Acids (FISH OIL) 1000 MG CAPS Take 1 capsule by mouth daily.     PERCOCET 10-325 MG tablet Take 1 tablet by mouth every 8 (eight) hours as needed for pain.     psyllium (METAMUCIL) 58.6 % powder Take 1 packet by mouth as needed (constipation).     Travoprost, BAK Free, (TRAVATAN) 0.004 % SOLN ophthalmic solution Place 1 drop into both eyes at bedtime.      No current facility-administered medications for this visit.    Review of Systems  Constitutional:  Constitutional negative. HENT: HENT negative.  Eyes: Eyes negative.  Respiratory: Positive for shortness of breath.  Cardiovascular: Positive for leg swelling.  GI: Gastrointestinal negative.  Musculoskeletal: Positive for leg pain.  Skin: Skin negative.  Neurological: Neurological negative. Hematologic: Hematologic/lymphatic negative.  Psychiatric: Psychiatric negative.        Objective:  Objective   Vitals:   07/26/24  1341  BP: (!) 142/93  Pulse: 70  Temp: 97.9 F (36.6 C)  SpO2: 96%  Weight: 185 lb (83.9 kg)  Height: 5' 5 (1.651 m)   Body mass index is 30.79 kg/m.  Physical Exam HENT:     Head: Normocephalic.     Nose: Nose normal.     Mouth/Throat:  Mouth: Mucous membranes are moist.  Cardiovascular:     Rate and Rhythm: Normal rate.     Pulses: Normal pulses.  Pulmonary:     Effort: Pulmonary effort is normal.  Abdominal:     General: Abdomen is flat.  Musculoskeletal:     Right lower leg: No edema.     Left lower leg: Edema present.     Comments: right leg measures 39 cm, Left leg measures 42 cm(measured 10 cm from tibial tuberosities)  Neurological:     Mental Status: She is alert.     Data: LEFT     CompressibilityPhasicitySpontaneityPropertiesThrombus  Aging  +---------+---------------+---------+-----------+----------+--------------+   CFV     Full           Yes      Yes                                   +---------+---------------+---------+-----------+----------+--------------+   SFJ     Full                                                          +---------+---------------+---------+-----------+----------+--------------+   FV Prox  Full                                                          +---------+---------------+---------+-----------+----------+--------------+   FV Mid   Full           Yes      Yes                                   +---------+---------------+---------+-----------+----------+--------------+   FV DistalFull                                                          +---------+---------------+---------+-----------+----------+--------------+   PFV     Full                                                          +---------+---------------+---------+-----------+----------+--------------+   POP     Full           Yes      Yes                                    +---------+---------------+---------+-----------+----------+--------------+   PTV     Full                                                          +---------+---------------+---------+-----------+----------+--------------+  PERO    Full                                                          +---------+---------------+---------+-----------+----------+--------------+     LEFT:  - There is no evidence of deep vein thrombosis in the lower extremity.    - No cystic structure found in the popliteal fossa.  - Complete resolution of previously documented DVT involving the left  lower extremity.      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  This is somewhat surprising given that chronic thrombosis would be expected.  She does have significant swelling between 5 and 10% with symptoms that correlate.  I recommended continued compression stockings and elevation when she is recumbent and exercise and water and she demonstrates good understanding of this.  She will continue Eliquis  out to 6 months and transition to aspirin  given pulmonary embolus and extensive DVT.  We discussed that the this is the left leg with swelling into the thigh and possibly has a May-Thurner configuration although did not evident on CT scan from 9 years ago she has had multiple spinal instrumentation since that time.  Will plan for CT venogram in 3 months prior to discontinuing Eliquis .  All questions were answered she demonstrates good understanding.     Penne Lonni Colorado MD [facility]

## 2024-08-09 ENCOUNTER — Encounter (HOSPITAL_BASED_OUTPATIENT_CLINIC_OR_DEPARTMENT_OTHER): Payer: Self-pay

## 2024-08-10 ENCOUNTER — Ambulatory Visit (HOSPITAL_BASED_OUTPATIENT_CLINIC_OR_DEPARTMENT_OTHER)

## 2024-08-10 ENCOUNTER — Ambulatory Visit: Payer: Self-pay | Admitting: Internal Medicine

## 2024-08-10 DIAGNOSIS — I2699 Other pulmonary embolism without acute cor pulmonale: Secondary | ICD-10-CM | POA: Diagnosis not present

## 2024-08-10 DIAGNOSIS — Q2112 Patent foramen ovale: Secondary | ICD-10-CM | POA: Diagnosis not present

## 2024-08-10 DIAGNOSIS — I10 Essential (primary) hypertension: Secondary | ICD-10-CM

## 2024-08-10 LAB — ECHOCARDIOGRAM COMPLETE
Area-P 1/2: 3.37 cm2
S' Lateral: 2.66 cm

## 2024-09-14 ENCOUNTER — Other Ambulatory Visit: Payer: Self-pay

## 2024-09-14 ENCOUNTER — Ambulatory Visit (HOSPITAL_BASED_OUTPATIENT_CLINIC_OR_DEPARTMENT_OTHER): Attending: Orthopedic Surgery | Admitting: Physical Therapy

## 2024-09-14 ENCOUNTER — Encounter (HOSPITAL_BASED_OUTPATIENT_CLINIC_OR_DEPARTMENT_OTHER): Payer: Self-pay | Admitting: Physical Therapy

## 2024-09-14 DIAGNOSIS — M6281 Muscle weakness (generalized): Secondary | ICD-10-CM | POA: Diagnosis present

## 2024-09-14 DIAGNOSIS — M5459 Other low back pain: Secondary | ICD-10-CM | POA: Diagnosis present

## 2024-09-14 DIAGNOSIS — M25572 Pain in left ankle and joints of left foot: Secondary | ICD-10-CM | POA: Diagnosis present

## 2024-09-14 DIAGNOSIS — M25571 Pain in right ankle and joints of right foot: Secondary | ICD-10-CM | POA: Diagnosis present

## 2024-09-14 NOTE — Therapy (Signed)
 OUTPATIENT PHYSICAL THERAPY THORACOLUMBAR EVALUATION   Patient Name: Carol Cox MRN: [id number] DOB:[birth year], 73 y.o., female Today's Date: 09/14/2024  END OF SESSION:  PT End of Session - 09/14/24 1442     Visit Number 1    Date for Recertification  11/10/24    Authorization Type [employer] m[employer]    PT Start Time 1016    PT Stop Time 1055    PT Time Calculation (min) 39 min    Activity Tolerance Patient tolerated treatment well    Behavior During Therapy WFL for tasks assessed/performed          Past Medical History:  Diagnosis Date   Allergy    seasonal   Arthritis    knee, left hip, ankles    Borderline glaucoma    CAD (coronary artery disease)    Mild nonobstructive CAD by cath 02/06/13 (25% mid LAD), normal EF   Chronic low back pain    Colitis    Coronary arteriosclerosis    DDD (degenerative disc disease), lumbar    Degeneration of intervertebral disc of lumbar region    Family history of adverse reaction to anesthesia    mother has problems with nausea and vomiting    Glaucoma    History of colitis    02/ 2016  acute infectious colitis-- resolved   History of total knee arthroplasty    Hyperglycemia    Hyperlipidemia    Hypertension    Left ovarian cyst    Low back pain    with bilateral hip radiation    Lumbar radiculopathy    Lumbar spondylolysis    Obesity    Osteoporosis    ospenia of hips   Pain in joint of left shoulder    Pain of right hip joint    PONV (postoperative nausea and vomiting)    SEVERE   Past Surgical History:  Procedure Laterality Date   ABDOMINAL EXPOSURE N/A 05/01/2020   Procedure: ABDOMINAL EXPOSURE;  Surgeon: Oris Krystal FALCON, MD;  Location: [facility] OR;  Service: Vascular;  Laterality: N/A;   ANTERIOR LAT LUMBAR FUSION N/A 05/01/2020   Procedure: ANTERIOR LATERAL LUMBAR FUSION L4-S1;  Surgeon: [name], MD;  Location: [facility] OR;  Service: Orthopedics;  Laterality: N/A;  4.5 hrs Dr. Oris to do approach tap block with  exparel    ANTERIOR LAT LUMBAR FUSION N/A 04/03/2024   Procedure: EXTREME LATERAL INTERBODY FUSION LUMBAR TWO - THREE TO LUMBAR THREE - FOUR;  Surgeon: [name], MD;  Location: [facility] OR;  Service: Orthopedics;  Laterality: N/A;  XLIF L2-3, L3-4   CARPAL TUNNEL RELEASE Bilateral 1990's   COLONOSCOPY     KNEE ARTHROSCOPY W/ MENISCECTOMY Left 07/2014   LEFT HEART CATHETERIZATION WITH CORONARY ANGIOGRAM N/A 02/06/2013   Procedure: LEFT HEART CATHETERIZATION WITH CORONARY ANGIOGRAM;  Surgeon: Deatrice DELENA Cage, MD;  Location: [facility] CATH LAB;  Service: Cardiovascular;  Laterality: N/A;  non-obstruction 25% mLAD,  normal LVF , ef 65-70%   NASAL SINUS SURGERY     OVARIAN CYST REMOVAL Left    ROTATOR CUFF REPAIR Right 10/2016   TOTAL KNEE ARTHROPLASTY Left 03/26/2015   Procedure: LEFT TOTAL KNEE ARTHROPLASTY;  Surgeon: Donnice Car, MD;  Location: [facility] ORS;  Service: Orthopedics;  Laterality: Left;   Patient Active Problem List   Diagnosis Date Noted   Pulmonary embolism (HCC) 04/27/2024   Acute pulmonary embolism (HCC) 04/25/2024   Acute deep vein thrombosis (DVT) of left lower extremity (HCC) 04/25/2024   S/P lumbar fusion 05/01/2020  Hypertension 11/20/2019   Family history of early CAD 07/15/2017   Elevated blood pressure reading in office without diagnosis of hypertension 07/15/2017   Obese 03/27/2015   S/P left TKA 03/26/2015   S/P knee replacement 03/26/2015   Colitis, acute 12/01/2014   Weight loss, unintentional 12/01/2014   Adnexal cyst 12/01/2014   Elevated fasting glucose 12/01/2014   CAD (coronary artery disease) 12/01/2014   Osteoarthritis 12/01/2014   Vasovagal attack 02/28/2013   Hyperlipidemia 02/28/2013    PCP: Prentice Blush MD  REFERRING PROVIDER: Donaciano Sprang MD  REFERRING DIAG: M54.50 (ICD-10-CM) - Low back pain, unspecified   Rationale for Evaluation and Treatment: Rehabilitation  THERAPY DIAG:  Other low back pain  Muscle weakness (generalized)  Bilateral  ankle pain, unspecified chronicity  ONSET DATE: June 2025  SUBJECTIVE:                                                                                                                                                                                           SUBJECTIVE STATEMENT: 3 weeks post surgery bl clots lung and lle.  On bl thinner better. Trying to ride elliptical up towards 40 mins a day at home in 2 sessions.  I do have some residual circulation problems through LLE primarily ankle and anterior calf area.  Prior to surgery I had right sided nerve pain into right knee and some calf now is is better. Only have pain there now after lying down at night after about 10 minutes.  I get up more than 5-6 times nightly due to pain.  No pain during day when standing. I have tendonitis both ankle chronic, limits some of my walking more than my back. Not as steady  PERTINENT HISTORY:  Surgery 04/03/24: 09/05/24 Dr sprang Pt is now 5 months out from a XLIF L2-4 with supplemental pedicle screw fixation L2-S1. Patient has had a prior L4-S1 instrumented fusion and we extended this to L2 .  04/25/2024 she was admitted to the hospital and diagnosed with a PE and lower extremity DVT   L TKR R Knee OA   PAIN:  Are you having pain? Yes: NPRS scale: current 1/10; 6/10 at night Pain location:  lower back with radiation into rle (in supine) Pain description: ache  Aggravating factors: supine/lying down Relieving factors: lying down short while  Tendonitis/oa in ankles  pain 0/10; worst: 6/10 Walking/standing > 30 minutes Ice or pain cream   PRECAUTIONS: Fall   RED FLAGS: None      WEIGHT BEARING RESTRICTIONS: No   FALLS:  Has patient fallen in last 6 months? No   LIVING ENVIRONMENT: Lives with:  lives with their spouse Lives in: House/apartment Stairs: 3-4 step going into home no handrail Has following equipment at home: None   OCCUPATION: retired.   PLOF: Independent  PATIENT GOALS:  sleeping better, energy, walking further, improve balance  NEXT MD VISIT: 6 months  OBJECTIVE:  Note: Objective measures were completed at Evaluation unless otherwise noted.  DIAGNOSTIC FINDINGS:  NA  PATIENT SURVEYS:  ODI  COGNITION: Overall cognitive status: Within functional limits for tasks assessed     SENSATION: WFL  MUSCLE LENGTH: Hamstrings: wfl   POSTURE: rounded shoulders, forward head, and decreased lumbar lordosis  PALPATION: No TTP  LUMBAR ROM:   AROM eval  Flexion NT  Extension wfl  Right lateral flexion wfl  Left lateral flexion Wfl Mild discomfort  Right rotation   Left rotation    (Blank rows = not tested)  LOWER EXTREMITY ROM:     wfl   LOWER EXTREMITY MMT:    MMT Right eval Left eval  Hip flexion 24.0 26.6  Hip extension    Hip abduction 23.7 25.6  Hip adduction    Hip internal rotation    Hip external rotation    Knee flexion 4 4  Knee extension    Ankle dorsiflexion 4 4  Ankle plantarflexion    Ankle inversion    Ankle eversion     (Blank rows = not tested)  LUMBAR SPECIAL TESTS:  Straight leg raise test: Negative  FUNCTIONAL TESTS:  5 times sit to stand: 17.54 Timed up and go (TUG): 13.57. Pt needs extra time to rise then lower from chair 4 stage balance: passed 1&2.  Tandem unsteady but able to hold 15s.  SLS x 3 s   GAIT: Distance walked: 500 ft Assistive device utilized: None Level of assistance: Complete Independence Comments: wfl  TREATMENT  Eval Self care:Posture and body mechanic instruction; exercise frequency/duration at home; resting; listening to body/ mindfulness                                                                                                                               PATIENT EDUCATION:  Education details: Discussed eval findings, rehab rationale, aquatic program progression/POC and pools in area. Patient is in agreement  Person educated: Patient Education method:  Explanation Education comprehension: verbalized understanding  HOME EXERCISE PROGRAM: Issued last episode: AQUATIC Access Code: [email or web address] URL: [email or web address] Date: 02/14/2024  ASSESSMENT:  CLINICAL IMPRESSION: Patient is a 73 y.o. f who was seen today for physical therapy evaluation and treatment for LBP.  She is well know to this clinic as she had an episode of aquatic intervention prior to most recent LB surgery in early June.  She presents today with improved pain sensitivity  in LB and rle since surgical procedure (XLIF L2-4). Pain is reported at night with supine or side lying and completely resolved with standing/moving. She is limited in forward lumbar flex and has some strength deficits in LE.  He balance  is lacking some but has had no falls. She feels her greatest limitation is her bilat ankle tendonitis and OA (as per pt) causing pain with amb and limiting her. She will benefit from skilled aquatic PT intervention to improve lumbar mobility, balance and reduce residual pain with intention to improve ability to rest at night and return to desired level of function. She reports an active lifestyle with her grandchildren and walking/shopping.  OBJECTIVE IMPAIRMENTS: decreased activity tolerance, decreased balance, decreased endurance, decreased mobility, difficulty walking, decreased ROM, decreased strength, impaired flexibility, postural dysfunction, and pain.   ACTIVITY LIMITATIONS: carrying, lifting, bending, squatting, sleeping, stairs, transfers, and locomotion level  PARTICIPATION LIMITATIONS: shopping, community activity, and yard work  [facility] POTENTIAL: Good  CLINICAL DECISION MAKING: Evolving/moderate complexity  EVALUATION COMPLEXITY: Moderate   GOALS: Goals reviewed with patient? Yes  SHORT TERM GOALS: Target date: 10/04/24  Pt will tolerate full aquatic sessions consistently without increase in pain and with improving function to demonstrate good  toleration and effectiveness of intervention.  Baseline: Goal status: INITIAL  2.  Pt will tolerate stair climbing using alternating pattern ascending and descending 6 steps without use of handrail Baseline:  Goal status: INITIAL  3.  Pt will report improved toleration to resting/sleeping in recliner vs bed for improved resting as nerves heal Baseline:  Goal status: INITIAL   LONG TERM GOALS: Target date: 11/07/24  Pt to improve on ODI by  13 % to demonstrate statistically significant Improvement in function. (MCID 13-15%) Baseline: 19/50=38% Goal status: INITIAL  2.  Pt will improve amb toleration to > 1 hour without limitation of ankle pain by 50% Baseline:  Goal status: INITIAL  3. Pt will improve strength in all areas listed by  5  to demonstrate improved overall physical function Baseline: see chart Goal status: INITIAL  4.  Pt will report reduced frequency of waking at night to <3x Baseline: >6 Goal status: INITIAL  5.  Pt will improve on 5 X STS test to <or= 12s to demonstrate improving functional lower extremity strength, transitional movements, and balance. (MDC = 4.2sec)  Baseline: 17.54 Goal status: INITIAL  PLAN:  PT FREQUENCY: 1-2x/week  PT DURATION: 8 weeks  PLANNED INTERVENTIONS: [id number]- PT Re-evaluation, 97750- Physical Performance Testing, 97110-Therapeutic exercises, 97530- Therapeutic activity, [id number]- Neuromuscular re-education, 97535- Self Care, [id number]- Manual therapy, [id number]- Gait training, [phone]- Aquatic Therapy, [phone] (1-2 muscles), [id number] (3+ muscles)- Dry Needling, Patient/Family education, Balance training, Stair training, Taping, Joint mobilization, DME instructions, Cryotherapy, and Moist heat.  PLAN FOR NEXT SESSION: aquatics for lumbosacral strengthening,, pain management, stretching,    Ronal Foots) Melonee Gerstel MPT 09/14/24 3:15 PM [facility] [address] [place], [place], [address] Phone:  [phone]   Fax:  [phone]   Referring diagnosis? Low back pain Treatment diagnosis? (if different than referring diagnosis) added ankle pain What was this (referring dx) caused by? [x]  Surgery []  Fall [x]  Ongoing issue []  Arthritis []  Other: ____________  Laterality: []  Rt []  Lt [x]  Both  Check all possible CPT codes:  *CHOOSE 10 OR LESS*    See Planned Interventions listed in the Plan section of the Evaluation.

## 2024-09-20 ENCOUNTER — Encounter (HOSPITAL_BASED_OUTPATIENT_CLINIC_OR_DEPARTMENT_OTHER): Payer: Self-pay | Admitting: Physical Therapy

## 2024-09-20 ENCOUNTER — Ambulatory Visit (HOSPITAL_BASED_OUTPATIENT_CLINIC_OR_DEPARTMENT_OTHER): Admitting: Physical Therapy

## 2024-09-20 DIAGNOSIS — M6281 Muscle weakness (generalized): Secondary | ICD-10-CM

## 2024-09-20 DIAGNOSIS — M25572 Pain in left ankle and joints of left foot: Secondary | ICD-10-CM

## 2024-09-20 DIAGNOSIS — M5459 Other low back pain: Secondary | ICD-10-CM | POA: Diagnosis not present

## 2024-09-20 NOTE — Therapy (Signed)
 OUTPATIENT PHYSICAL THERAPY THORACOLUMBAR TREATMENT   Patient Name: Carol Cox MRN: [id number] DOB:[birth year], 73 y.o., female Today's Date: 09/20/2024  END OF SESSION:  PT End of Session - 09/20/24 1228     Visit Number 2    Date for Recertification  11/10/24    Authorization Type [employer] mcr    Authorization Time Period 18 visits approved  From 11.20.2025 - 01.16.2026  Authorization #[id number]   [employer] ([employer])    Authorization - Visit Number 2    Authorization - Number of Visits 18    Progress Note Due on Visit 10    PT Start Time 1146    PT Stop Time 1225    PT Time Calculation (min) 39 min    Activity Tolerance Patient tolerated treatment well    Behavior During Therapy WFL for tasks assessed/performed           Past Medical History:  Diagnosis Date   Allergy    seasonal   Arthritis    knee, left hip, ankles    Borderline glaucoma    CAD (coronary artery disease)    Mild nonobstructive CAD by cath 02/06/13 (25% mid LAD), normal EF   Chronic low back pain    Colitis    Coronary arteriosclerosis    DDD (degenerative disc disease), lumbar    Degeneration of intervertebral disc of lumbar region    Family history of adverse reaction to anesthesia    mother has problems with nausea and vomiting    Glaucoma    History of colitis    02/ 2016  acute infectious colitis-- resolved   History of total knee arthroplasty    Hyperglycemia    Hyperlipidemia    Hypertension    Left ovarian cyst    Low back pain    with bilateral hip radiation    Lumbar radiculopathy    Lumbar spondylolysis    Obesity    Osteoporosis    ospenia of hips   Pain in joint of left shoulder    Pain of right hip joint    PONV (postoperative nausea and vomiting)    SEVERE   Past Surgical History:  Procedure Laterality Date   ABDOMINAL EXPOSURE N/A 05/01/2020   Procedure: ABDOMINAL EXPOSURE;  Surgeon: Oris Krystal FALCON, MD;  Location: [facility] OR;  Service: Vascular;  Laterality: N/A;   ANTERIOR  LAT LUMBAR FUSION N/A 05/01/2020   Procedure: ANTERIOR LATERAL LUMBAR FUSION L4-S1;  Surgeon: [name], MD;  Location: [facility];  Service: Orthopedics;  Laterality: N/A;  4.5 hrs Dr. Oris to do approach tap block with exparel    ANTERIOR LAT LUMBAR FUSION N/A 04/03/2024   Procedure: EXTREME LATERAL INTERBODY FUSION LUMBAR TWO - THREE TO LUMBAR THREE - FOUR;  Surgeon: [name], MD;  Location: [facility] OR;  Service: Orthopedics;  Laterality: N/A;  XLIF L2-3, L3-4   CARPAL TUNNEL RELEASE Bilateral 1990's   COLONOSCOPY     KNEE ARTHROSCOPY W/ MENISCECTOMY Left 07/2014   LEFT HEART CATHETERIZATION WITH CORONARY ANGIOGRAM N/A 02/06/2013   Procedure: LEFT HEART CATHETERIZATION WITH CORONARY ANGIOGRAM;  Surgeon: Deatrice DELENA Cage, MD;  Location: [facility] CATH LAB;  Service: Cardiovascular;  Laterality: N/A;  non-obstruction 25% mLAD,  normal LVF , ef 65-70%   NASAL SINUS SURGERY     OVARIAN CYST REMOVAL Left    ROTATOR CUFF REPAIR Right 10/2016   TOTAL KNEE ARTHROPLASTY Left 03/26/2015   Procedure: LEFT TOTAL KNEE ARTHROPLASTY;  Surgeon: Donnice Car, MD;  Location: [facility] ORS;  Service: Orthopedics;  Laterality: Left;   Patient Active Problem List   Diagnosis Date Noted   Pulmonary embolism (HCC) 04/27/2024   Acute pulmonary embolism (HCC) 04/25/2024   Acute deep vein thrombosis (DVT) of left lower extremity (HCC) 04/25/2024   S/P lumbar fusion 05/01/2020   Hypertension 11/20/2019   Family history of early CAD 07/15/2017   Elevated blood pressure reading in office without diagnosis of hypertension 07/15/2017   Obese 03/27/2015   S/P left TKA 03/26/2015   S/P knee replacement 03/26/2015   Colitis, acute 12/01/2014   Weight loss, unintentional 12/01/2014   Adnexal cyst 12/01/2014   Elevated fasting glucose 12/01/2014   CAD (coronary artery disease) 12/01/2014   Osteoarthritis 12/01/2014   Vasovagal attack 02/28/2013   Hyperlipidemia 02/28/2013    PCP: Prentice Blush MD  REFERRING PROVIDER: Donaciano Sprang MD  REFERRING DIAG: M54.50 (ICD-10-CM) - Low back pain, unspecified   Rationale for Evaluation and Treatment: Rehabilitation  THERAPY DIAG:  Other low back pain  Muscle weakness (generalized)  Bilateral ankle pain, unspecified chronicity  ONSET DATE: June 2025  SUBJECTIVE:                                                                                                                                                                                           SUBJECTIVE STATEMENT: No changes . Haven't had much back pain for the last 3 days or so 0/10  Initial Subjective 3 weeks post surgery bl clots lung and lle.  On bl thinner better. Trying to ride elliptical up towards 40 mins a day at home in 2 sessions.  I do have some residual circulation problems through LLE primarily ankle and anterior calf area.  Prior to surgery I had right sided nerve pain into right knee and some calf now is is better. Only have pain there now after lying down at night after about 10 minutes.  I get up more than 5-6 times nightly due to pain.  No pain during day when standing. I have tendonitis both ankle chronic, limits some of my walking more than my back. Not as steady  PERTINENT HISTORY:  Surgery 04/03/24: 09/05/24 Dr sprang Pt is now 5 months out from a XLIF L2-4 with supplemental pedicle screw fixation L2-S1. Patient has had a prior L4-S1 instrumented fusion and we extended this to L2 .  04/25/2024 she was admitted to the hospital and diagnosed with a PE and lower extremity DVT   L TKR R Knee OA   PAIN:  Are you having pain? Yes: NPRS scale: current 1/10; 6/10 at night Pain location:  lower back with radiation into rle (  in supine) Pain description: ache  Aggravating factors: supine/lying down Relieving factors: lying down short while  Tendonitis/oa in ankles  pain 0/10; worst: 6/10 Walking/standing > 30 minutes Ice or pain cream   PRECAUTIONS: Fall   RED FLAGS: None      WEIGHT  BEARING RESTRICTIONS: No   FALLS:  Has patient fallen in last 6 months? No   LIVING ENVIRONMENT: Lives with: lives with their spouse Lives in: House/apartment Stairs: 3-4 step going into home no handrail Has following equipment at home: None   OCCUPATION: retired.   PLOF: Independent  PATIENT GOALS: sleeping better, energy, walking further, improve balance  NEXT MD VISIT: 6 months  OBJECTIVE:  Note: Objective measures were completed at Evaluation unless otherwise noted.  DIAGNOSTIC FINDINGS:  NA  PATIENT SURVEYS:  ODI  COGNITION: Overall cognitive status: Within functional limits for tasks assessed     SENSATION: WFL  MUSCLE LENGTH: Hamstrings: wfl   POSTURE: rounded shoulders, forward head, and decreased lumbar lordosis  PALPATION: No TTP  LUMBAR ROM:   AROM eval  Flexion NT  Extension wfl  Right lateral flexion wfl  Left lateral flexion Wfl Mild discomfort  Right rotation   Left rotation    (Blank rows = not tested)  LOWER EXTREMITY ROM:     wfl   LOWER EXTREMITY MMT:    MMT Right eval Left eval  Hip flexion 24.0 26.6  Hip extension    Hip abduction 23.7 25.6  Hip adduction    Hip internal rotation    Hip external rotation    Knee flexion 4 4  Knee extension    Ankle dorsiflexion 4 4  Ankle plantarflexion    Ankle inversion    Ankle eversion     (Blank rows = not tested)  LUMBAR SPECIAL TESTS:  Straight leg raise test: Negative  FUNCTIONAL TESTS:  5 times sit to stand: 17.54 Timed up and go (TUG): 13.57. Pt needs extra time to rise then lower from chair 4 stage balance: passed 1&2.  Tandem unsteady but able to hold 15s.  SLS x 3 s   GAIT: Distance walked: 500 ft Assistive device utilized: None Level of assistance: Complete Independence Comments: wfl  TREATMENT  [facility] Adult PT Treatment:                                                DATE: 09/20/24 Pt seen for aquatic therapy today.  Treatment took place in water 3.5-4.75  ft in depth at the [facility] pool. Temp of water was 91.  Pt entered/exited the pool via stairs using alternating pattern with hand rail.  *walking forward, back and side stepping in 3.6-4.2 unsupported  - squatted rest period *L stretch *Ue support on yellow HB 4.0: toe raises; heel raises; hip add/abd; hip extension; relaxed squats x10 -squatted rest period *HS, quad and gastroc stretch at steps. *straddling noodle: cycling; hip add/abd then flex/ext.  Pt requires the buoyancy and hydrostatic pressure of water for support, and to offload joints by unweighting joint load by at least 50 % in navel deep water and by at least 75-80% in chest to neck deep water.  Viscosity of the water is needed for resistance of strengthening. Water current perturbations provides challenge to standing balance requiring increased core activation.  PATIENT EDUCATION:  Education details: Discussed eval findings, rehab rationale, aquatic program progression/POC and pools in area. Patient is in agreement  Person educated: Patient Education method: Explanation Education comprehension: verbalized understanding  HOME EXERCISE PROGRAM: Issued last episode: AQUATIC Access Code: [email or web address] URL: [email or web address] Date: 02/14/2024  ASSESSMENT:  CLINICAL IMPRESSION: Pt demonstrates safety and independence in aquatic setting with therapist instructing from deck. She is well known to this clinic from prior episode demonstrating confidence moving throughout all depths easily.  Pt is directed through various movement patterns and trials in both sitting and standing positions.   She is provided VC and demonstration throughout session for execution of exercises while monitoring toleration. She does not report any pain or discomfort throughout session.  Of note: left  quad tight vs right evident and reported with stretching. Pt edu on hydrostatic pressure and effect on venous blood return through le's.  Very good toleration today.  Will assess toleration post session next visit.  Goals are ongoing.    Initial Impression Patient is a 73 y.o. f who was seen today for physical therapy evaluation and treatment for LBP.  She is well know to this clinic as she had an episode of aquatic intervention prior to most recent LB surgery in early June.  She presents today with improved pain sensitivity  in LB and rle since surgical procedure (XLIF L2-4). Pain is reported at night with supine or side lying and completely resolved with standing/moving. She is limited in forward lumbar flex and has some strength deficits in LE.  He balance is lacking some but has had no falls. She feels her greatest limitation is her bilat ankle tendonitis and OA (as per pt) causing pain with amb and limiting her. She will benefit from skilled aquatic PT intervention to improve lumbar mobility, balance and reduce residual pain with intention to improve ability to rest at night and return to desired level of function. She reports an active lifestyle with her grandchildren and walking/shopping.  OBJECTIVE IMPAIRMENTS: decreased activity tolerance, decreased balance, decreased endurance, decreased mobility, difficulty walking, decreased ROM, decreased strength, impaired flexibility, postural dysfunction, and pain.   ACTIVITY LIMITATIONS: carrying, lifting, bending, squatting, sleeping, stairs, transfers, and locomotion level  PARTICIPATION LIMITATIONS: shopping, community activity, and yard work  [facility] POTENTIAL: Good  CLINICAL DECISION MAKING: Evolving/moderate complexity  EVALUATION COMPLEXITY: Moderate   GOALS: Goals reviewed with patient? Yes  SHORT TERM GOALS: Target date: 10/04/24  Pt will tolerate full aquatic sessions consistently without increase in pain and with improving function  to demonstrate good toleration and effectiveness of intervention.  Baseline: Goal status: INITIAL  2.  Pt will tolerate stair climbing using alternating pattern ascending and descending 6 steps without use of handrail Baseline:  Goal status: INITIAL  3.  Pt will report improved toleration to resting/sleeping in recliner vs bed for improved resting as nerves heal Baseline:  Goal status: INITIAL   LONG TERM GOALS: Target date: 11/07/24  Pt to improve on ODI by  13 % to demonstrate statistically significant Improvement in function. (MCID 13-15%) Baseline: 19/50=38% Goal status: INITIAL  2.  Pt will improve amb toleration to > 1 hour without limitation of ankle pain by 50% Baseline:  Goal status: INITIAL  3. Pt will improve strength in all areas listed by  5  to demonstrate improved overall physical function Baseline: see chart Goal status: INITIAL  4.  Pt will report reduced frequency of waking at night to <3x Baseline: >6 Goal status: INITIAL  5.  Pt will improve on 5 X STS test to <or= 12s to demonstrate improving functional lower extremity strength, transitional movements, and balance. (MDC = 4.2sec)  Baseline: 17.54 Goal status: INITIAL  PLAN:  PT FREQUENCY: 1-2x/week  PT DURATION: 8 weeks  PLANNED INTERVENTIONS: [id number]- PT Re-evaluation, 97750- Physical Performance Testing, 97110-Therapeutic exercises, 97530- Therapeutic activity, [id number]- Neuromuscular re-education, 97535- Self Care, [id number]- Manual therapy, [id number]- Gait training, [phone]- Aquatic Therapy, [phone] (1-2 muscles), [id number] (3+ muscles)- Dry Needling, Patient/Family education, Balance training, Stair training, Taping, Joint mobilization, DME instructions, Cryotherapy, and Moist heat.  PLAN FOR NEXT SESSION: aquatics for lumbosacral strengthening,, pain management, stretching,    Ronal Foots) Corban Kistler MPT 09/20/24 12:29 PM [facility] [address] [place], [place],  [address] Phone: [phone]   Fax:  [phone]   Referring diagnosis? Low back pain Treatment diagnosis? (if different than referring diagnosis) added ankle pain What was this (referring dx) caused by? [x]  Surgery []  Fall [x]  Ongoing issue []  Arthritis []  Other: ____________  Laterality: []  Rt []  Lt [x]  Both  Check all possible CPT codes:  *CHOOSE 10 OR LESS*    See Planned Interventions listed in the Plan section of the Evaluation.

## 2024-09-25 ENCOUNTER — Other Ambulatory Visit: Payer: Self-pay

## 2024-09-25 DIAGNOSIS — I87002 Postthrombotic syndrome without complications of left lower extremity: Secondary | ICD-10-CM

## 2024-09-27 ENCOUNTER — Ambulatory Visit (HOSPITAL_BASED_OUTPATIENT_CLINIC_OR_DEPARTMENT_OTHER): Admitting: Physical Therapy

## 2024-09-27 ENCOUNTER — Encounter (HOSPITAL_BASED_OUTPATIENT_CLINIC_OR_DEPARTMENT_OTHER): Payer: Self-pay | Admitting: Physical Therapy

## 2024-09-27 DIAGNOSIS — M6281 Muscle weakness (generalized): Secondary | ICD-10-CM | POA: Diagnosis present

## 2024-09-27 DIAGNOSIS — M5459 Other low back pain: Secondary | ICD-10-CM | POA: Diagnosis present

## 2024-09-27 NOTE — Therapy (Signed)
 OUTPATIENT PHYSICAL THERAPY THORACOLUMBAR TREATMENT   Patient Name: Carol Cox MRN: [id number] DOB:07-23-1951, 73 y.o., female Today's Date: 09/27/2024  END OF SESSION:  PT End of Session - 09/27/24 1149     Visit Number 3    Date for Recertification  11/10/24    Authorization Type [employer] m[employer]    Authorization Time Period 18 visits approved  From 11.20.2025 - 01.16.2026  Authorization #[id number]   [employer] ([employer])    Authorization - Visit Number 3    Authorization - Number of Visits 18    Progress Note Due on Visit 10    PT Start Time 1020    PT Stop Time 1100    PT Time Calculation (min) 40 min    Activity Tolerance Patient tolerated treatment well    Behavior During Therapy WFL for tasks assessed/performed            Past Medical History:  Diagnosis Date   Allergy    seasonal   Arthritis    knee, left hip, ankles    Borderline glaucoma    CAD (coronary artery disease)    Mild nonobstructive CAD by cath 02/06/13 (25% mid LAD), normal EF   Chronic low back pain    Colitis    Coronary arteriosclerosis    DDD (degenerative disc disease), lumbar    Degeneration of intervertebral disc of lumbar region    Family history of adverse reaction to anesthesia    mother has problems with nausea and vomiting    Glaucoma    History of colitis    02/ 2016  acute infectious colitis-- resolved   History of total knee arthroplasty    Hyperglycemia    Hyperlipidemia    Hypertension    Left ovarian cyst    Low back pain    with bilateral hip radiation    Lumbar radiculopathy    Lumbar spondylolysis    Obesity    Osteoporosis    ospenia of hips   Pain in joint of left shoulder    Pain of right hip joint    PONV (postoperative nausea and vomiting)    SEVERE   Past Surgical History:  Procedure Laterality Date   ABDOMINAL EXPOSURE N/A 05/01/2020   Procedure: ABDOMINAL EXPOSURE;  Surgeon: Oris Krystal FALCON, MD;  Location: [facility] OR;  Service: Vascular;  Laterality: N/A;   ANTERIOR  LAT LUMBAR FUSION N/A 05/01/2020   Procedure: ANTERIOR LATERAL LUMBAR FUSION L4-S1;  Surgeon: [name], MD;  Location: [facility] OR;  Service: Orthopedics;  Laterality: N/A;  4.5 hrs Dr. Oris to do approach tap block with exparel    ANTERIOR LAT LUMBAR FUSION N/A 04/03/2024   Procedure: EXTREME LATERAL INTERBODY FUSION LUMBAR TWO - THREE TO LUMBAR THREE - FOUR;  Surgeon: [name], MD;  Location: [facility] OR;  Service: Orthopedics;  Laterality: N/A;  XLIF L2-3, L3-4   CARPAL TUNNEL RELEASE Bilateral 1990's   COLONOSCOPY     KNEE ARTHROSCOPY W/ MENISCECTOMY Left 07/2014   LEFT HEART CATHETERIZATION WITH CORONARY ANGIOGRAM N/A 02/06/2013   Procedure: LEFT HEART CATHETERIZATION WITH CORONARY ANGIOGRAM;  Surgeon: Deatrice DELENA Cage, MD;  Location: [facility] CATH LAB;  Service: Cardiovascular;  Laterality: N/A;  non-obstruction 25% mLAD,  normal LVF , ef 65-70%   NASAL SINUS SURGERY     OVARIAN CYST REMOVAL Left    ROTATOR CUFF REPAIR Right 10/2016   TOTAL KNEE ARTHROPLASTY Left 03/26/2015   Procedure: LEFT TOTAL KNEE ARTHROPLASTY;  Surgeon: Donnice Car, MD;  Location: [facility] ORS;  Service: Orthopedics;  Laterality: Left;   Patient Active Problem List   Diagnosis Date Noted   Pulmonary embolism (HCC) 04/27/2024   Acute pulmonary embolism (HCC) 04/25/2024   Acute deep vein thrombosis (DVT) of left lower extremity (HCC) 04/25/2024   S/P lumbar fusion 05/01/2020   Hypertension 11/20/2019   Family history of early CAD 07/15/2017   Elevated blood pressure reading in office without diagnosis of hypertension 07/15/2017   Obese 03/27/2015   S/P left TKA 03/26/2015   S/P knee replacement 03/26/2015   Colitis, acute 12/01/2014   Weight loss, unintentional 12/01/2014   Adnexal cyst 12/01/2014   Elevated fasting glucose 12/01/2014   CAD (coronary artery disease) 12/01/2014   Osteoarthritis 12/01/2014   Vasovagal attack 02/28/2013   Hyperlipidemia 02/28/2013    PCP: Prentice Blush MD  REFERRING PROVIDER: Donaciano Sprang MD  REFERRING DIAG: M54.50 (ICD-10-CM) - Low back pain, unspecified   Rationale for Evaluation and Treatment: Rehabilitation  THERAPY DIAG:  Other low back pain  Muscle weakness (generalized)  ONSET DATE: June 2025  SUBJECTIVE:                                                                                                                                                                                           SUBJECTIVE STATEMENT: No changes . Haven't had much back pain for the last 3 days or so 0/10  Initial Subjective 3 weeks post surgery bl clots lung and lle.  On bl thinner better. Trying to ride elliptical up towards 40 mins a day at home in 2 sessions.  I do have some residual circulation problems through LLE primarily ankle and anterior calf area.  Prior to surgery I had right sided nerve pain into right knee and some calf now is is better. Only have pain there now after lying down at night after about 10 minutes.  I get up more than 5-6 times nightly due to pain.  No pain during day when standing. I have tendonitis both ankle chronic, limits some of my walking more than my back. Not as steady  PERTINENT HISTORY:  Surgery 04/03/24: 09/05/24 Dr sprang Pt is now 5 months out from a XLIF L2-4 with supplemental pedicle screw fixation L2-S1. Patient has had a prior L4-S1 instrumented fusion and we extended this to L2 .  04/25/2024 she was admitted to the hospital and diagnosed with a PE and lower extremity DVT   L TKR R Knee OA   PAIN:  Are you having pain? Yes: NPRS scale: current 1/10; 6/10 at night Pain location:  lower back with radiation into rle (in supine) Pain description: ache  Aggravating factors: supine/lying down Relieving factors: lying down short while  Tendonitis/oa in ankles  pain 0/10; worst: 6/10 Walking/standing > 30 minutes Ice or pain cream   PRECAUTIONS: Fall   RED FLAGS: None      WEIGHT BEARING RESTRICTIONS: No   FALLS:  Has patient  fallen in last 6 months? No   LIVING ENVIRONMENT: Lives with: lives with their spouse Lives in: House/apartment Stairs: 3-4 step going into home no handrail Has following equipment at home: None   OCCUPATION: retired.   PLOF: Independent  PATIENT GOALS: sleeping better, energy, walking further, improve balance  NEXT MD VISIT: 6 months  OBJECTIVE:  Note: Objective measures were completed at Evaluation unless otherwise noted.  DIAGNOSTIC FINDINGS:  NA  PATIENT SURVEYS:  ODI  COGNITION: Overall cognitive status: Within functional limits for tasks assessed     SENSATION: WFL  MUSCLE LENGTH: Hamstrings: wfl   POSTURE: rounded shoulders, forward head, and decreased lumbar lordosis  PALPATION: No TTP  LUMBAR ROM:   AROM eval  Flexion NT  Extension wfl  Right lateral flexion wfl  Left lateral flexion Wfl Mild discomfort  Right rotation   Left rotation    (Blank rows = not tested)  LOWER EXTREMITY ROM:     wfl   LOWER EXTREMITY MMT:    MMT Right eval Left eval  Hip flexion 24.0 26.6  Hip extension    Hip abduction 23.7 25.6  Hip adduction    Hip internal rotation    Hip external rotation    Knee flexion 4 4  Knee extension    Ankle dorsiflexion 4 4  Ankle plantarflexion    Ankle inversion    Ankle eversion     (Blank rows = not tested)  LUMBAR SPECIAL TESTS:  Straight leg raise test: Negative  FUNCTIONAL TESTS:  5 times sit to stand: 17.54 Timed up and go (TUG): 13.57. Pt needs extra time to rise then lower from chair 4 stage balance: passed 1&2.  Tandem unsteady but able to hold 15s.  SLS x 3 s   GAIT: Distance walked: 500 ft Assistive device utilized: None Level of assistance: Complete Independence Comments: wfl  TREATMENT  [facility] Adult PT Treatment:                                                DATE: 09/27/24 Pt seen for aquatic therapy today.  Treatment took place in water 3.5-4.75 ft in depth at the [facility] pool. Temp  of water was 91.  Pt entered/exited the pool via stairs using alternating pattern with hand rail.  *walking forward, back and side stepping in 3.6-4.2 unsupported *Ue support on yellow HB 4.0: toe raises; heel raises;  *L stretch *HS, quad and gastroc stretch at steps. *Ue support yellow HB: hip add/abd crossing midline; hip flex/ extension *suitcase carry using yellow HB marching forward/back bilateral then unilateral *decompression yellow noodle: cycling; hip add/abd then flex/ext.  Pt requires the buoyancy and hydrostatic pressure of water for support, and to offload joints by unweighting joint load by at least 50 % in navel deep water and by at least 75-80% in chest to neck deep water.  Viscosity of the water is needed for resistance of strengthening. Water current perturbations provides challenge to standing balance requiring increased core activation.  PATIENT EDUCATION:  Education details: Discussed eval findings, rehab rationale, aquatic program progression/POC and pools in area. Patient is in agreement  Person educated: Patient Education method: Explanation Education comprehension: verbalized understanding  HOME EXERCISE PROGRAM: Issued last episode: AQUATIC Access Code: [email or web address] URL: [email or web address] Date: 02/14/2024  ASSESSMENT:  CLINICAL IMPRESSION: Pt reports good response from last aquatic session.  Slight increase in pain today due to setting up Christmas decorations last few days.   Progressed core strengthening with good tolerance. Some focus on dynamic balance with good execution and without LOB. Goals ongoing     Initial Impression Patient is a 72 y.o. f who was seen today for physical therapy evaluation and treatment for LBP.  She is well know to this clinic as she had an episode of aquatic intervention prior to  most recent LB surgery in early June.  She presents today with improved pain sensitivity  in LB and rle since surgical procedure (XLIF L2-4). Pain is reported at night with supine or side lying and completely resolved with standing/moving. She is limited in forward lumbar flex and has some strength deficits in LE.  He balance is lacking some but has had no falls. She feels her greatest limitation is her bilat ankle tendonitis and OA (as per pt) causing pain with amb and limiting her. She will benefit from skilled aquatic PT intervention to improve lumbar mobility, balance and reduce residual pain with intention to improve ability to rest at night and return to desired level of function. She reports an active lifestyle with her grandchildren and walking/shopping.  OBJECTIVE IMPAIRMENTS: decreased activity tolerance, decreased balance, decreased endurance, decreased mobility, difficulty walking, decreased ROM, decreased strength, impaired flexibility, postural dysfunction, and pain.   ACTIVITY LIMITATIONS: carrying, lifting, bending, squatting, sleeping, stairs, transfers, and locomotion level  PARTICIPATION LIMITATIONS: shopping, community activity, and yard work  [facility] POTENTIAL: Good  CLINICAL DECISION MAKING: Evolving/moderate complexity  EVALUATION COMPLEXITY: Moderate   GOALS: Goals reviewed with patient? Yes  SHORT TERM GOALS: Target date: 10/04/24  Pt will tolerate full aquatic sessions consistently without increase in pain and with improving function to demonstrate good toleration and effectiveness of intervention.  Baseline: Goal status: INITIAL  2.  Pt will tolerate stair climbing using alternating pattern ascending and descending 6 steps without use of handrail Baseline:  Goal status: INITIAL  3.  Pt will report improved toleration to resting/sleeping in recliner vs bed for improved resting as nerves heal Baseline:  Goal status: INITIAL   LONG TERM GOALS: Target date:  11/07/24  Pt to improve on ODI by  13 % to demonstrate statistically significant Improvement in function. (MCID 13-15%) Baseline: 19/50=38% Goal status: INITIAL  2.  Pt will improve amb toleration to > 1 hour without limitation of ankle pain by 50% Baseline:  Goal status: INITIAL  3. Pt will improve strength in all areas listed by  5  to demonstrate improved overall physical function Baseline: see chart Goal status: INITIAL  4.  Pt will report reduced frequency of waking at night to <3x Baseline: >6 Goal status: INITIAL  5.  Pt will improve on 5 X STS test to <or= 12s to demonstrate improving functional lower extremity strength, transitional movements, and balance. (MDC = 4.2sec)  Baseline: 17.54 Goal status: INITIAL  PLAN:  PT FREQUENCY: 1-2x/week  PT DURATION: 8 weeks  PLANNED INTERVENTIONS: [id number]- PT Re-evaluation, 97750- Physical Performance Testing, 97110-Therapeutic exercises, 97530- Therapeutic activity, [id number]- Neuromuscular re-education, 97535- Self Care, [id number]- Manual therapy, [id number]- Gait training, [phone]- Aquatic  Therapy, 20560 (1-2 muscles), 20561 (3+ muscles)- Dry Needling, Patient/Family education, Balance training, Stair training, Taping, Joint mobilization, DME instructions, Cryotherapy, and Moist heat.  PLAN FOR NEXT SESSION: aquatics for lumbosacral strengthening,, pain management, stretching,    Ronal Foots) Henrietta Cieslewicz MPT 09/27/24 11:50 AM [facility] [address] [place], [place], [address] Phone: [phone]   Fax:  [phone]   Referring diagnosis? Low back pain Treatment diagnosis? (if different than referring diagnosis) added ankle pain What was this (referring dx) caused by? [x]  Surgery []  Fall [x]  Ongoing issue []  Arthritis []  Other: ____________  Laterality: []  Rt []  Lt [x]  Both  Check all possible CPT codes:  *CHOOSE 10 OR LESS*    See Planned Interventions listed in the Plan section  of the Evaluation.

## 2024-10-04 ENCOUNTER — Ambulatory Visit (HOSPITAL_BASED_OUTPATIENT_CLINIC_OR_DEPARTMENT_OTHER): Admitting: Physical Therapy

## 2024-10-06 ENCOUNTER — Ambulatory Visit (HOSPITAL_BASED_OUTPATIENT_CLINIC_OR_DEPARTMENT_OTHER): Admission: RE | Admit: 2024-10-06 | Discharge: 2024-10-06 | Attending: Vascular Surgery

## 2024-10-06 DIAGNOSIS — I87002 Postthrombotic syndrome without complications of left lower extremity: Secondary | ICD-10-CM

## 2024-10-06 LAB — POCT I-STAT CREATININE: Creatinine, Ser: 1.1 mg/dL — ABNORMAL HIGH (ref 0.44–1.00)

## 2024-10-06 MED ORDER — IOHEXOL 350 MG/ML SOLN
125.0000 mL | Freq: Once | INTRAVENOUS | Status: AC | PRN
  Administered 2024-10-06: 125 mL via INTRAVENOUS

## 2024-10-09 ENCOUNTER — Ambulatory Visit (HOSPITAL_BASED_OUTPATIENT_CLINIC_OR_DEPARTMENT_OTHER): Admitting: Physical Therapy

## 2024-10-23 ENCOUNTER — Ambulatory Visit (HOSPITAL_BASED_OUTPATIENT_CLINIC_OR_DEPARTMENT_OTHER): Admitting: Physical Therapy

## 2024-10-23 ENCOUNTER — Encounter (HOSPITAL_BASED_OUTPATIENT_CLINIC_OR_DEPARTMENT_OTHER): Payer: Self-pay | Admitting: Physical Therapy

## 2024-10-23 DIAGNOSIS — M5459 Other low back pain: Secondary | ICD-10-CM | POA: Diagnosis not present

## 2024-10-23 DIAGNOSIS — M6281 Muscle weakness (generalized): Secondary | ICD-10-CM

## 2024-10-23 NOTE — Therapy (Signed)
 " OUTPATIENT PHYSICAL THERAPY THORACOLUMBAR TREATMENT   Patient Name: Carol Cox MRN: [id number] DOB:September 25, 1951, 73 y.o., female Today's Date: 10/23/2024  END OF SESSION:  PT End of Session - 10/23/24 0928     Visit Number 4    Date for Recertification  11/10/24    Authorization Type [employer] m[employer]    Authorization Time Period 18 visits approved  From 11.20.2025 - 01.16.2026  Authorization #[id number]   [employer] ([employer])    Authorization - Number of Visits 18    Progress Note Due on Visit 10    PT Start Time 0930    PT Stop Time 1010    PT Time Calculation (min) 40 min    Behavior During Therapy [facility] for tasks assessed/performed            Past Medical History:  Diagnosis Date   Allergy    seasonal   Arthritis    knee, left hip, ankles    Borderline glaucoma    CAD (coronary artery disease)    Mild nonobstructive CAD by cath 02/06/13 (25% mid LAD), normal EF   Chronic low back pain    Colitis    Coronary arteriosclerosis    DDD (degenerative disc disease), lumbar    Degeneration of intervertebral disc of lumbar region    Family history of adverse reaction to anesthesia    mother has problems with nausea and vomiting    Glaucoma    History of colitis    02/ 2016  acute infectious colitis-- resolved   History of total knee arthroplasty    Hyperglycemia    Hyperlipidemia    Hypertension    Left ovarian cyst    Low back pain    with bilateral hip radiation    Lumbar radiculopathy    Lumbar spondylolysis    Obesity    Osteoporosis    ospenia of hips   Pain in joint of left shoulder    Pain of right hip joint    PONV (postoperative nausea and vomiting)    SEVERE   Past Surgical History:  Procedure Laterality Date   ABDOMINAL EXPOSURE N/A 05/01/2020   Procedure: ABDOMINAL EXPOSURE;  Surgeon: Oris Krystal FALCON, MD;  Location: [facility] OR;  Service: Vascular;  Laterality: N/A;   ANTERIOR LAT LUMBAR FUSION N/A 05/01/2020   Procedure: ANTERIOR LATERAL LUMBAR FUSION L4-S1;   Surgeon: [name], MD;  Location: [facility] OR;  Service: Orthopedics;  Laterality: N/A;  4.5 hrs Dr. Oris to do approach tap block with exparel    ANTERIOR LAT LUMBAR FUSION N/A 04/03/2024   Procedure: EXTREME LATERAL INTERBODY FUSION LUMBAR TWO - THREE TO LUMBAR THREE - FOUR;  Surgeon: [name], MD;  Location: [facility] OR;  Service: Orthopedics;  Laterality: N/A;  XLIF L2-3, L3-4   CARPAL TUNNEL RELEASE Bilateral 1990's   COLONOSCOPY     KNEE ARTHROSCOPY W/ MENISCECTOMY Left 07/2014   LEFT HEART CATHETERIZATION WITH CORONARY ANGIOGRAM N/A 02/06/2013   Procedure: LEFT HEART CATHETERIZATION WITH CORONARY ANGIOGRAM;  Surgeon: Deatrice DELENA Cage, MD;  Location: [facility] CATH LAB;  Service: Cardiovascular;  Laterality: N/A;  non-obstruction 25% mLAD,  normal LVF , ef 65-70%   NASAL SINUS SURGERY     OVARIAN CYST REMOVAL Left    ROTATOR CUFF REPAIR Right 10/2016   TOTAL KNEE ARTHROPLASTY Left 03/26/2015   Procedure: LEFT TOTAL KNEE ARTHROPLASTY;  Surgeon: Donnice Car, MD;  Location: [facility] ORS;  Service: Orthopedics;  Laterality: Left;   Patient Active Problem List   Diagnosis Date  Noted   Pulmonary embolism (HCC) 04/27/2024   Acute pulmonary embolism (HCC) 04/25/2024   Acute deep vein thrombosis (DVT) of left lower extremity (HCC) 04/25/2024   S/P lumbar fusion 05/01/2020   Hypertension 11/20/2019   Family history of early CAD 07/15/2017   Elevated blood pressure reading in office without diagnosis of hypertension 07/15/2017   Obese 03/27/2015   S/P left TKA 03/26/2015   S/P knee replacement 03/26/2015   Colitis, acute 12/01/2014   Weight loss, unintentional 12/01/2014   Adnexal cyst 12/01/2014   Elevated fasting glucose 12/01/2014   CAD (coronary artery disease) 12/01/2014   Osteoarthritis 12/01/2014   Vasovagal attack 02/28/2013   Hyperlipidemia 02/28/2013    PCP: Prentice Blush MD  REFERRING PROVIDER: Donaciano Sprang MD  REFERRING DIAG: M54.50 (ICD-10-CM) - Low back pain, unspecified    Rationale for Evaluation and Treatment: Rehabilitation  THERAPY DIAG:  Other low back pain  Muscle weakness (generalized)  ONSET DATE: June 2025  SUBJECTIVE:                                                                                                                                                                                           SUBJECTIVE STATEMENT: Pt reports she has follow up with vascular Dr; potential surgery to address the blockage in the stomach, in the vein.  Pt reports she has been away from PT due to cataract surgery; dr wouldn't allow her to get in pool. She continues to do 20 min on elliptical 2x/day. Now only waking 2x at night due to pain.   Initial Subjective 3 weeks post surgery bl clots lung and lle.  On bl thinner better. Trying to ride elliptical up towards 40 mins a day at home in 2 sessions.  I do have some residual circulation problems through LLE primarily ankle and anterior calf area.  Prior to surgery I had right sided nerve pain into right knee and some calf now is is better. Only have pain there now after lying down at night after about 10 minutes.  I get up more than 5-6 times nightly due to pain.  No pain during day when standing. I have tendonitis both ankle chronic, limits some of my walking more than my back. Not as steady  PERTINENT HISTORY:  Surgery 04/03/24: 09/05/24 Dr sprang Pt is now 5 months out from a XLIF L2-4 with supplemental pedicle screw fixation L2-S1. Patient has had a prior L4-S1 instrumented fusion and we extended this to L2 .  04/25/2024 she was admitted to the hospital and diagnosed with a PE and lower extremity DVT   L TKR R Knee OA  PAIN:  Are you having pain? Yes: NPRS scale: 0/10 Pain location:  Pain description:  Aggravating factors: supine/lying down Relieving factors: lying down short while     PRECAUTIONS: Fall   RED FLAGS: None      WEIGHT BEARING RESTRICTIONS: No   FALLS:  Has patient fallen in  last 6 months? No   LIVING ENVIRONMENT: Lives with: lives with their spouse Lives in: House/apartment Stairs: 3-4 step going into home no handrail Has following equipment at home: None   OCCUPATION: retired.   PLOF: Independent  PATIENT GOALS: sleeping better, energy, walking further, improve balance  NEXT MD VISIT: 6 months  OBJECTIVE:  Note: Objective measures were completed at Evaluation unless otherwise noted.  DIAGNOSTIC FINDINGS:  NA  PATIENT SURVEYS:  ODI  COGNITION: Overall cognitive status: Within functional limits for tasks assessed     SENSATION: WFL  MUSCLE LENGTH: Hamstrings: wfl   POSTURE: rounded shoulders, forward head, and decreased lumbar lordosis  PALPATION: No TTP  LUMBAR ROM:   AROM eval  Flexion NT  Extension wfl  Right lateral flexion wfl  Left lateral flexion Wfl Mild discomfort  Right rotation   Left rotation    (Blank rows = not tested)  LOWER EXTREMITY ROM:     wfl   LOWER EXTREMITY MMT:    MMT Right eval Left eval  Hip flexion 24.0 26.6  Hip extension    Hip abduction 23.7 25.6  Hip adduction    Hip internal rotation    Hip external rotation    Knee flexion 4 4  Knee extension    Ankle dorsiflexion 4 4  Ankle plantarflexion    Ankle inversion    Ankle eversion     (Blank rows = not tested)  LUMBAR SPECIAL TESTS:  Straight leg raise test: Negative  FUNCTIONAL TESTS:  5 times sit to stand: 17.54 Timed up and go (TUG): 13.57. Pt needs extra time to rise then lower from chair 4 stage balance: passed 1&2.  Tandem unsteady but able to hold 15s.  SLS x 3 s   GAIT: Distance walked: 500 ft Assistive device utilized: None Level of assistance: Complete Independence Comments: wfl  TREATMENT  [facility] Adult PT Treatment:                                                DATE: 10/23/24 Pt seen for aquatic therapy today.  Treatment took place in water 3.5-4.75 ft in depth at the [facility] pool. Temp of water  was 91.  Pt entered/exited the pool via stairs using alternating pattern with hand rail.  *unsupported walking forward, backward and side stepping  * suitcase carry using single yellow hand float marching forward/back * side stepping with arm add/abdct with yellow hand floats  *UE on yellow hand floats: toe/heel raises x 12; hip abdct/add x 10 each LE; hip flexion/extension x 10 each LE * TrA set with short hollow noodle tap to same side/opp side knees while marching forward/ backward  * staggered stance with long hollow noodle pull down to thighs *decompression yellow noodle under arms behind back: cycling; hip add/abd then flex/extension  Pt requires the buoyancy and hydrostatic pressure of water for support, and to offload joints by unweighting joint load by at least 50 % in navel deep water and by at least 75-80% in chest to neck deep  water.  Viscosity of the water is needed for resistance of strengthening. Water current perturbations provides challenge to standing balance requiring increased core activation.                                                                                                                                  PATIENT EDUCATION:  Education details: reacquainting to aquatic therapy Person educated: Patient Education method: Explanation Education comprehension: verbalized understanding  HOME EXERCISE PROGRAM: Issued last episode: AQUATIC Access Code: [email or web address] URL: [email or web address] Date: 02/14/2024  ASSESSMENT:  CLINICAL IMPRESSION: Pt reports good tolerance for aquatic exercises without any production of symptoms.  Reviewed exercises completed in previous sessions.    Pt has met STG1 and 3.      Initial Impression Patient is a 73 y.o. f who was seen today for physical therapy evaluation and treatment for LBP.  She is well know to this clinic as she had an episode of aquatic intervention prior to most recent LB surgery in early  June.  She presents today with improved pain sensitivity  in LB and rle since surgical procedure (XLIF L2-4). Pain is reported at night with supine or side lying and completely resolved with standing/moving. She is limited in forward lumbar flex and has some strength deficits in LE.  He balance is lacking some but has had no falls. She feels her greatest limitation is her bilat ankle tendonitis and OA (as per pt) causing pain with amb and limiting her. She will benefit from skilled aquatic PT intervention to improve lumbar mobility, balance and reduce residual pain with intention to improve ability to rest at night and return to desired level of function. She reports an active lifestyle with her grandchildren and walking/shopping.  OBJECTIVE IMPAIRMENTS: decreased activity tolerance, decreased balance, decreased endurance, decreased mobility, difficulty walking, decreased ROM, decreased strength, impaired flexibility, postural dysfunction, and pain.   ACTIVITY LIMITATIONS: carrying, lifting, bending, squatting, sleeping, stairs, transfers, and locomotion level  PARTICIPATION LIMITATIONS: shopping, community activity, and yard work  [facility] POTENTIAL: Good  CLINICAL DECISION MAKING: Evolving/moderate complexity  EVALUATION COMPLEXITY: Moderate   GOALS: Goals reviewed with patient? Yes  SHORT TERM GOALS: Target date: 10/04/24  Pt will tolerate full aquatic sessions consistently without increase in pain and with improving function to demonstrate good toleration and effectiveness of intervention.  Baseline: Goal status: MET- 10/23/24  2.  Pt will tolerate stair climbing using alternating pattern ascending and descending 6 steps without use of handrail Baseline:  Goal status: In PRogress - 10/23/24  3.  Pt will report improved toleration to resting/sleeping in recliner vs bed for improved resting as nerves heal Baseline:  Goal status: MET- 10/23/24   LONG TERM GOALS: Target date:  11/07/24  Pt to improve on ODI by  13 % to demonstrate statistically significant Improvement in function. (MCID 13-15%) Baseline: 19/50=38% Goal status: INITIAL  2.  Pt will improve amb toleration to > 1 hour without  limitation of ankle pain by 50% Baseline:  Goal status: INITIAL  3. Pt will improve strength in all areas listed by  5  to demonstrate improved overall physical function Baseline: see chart Goal status: INITIAL  4.  Pt will report reduced frequency of waking at night to <3x Baseline: >6 Goal status: INITIAL  5.  Pt will improve on 5 X STS test to <or= 12s to demonstrate improving functional lower extremity strength, transitional movements, and balance. (MDC = 4.2sec)  Baseline: 17.54 Goal status: INITIAL  PLAN:  PT FREQUENCY: 1-2x/week  PT DURATION: 8 weeks  PLANNED INTERVENTIONS: 97164- PT Re-evaluation, 97750- Physical Performance Testing, 97110-Therapeutic exercises, 97530- Therapeutic activity, [id number]- Neuromuscular re-education, 97535- Self Care, [id number]- Manual therapy, [id number]- Gait training, [phone]- Aquatic Therapy, [phone] (1-2 muscles), [id number] (3+ muscles)- Dry Needling, Patient/Family education, Balance training, Stair training, Taping, Joint mobilization, DME instructions, Cryotherapy, and Moist heat.  PLAN FOR NEXT SESSION: aquatics for lumbosacral strengthening,, pain management, stretching,    Delon Aquas, PTA 10/23/2024 1:09 PM [facility] [address] [place], [place], [address] Phone: [phone]   Fax:  [phone]   Referring diagnosis? Low back pain Treatment diagnosis? (if different than referring diagnosis) added ankle pain What was this (referring dx) caused by? [x]  Surgery []  Fall [x]  Ongoing issue []  Arthritis []  Other: ____________  Laterality: []  Rt []  Lt [x]  Both  Check all possible CPT codes:  *CHOOSE 10 OR LESS*    See Planned Interventions listed in the Plan  section of the Evaluation.    "

## 2024-10-25 ENCOUNTER — Encounter (HOSPITAL_BASED_OUTPATIENT_CLINIC_OR_DEPARTMENT_OTHER): Payer: Self-pay | Admitting: Physical Therapy

## 2024-10-25 ENCOUNTER — Ambulatory Visit (HOSPITAL_BASED_OUTPATIENT_CLINIC_OR_DEPARTMENT_OTHER): Admitting: Physical Therapy

## 2024-10-25 DIAGNOSIS — M5459 Other low back pain: Secondary | ICD-10-CM | POA: Diagnosis not present

## 2024-10-25 DIAGNOSIS — M6281 Muscle weakness (generalized): Secondary | ICD-10-CM

## 2024-10-25 NOTE — Therapy (Signed)
 " OUTPATIENT PHYSICAL THERAPY THORACOLUMBAR TREATMENT   Patient Name: Carol Cox MRN: [id number] DOB:1951/01/22, 73 y.o., female Today's Date: 10/25/2024  END OF SESSION:  PT End of Session - 10/25/24 1027     Visit Number 5    Date for Recertification  11/10/24    Authorization Type [employer] m[employer]    Authorization Time Period 18 visits approved  From 11.20.2025 - 01.16.2026  Authorization #[id number]   [employer] ([employer])    Authorization - Number of Visits 18    Progress Note Due on Visit 10    PT Start Time 1015    PT Stop Time 1055    PT Time Calculation (min) 40 min    Activity Tolerance Patient tolerated treatment well    Behavior During Therapy WFL for tasks assessed/performed            Past Medical History:  Diagnosis Date   Allergy    seasonal   Arthritis    knee, left hip, ankles    Borderline glaucoma    CAD (coronary artery disease)    Mild nonobstructive CAD by cath 02/06/13 (25% mid LAD), normal EF   Chronic low back pain    Colitis    Coronary arteriosclerosis    DDD (degenerative disc disease), lumbar    Degeneration of intervertebral disc of lumbar region    Family history of adverse reaction to anesthesia    mother has problems with nausea and vomiting    Glaucoma    History of colitis    02/ 2016  acute infectious colitis-- resolved   History of total knee arthroplasty    Hyperglycemia    Hyperlipidemia    Hypertension    Left ovarian cyst    Low back pain    with bilateral hip radiation    Lumbar radiculopathy    Lumbar spondylolysis    Obesity    Osteoporosis    ospenia of hips   Pain in joint of left shoulder    Pain of right hip joint    PONV (postoperative nausea and vomiting)    SEVERE   Past Surgical History:  Procedure Laterality Date   ABDOMINAL EXPOSURE N/A 05/01/2020   Procedure: ABDOMINAL EXPOSURE;  Surgeon: Oris Krystal FALCON, MD;  Location: [facility] OR;  Service: Vascular;  Laterality: N/A;   ANTERIOR LAT LUMBAR FUSION N/A 05/01/2020    Procedure: ANTERIOR LATERAL LUMBAR FUSION L4-S1;  Surgeon: [name], MD;  Location: [facility] OR;  Service: Orthopedics;  Laterality: N/A;  4.5 hrs Dr. Oris to do approach tap block with exparel    ANTERIOR LAT LUMBAR FUSION N/A 04/03/2024   Procedure: EXTREME LATERAL INTERBODY FUSION LUMBAR TWO - THREE TO LUMBAR THREE - FOUR;  Surgeon: [name], MD;  Location: [facility] OR;  Service: Orthopedics;  Laterality: N/A;  XLIF L2-3, L3-4   CARPAL TUNNEL RELEASE Bilateral 1990's   COLONOSCOPY     KNEE ARTHROSCOPY W/ MENISCECTOMY Left 07/2014   LEFT HEART CATHETERIZATION WITH CORONARY ANGIOGRAM N/A 02/06/2013   Procedure: LEFT HEART CATHETERIZATION WITH CORONARY ANGIOGRAM;  Surgeon: Deatrice DELENA Cage, MD;  Location: [facility] CATH LAB;  Service: Cardiovascular;  Laterality: N/A;  non-obstruction 25% mLAD,  normal LVF , ef 65-70%   NASAL SINUS SURGERY     OVARIAN CYST REMOVAL Left    ROTATOR CUFF REPAIR Right 10/2016   TOTAL KNEE ARTHROPLASTY Left 03/26/2015   Procedure: LEFT TOTAL KNEE ARTHROPLASTY;  Surgeon: Donnice Car, MD;  Location: [facility] ORS;  Service: Orthopedics;  Laterality: Left;  Patient Active Problem List   Diagnosis Date Noted   Pulmonary embolism (HCC) 04/27/2024   Acute pulmonary embolism (HCC) 04/25/2024   Acute deep vein thrombosis (DVT) of left lower extremity (HCC) 04/25/2024   S/P lumbar fusion 05/01/2020   Hypertension 11/20/2019   Family history of early CAD 07/15/2017   Elevated blood pressure reading in office without diagnosis of hypertension 07/15/2017   Obese 03/27/2015   S/P left TKA 03/26/2015   S/P knee replacement 03/26/2015   Colitis, acute 12/01/2014   Weight loss, unintentional 12/01/2014   Adnexal cyst 12/01/2014   Elevated fasting glucose 12/01/2014   CAD (coronary artery disease) 12/01/2014   Osteoarthritis 12/01/2014   Vasovagal attack 02/28/2013   Hyperlipidemia 02/28/2013    PCP: Prentice Blush MD  REFERRING PROVIDER: Donaciano Sprang MD  REFERRING DIAG:  M54.50 (ICD-10-CM) - Low back pain, unspecified   Rationale for Evaluation and Treatment: Rehabilitation  THERAPY DIAG:  Other low back pain  Muscle weakness (generalized)  ONSET DATE: June 2025  SUBJECTIVE:                                                                                                                                                                                           SUBJECTIVE STATEMENT: Pt reports she went up 6 stairs (in reciprocal pattern) without rail yesterday, I'm going to start working on that at home. Pt reports she overdid it yesterday cleaning; back is sore today.   Initial Subjective 3 weeks post surgery bl clots lung and lle.  On bl thinner better. Trying to ride elliptical up towards 40 mins a day at home in 2 sessions.  I do have some residual circulation problems through LLE primarily ankle and anterior calf area.  Prior to surgery I had right sided nerve pain into right knee and some calf now is is better. Only have pain there now after lying down at night after about 10 minutes.  I get up more than 5-6 times nightly due to pain.  No pain during day when standing. I have tendonitis both ankle chronic, limits some of my walking more than my back. Not as steady  PERTINENT HISTORY:  Surgery 04/03/24: 09/05/24 Dr sprang Pt is now 5 months out from a XLIF L2-4 with supplemental pedicle screw fixation L2-S1. Patient has had a prior L4-S1 instrumented fusion and we extended this to L2 .  04/25/2024 she was admitted to the hospital and diagnosed with a PE and lower extremity DVT   L TKR R Knee OA   PAIN:  Are you having pain? Yes: NPRS scale: 5/10 Pain location: lower back   Pain description:  sore Aggravating factors: supine/lying down Relieving factors: lying down short while     PRECAUTIONS: Fall   RED FLAGS: None      WEIGHT BEARING RESTRICTIONS: No   FALLS:  Has patient fallen in last 6 months? No   LIVING ENVIRONMENT: Lives with:  lives with their spouse Lives in: House/apartment Stairs: 3-4 step going into home no handrail Has following equipment at home: None   OCCUPATION: retired.   PLOF: Independent  PATIENT GOALS: sleeping better, energy, walking further, improve balance  NEXT MD VISIT: 6 months  OBJECTIVE:  Note: Objective measures were completed at Evaluation unless otherwise noted.  DIAGNOSTIC FINDINGS:  NA  PATIENT SURVEYS:  ODI  COGNITION: Overall cognitive status: Within functional limits for tasks assessed     SENSATION: WFL  MUSCLE LENGTH: Hamstrings: wfl   POSTURE: rounded shoulders, forward head, and decreased lumbar lordosis  PALPATION: No TTP  LUMBAR ROM:   AROM eval  Flexion NT  Extension wfl  Right lateral flexion wfl  Left lateral flexion Wfl Mild discomfort  Right rotation   Left rotation    (Blank rows = not tested)  LOWER EXTREMITY ROM:     wfl   LOWER EXTREMITY MMT:    MMT Right eval Left eval  Hip flexion 24.0 26.6  Hip extension    Hip abduction 23.7 25.6  Hip adduction    Hip internal rotation    Hip external rotation    Knee flexion 4 4  Knee extension    Ankle dorsiflexion 4 4  Ankle plantarflexion    Ankle inversion    Ankle eversion     (Blank rows = not tested)  LUMBAR SPECIAL TESTS:  Straight leg raise test: Negative  FUNCTIONAL TESTS:  5 times sit to stand: 17.54 Timed up and go (TUG): 13.57. Pt needs extra time to rise then lower from chair 4 stage balance: passed 1&2.  Tandem unsteady but able to hold 15s.  SLS x 3 s   GAIT: Distance walked: 500 ft Assistive device utilized: None Level of assistance: Complete Independence Comments: wfl  TREATMENT  [facility] Adult PT Treatment:                                                DATE: 10/25/24  Therapeutic Exercise: Elliptical L1 x 5 min  Review of current exercises she completes (SLR - encouraged to bend opposite knee; single knee to chest- encouraged to bend opposite knee;  hip abdct in sidelying) Quadruped opposite arm/ leg lifts x 5 each side (some pain in Lt knee with kneeling) Standing bil shoulder ext with red band in staggered stance with TrA set x 10 each LE forward Standing bil low row with green band in staggered stance with TrA set x 10 each LE forward Bow and arrow with step back with TrA set x 10 each LE forward, green band Hip abdct in sidelying with small pulses x 12 each LE, core engaged Bridge with 5 sec hold x 10 Hook lying piriformis stretch 20 sec    [facility] Adult PT Treatment:                                                DATE: 10/23/24  Pt seen for aquatic therapy today.  Treatment took place in water 3.5-4.75 ft in depth at the [facility] pool. Temp of water was 91.  Pt entered/exited the pool via stairs using alternating pattern with hand rail.  *unsupported walking forward, backward and side stepping  * suitcase carry using single yellow hand float marching forward/back * side stepping with arm add/abdct with yellow hand floats  *UE on yellow hand floats: toe/heel raises x 12; hip abdct/add x 10 each LE; hip flexion/extension x 10 each LE * TrA set with short hollow noodle tap to same side/opp side knees while marching forward/ backward  * staggered stance with long hollow noodle pull down to thighs *decompression yellow noodle under arms behind back: cycling; hip add/abd then flex/extension  Pt requires the buoyancy and hydrostatic pressure of water for support, and to offload joints by unweighting joint load by at least 50 % in navel deep water and by at least 75-80% in chest to neck deep water.  Viscosity of the water is needed for resistance of strengthening. Water current perturbations provides challenge to standing balance requiring increased core activation.                                                                                                                                  PATIENT EDUCATION:  Education  details: reacquainting to aquatic therapy Person educated: Patient Education method: Explanation Education comprehension: verbalized understanding  HOME EXERCISE PROGRAM: Issued last episode: AQUATIC Access Code: [email or web address] URL: [email or web address] Date: 02/14/2024  Land Access Code: [email or web address] URL: [email or web address] Date: 10/25/2024  - issued with red and green band   ASSESSMENT:  CLINICAL IMPRESSION: Pt requested session to include land exercises.  Reviewed form of her current exercises she performs and gave feedback on minor corrections to avoid increased back pain.  Pt issued updated land HEP.  Pt tolerated all exercises without increase in LBP.  Will continue to progress as tolerated.      Initial Impression Patient is a 73 y.o. f who was seen today for physical therapy evaluation and treatment for LBP.  She is well know to this clinic as she had an episode of aquatic intervention prior to most recent LB surgery in early June.  She presents today with improved pain sensitivity  in LB and rle since surgical procedure (XLIF L2-4). Pain is reported at night with supine or side lying and completely resolved with standing/moving. She is limited in forward lumbar flex and has some strength deficits in LE.  He balance is lacking some but has had no falls. She feels her greatest limitation is her bilat ankle tendonitis and OA (as per pt) causing pain with amb and limiting her. She will benefit from skilled aquatic PT intervention to improve lumbar mobility, balance and reduce residual pain with intention to improve ability to rest at night and return to desired level of function. She reports an active lifestyle with her  grandchildren and walking/shopping.  OBJECTIVE IMPAIRMENTS: decreased activity tolerance, decreased balance, decreased endurance, decreased mobility, difficulty walking, decreased ROM, decreased strength, impaired flexibility, postural  dysfunction, and pain.   ACTIVITY LIMITATIONS: carrying, lifting, bending, squatting, sleeping, stairs, transfers, and locomotion level  PARTICIPATION LIMITATIONS: shopping, community activity, and yard work  [facility] POTENTIAL: Good  CLINICAL DECISION MAKING: Evolving/moderate complexity  EVALUATION COMPLEXITY: Moderate   GOALS: Goals reviewed with patient? Yes  SHORT TERM GOALS: Target date: 10/04/24  Pt will tolerate full aquatic sessions consistently without increase in pain and with improving function to demonstrate good toleration and effectiveness of intervention.  Baseline: Goal status: MET- 10/23/24  2.  Pt will tolerate stair climbing using alternating pattern ascending and descending 6 steps without use of handrail Baseline:  Goal status: In PRogress - 10/23/24  3.  Pt will report improved toleration to resting/sleeping in recliner vs bed for improved resting as nerves heal Baseline:  Goal status: MET- 10/23/24   LONG TERM GOALS: Target date: 11/07/24  Pt to improve on ODI by  13 % to demonstrate statistically significant Improvement in function. (MCID 13-15%) Baseline: 19/50=38% Goal status: INITIAL  2.  Pt will improve amb toleration to > 1 hour without limitation of ankle pain by 50% Baseline:  Goal status: INITIAL  3. Pt will improve strength in all areas listed by  5  to demonstrate improved overall physical function Baseline: see chart Goal status: INITIAL  4.  Pt will report reduced frequency of waking at night to <3x Baseline: >6 Goal status: INITIAL  5.  Pt will improve on 5 X STS test to <or= 12s to demonstrate improving functional lower extremity strength, transitional movements, and balance. (MDC = 4.2sec)  Baseline: 17.54 Goal status: INITIAL  PLAN:  PT FREQUENCY: 1-2x/week  PT DURATION: 8 weeks  PLANNED INTERVENTIONS: [id number]- PT Re-evaluation, 97750- Physical Performance Testing, 97110-Therapeutic exercises, 97530- Therapeutic activity,  [id number]- Neuromuscular re-education, 97535- Self Care, [id number]- Manual therapy, [id number]- Gait training, [phone]- Aquatic Therapy, [phone] (1-2 muscles), [id number] (3+ muscles)- Dry Needling, Patient/Family education, Balance training, Stair training, Taping, Joint mobilization, DME instructions, Cryotherapy, and Moist heat.  PLAN FOR NEXT SESSION: aquatics for lumbosacral strengthening,, pain management, stretching,    Delon Aquas, PTA 10/25/2024 12:42 PM [facility] [address] [place], [place], [address] Phone: [phone]   Fax:  [phone]    Referring diagnosis? Low back pain Treatment diagnosis? (if different than referring diagnosis) added ankle pain What was this (referring dx) caused by? [x]  Surgery []  Fall [x]  Ongoing issue []  Arthritis []  Other: ____________  Laterality: []  Rt []  Lt [x]  Both  Check all possible CPT codes:  *CHOOSE 10 OR LESS*    See Planned Interventions listed in the Plan section of the Evaluation.    "

## 2024-10-31 ENCOUNTER — Encounter (HOSPITAL_BASED_OUTPATIENT_CLINIC_OR_DEPARTMENT_OTHER): Payer: Self-pay | Admitting: Physical Therapy

## 2024-10-31 ENCOUNTER — Ambulatory Visit (HOSPITAL_BASED_OUTPATIENT_CLINIC_OR_DEPARTMENT_OTHER): Attending: Orthopedic Surgery | Admitting: Physical Therapy

## 2024-10-31 DIAGNOSIS — M25572 Pain in left ankle and joints of left foot: Secondary | ICD-10-CM | POA: Diagnosis present

## 2024-10-31 DIAGNOSIS — M6281 Muscle weakness (generalized): Secondary | ICD-10-CM | POA: Insufficient documentation

## 2024-10-31 DIAGNOSIS — M25571 Pain in right ankle and joints of right foot: Secondary | ICD-10-CM | POA: Insufficient documentation

## 2024-10-31 DIAGNOSIS — M5459 Other low back pain: Secondary | ICD-10-CM | POA: Diagnosis present

## 2024-10-31 NOTE — Therapy (Signed)
 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    Authorization Time Period 18 visits approved  From 11.20.2025 - 01.16.2026  Authorization #[id number]   [employer] ([employer])    Authorization - Visit Number 6    Authorization - Number of Visits 18    Progress Note Due on Visit 10    PT Start Time 1016    PT Stop Time 1100    PT Time Calculation (min) 44 min    Activity Tolerance Patient tolerated treatment well    Behavior During Therapy WFL for tasks assessed/performed             Past Medical History:  Diagnosis Date   Allergy    seasonal   Arthritis    knee, left hip, ankles    Borderline glaucoma    CAD (coronary artery disease)    Mild nonobstructive CAD by cath 02/06/13 (25% mid LAD), normal EF   Chronic low back pain    Colitis    Coronary arteriosclerosis    DDD (degenerative disc disease), lumbar    Degeneration of intervertebral disc of lumbar region    Family history of adverse reaction to anesthesia    mother has problems with nausea and vomiting    Glaucoma    History of colitis    02/ 2016  acute infectious colitis-- resolved   History of total knee arthroplasty    Hyperglycemia    Hyperlipidemia    Hypertension    Left ovarian cyst    Low back pain    with bilateral hip radiation    Lumbar radiculopathy    Lumbar spondylolysis    Obesity    Osteoporosis    ospenia of hips   Pain in joint of left shoulder    Pain of right hip joint    PONV (postoperative nausea and vomiting)    SEVERE   Past Surgical History:  Procedure Laterality Date   ABDOMINAL EXPOSURE N/A 05/01/2020   Procedure: ABDOMINAL EXPOSURE;  Surgeon: Oris Krystal FALCON, MD;  Location: [facility] OR;  Service: Vascular;  Laterality: N/A;    ANTERIOR LAT LUMBAR FUSION N/A 05/01/2020   Procedure: ANTERIOR LATERAL LUMBAR FUSION L4-S1;  Surgeon: [name], MD;  Location: [facility] OR;  Service: Orthopedics;  Laterality: N/A;  4.5 hrs Dr. Oris to do approach tap block with exparel    ANTERIOR LAT LUMBAR FUSION N/A 04/03/2024   Procedure: EXTREME LATERAL INTERBODY FUSION LUMBAR TWO - THREE TO LUMBAR THREE - FOUR;  Surgeon: [name], MD;  Location: [facility] OR;  Service: Orthopedics;  Laterality: N/A;  XLIF L2-3, L3-4   CARPAL TUNNEL RELEASE Bilateral 1990's   COLONOSCOPY     KNEE ARTHROSCOPY W/ MENISCECTOMY Left 07/2014   LEFT HEART CATHETERIZATION WITH CORONARY ANGIOGRAM N/A 02/06/2013   Procedure: LEFT HEART CATHETERIZATION WITH CORONARY ANGIOGRAM;  Surgeon: Deatrice DELENA Cage, MD;  Location: [facility] CATH LAB;  Service: Cardiovascular;  Laterality: N/A;  non-obstruction 25% mLAD,  normal LVF , ef 65-70%   NASAL SINUS SURGERY     OVARIAN CYST REMOVAL Left    ROTATOR CUFF REPAIR Right 10/2016   TOTAL KNEE ARTHROPLASTY Left 03/26/2015   Procedure: LEFT TOTAL KNEE ARTHROPLASTY;  Surgeon: Donnice Car, MD;  Location:  [facility] ORS;  Service: Orthopedics;  Laterality: Left;   Patient Active Problem List   Diagnosis Date Noted   Pulmonary embolism (HCC) 04/27/2024   Acute pulmonary embolism (HCC) 04/25/2024   Acute deep vein thrombosis (DVT) of left lower extremity (HCC) 04/25/2024   S/P lumbar fusion 05/01/2020   Hypertension 11/20/2019   Family history of early CAD 07/15/2017   Elevated blood pressure reading in office without diagnosis of hypertension 07/15/2017   Obese 03/27/2015   S/P left TKA 03/26/2015   S/P knee replacement 03/26/2015   Colitis, acute 12/01/2014   Weight loss, unintentional 12/01/2014   Adnexal cyst 12/01/2014   Elevated fasting glucose 12/01/2014   CAD (coronary artery disease) 12/01/2014   Osteoarthritis 12/01/2014   Vasovagal attack 02/28/2013   Hyperlipidemia 02/28/2013    PCP: Prentice Blush MD  REFERRING  PROVIDER: Donaciano Sprang MD  REFERRING DIAG: M54.50 (ICD-10-CM) - Low back pain, unspecified   Rationale for Evaluation and Treatment: Rehabilitation  THERAPY DIAG:  Other low back pain  Muscle weakness (generalized)  ONSET DATE: June 2025  SUBJECTIVE:                                                                                                                                                                                           SUBJECTIVE STATEMENT: See vascular surgeon tomorrow.  Still have the blockage, he said last time if blockage then he will have to remove.  Back is doing well, no pain.  Some discomfort last week with house chores and putting up decorations. Left leg pain continues but from blockage not LB   Initial Subjective 3 weeks post surgery bl clots lung and lle.  On bl thinner better. Trying to ride elliptical up towards 40 mins a day at home in 2 sessions.  I do have some residual circulation problems through LLE primarily ankle and anterior calf area.  Prior to surgery I had right sided nerve pain into right knee and some calf now is is better. Only have pain there now after lying down at night after about 10 minutes.  I get up more than 5-6 times nightly due to pain.  No pain during day when standing. I have tendonitis both ankle chronic, limits some of my walking more than my back. Not as steady  PERTINENT HISTORY:  Surgery 04/03/24: 09/05/24 Dr sprang Pt is now 5 months out from a XLIF L2-4 with supplemental pedicle screw fixation L2-S1. Patient has had a prior L4-S1 instrumented fusion and we extended this to L2 .  04/25/2024 she was admitted to the hospital and diagnosed with a PE and lower extremity DVT  L TKR R Knee OA   PAIN:  Are you having pain? Yes: NPRS scale: 5/10 Pain location: lower back   Pain description: sore Aggravating factors: supine/lying down Relieving factors: lying down short while     PRECAUTIONS: Fall   RED FLAGS: None       WEIGHT BEARING RESTRICTIONS: No   FALLS:  Has patient fallen in last 6 months? No   LIVING ENVIRONMENT: Lives with: lives with their spouse Lives in: House/apartment Stairs: 3-4 step going into home no handrail Has following equipment at home: None   OCCUPATION: retired.   PLOF: Independent  PATIENT GOALS: sleeping better, energy, walking further, improve balance  NEXT MD VISIT: 6 months  OBJECTIVE:  Note: Objective measures were completed at Evaluation unless otherwise noted.  DIAGNOSTIC FINDINGS:  NA  PATIENT SURVEYS:  ODI  COGNITION: Overall cognitive status: Within functional limits for tasks assessed     SENSATION: WFL  MUSCLE LENGTH: Hamstrings: wfl   POSTURE: rounded shoulders, forward head, and decreased lumbar lordosis  PALPATION: No TTP  LUMBAR ROM:   AROM eval  Flexion NT  Extension wfl  Right lateral flexion wfl  Left lateral flexion Wfl Mild discomfort  Right rotation   Left rotation    (Blank rows = not tested)  LOWER EXTREMITY ROM:     wfl   LOWER EXTREMITY MMT:    MMT Right eval Left eval  Hip flexion 24.0 26.6  Hip extension    Hip abduction 23.7 25.6  Hip adduction    Hip internal rotation    Hip external rotation    Knee flexion 4 4  Knee extension    Ankle dorsiflexion 4 4  Ankle plantarflexion    Ankle inversion    Ankle eversion     (Blank rows = not tested)  LUMBAR SPECIAL TESTS:  Straight leg raise test: Negative  FUNCTIONAL TESTS:  5 times sit to stand: 17.54 Timed up and go (TUG): 13.57. Pt needs extra time to rise then lower from chair 4 stage balance: passed 1&2.  Tandem unsteady but able to hold 15s.  SLS x 3 s   GAIT: Distance walked: 500 ft Assistive device utilized: None Level of assistance: Complete Independence Comments: wfl  TREATMENT   [facility] Adult PT Treatment:                                                DATE: 10/31/24 Pt seen for aquatic therapy today.  Treatment took place in water  3.5-4.75 ft in depth at the [facility] pool. Temp of water was 91.  Pt entered/exited the pool via stairs using alternating pattern with hand rail.  *unsupported walking forward, backward and side stepping  * suitcase carry using bilat then single yellow hand float marching forward/back *stair climbing alternating pattern ue lightly on hand rail for safety * side stepping with arm add/abdct with yellow hand floats  *forward and backward march with kick *1/2 noodle pull down for TrA engagement wide stance then staggered x 10 *decompression yellow noodle under arms behind back: cycling; hip add/abd then flex/extension  Pt requires the buoyancy and hydrostatic pressure of water for support, and to offload joints by unweighting joint load by at least 50 % in navel deep water and by at least 75-80% in chest to neck deep water.  Viscosity of the water is  needed for resistance of strengthening. Water current perturbations provides challenge to standing balance requiring increased core activation.   [facility] Adult PT Treatment:                                                DATE: 10/25/24  Therapeutic Exercise: Elliptical L1 x 5 min  Review of current exercises she completes (SLR - encouraged to bend opposite knee; single knee to chest- encouraged to bend opposite knee; hip abdct in sidelying) Quadruped opposite arm/ leg lifts x 5 each side (some pain in Lt knee with kneeling) Standing bil shoulder ext with red band in staggered stance with TrA set x 10 each LE forward Standing bil low row with green band in staggered stance with TrA set x 10 each LE forward Bow and arrow with step back with TrA set x 10 each LE forward, green band Hip abdct in sidelying with small pulses x 12 each LE, core engaged Bridge with 5 sec hold x 10 Hook lying piriformis stretch 20 sec                                                                                                                                    PATIENT EDUCATION:  Education details: reacquainting to aquatic therapy Person educated: Patient Education method: Explanation Education comprehension: verbalized understanding  HOME EXERCISE PROGRAM: Issued last episode: AQUATIC Access Code: [email or web address] URL: [email or web address] Date: 02/14/2024  [place] Access Code: [email or web address] URL: [email or web address] Date: 10/25/2024  - issued with red and green band   ASSESSMENT:  CLINICAL IMPRESSION: Pt has made good progress with therapy reporting good response from aquatic sessions with very little to no LBP.  Shre complete stair climbing using alternating pattern without difficulty.  Does use slight rue on hand rail for safety meeting goal. 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 The hydrostatic pressure assists the venous return while submerged improving symptoms experienced in LLE throughout session. Plan going forward will be determined by MD appt tomorrow.  Discussed progressing to land based intervention for a few visits, progressing out of aquatics. Re-cert to be completed 1/13. Goals ongoing   Initial Impression Patient is a 74 y.o. f who was seen today for physical therapy evaluation and treatment for LBP.  She is well know to this clinic as she had an episode of aquatic intervention prior to most recent LB surgery in early June.  She presents today with improved pain sensitivity  in LB and rle since surgical procedure (XLIF L2-4). Pain is reported at night with supine or side lying and completely resolved with standing/moving. She is limited in forward lumbar flex and has some strength deficits in LE.  He balance is lacking some but has had  no falls. She feels her greatest limitation is her bilat ankle tendonitis and OA (as per pt) causing pain with amb and limiting her. She will benefit from skilled aquatic PT intervention to  improve lumbar mobility, balance and reduce residual pain with intention to improve ability to rest at night and return to desired level of function. She reports an active lifestyle with her grandchildren and walking/shopping.  OBJECTIVE IMPAIRMENTS: decreased activity tolerance, decreased balance, decreased endurance, decreased mobility, difficulty walking, decreased ROM, decreased strength, impaired flexibility, postural dysfunction, and pain.   ACTIVITY LIMITATIONS: carrying, lifting, bending, squatting, sleeping, stairs, transfers, and locomotion level  PARTICIPATION LIMITATIONS: shopping, community activity, and yard work  [facility] POTENTIAL: Good  CLINICAL DECISION MAKING: Evolving/moderate complexity  EVALUATION COMPLEXITY: Moderate   GOALS: Goals reviewed with patient? Yes  SHORT TERM GOALS: Target date: 10/04/24  Pt will tolerate full aquatic sessions consistently without increase in pain and with improving function to demonstrate good toleration and effectiveness of intervention.  Baseline: Goal status: MET- 10/23/24  2.  Pt will tolerate stair climbing using alternating pattern ascending and descending 6 steps without use of handrail Baseline:  Goal status: In PRogress - 10/23/24; Met1/6/26  3.  Pt will report improved toleration to resting/sleeping in recliner vs bed for improved resting as nerves heal Baseline:  Goal status: MET- 10/23/24   LONG TERM GOALS: Target date: 11/07/24  Pt to improve on ODI by  13 % to demonstrate statistically significant Improvement in function. (MCID 13-15%) Baseline: 19/50=38% Goal status: INITIAL  2.  Pt will improve amb toleration to > 1 hour without limitation of ankle pain by 50% Baseline:  Goal status: INITIAL  3. Pt will improve strength in all areas listed by  5  to demonstrate improved overall physical function Baseline: see chart Goal status: INITIAL  4.  Pt will report reduced frequency of waking at night to  <3x Baseline: >6 Goal status: INITIAL  5.  Pt will improve on 5 X STS test to <or= 12s to demonstrate improving functional lower extremity strength, transitional movements, and balance. (MDC = 4.2sec)  Baseline: 17.54 Goal status: INITIAL  PLAN:  PT FREQUENCY: 1-2x/week  PT DURATION: 8 weeks  PLANNED INTERVENTIONS: [id number]- PT Re-evaluation, 97750- Physical Performance Testing, 97110-Therapeutic exercises, 97530- Therapeutic activity, [id number]- Neuromuscular re-education, 97535- Self Care, [id number]- Manual therapy, [id number]- Gait training, [phone]- Aquatic Therapy, [phone] (1-2 muscles), [id number] (3+ muscles)- Dry Needling, Patient/Family education, Balance training, Stair training, Taping, Joint mobilization, DME instructions, Cryotherapy, and Moist heat.  PLAN FOR NEXT SESSION: aquatics for lumbosacral strengthening,, pain management, stretching,    Ronal Foots) Govanni Plemons MPT 10/31/2024 11:04 AM [facility] [address] [place], [place], [address] Phone: [phone]   Fax:  [phone]     Referring diagnosis? Low back pain Treatment diagnosis? (if different than referring diagnosis) added ankle pain What was this (referring dx) caused by? [x]  Surgery []  Fall [x]  Ongoing issue []  Arthritis []  Other: ____________  Laterality: []  Rt []  Lt [x]  Both  Check all possible CPT codes:  *CHOOSE 10 OR LESS*    See Planned Interventions listed in the Plan section of the Evaluation.    "

## 2024-11-01 ENCOUNTER — Encounter: Payer: Self-pay | Admitting: Vascular Surgery

## 2024-11-01 ENCOUNTER — Ambulatory Visit: Attending: Vascular Surgery | Admitting: Vascular Surgery

## 2024-11-01 VITALS — BP 145/82 | HR 65 | Temp 97.9°F | Ht 65.0 in | Wt 189.0 lb

## 2024-11-01 DIAGNOSIS — I871 Compression of vein: Secondary | ICD-10-CM | POA: Diagnosis not present

## 2024-11-01 DIAGNOSIS — I87002 Postthrombotic syndrome without complications of left lower extremity: Secondary | ICD-10-CM | POA: Diagnosis not present

## 2024-11-01 NOTE — Progress Notes (Signed)
 "  Patient ID: Carol Cox, female   DOB: 1951-07-15, 74 y.o.   MRN: [id number]  Reason for Consult: Follow-up   Referred by Tanda Prentice DEL, MD  Subjective:     HPI:  Carol Cox is a 74 y.o. 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  She has now completed Eliquis  does not take any anticoagulant or antiplatelet medications at this time.  She does not have a family history of DVT.  Swelling continues she is wearing compression stockings today states that her leg is quite uncomfortable.  She is able to walk but does have pain particularly towards the end of the day in the leg.  She states that the skin is healthy without ulceration.  Past Medical History:  Diagnosis Date   Allergy    seasonal   Arthritis    knee, left hip, ankles    Borderline glaucoma    CAD (coronary artery disease)    Mild nonobstructive CAD by cath 02/06/13 (25% mid LAD), normal EF   Chronic low back pain    Colitis    Coronary arteriosclerosis    DDD (degenerative disc disease), lumbar    Degeneration of intervertebral disc of lumbar region    Family history of adverse reaction to anesthesia    mother has problems with nausea and vomiting    Glaucoma    History of colitis    02/ 2016  acute infectious colitis-- resolved   History of total knee arthroplasty    Hyperglycemia    Hyperlipidemia    Hypertension    Left ovarian cyst    Low back pain    with bilateral hip radiation    Lumbar radiculopathy    Lumbar spondylolysis    Obesity    Osteoporosis    ospenia of hips   Pain in joint of left shoulder    Pain of right hip joint    PONV (postoperative nausea and vomiting)    SEVERE   Family History  Problem Relation Age of Onset   CVA Mother    Sudden death Father 18       Presumed heart attack   Heart  attack Father    Heart attack Sister 47   CAD Sister    CAD Maternal Uncle 19   CAD Maternal Uncle 50   Heart attack Maternal Uncle 42   Heart attack Maternal Uncle 50   Colon cancer Neg Hx    Colon polyps Neg Hx    Esophageal cancer Neg Hx    Rectal cancer Neg Hx    Stomach cancer Neg Hx    Past Surgical History:  Procedure Laterality Date   ABDOMINAL EXPOSURE N/A 05/01/2020   Procedure: ABDOMINAL EXPOSURE;  Surgeon: Oris Krystal FALCON, MD;  Location: [facility] OR;  Service: Vascular;  Laterality: N/A;   ANTERIOR LAT LUMBAR FUSION N/A 05/01/2020   Procedure: ANTERIOR LATERAL LUMBAR FUSION L4-S1;  Surgeon: [name], MD;  Location: [facility] OR;  Service: Orthopedics;  Laterality: N/A;  4.5 hrs Dr. Oris to do approach tap block with exparel    ANTERIOR LAT LUMBAR FUSION N/A 04/03/2024   Procedure: EXTREME LATERAL INTERBODY FUSION LUMBAR TWO - THREE TO LUMBAR THREE - FOUR;  Surgeon: [name], MD;  Location: [facility] OR;  Service: Orthopedics;  Laterality: N/A;  XLIF L2-3, L3-4   CARPAL  TUNNEL RELEASE Bilateral 1990's   COLONOSCOPY     KNEE ARTHROSCOPY W/ MENISCECTOMY Left 07/2014   LEFT HEART CATHETERIZATION WITH CORONARY ANGIOGRAM N/A 02/06/2013   Procedure: LEFT HEART CATHETERIZATION WITH CORONARY ANGIOGRAM;  Surgeon: Deatrice DELENA Cage, MD;  Location: [facility] CATH [facility];  Service: Cardiovascular;  Laterality: N/A;  non-obstruction 25% mLAD,  normal LVF , ef 65-70%   NASAL SINUS SURGERY     OVARIAN CYST REMOVAL Left    ROTATOR CUFF REPAIR Right 10/2016   TOTAL KNEE ARTHROPLASTY Left 03/26/2015   Procedure: LEFT TOTAL KNEE ARTHROPLASTY;  Surgeon: Donnice Car, MD;  Location: [facility] ORS;  Service: Orthopedics;  Laterality: Left;    Short Social History:  Social History   Tobacco Use   Smoking status: Never   Smokeless tobacco: Never  Substance Use Topics   Alcohol use: No    Allergies[1]  Current Outpatient Medications  Medication Sig Dispense Refill   acetaminophen  (TYLENOL ) 500 MG tablet Take  500 mg by mouth every 6 (six) hours as needed for mild pain (pain score 1-3).     apixaban  (ELIQUIS ) 5 MG TABS tablet Take 2 tablets (10 mg total) by mouth 2 (two) times daily for 7 days, THEN 1 tablet (5 mg total) 2 (two) times daily. 388 tablet 0   Calcium  200 MG TABS Take 200 mg by mouth daily.     Cholecalciferol (VITAMIN D3) 50 MCG (2000 UT) capsule Take 2,000 Units by mouth daily.     diazepam  (VALIUM ) 10 MG tablet Take 5 mg by mouth every 8 (eight) hours as needed for anxiety or sleep.     dorzolamide -timolol  (COSOPT ) 22.3-6.8 MG/ML ophthalmic solution Place 1 drop into both eyes in the morning and at bedtime.     Evolocumab  (REPATHA  SURECLICK) 140 MG/ML SOAJ INJECT 1 PEN INTO THE SKIN EVERY 14 (FOURTEEN) DAYS. 6 mL 1   ezetimibe  (ZETIA ) 10 MG tablet Take 1 tablet (10 mg total) by mouth daily. 90 tablet 3   methocarbamol  (ROBAXIN ) 500 MG tablet Take 500 mg by mouth every 8 (eight) hours as needed for muscle spasms. For 14 days.     Omega-3 Fatty Acids (FISH OIL) 1000 MG CAPS Take 1 capsule by mouth daily.     PERCOCET 10-325 MG tablet Take 1 tablet by mouth every 8 (eight) hours as needed for pain.     psyllium (METAMUCIL) 58.6 % powder Take 1 packet by mouth as needed (constipation).     Travoprost, BAK Free, (TRAVATAN) 0.004 % SOLN ophthalmic solution Place 1 drop into both eyes at bedtime.      No current facility-administered medications for this visit.    Review of Systems  Constitutional:  Constitutional negative. HENT: HENT negative.  Eyes: Eyes negative.  Cardiovascular: Positive for leg swelling.  GI: Gastrointestinal negative.  Musculoskeletal: Positive for gait problem and leg pain.  Skin: Skin negative.  Hematologic: Hematologic/lymphatic negative.  Psychiatric: Psychiatric negative.        Objective:  Objective   Vitals:   11/01/24 1430  BP: (!) 145/82  Pulse: 65  Temp: 97.9 F (36.6 C)  SpO2: 95%  Weight: 189 lb (85.7 kg)  Height: 5' 5 (1.651 m)   Body  mass index is 31.45 kg/m.  Physical Exam HENT:     Head: Normocephalic.     Nose: Nose normal.  Eyes:     Pupils: Pupils are equal, round, and reactive to light.  Cardiovascular:     Rate and Rhythm: Normal rate.  Pulses: Normal pulses.  Pulmonary:     Effort: Pulmonary effort is normal.  Abdominal:     General: Abdomen is flat.  Musculoskeletal:     Right lower leg: No edema.     Left lower leg: Edema present.     Comments: Compression stocking in place left lower extremity  Skin:    General: Skin is warm.  Neurological:     General: No focal deficit present.     Mental Status: She is alert.  Psychiatric:        Mood and Affect: Mood normal.     Data: CT VENOGRAM ABDOMEN AND PELVIS AND LOWER EXTREMITY BILATERAL   TECHNIQUE: Venographic phase images of the abdomen, pelvis and lower extremities were obtained following the administration of intravenous contrast. Multiplanar reformats and maximum intensity projections were generated.   RADIATION DOSE REDUCTION: This exam was performed according to the departmental dose-optimization program which includes automated exposure control, adjustment of the mA and/or kV according to patient size and/or use of iterative reconstruction technique.   CONTRAST:  OMNIPAQUE  IOHEXOL  350 MG/ML SOLN   COMPARISON:  Prior CT scan of the abdomen and pelvis 12/01/2014   FINDINGS: Lower chest: No acute abnormality.   Hepatobiliary: No focal liver abnormality is seen. No gallstones, gallbladder wall thickening, or biliary dilatation.   Pancreas: Unremarkable. No pancreatic ductal dilatation or surrounding inflammatory changes.   Spleen: Normal in size without focal abnormality.   Adrenals/Urinary Tract: Normal adrenal glands. No hydronephrosis, nephrolithiasis or enhancing renal mass. Large simple cyst exophytic from the inferolateral aspect of the left kidney measures up to 6.7 cm. No imaging follow-up is recommended. The  ureters and bladder are unremarkable.   Stomach/Bowel: No focal bowel wall thickening or evidence of obstruction.   Vascular/Lymphatic: Scattered atherosclerotic calcifications. No evidence of aneurysm or dissection.   Reproductive: Uterus and bilateral adnexa are unremarkable.   Other: No abdominal wall hernia or abnormality. No abdominopelvic ascites.   Musculoskeletal: No acute fracture or aggressive appearing lytic or blastic osseous lesion.   IVC: No evidence for thrombus or stenosis.   Portal and mesenteric veins: No evidence for thrombus or stenosis.   Bilateral iliac veins: The right iliac vein is widely patent and unremarkable. There is definitive compressive physiology affecting the left common iliac vein as it passes between the left common iliac artery and the spine. Further, the left common iliac vein is diffusely thinned and fibrotic and likely chronically occluded. The vessel then regains normal patency at the common femoral vein.   Right lower extremity: No evidence for thrombus involving the common femoral, femoral, popliteal and visualized deep calf veins   Left lower extremity: No evidence for thrombus involving the common femoral, femoral, popliteal and visualized deep calf veins.   IMPRESSION: 1. 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 2. No evidence of acute DVT at this time. 3. Prominent bilateral great saphenous veins may reflect underlying superficial venous insufficiency. 4. Unremarkable right iliac venous system and inferior vena cava. 5. Scattered aortic atherosclerotic calcifications without aneurysm or dissection. 6. Additional ancillary findings as above.     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  We discussed her options which would include continued compression stockings with or without resuming  anticoagulation.  Either way we have discussed initiating 81 mg aspirin  daily.  Alternatively we could proceed with venogram however given the sclerotic external iliac vein may not be possible to place stents and even if we could perform stenting of the left external and common iliac veins she may not have a durable result given the CT findings.  She could also consider Jacklyn procedure using the right greater saphenous vein however I would reserve this for failed endovascular intervention or attempt.  This time patient is going to discuss with her family how to proceed.  She can call to schedule left lower extremity ascending venography from a popliteal approach and would likely require overnight observation.  I discussed that if we placed a stent given the high risk nature of the stent would likely have her on Lovenox  for 3 to 4 weeks and transition back to Eliquis  for some timeframe to prevent thrombosis.  She demonstrates good understanding of this and will start aspirin  81 mg daily and call to schedule if she wants to proceed.     Penne Lonni Colorado MD [facility]       [1]  Allergies Allergen Reactions   Atorvastatin Other (See Comments)    Leg pain   Rosuvastatin  Other (See Comments)    Leg pain   "

## 2024-11-02 ENCOUNTER — Encounter (HOSPITAL_BASED_OUTPATIENT_CLINIC_OR_DEPARTMENT_OTHER): Payer: Self-pay | Admitting: Physical Therapy

## 2024-11-02 ENCOUNTER — Ambulatory Visit (HOSPITAL_BASED_OUTPATIENT_CLINIC_OR_DEPARTMENT_OTHER): Admitting: Physical Therapy

## 2024-11-02 DIAGNOSIS — M6281 Muscle weakness (generalized): Secondary | ICD-10-CM

## 2024-11-02 DIAGNOSIS — M25571 Pain in right ankle and joints of right foot: Secondary | ICD-10-CM

## 2024-11-02 DIAGNOSIS — M5459 Other low back pain: Secondary | ICD-10-CM | POA: Diagnosis not present

## 2024-11-02 NOTE — Therapy (Signed)
 " OUTPATIENT PHYSICAL THERAPY THORACOLUMBAR TREATMENT   Patient Name: Carol Cox MRN: [id number] DOB:October 29, 1950, 74 y.o., female Today's Date: 11/02/2024  END OF SESSION:  PT End of Session - 11/02/24 1107     Visit Number 7    Date for Recertification  11/10/24    Authorization Type humana mcr    Authorization Time Period 18 visits approved  From 11.20.2025 - 01.16.2026  Authorization #[id number]   [employer] ([employer])    Authorization - Visit Number 7    Authorization - Number of Visits 18    Progress Note Due on Visit 10    PT Start Time 1016    PT Stop Time 1100    PT Time Calculation (min) 44 min    Activity Tolerance Patient tolerated treatment well    Behavior During Therapy WFL for tasks assessed/performed              Past Medical History:  Diagnosis Date   Allergy    seasonal   Arthritis    knee, left hip, ankles    Borderline glaucoma    CAD (coronary artery disease)    Mild nonobstructive CAD by cath 02/06/13 (25% mid LAD), normal EF   Chronic low back pain    Colitis    Coronary arteriosclerosis    DDD (degenerative disc disease), lumbar    Degeneration of intervertebral disc of lumbar region    Family history of adverse reaction to anesthesia    mother has problems with nausea and vomiting    Glaucoma    History of colitis    02/ 2016  acute infectious colitis-- resolved   History of total knee arthroplasty    Hyperglycemia    Hyperlipidemia    Hypertension    Left ovarian cyst    Low back pain    with bilateral hip radiation    Lumbar radiculopathy    Lumbar spondylolysis    Obesity    Osteoporosis    ospenia of hips   Pain in joint of left shoulder    Pain of right hip joint    PONV (postoperative nausea and vomiting)    SEVERE   Past Surgical History:  Procedure Laterality Date   ABDOMINAL EXPOSURE N/A 05/01/2020   Procedure: ABDOMINAL EXPOSURE;  Surgeon: Oris Krystal FALCON, MD;  Location: [facility] OR;  Service: Vascular;  Laterality: N/A;    ANTERIOR LAT LUMBAR FUSION N/A 05/01/2020   Procedure: ANTERIOR LATERAL LUMBAR FUSION L4-S1;  Surgeon: [name], MD;  Location: [facility] OR;  Service: Orthopedics;  Laterality: N/A;  4.5 hrs Dr. Oris to do approach tap block with exparel    ANTERIOR LAT LUMBAR FUSION N/A 04/03/2024   Procedure: EXTREME LATERAL INTERBODY FUSION LUMBAR TWO - THREE TO LUMBAR THREE - FOUR;  Surgeon: [name], MD;  Location: [facility] OR;  Service: Orthopedics;  Laterality: N/A;  XLIF L2-3, L3-4   CARPAL TUNNEL RELEASE Bilateral 1990's   COLONOSCOPY     KNEE ARTHROSCOPY W/ MENISCECTOMY Left 07/2014   LEFT HEART CATHETERIZATION WITH CORONARY ANGIOGRAM N/A 02/06/2013   Procedure: LEFT HEART CATHETERIZATION WITH CORONARY ANGIOGRAM;  Surgeon: Deatrice DELENA Cage, MD;  Location: [facility] CATH LAB;  Service: Cardiovascular;  Laterality: N/A;  non-obstruction 25% mLAD,  normal LVF , ef 65-70%   NASAL SINUS SURGERY     OVARIAN CYST REMOVAL Left    ROTATOR CUFF REPAIR Right 10/2016   TOTAL KNEE ARTHROPLASTY Left 03/26/2015   Procedure: LEFT TOTAL KNEE ARTHROPLASTY;  Surgeon: Donnice Car, MD;  Location: [facility] ORS;  Service: Orthopedics;  Laterality: Left;   Patient Active Problem List   Diagnosis Date Noted   Pulmonary embolism (HCC) 04/27/2024   Acute pulmonary embolism (HCC) 04/25/2024   Acute deep vein thrombosis (DVT) of left lower extremity (HCC) 04/25/2024   S/P lumbar fusion 05/01/2020   Hypertension 11/20/2019   Family history of early CAD 07/15/2017   Elevated blood pressure reading in office without diagnosis of hypertension 07/15/2017   Obese 03/27/2015   S/P left TKA 03/26/2015   S/P knee replacement 03/26/2015   Colitis, acute 12/01/2014   Weight loss, unintentional 12/01/2014   Adnexal cyst 12/01/2014   Elevated fasting glucose 12/01/2014   CAD (coronary artery disease) 12/01/2014   Osteoarthritis 12/01/2014   Vasovagal attack 02/28/2013   Hyperlipidemia 02/28/2013    PCP: Prentice Blush MD  REFERRING  PROVIDER: Donaciano Sprang MD  REFERRING DIAG: M54.50 (ICD-10-CM) - Low back pain, unspecified   Rationale for Evaluation and Treatment: Rehabilitation  THERAPY DIAG:  Other low back pain  Muscle weakness (generalized)  Bilateral ankle pain, unspecified chronicity  ONSET DATE: June 2025  SUBJECTIVE:                                                                                                                                                                                           SUBJECTIVE STATEMENT: Saw Vascular MD.  He is not sure if a stent will help but her is willing to try. He said aquatic therapy is the best thing for my leg. Back is feeling pretty good     Initial Subjective 3 weeks post surgery bl clots lung and lle.  On bl thinner better. Trying to ride elliptical up towards 40 mins a day at home in 2 sessions.  I do have some residual circulation problems through LLE primarily ankle and anterior calf area.  Prior to surgery I had right sided nerve pain into right knee and some calf now is is better. Only have pain there now after lying down at night after about 10 minutes.  I get up more than 5-6 times nightly due to pain.  No pain during day when standing. I have tendonitis both ankle chronic, limits some of my walking more than my back. Not as steady  PERTINENT HISTORY:  Surgery 04/03/24: 09/05/24 Dr sprang Pt is now 5 months out from a XLIF L2-4 with supplemental pedicle screw fixation L2-S1. Patient has had a prior L4-S1 instrumented fusion and we extended this to L2 .  04/25/2024 she was admitted to the hospital and diagnosed with a PE and lower extremity DVT   L TKR R Knee  OA   PAIN:  Are you having pain? Yes: NPRS scale: 5/10 Pain location: lower back   Pain description: sore Aggravating factors: supine/lying down Relieving factors: lying down short while     PRECAUTIONS: Fall   RED FLAGS: None      WEIGHT BEARING RESTRICTIONS: No   FALLS:  Has  patient fallen in last 6 months? No   LIVING ENVIRONMENT: Lives with: lives with their spouse Lives in: House/apartment Stairs: 3-4 step going into home no handrail Has following equipment at home: None   OCCUPATION: retired.   PLOF: Independent  PATIENT GOALS: sleeping better, energy, walking further, improve balance  NEXT MD VISIT: 6 months  OBJECTIVE:  Note: Objective measures were completed at Evaluation unless otherwise noted.  DIAGNOSTIC FINDINGS:  NA  PATIENT SURVEYS:  ODI  COGNITION: Overall cognitive status: Within functional limits for tasks assessed     SENSATION: WFL  MUSCLE LENGTH: Hamstrings: wfl   POSTURE: rounded shoulders, forward head, and decreased lumbar lordosis  PALPATION: No TTP  LUMBAR ROM:   AROM eval  Flexion NT  Extension wfl  Right lateral flexion wfl  Left lateral flexion Wfl Mild discomfort  Right rotation   Left rotation    (Blank rows = not tested)  LOWER EXTREMITY ROM:     wfl   LOWER EXTREMITY MMT:    MMT Right eval Left eval  Hip flexion 24.0 26.6  Hip extension    Hip abduction 23.7 25.6  Hip adduction    Hip internal rotation    Hip external rotation    Knee flexion 4 4  Knee extension    Ankle dorsiflexion 4 4  Ankle plantarflexion    Ankle inversion    Ankle eversion     (Blank rows = not tested)  LUMBAR SPECIAL TESTS:  Straight leg raise test: Negative  FUNCTIONAL TESTS:  5 times sit to stand: 17.54 Timed up and go (TUG): 13.57. Pt needs extra time to rise then lower from chair 4 stage balance: passed 1&2.  Tandem unsteady but able to hold 15s.  SLS x 3 s   GAIT: Distance walked: 500 ft Assistive device utilized: None Level of assistance: Complete Independence Comments: wfl  TREATMENT   [facility] Adult PT Treatment:                                                DATE: 11/02/24 Pt seen for aquatic therapy today.  Treatment took place in water 3.5-4.75 ft in depth at the [employer]  pool. Temp of water was 91.  Pt entered/exited the pool via stairs using alternating pattern with hand rail.  *unsupported walking forward, backward and side stepping  * suitcase carry using bilat then single yellow hand float marching forward/back * side stepping with arm add/abdct with yellow hand floats  *forward and backward march with kick *decompression yellow noodle under arms behind back: cycling; hip add/abd then flex/extension  Pt requires the buoyancy and hydrostatic pressure of water for support, and to offload joints by unweighting joint load by at least 50 % in navel deep water and by at least 75-80% in chest to neck deep water.  Viscosity of the water is needed for resistance of strengthening. Water current perturbations provides challenge to standing balance requiring increased core activation.   [facility] Adult PT Treatment:  DATE: 10/25/24  Therapeutic Exercise: Elliptical L1 x 5 min  Review of current exercises she completes (SLR - encouraged to bend opposite knee; single knee to chest- encouraged to bend opposite knee; hip abdct in sidelying) Quadruped opposite arm/ leg lifts x 5 each side (some pain in Lt knee with kneeling) Standing bil shoulder ext with red band in staggered stance with TrA set x 10 each LE forward Standing bil low row with green band in staggered stance with TrA set x 10 each LE forward Bow and arrow with step back with TrA set x 10 each LE forward, green band Hip abdct in sidelying with small pulses x 12 each LE, core engaged Bridge with 5 sec hold x 10 Hook lying piriformis stretch 20 sec                                                                                                                                   PATIENT EDUCATION:  Education details: reacquainting to aquatic therapy Person educated: Patient Education method: Explanation Education comprehension: verbalized understanding  HOME  EXERCISE PROGRAM: Issued last episode: AQUATIC Access Code: [email or web address] URL: [email or web address] Date: 02/14/2024  Land Access Code: [email or web address] URL: [email or web address] Date: 10/25/2024  - issued with red and green band   ASSESSMENT:  CLINICAL IMPRESSION: Pt deciding on if she will have vascular surgery. If so it will be end of month.  Pt reports good response from last session.  LBP minimal.  Majority of session focused on increasing blood flow return using hydrostatic pressure and le/core movements/exercise.  Good toleration without increase in pain.  Goals ongoing  Pt has made good progress with therapy reporting good response from aquatic sessions with very little to no LBP.  Shre complete stair climbing using alternating pattern without difficulty.  Does use slight rue on hand rail for safety meeting goal. 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 The hydrostatic pressure assists the venous return while submerged improving symptoms experienced in LLE throughout session. Plan going forward will be determined by MD appt tomorrow.  Discussed progressing to land based intervention for a few visits, progressing out of aquatics. Re-cert to be completed 1/13. Goals ongoing   Initial Impression Patient is a 74 y.o. f who was seen today for physical therapy evaluation and treatment for LBP.  She is well know to this clinic as she had an episode of aquatic intervention prior to most recent LB surgery in early June.  She presents today with improved pain sensitivity  in LB and rle since surgical procedure (XLIF L2-4). Pain is reported at night with supine or side lying and completely resolved with standing/moving. She is limited in forward lumbar flex and has some strength deficits in LE.  He balance is lacking some but has had no falls. She feels her greatest limitation is her bilat ankle  tendonitis and OA (  as per pt) causing pain with amb and limiting her. She will benefit from skilled aquatic PT intervention to improve lumbar mobility, balance and reduce residual pain with intention to improve ability to rest at night and return to desired level of function. She reports an active lifestyle with her grandchildren and walking/shopping.  OBJECTIVE IMPAIRMENTS: decreased activity tolerance, decreased balance, decreased endurance, decreased mobility, difficulty walking, decreased ROM, decreased strength, impaired flexibility, postural dysfunction, and pain.   ACTIVITY LIMITATIONS: carrying, lifting, bending, squatting, sleeping, stairs, transfers, and locomotion level  PARTICIPATION LIMITATIONS: shopping, community activity, and yard work  [facility] POTENTIAL: Good  CLINICAL DECISION MAKING: Evolving/moderate complexity  EVALUATION COMPLEXITY: Moderate   GOALS: Goals reviewed with patient? Yes  SHORT TERM GOALS: Target date: 10/04/24  Pt will tolerate full aquatic sessions consistently without increase in pain and with improving function to demonstrate good toleration and effectiveness of intervention.  Baseline: Goal status: MET- 10/23/24  2.  Pt will tolerate stair climbing using alternating pattern ascending and descending 6 steps without use of handrail Baseline:  Goal status: In PRogress - 10/23/24; Met1/6/26  3.  Pt will report improved toleration to resting/sleeping in recliner vs bed for improved resting as nerves heal Baseline:  Goal status: MET- 10/23/24   LONG TERM GOALS: Target date: 11/07/24  Pt to improve on ODI by 13% to demonstrate statistically significant Improvement in function. (MCID 13-15%) Baseline: 19/50=38% Goal status: INITIAL  2.  Pt will improve amb toleration to > 1 hour without limitation of ankle pain by 50% Baseline:  Goal status: INITIAL  3. Pt will improve strength in all areas listed by  5  to demonstrate improved overall physical  function Baseline: see chart Goal status: INITIAL  4.  Pt will report reduced frequency of waking at night to <3x Baseline: >6 Goal status: INITIAL  5.  Pt will improve on 5 X STS test to <or= 12s to demonstrate improving functional lower extremity strength, transitional movements, and balance. (MDC = 4.2sec)  Baseline: 17.54 Goal status: INITIAL  PLAN:  PT FREQUENCY: 1-2x/week  PT DURATION: 8 weeks  PLANNED INTERVENTIONS: [id number]- PT Re-evaluation, 97750- Physical Performance Testing, 97110-Therapeutic exercises, 97530- Therapeutic activity, [id number]- Neuromuscular re-education, 97535- Self Care, [id number]- Manual therapy, [id number]- Gait training, [phone]- Aquatic Therapy, [phone] (1-2 muscles), [id number] (3+ muscles)- Dry Needling, Patient/Family education, Balance training, Stair training, Taping, Joint mobilization, DME instructions, Cryotherapy, and Moist heat.  PLAN FOR NEXT SESSION: aquatics for lumbosacral strengthening,, pain management, stretching,    Ronal Foots) Hiroko Tregre MPT 11/02/2024 11:08 AM [facility] [address] [place], [place], [address] Phone: [phone]   Fax:  [phone]     Referring diagnosis? Low back pain Treatment diagnosis? (if different than referring diagnosis) added ankle pain What was this (referring dx) caused by? [x]  Surgery []  Fall [x]  Ongoing issue []  Arthritis []  Other: ____________  Laterality: []  Rt []  Lt [x]  Both  Check all possible CPT codes:  *CHOOSE 10 OR LESS*    See Planned Interventions listed in the Plan section of the Evaluation.    "

## 2024-11-03 ENCOUNTER — Telehealth: Payer: Self-pay | Admitting: Internal Medicine

## 2024-11-03 NOTE — Telephone Encounter (Signed)
 Pt states had recent procedure and was taken off of lisinopril . Pt would like to know if she can start back taking it, please advise.

## 2024-11-03 NOTE — Telephone Encounter (Signed)
 Called pt advised of MD recommendation.  Pt reports will check BP once daily for a week or 2 and send in log through My Chart.

## 2024-11-03 NOTE — Telephone Encounter (Signed)
 Spoke with pt. Pt is wanting to restart lisinopril . Pt went to Dr for test results on wednesday BP 145/82. Pt does not have any other BP readings. Pt denies any symptoms and is feeling fine. Told pt I would send her request over to Dr Santo to review but that it would be a good idea to keep a BP log for the next week for him to see a more accurate BP range. Pt stated understanding and thanks.

## 2024-11-07 ENCOUNTER — Ambulatory Visit (HOSPITAL_BASED_OUTPATIENT_CLINIC_OR_DEPARTMENT_OTHER): Admitting: Physical Therapy

## 2024-11-07 ENCOUNTER — Encounter (HOSPITAL_BASED_OUTPATIENT_CLINIC_OR_DEPARTMENT_OTHER): Payer: Self-pay | Admitting: Physical Therapy

## 2024-11-07 DIAGNOSIS — M5459 Other low back pain: Secondary | ICD-10-CM | POA: Diagnosis not present

## 2024-11-07 DIAGNOSIS — M6281 Muscle weakness (generalized): Secondary | ICD-10-CM

## 2024-11-07 DIAGNOSIS — M25571 Pain in right ankle and joints of right foot: Secondary | ICD-10-CM

## 2024-11-07 NOTE — Therapy (Signed)
 " OUTPATIENT PHYSICAL THERAPY THORACOLUMBAR TREATMENT Progress Note / Re-Cert Reporting Period 09/14/24 to 11/07/24  See note below for Objective Data and Assessment of Progress/Goals.      Patient Name: Carol Cox MRN: [id number] DOB:13-Mar-1951, 74 y.o., female Today's Date: 11/07/2024  END OF SESSION:  PT End of Session - 11/07/24 1008     Visit Number 8    Date for Recertification  01/05/25    Authorization Type humana mcr    Authorization Time Period 18 visits approved  From 11.20.2025 - 01.16.2026  Authorization #[id number]   [employer] ([employer])    Authorization - Visit Number 8    Authorization - Number of Visits 18    Progress Note Due on Visit 18    PT Start Time 1015    PT Stop Time 1055    PT Time Calculation (min) 40 min    Activity Tolerance Patient tolerated treatment well    Behavior During Therapy WFL for tasks assessed/performed              Past Medical History:  Diagnosis Date   Allergy    seasonal   Arthritis    knee, left hip, ankles    Borderline glaucoma    CAD (coronary artery disease)    Mild nonobstructive CAD by cath 02/06/13 (25% mid LAD), normal EF   Chronic low back pain    Colitis    Coronary arteriosclerosis    DDD (degenerative disc disease), lumbar    Degeneration of intervertebral disc of lumbar region    Family history of adverse reaction to anesthesia    mother has problems with nausea and vomiting    Glaucoma    History of colitis    02/ 2016  acute infectious colitis-- resolved   History of total knee arthroplasty    Hyperglycemia    Hyperlipidemia    Hypertension    Left ovarian cyst    Low back pain    with bilateral hip radiation    Lumbar radiculopathy    Lumbar spondylolysis    Obesity    Osteoporosis    ospenia of hips   Pain in joint of left shoulder    Pain of right hip joint    PONV (postoperative nausea and vomiting)    SEVERE   Past Surgical History:  Procedure Laterality Date   ABDOMINAL EXPOSURE  N/A 05/01/2020   Procedure: ABDOMINAL EXPOSURE;  Surgeon: Oris Krystal FALCON, MD;  Location: [facility] OR;  Service: Vascular;  Laterality: N/A;   ANTERIOR LAT LUMBAR FUSION N/A 05/01/2020   Procedure: ANTERIOR LATERAL LUMBAR FUSION L4-S1;  Surgeon: [name], MD;  Location: [facility] OR;  Service: Orthopedics;  Laterality: N/A;  4.5 hrs Dr. Oris to do approach tap block with exparel    ANTERIOR LAT LUMBAR FUSION N/A 04/03/2024   Procedure: EXTREME LATERAL INTERBODY FUSION LUMBAR TWO - THREE TO LUMBAR THREE - FOUR;  Surgeon: [name], MD;  Location: [facility] OR;  Service: Orthopedics;  Laterality: N/A;  XLIF L2-3, L3-4   CARPAL TUNNEL RELEASE Bilateral 1990's   COLONOSCOPY     KNEE ARTHROSCOPY W/ MENISCECTOMY Left 07/2014   LEFT HEART CATHETERIZATION WITH CORONARY ANGIOGRAM N/A 02/06/2013   Procedure: LEFT HEART CATHETERIZATION WITH CORONARY ANGIOGRAM;  Surgeon: Deatrice DELENA Cage, MD;  Location: [facility] C[facility];  Service: Cardiovascular;  Laterality: N/A;  non-obstruction 25% mLAD,  normal LVF , ef 65-70%   NASAL SINUS SURGERY     OVARIAN CYST REMOVAL Left    ROTATOR CUFF  REPAIR Right 10/2016   TOTAL KNEE ARTHROPLASTY Left 03/26/2015   Procedure: LEFT TOTAL KNEE ARTHROPLASTY;  Surgeon: Donnice Car, MD;  Location: [facility] ORS;  Service: Orthopedics;  Laterality: Left;   Patient Active Problem List   Diagnosis Date Noted   Pulmonary embolism (HCC) 04/27/2024   Acute pulmonary embolism (HCC) 04/25/2024   Acute deep vein thrombosis (DVT) of left lower extremity (HCC) 04/25/2024   S/P lumbar fusion 05/01/2020   Hypertension 11/20/2019   Family history of early CAD 07/15/2017   Elevated blood pressure reading in office without diagnosis of hypertension 07/15/2017   Obese 03/27/2015   S/P left TKA 03/26/2015   S/P knee replacement 03/26/2015   Colitis, acute 12/01/2014   Weight loss, unintentional 12/01/2014   Adnexal cyst 12/01/2014   Elevated fasting glucose 12/01/2014   CAD (coronary artery disease) 12/01/2014    Osteoarthritis 12/01/2014   Vasovagal attack 02/28/2013   Hyperlipidemia 02/28/2013    PCP: Prentice Blush MD  REFERRING PROVIDER: Donaciano Sprang MD  REFERRING DIAG: M54.50 (ICD-10-CM) - Low back pain, unspecified   Rationale for Evaluation and Treatment: Rehabilitation  THERAPY DIAG:  Other low back pain  Muscle weakness (generalized)  Bilateral ankle pain, unspecified chronicity  ONSET DATE: June 2025  SUBJECTIVE:                                                                                                                                                                                           SUBJECTIVE STATEMENT: Back is good. LLE 3/10. Slept ok for me     Initial Subjective 3 weeks post surgery bl clots lung and lle.  On bl thinner better. Trying to ride elliptical up towards 40 mins a day at home in 2 sessions.  I do have some residual circulation problems through LLE primarily ankle and anterior calf area.  Prior to surgery I had right sided nerve pain into right knee and some calf now is is better. Only have pain there now after lying down at night after about 10 minutes.  I get up more than 5-6 times nightly due to pain.  No pain during day when standing. I have tendonitis both ankle chronic, limits some of my walking more than my back. Not as steady  PERTINENT HISTORY:  Surgery 04/03/24: 09/05/24 Dr sprang Pt is now 5 months out from a XLIF L2-4 with supplemental pedicle screw fixation L2-S1. Patient has had a prior L4-S1 instrumented fusion and we extended this to L2 .  04/25/2024 she was admitted to the hospital and diagnosed with a PE and lower extremity DVT   L TKR R Knee OA  PAIN:  Are you having pain? Yes: NPRS scale: 5/10 Pain location: lower back   Pain description: sore Aggravating factors: supine/lying down Relieving factors: lying down short while     PRECAUTIONS: Fall   RED FLAGS: None      WEIGHT BEARING RESTRICTIONS: No   FALLS:  Has  patient fallen in last 6 months? No   LIVING ENVIRONMENT: Lives with: lives with their spouse Lives in: House/apartment Stairs: 3-4 step going into home no handrail Has following equipment at home: None   OCCUPATION: retired.   PLOF: Independent  PATIENT GOALS: sleeping better, energy, walking further, improve balance  NEXT MD VISIT: 6 months  OBJECTIVE:  Note: Objective measures were completed at Evaluation unless otherwise noted.  DIAGNOSTIC FINDINGS:  NA  PATIENT SURVEYS:  ODI:   11/07/24: 14/50=28% COGNITION: Overall cognitive status: Within functional limits for tasks assessed     SENSATION: WFL  MUSCLE LENGTH: Hamstrings: wfl   POSTURE: rounded shoulders, forward head, and decreased lumbar lordosis  PALPATION: No TTP  LUMBAR ROM:   AROM eval  Flexion NT  Extension wfl  Right lateral flexion wfl  Left lateral flexion Wfl Mild discomfort  Right rotation   Left rotation    (Blank rows = not tested)  LOWER EXTREMITY ROM:     wfl   LOWER EXTREMITY MMT:    MMT Right eval Left eval R / L 11/07/24  Hip flexion 24.0 26.6 35.1 / 43.1  Hip extension     Hip abduction 23.7 25.6 28.8 / 33.2  Hip adduction     Hip internal rotation     Hip external rotation     Knee flexion 4 4   Knee extension     Ankle dorsiflexion 4 4   Ankle plantarflexion     Ankle inversion     Ankle eversion      (Blank rows = not tested)  LUMBAR SPECIAL TESTS:  Straight leg raise test: Negative  FUNCTIONAL TESTS:  5 times sit to stand: 17.54 Timed up and go (TUG): 13.57. Pt needs extra time to rise then lower from chair 4 stage balance: passed 1&2.  Tandem unsteady but able to hold 15s.  SLS x 3 s   11/07/24: 5 x STS: 13.86      TUG: 11.39      Tandem 20s; SLS 12s     GAIT: Distance walked: 500 ft Assistive device utilized: None Level of assistance: Complete Independence Comments: wfl  TREATMENT   [facility] Adult PT Treatment:                                                 DATE: 11/07/24 Pt seen for aquatic therapy today.  Treatment took place in water 3.5-4.75 ft in depth at the [facility] pool. Temp of water was 91.  Pt entered/exited the pool via stairs using alternating pattern with hand rail.  *unsupported walking forward, backward and side stepping  *forward and backward march with kick * suitcase carry using bilat then single yellow hand float marching forward/back *solid noodle pull down for TrA engagement wide stance then staggered stances x 10 * side stepping with arm add/abdct with yellow hand floats  *STS from 3rd step 2 x 5.  VC and demonstration on execution. *Plank on steps with hip ext x 8 *decompression yellow noodle under arms behind  back: cycling; hip add/abd then flex/extension  PN testing completed  Pt requires the buoyancy and hydrostatic pressure of water for support, and to offload joints by unweighting joint load by at least 50 % in navel deep water and by at least 75-80% in chest to neck deep water.  Viscosity of the water is needed for resistance of strengthening. Water current perturbations provides challenge to standing balance requiring increased core activation.   [facility] Adult PT Treatment:                                                DATE: 10/25/24  Therapeutic Exercise: Elliptical L1 x 5 min  Review of current exercises she completes (SLR - encouraged to bend opposite knee; single knee to chest- encouraged to bend opposite knee; hip abdct in sidelying) Quadruped opposite arm/ leg lifts x 5 each side (some pain in Lt knee with kneeling) Standing bil shoulder ext with red band in staggered stance with TrA set x 10 each LE forward Standing bil low row with green band in staggered stance with TrA set x 10 each LE forward Bow and arrow with step back with TrA set x 10 each LE forward, green band Hip abdct in sidelying with small pulses x 12 each LE, core engaged Bridge with 5 sec hold x 10 Hook lying  piriformis stretch 20 sec                                                                                                                                   PATIENT EDUCATION:  Education details: reacquainting to aquatic therapy Person educated: Patient Education method: Explanation Education comprehension: verbalized understanding  HOME EXERCISE PROGRAM: Issued last episode: AQUATIC Access Code: [email or web address] URL: [email or web address] Date: 02/14/2024  Land Access Code: [email or web address] URL: [email or web address] Date: 10/25/2024  - issued with red and green band   ASSESSMENT:  CLINICAL IMPRESSION: Pt with good response to last session. Good toleration to progressed core strengthening today adding planks and STS from low position.  VC and demonstration provided for proper body mechanics with STS in turn with good execution.  Reports mild LB pressure initially with core execution but reduces with reps.  Pt reports continued improvement with stair climbing. PN: Pt demonstrates improvement in all areas of functional and objective tests. She reports very minimal LBP. She does continue to have ankle pain which is variable although it continues to limit her ability to tolerate amb > 20 mins. Waking at night is frequent but reportedly is due to venous insufficiency of LLE rather than LBP. She has met a majority of her goals and will continue to benefit from skilled PT intervention with focus in aquatics to continue using the properties of water to progress toward land based goals and to assist in venous circulation  through LE's    Pt has made good progress with therapy reporting good response from aquatic sessions with very little to no LBP.  Shre complete stair climbing using alternating pattern without difficulty.  Does use slight rue on hand rail for safety meeting goal. 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 The hydrostatic pressure assists the venous return while submerged improving symptoms experienced in LLE throughout session. Plan going forward will be determined by MD appt tomorrow.  Discussed progressing to land based intervention for a few visits, progressing out of aquatics. Re-cert to be completed 1/13. Goals ongoing    OBJECTIVE IMPAIRMENTS: decreased activity tolerance, decreased balance, decreased endurance, decreased mobility, difficulty walking, decreased ROM, decreased strength, impaired flexibility, postural dysfunction, and pain.   ACTIVITY LIMITATIONS: carrying, lifting, bending, squatting, sleeping, stairs, transfers, and locomotion level  PARTICIPATION LIMITATIONS: shopping, community activity, and yard work  [facility] POTENTIAL: Good  CLINICAL DECISION MAKING: Evolving/moderate complexity  EVALUATION COMPLEXITY: Moderate   GOALS: Goals reviewed with patient? Yes  SHORT TERM GOALS: Target date: 10/04/24  Pt will tolerate full aquatic sessions consistently without increase in pain and with improving function to demonstrate good toleration and effectiveness of intervention.  Baseline: Goal status: MET- 10/23/24  2.  Pt will tolerate stair climbing using alternating pattern ascending and descending 6 steps without use of handrail Baseline:  Goal status: In PRogress - 10/23/24; Met1/6/26  3.  Pt will report improved toleration to resting/sleeping in recliner vs bed for improved resting as nerves heal Baseline:  Goal status: MET- 10/23/24   LONG TERM GOALS: Target date: 01/05/25  Pt to improve on ODI by 13% to demonstrate statistically significant Improvement in function. (MCID 13-15%) Baseline: 19/50=38%; 11/07/24: 14/50=28% Goal status: In Progress 11/07/24  2.  Pt will improve amb toleration to > 1 hour without limitation of ankle pain by 50% Baseline:  Goal status: In progress 11/07/24  3. Pt will improve strength in all areas listed by   5lb  to demonstrate improved overall physical function Baseline: see chart Goal status: Met 11/07/24  4.  Pt will report reduced frequency of waking at night to <3x Baseline: >6 Goal status: In progress 11/07/24  5.  Pt will improve on 5 X STS test to <or= 12s to demonstrate improving functional lower extremity strength, transitional movements, and balance. (MDC = 4.2sec)  Baseline: 17.54; 13.86 Goal status: In progress 11/07/24  PLAN:  PT FREQUENCY: 1-2x/week  PT DURATION: 8 weeks  PLANNED INTERVENTIONS: 97164- PT Re-evaluation, 97750- Physical Performance Testing, 97110-Therapeutic exercises, 97530- Therapeutic activity, 97112- Neuromuscular re-education, 97535- Self Care, 02859- Manual therapy, [phone]- Gait training, [phone]- Aquatic Therapy, [phone] (1-2 muscles), 20561 (3+ muscles)- Dry Needling, Patient/Family education, Balance training, Stair training, Taping, Joint mobilization, DME instructions, Cryotherapy, and Moist heat.  PLAN FOR NEXT SESSION: aquatics for lumbosacral strengthening,, pain management, stretching,    Ronal Foots) Brenlynn Fake MPT 11/07/2024 12:40 PM [facility] [name] [place], [place], [address] Phone: [phone]   Fax:  [phone]     Referring diagnosis? Low back pain Treatment diagnosis? (if different than referring diagnosis) added ankle pain What was this (referring dx) caused by? [x]  Surgery []  Fall [x]  Ongoing issue []  Arthritis []  Other: ____________  Laterality: []  Rt []  Lt [x]  Both  Check all possible CPT codes:  *CHOOSE 10 OR LESS*    See Planned Interventions listed in the Plan section of the Evaluation.    "

## 2024-11-14 ENCOUNTER — Ambulatory Visit: Attending: Vascular Surgery | Admitting: Vascular Surgery

## 2024-11-14 DIAGNOSIS — I87002 Postthrombotic syndrome without complications of left lower extremity: Secondary | ICD-10-CM | POA: Diagnosis not present

## 2024-11-14 NOTE — Progress Notes (Signed)
 "      Virtual Visit via Telephone Note  Referring MD: Prentice Blush, MD  I connected with Carol Cox on 11/14/2024 using the [email or web address] by telephone and verified that I was speaking with the correct person using two identifiers. Patient was located at home alone and I am located at in the office.   The limitations of evaluation and management by telemedicine and the availability of in person appointments have been previously discussed with the patient and are documented in the patients chart. The patient expressed understanding and consented to proceed.  Chief Complaint: left leg pain and swelling  History of Present Illness: Carol Cox is a 74 y.o. 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  We are having a phone call to discuss her options moving forward.  Since our last visit she has continued edema and pain particularly with activity of the left lower extremity.  She has been compliant with thigh-high compression stockings and has initiated aspirin  therapy.  Past Medical History:  Diagnosis Date   Allergy    seasonal   Arthritis    knee, left hip, ankles    Borderline glaucoma    CAD (coronary artery disease)    Mild nonobstructive CAD by cath 02/06/13 (25% mid LAD), normal EF   Chronic low back pain    Colitis    Coronary arteriosclerosis    DDD (degenerative disc disease), lumbar    Degeneration of intervertebral disc of lumbar region    Family history of adverse reaction to anesthesia    mother has problems with nausea and vomiting    Glaucoma    History of colitis    02/ 2016  acute infectious colitis-- resolved   History of total knee arthroplasty    Hyperglycemia    Hyperlipidemia    Hypertension    Left ovarian cyst    Low back pain    with bilateral hip radiation    Lumbar radiculopathy    Lumbar spondylolysis    Obesity    Osteoporosis     ospenia of hips   Pain in joint of left shoulder    Pain of right hip joint    PONV (postoperative nausea and vomiting)    SEVERE    Past Surgical History:  Procedure Laterality Date   ABDOMINAL EXPOSURE N/A 05/01/2020   Procedure: ABDOMINAL EXPOSURE;  Surgeon: Oris Krystal FALCON, MD;  Location: [facility] OR;  Service: Vascular;  Laterality: N/A;   ANTERIOR LAT LUMBAR FUSION N/A 05/01/2020   Procedure: ANTERIOR LATERAL LUMBAR FUSION L4-S1;  Surgeon: [name], MD;  Location: [facility] OR;  Service: Orthopedics;  Laterality: N/A;  4.5 hrs Dr. Oris to do approach tap block with exparel    ANTERIOR LAT LUMBAR FUSION N/A 04/03/2024   Procedure: EXTREME LATERAL INTERBODY FUSION LUMBAR TWO - THREE TO LUMBAR THREE - FOUR;  Surgeon: [name], MD;  Location: [facility] OR;  Service: Orthopedics;  Laterality: N/A;  XLIF L2-3, L3-4   CARPAL TUNNEL RELEASE Bilateral 1990's   COLONOSCOPY     KNEE ARTHROSCOPY W/ MENISCECTOMY Left 07/2014   LEFT HEART CATHETERIZATION WITH CORONARY ANGIOGRAM N/A 02/06/2013   Procedure: LEFT HEART CATHETERIZATION WITH CORONARY ANGIOGRAM;  Surgeon: Deatrice DELENA Cage, MD;  Location: [facility];  Service: Cardiovascular;  Laterality: N/A;  non-obstruction 25% mLAD,  normal LVF , ef 65-70%   NASAL SINUS SURGERY     OVARIAN CYST REMOVAL Left  ROTATOR CUFF REPAIR Right 10/2016   TOTAL KNEE ARTHROPLASTY Left 03/26/2015   Procedure: LEFT TOTAL KNEE ARTHROPLASTY;  Surgeon: Donnice Car, MD;  Location: [facility];  Service: Orthopedics;  Laterality: Left;    Active Medications[1]  12 system ROS was negative unless otherwise noted in HPI. She has continued leg pain and swelling   Observations/Objective: Patient demonstrates good understanding of our discussion  Assessment and Plan: 74 year old female with history as above.  We again discussed her options being continued compression stockings and activity as well as water therapy which we will refer her for further visits at [facility].  We  also discussed proceeding with left lower extremity venography and if we are able to place a stent she would require Lovenox  for 3 to 4 weeks and transition to anticoagulation for a time following.  We also discussed that we may be unsuccessful with crossing the previously occluded extrailiac vein and we could then discuss general anesthesia with left common femoral vein and right neck approach.  Her last option would be open surgical intervention which I would reserve for severe symptoms.  All questions were answered we will get her scheduled for venogram from a left popliteal approach with possible intervention on Monday in the near future and she can continue aspirin  perioperatively.  Follow Up Instructions:   I discussed the assessment and treatment plan with the patient. The patient was provided an opportunity to ask questions and all were answered. The patient agreed with the plan and demonstrated an understanding of the instructions.   The patient was advised to call back or seek an in-person evaluation if the symptoms worsen or if the condition fails to improve as anticipated.  I spent 10 minutes with the patient via telephone encounter.   Signed, Penne BROCKS. Sheree, MD [facility] Office: [phone] Pager: [phone]   11/14/2024, 9:34 AM     [1]  No outpatient medications have been marked as taking for the 11/14/24 encounter (Appointment) with Sheree Penne Bruckner, MD.   "

## 2024-11-15 ENCOUNTER — Other Ambulatory Visit: Payer: Self-pay

## 2024-11-15 ENCOUNTER — Ambulatory Visit (HOSPITAL_BASED_OUTPATIENT_CLINIC_OR_DEPARTMENT_OTHER): Admitting: Physical Therapy

## 2024-11-15 DIAGNOSIS — M5459 Other low back pain: Secondary | ICD-10-CM | POA: Diagnosis not present

## 2024-11-15 DIAGNOSIS — M6281 Muscle weakness (generalized): Secondary | ICD-10-CM

## 2024-11-15 DIAGNOSIS — I87002 Postthrombotic syndrome without complications of left lower extremity: Secondary | ICD-10-CM

## 2024-11-15 NOTE — Therapy (Signed)
 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    Authorization Time Period 18 visits approved  From 11.20.2025 - 01.16.2026  Authorization #[id number]   [employer] ([employer])    Authorization - Visit Number 9    Authorization - Number of Visits 18    Progress Note Due on Visit 18    PT Start Time 1345    PT Stop Time 1425    PT Time Calculation (min) 40 min    Activity Tolerance Patient tolerated treatment well    Behavior During Therapy WFL for tasks assessed/performed              Past Medical History:  Diagnosis Date   Allergy    seasonal   Arthritis    knee, left hip, ankles    Borderline glaucoma    CAD (coronary artery disease)    Mild nonobstructive CAD by cath 02/06/13 (25% mid LAD), normal EF   Chronic low back pain    Colitis    Coronary arteriosclerosis    DDD (degenerative disc disease), lumbar    Degeneration of intervertebral disc of lumbar region    Family history of adverse reaction to anesthesia    mother has problems with nausea and vomiting    Glaucoma    History of colitis    02/ 2016  acute infectious colitis-- resolved   History of total knee arthroplasty    Hyperglycemia    Hyperlipidemia    Hypertension    Left ovarian cyst    Low back pain    with bilateral hip radiation    Lumbar radiculopathy    Lumbar spondylolysis    Obesity    Osteoporosis    ospenia of hips   Pain in joint of left shoulder    Pain of right hip joint    PONV (postoperative nausea and vomiting)    SEVERE   Past Surgical History:  Procedure Laterality Date   ABDOMINAL EXPOSURE N/A 05/01/2020   Procedure: ABDOMINAL EXPOSURE;  Surgeon: Oris Krystal FALCON, MD;  Location: [facility] OR;  Service: Vascular;  Laterality: N/A;    ANTERIOR LAT LUMBAR FUSION N/A 05/01/2020   Procedure: ANTERIOR LATERAL LUMBAR FUSION L4-S1;  Surgeon: [name], MD;  Location: [facility] OR;  Service: Orthopedics;  Laterality: N/A;  4.5 hrs Dr. Oris to do approach tap block with exparel    ANTERIOR LAT LUMBAR FUSION N/A 04/03/2024   Procedure: EXTREME LATERAL INTERBODY FUSION LUMBAR TWO - THREE TO LUMBAR THREE - FOUR;  Surgeon: [name], MD;  Location: [facility] OR;  Service: Orthopedics;  Laterality: N/A;  XLIF L2-3, L3-4   CARPAL TUNNEL RELEASE Bilateral 1990's   COLONOSCOPY     KNEE ARTHROSCOPY W/ MENISCECTOMY Left 07/2014   LEFT HEART CATHETERIZATION WITH CORONARY ANGIOGRAM N/A 02/06/2013   Procedure: LEFT HEART CATHETERIZATION WITH CORONARY ANGIOGRAM;  Surgeon: Deatrice DELENA Cage, MD;  Location: [facility] CATH LAB;  Service: Cardiovascular;  Laterality: N/A;  non-obstruction 25% mLAD,  normal LVF , ef 65-70%   NASAL SINUS SURGERY     OVARIAN CYST REMOVAL Left    ROTATOR CUFF REPAIR Right 10/2016   TOTAL KNEE ARTHROPLASTY Left 03/26/2015   Procedure: LEFT TOTAL KNEE ARTHROPLASTY;  Surgeon: Donnice Car, MD;  Location:  [facility] ORS;  Service: Orthopedics;  Laterality: Left;   Patient Active Problem List   Diagnosis Date Noted   Pulmonary embolism (HCC) 04/27/2024   Acute pulmonary embolism (HCC) 04/25/2024   Acute deep vein thrombosis (DVT) of left lower extremity (HCC) 04/25/2024   S/P lumbar fusion 05/01/2020   Hypertension 11/20/2019   Family history of early CAD 07/15/2017   Elevated blood pressure reading in office without diagnosis of hypertension 07/15/2017   Obese 03/27/2015   S/P left TKA 03/26/2015   S/P knee replacement 03/26/2015   Colitis, acute 12/01/2014   Weight loss, unintentional 12/01/2014   Adnexal cyst 12/01/2014   Elevated fasting glucose 12/01/2014   CAD (coronary artery disease) 12/01/2014   Osteoarthritis 12/01/2014   Vasovagal attack 02/28/2013   Hyperlipidemia 02/28/2013    PCP: Prentice Blush MD  REFERRING  PROVIDER: Donaciano Sprang MD  REFERRING DIAG: M54.50 (ICD-10-CM) - Low back pain, unspecified   Rationale for Evaluation and Treatment: Rehabilitation  THERAPY DIAG:  Other low back pain  Muscle weakness (generalized)  ONSET DATE: June 2025  SUBJECTIVE:                                                                                                                                                                                           SUBJECTIVE STATEMENT: Pt is scheduled for vein procedure on 11/29/24.  LLE has been more painful and tight lately.  She reports she continues to use elliptical almost daily (10-15 min, 2x/day) unless leg is really hurting. She reports compliance with compression stocking on LLE.  My back is feeling much better.  It hurts but the pain is better. Pt reports she was up on her feet longer this weekend with grandkids, that may be why my leg is hurting more.     Initial Subjective 3 weeks post surgery bl clots lung and lle.  On blood thinner better. Trying to ride elliptical up towards 40 mins a day at home in 2 sessions.  I do have some residual circulation problems through LLE primarily ankle and anterior calf area.  Prior to surgery I had right sided nerve pain into right knee and some calf now is is better. Only have pain there now after lying down at night after about 10 minutes.  I get up more than 5-6 times nightly due to pain.  No pain during day when standing. I have tendonitis both ankle chronic, limits some of my walking more than my back. Not as steady  PERTINENT HISTORY:  Surgery 04/03/24: 09/05/24 Dr sprang Pt is now 5 months out from a XLIF L2-4 with supplemental pedicle screw fixation L2-S1.  Patient has had a prior L4-S1 instrumented fusion and we extended this to L2 .  04/25/2024 she was admitted to the hospital and diagnosed with a PE and lower extremity DVT   L TKR R Knee OA   PAIN:  Are you having pain? Yes: NPRS scale: 4/10 LLE, 2/10 lower  back  Pain location: see above   Pain description: sore Aggravating factors: supine/lying down Relieving factors: lying down short while, elevating LLE     PRECAUTIONS: Fall   RED FLAGS: None      WEIGHT BEARING RESTRICTIONS: No   FALLS:  Has patient fallen in last 6 months? No   LIVING ENVIRONMENT: Lives with: lives with their spouse Lives in: House/apartment Stairs: 3-4 step going into home no handrail Has following equipment at home: None   OCCUPATION: retired.   PLOF: Independent  PATIENT GOALS: sleeping better, energy, walking further, improve balance  NEXT MD VISIT: 6 months  OBJECTIVE:  Note: Objective measures were completed at Evaluation unless otherwise noted.  DIAGNOSTIC FINDINGS:  NA  PATIENT SURVEYS:  ODI:   11/07/24: 14/50=28% COGNITION: Overall cognitive status: Within functional limits for tasks assessed     SENSATION: WFL  MUSCLE LENGTH: Hamstrings: wfl   POSTURE: rounded shoulders, forward head, and decreased lumbar lordosis  PALPATION: No TTP  LUMBAR ROM:   AROM eval  Flexion NT  Extension wfl  Right lateral flexion wfl  Left lateral flexion Wfl Mild discomfort  Right rotation   Left rotation    (Blank rows = not tested)  LOWER EXTREMITY ROM:     wfl   LOWER EXTREMITY MMT:    MMT Right eval Left eval R / L 11/07/24  Hip flexion 24.0 26.6 35.1 / 43.1  Hip extension     Hip abduction 23.7 25.6 28.8 / 33.2  Hip adduction     Hip internal rotation     Hip external rotation     Knee flexion 4 4   Knee extension     Ankle dorsiflexion 4 4   Ankle plantarflexion     Ankle inversion     Ankle eversion      (Blank rows = not tested)  LUMBAR SPECIAL TESTS:  Straight leg raise test: Negative  FUNCTIONAL TESTS:  5 times sit to stand: 17.54 Timed up and go (TUG): 13.57. Pt needs extra time to rise then lower from chair 4 stage balance: passed 1&2.  Tandem unsteady but able to hold 15s.  SLS x 3 s   11/07/24: 5 x  STS: 13.86      TUG: 11.39      Tandem 20s; SLS 12s     GAIT: Distance walked: 500 ft Assistive device utilized: None Level of assistance: Complete Independence Comments: wfl  TREATMENT   [facility] Adult PT Treatment:                                                DATE: 11/15/24 Pt seen for aquatic therapy today.  Treatment took place in water 3.5-4.75 ft in depth at the [facility] pool. Temp of water was 91.  Pt entered/exited the pool via stairs using alternating pattern with hand rail.  *unsupported walking forward, backward and side stepping  * suitcase carry using bilat then single yellow hand float marching forward/back * side stepping with arm add/abdct with rainbow hand floats *  solid noodle pull down for TrA engagement wide stance then staggered stances x 10 * solid noodle stomp x 12 each LE,  * mini squats on solid noodle x 10 *Plank with hands on bench in water: alternating hip extension; slow mountain climbers; fire hydrants *decompression yellow noodle under arms behind back: cycling; hip add/abd then flex/extension   Pt requires the buoyancy and hydrostatic pressure of water for support, and to offload joints by unweighting joint load by at least 50 % in navel deep water and by at least 75-80% in chest to neck deep water.  Viscosity of the water is needed for resistance of strengthening. Water current perturbations provides challenge to standing balance requiring increased core activation.   [facility] Adult PT Treatment:                                                DATE: 10/25/24  Therapeutic Exercise: Elliptical L1 x 5 min  Review of current exercises she completes (SLR - encouraged to bend opposite knee; single knee to chest- encouraged to bend opposite knee; hip abdct in sidelying) Quadruped opposite arm/ leg lifts x 5 each side (some pain in Lt knee with kneeling) Standing bil shoulder ext with red band in staggered stance with TrA set x 10 each LE  forward Standing bil low row with green band in staggered stance with TrA set x 10 each LE forward Bow and arrow with step back with TrA set x 10 each LE forward, green band Hip abdct in sidelying with small pulses x 12 each LE, core engaged Bridge with 5 sec hold x 10 Hook lying piriformis stretch 20 sec                                                                                                                                   PATIENT EDUCATION:  Education details: reacquainting to aquatic therapy Person educated: Patient Education method: Explanation Education comprehension: verbalized understanding  HOME EXERCISE PROGRAM: Issued last episode: AQUATIC Access Code: [email or web address] URL: [email or web address] Date: 02/14/2024  Land Access Code: [email or web address] URL: [email or web address] Date: 10/25/2024  - issued with red and green band   ASSESSMENT:  CLINICAL IMPRESSION: Pt tolerated aquatic exercises well. Elimination of back pain reported, but LLE pain remained unchanged.  Pt will continue to benefit from skilled PT intervention with focus in aquatics to continue using the properties of water to progress toward land based goals and to assist in venous circulation through LE's.  Will continue to progress as tolerated.     Pt has made good progress with therapy reporting good response from aquatic sessions with very little to no LBP.  Shre complete stair climbing using alternating pattern without difficulty.  Does use slight rue on hand rail for safety meeting goal. 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 The hydrostatic pressure assists the venous return while submerged improving symptoms experienced in LLE throughout session. Plan going forward will be determined by MD appt tomorrow.  Discussed progressing to land based intervention for a few visits, progressing out of  aquatics. Re-cert to be completed 1/13. Goals ongoing    OBJECTIVE IMPAIRMENTS: decreased activity tolerance, decreased balance, decreased endurance, decreased mobility, difficulty walking, decreased ROM, decreased strength, impaired flexibility, postural dysfunction, and pain.   ACTIVITY LIMITATIONS: carrying, lifting, bending, squatting, sleeping, stairs, transfers, and locomotion level  PARTICIPATION LIMITATIONS: shopping, community activity, and yard work  [facility] POTENTIAL: Good  CLINICAL DECISION MAKING: Evolving/moderate complexity  EVALUATION COMPLEXITY: Moderate   GOALS: Goals reviewed with patient? Yes  SHORT TERM GOALS: Target date: 10/04/24  Pt will tolerate full aquatic sessions consistently without increase in pain and with improving function to demonstrate good toleration and effectiveness of intervention.  Baseline: Goal status: MET- 10/23/24  2.  Pt will tolerate stair climbing using alternating pattern ascending and descending 6 steps without use of handrail Baseline:  Goal status:  Met1/6/26  3.  Pt will report improved toleration to resting/sleeping in recliner vs bed for improved resting as nerves heal Baseline:  Goal status: MET- 10/23/24   LONG TERM GOALS: Target date: 01/05/25  Pt to improve on ODI by 13% to demonstrate statistically significant Improvement in function. (MCID 13-15%) Baseline: 19/50=38%; 11/07/24: 14/50=28% Goal status: In Progress 11/07/24  2.  Pt will improve amb toleration to > 1 hour without limitation of ankle pain by 50% Baseline:  Goal status: In progress 11/07/24  3. Pt will improve strength in all areas listed by  5lb  to demonstrate improved overall physical function Baseline: see chart Goal status: Met 11/07/24  4.  Pt will report reduced frequency of waking at night to <3x Baseline: >6 Goal status: In progress 11/07/24  5.  Pt will improve on 5 X STS test to <or= 12s to demonstrate improving functional lower extremity  strength, transitional movements, and balance. (MDC = 4.2sec)  Baseline: 17.54; 13.86 Goal status: In progress 11/07/24  PLAN:  PT FREQUENCY: 1-2x/week  PT DURATION: 8 weeks  PLANNED INTERVENTIONS: 97164- PT Re-evaluation, 97750- Physical Performance Testing, 97110-Therapeutic exercises, 97530- Therapeutic activity, 97112- Neuromuscular re-education, 97535- Self Care, [id number]- Manual therapy, [phone]- Gait training, [phone]- Aquatic Therapy, [phone] (1-2 muscles), [id number] (3+ muscles)- Dry Needling, Patient/Family education, Balance training, Stair training, Taping, Joint mobilization, DME instructions, Cryotherapy, and Moist heat.  PLAN FOR NEXT SESSION: aquatics for lumbosacral strengthening,, pain management, stretching,   Delon Aquas, PTA 11/15/24 2:26 PM [facility] [address] [place], [place], [address] Phone: [phone]   Fax:  [phone]    Referring diagnosis? Low back pain Treatment diagnosis? (if different than referring diagnosis) added ankle pain What was this (referring dx) caused by? [x]  Surgery []  Fall [x]  Ongoing issue []  Arthritis []  Other: ____________  Laterality: []  Rt []  Lt [x]  Both  Check all possible CPT codes:  *CHOOSE 10 OR LESS*    See Planned Interventions listed in the Plan section of the Evaluation.    "

## 2024-11-17 ENCOUNTER — Ambulatory Visit (HOSPITAL_BASED_OUTPATIENT_CLINIC_OR_DEPARTMENT_OTHER): Admitting: Physical Therapy

## 2024-11-17 ENCOUNTER — Encounter (HOSPITAL_BASED_OUTPATIENT_CLINIC_OR_DEPARTMENT_OTHER): Payer: Self-pay | Admitting: Physical Therapy

## 2024-11-17 DIAGNOSIS — M5459 Other low back pain: Secondary | ICD-10-CM

## 2024-11-17 DIAGNOSIS — M6281 Muscle weakness (generalized): Secondary | ICD-10-CM

## 2024-11-17 NOTE — Therapy (Signed)
 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    Authorization Time Period 18 visits approved  From 11.20.2025 - 01.16.2026  Authorization #[id number]   [employer] ([employer])    Authorization - Visit Number 10    Authorization - Number of Visits 18    Progress Note Due on Visit 18    PT Start Time 1017    PT Stop Time 1100    PT Time Calculation (min) 43 min    Activity Tolerance Patient tolerated treatment well    Behavior During Therapy WFL for tasks assessed/performed              Past Medical History:  Diagnosis Date   Allergy    seasonal   Arthritis    knee, left hip, ankles    Borderline glaucoma    CAD (coronary artery disease)    Mild nonobstructive CAD by cath 02/06/13 (25% mid LAD), normal EF   Chronic low back pain    Colitis    Coronary arteriosclerosis    DDD (degenerative disc disease), lumbar    Degeneration of intervertebral disc of lumbar region    Family history of adverse reaction to anesthesia    mother has problems with nausea and vomiting    Glaucoma    History of colitis    02/ 2016  acute infectious colitis-- resolved   History of total knee arthroplasty    Hyperglycemia    Hyperlipidemia    Hypertension    Left ovarian cyst    Low back pain    with bilateral hip radiation    Lumbar radiculopathy    Lumbar spondylolysis    Obesity    Osteoporosis    ospenia of hips   Pain in joint of left shoulder    Pain of right hip joint    PONV (postoperative nausea and vomiting)    SEVERE   Past Surgical History:  Procedure Laterality Date   ABDOMINAL EXPOSURE N/A 05/01/2020   Procedure: ABDOMINAL EXPOSURE;  Surgeon: Oris Krystal FALCON, MD;  Location: [facility] OR;  Service: Vascular;  Laterality: N/A;    ANTERIOR LAT LUMBAR FUSION N/A 05/01/2020   Procedure: ANTERIOR LATERAL LUMBAR FUSION L4-S1;  Surgeon: [name], MD;  Location: [facility] OR;  Service: Orthopedics;  Laterality: N/A;  4.5 hrs Dr. Oris to do approach tap block with exparel    ANTERIOR LAT LUMBAR FUSION N/A 04/03/2024   Procedure: EXTREME LATERAL INTERBODY FUSION LUMBAR TWO - THREE TO LUMBAR THREE - FOUR;  Surgeon: [name], MD;  Location: [facility] OR;  Service: Orthopedics;  Laterality: N/A;  XLIF L2-3, L3-4   CARPAL TUNNEL RELEASE Bilateral 1990's   COLONOSCOPY     KNEE ARTHROSCOPY W/ MENISCECTOMY Left 07/2014   LEFT HEART CATHETERIZATION WITH CORONARY ANGIOGRAM N/A 02/06/2013   Procedure: LEFT HEART CATHETERIZATION WITH CORONARY ANGIOGRAM;  Surgeon: Deatrice DELENA Cage, MD;  Location: [facility] CATH LAB;  Service: Cardiovascular;  Laterality: N/A;  non-obstruction 25% mLAD,  normal LVF , ef 65-70%   NASAL SINUS SURGERY     OVARIAN CYST REMOVAL Left    ROTATOR CUFF REPAIR Right 10/2016   TOTAL KNEE ARTHROPLASTY Left 03/26/2015   Procedure: LEFT TOTAL KNEE ARTHROPLASTY;  Surgeon: Donnice Car, MD;  Location:  [facility] ORS;  Service: Orthopedics;  Laterality: Left;   Patient Active Problem List   Diagnosis Date Noted   Pulmonary embolism (HCC) 04/27/2024   Acute pulmonary embolism (HCC) 04/25/2024   Acute deep vein thrombosis (DVT) of left lower extremity (HCC) 04/25/2024   S/P lumbar fusion 05/01/2020   Hypertension 11/20/2019   Family history of early CAD 07/15/2017   Elevated blood pressure reading in office without diagnosis of hypertension 07/15/2017   Obese 03/27/2015   S/P left TKA 03/26/2015   S/P knee replacement 03/26/2015   Colitis, acute 12/01/2014   Weight loss, unintentional 12/01/2014   Adnexal cyst 12/01/2014   Elevated fasting glucose 12/01/2014   CAD (coronary artery disease) 12/01/2014   Osteoarthritis 12/01/2014   Vasovagal attack 02/28/2013   Hyperlipidemia 02/28/2013    PCP: Prentice Blush MD  REFERRING  PROVIDER: Donaciano Sprang MD  REFERRING DIAG: M54.50 (ICD-10-CM) - Low back pain, unspecified   Rationale for Evaluation and Treatment: Rehabilitation  THERAPY DIAG:  Muscle weakness (generalized)  Other low back pain  ONSET DATE: June 2025  SUBJECTIVE:                                                                                                                                                                                           SUBJECTIVE STATEMENT: LBP 2/10; left LE pain 2/10.  Good day today  Pt is scheduled for vein procedure on 11/27/24.     Initial Subjective 3 weeks post surgery bl clots lung and lle.  On blood thinner better. Trying to ride elliptical up towards 40 mins a day at home in 2 sessions.  I do have some residual circulation problems through LLE primarily ankle and anterior calf area.  Prior to surgery I had right sided nerve pain into right knee and some calf now is is better. Only have pain there now after lying down at night after about 10 minutes.  I get up more than 5-6 times nightly due to pain.  No pain during day when standing. I have tendonitis both ankle chronic, limits some of my walking more than my back. Not as steady  PERTINENT HISTORY:  Surgery 04/03/24: 09/05/24 Dr sprang Pt is now 5 months out from a XLIF L2-4 with supplemental pedicle screw fixation L2-S1. Patient has had a prior L4-S1 instrumented fusion and we extended this to L2 .  04/25/2024 she was admitted to the hospital and diagnosed with a PE and lower extremity DVT   L TKR R Knee OA   PAIN:  Are you having pain? Yes: NPRS scale: 4/10 LLE, 2/10 lower back  Pain location: see above  Pain description: sore Aggravating factors: supine/lying down Relieving factors: lying down short while, elevating LLE     PRECAUTIONS: Fall   RED FLAGS: None      WEIGHT BEARING RESTRICTIONS: No   FALLS:  Has patient fallen in last 6 months? No   LIVING ENVIRONMENT: Lives with: lives with  their spouse Lives in: House/apartment Stairs: 3-4 step going into home no handrail Has following equipment at home: None   OCCUPATION: retired.   PLOF: Independent  PATIENT GOALS: sleeping better, energy, walking further, improve balance  NEXT MD VISIT: 6 months  OBJECTIVE:  Note: Objective measures were completed at Evaluation unless otherwise noted.  DIAGNOSTIC FINDINGS:  NA  PATIENT SURVEYS:  ODI:   11/07/24: 14/50=28% COGNITION: Overall cognitive status: Within functional limits for tasks assessed     SENSATION: WFL  MUSCLE LENGTH: Hamstrings: wfl   POSTURE: rounded shoulders, forward head, and decreased lumbar lordosis  PALPATION: No TTP  LUMBAR ROM:   AROM eval  Flexion NT  Extension wfl  Right lateral flexion wfl  Left lateral flexion Wfl Mild discomfort  Right rotation   Left rotation    (Blank rows = not tested)  LOWER EXTREMITY ROM:     wfl   LOWER EXTREMITY MMT:    MMT Right eval Left eval R / L 11/07/24  Hip flexion 24.0 26.6 35.1 / 43.1  Hip extension     Hip abduction 23.7 25.6 28.8 / 33.2  Hip adduction     Hip internal rotation     Hip external rotation     Knee flexion 4 4   Knee extension     Ankle dorsiflexion 4 4   Ankle plantarflexion     Ankle inversion     Ankle eversion      (Blank rows = not tested)  LUMBAR SPECIAL TESTS:  Straight leg raise test: Negative  FUNCTIONAL TESTS:  5 times sit to stand: 17.54 Timed up and go (TUG): 13.57. Pt needs extra time to rise then lower from chair 4 stage balance: passed 1&2.  Tandem unsteady but able to hold 15s.  SLS x 3 s   11/07/24: 5 x STS: 13.86      TUG: 11.39      Tandem 20s; SLS 12s     GAIT: Distance walked: 500 ft Assistive device utilized: None Level of assistance: Complete Independence Comments: wfl  TREATMENT   [facility] Adult PT Treatment:                                                DATE: 11/17/24 Pt seen for aquatic therapy today.  Treatment took place  in water 3.5-4.75 ft in depth at the [facility] pool. Temp of water was 91.  Pt entered/exited the pool via stairs using alternating pattern with hand rail.  *unsupported walking forward, backward and side stepping  * side stepping with arm add/abdct with rainbow hand floats * suitcase carry using bilat then single yellow hand float marching forward/back *solid black noodle pull down for TrA engagement wide stance then staggered stances x 10 * solid noodle stomp x 12 each LE, hip in neutral then ext rotation *Plank with hands on bench in water: alternating hip extension; *Seated on bench;cycling; hip add/abd  *STS from water bench onto floor 2 x 10 *cycling on noodle  Pt requires the buoyancy  and hydrostatic pressure of water for support, and to offload joints by unweighting joint load by at least 50 % in navel deep water and by at least 75-80% in chest to neck deep water.  Viscosity of the water is needed for resistance of strengthening. Water current perturbations provides challenge to standing balance requiring increased core activation.   [facility] Adult PT Treatment:                                                DATE: 10/25/24  Therapeutic Exercise: Elliptical L1 x 5 min  Review of current exercises she completes (SLR - encouraged to bend opposite knee; single knee to chest- encouraged to bend opposite knee; hip abdct in sidelying) Quadruped opposite arm/ leg lifts x 5 each side (some pain in Lt knee with kneeling) Standing bil shoulder ext with red band in staggered stance with TrA set x 10 each LE forward Standing bil low row with green band in staggered stance with TrA set x 10 each LE forward Bow and arrow with step back with TrA set x 10 each LE forward, green band Hip abdct in sidelying with small pulses x 12 each LE, core engaged Bridge with 5 sec hold x 10 Hook lying piriformis stretch 20 sec                                                                                                                                    PATIENT EDUCATION:  Education details: reacquainting to aquatic therapy Person educated: Patient Education method: Explanation Education comprehension: verbalized understanding  HOME EXERCISE PROGRAM: Issued last episode: AQUATIC Access Code: [email or web address] URL: [email or web address] Date: 02/14/2024  Land Access Code: [email or web address] URL: [email or web address] Date: 10/25/2024  - issued with red and green band   ASSESSMENT:  CLINICAL IMPRESSION: Some waxing and waning of LLE edema.  LBP a little more pain sensitive today. Toleration to added intensity of LE and core strengthening good. She reports no reduction in lle pain but complete reduction in LB. Plan to add more balance challenges next session as tolerated.  Goals ongoing     Pt has made good progress with therapy reporting good response from aquatic sessions with very little to no LBP.  Shre complete stair climbing using alternating pattern without difficulty.  Does use slight rue on hand rail for safety meeting goal. 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 The hydrostatic pressure assists the venous return while submerged improving symptoms experienced in LLE throughout session. Plan going forward will be determined by MD appt tomorrow.  Discussed progressing to land based intervention for a few visits, progressing out of aquatics. Re-cert to be completed 1/13. Goals ongoing    OBJECTIVE IMPAIRMENTS: decreased activity tolerance,  decreased balance, decreased endurance, decreased mobility, difficulty walking, decreased ROM, decreased strength, impaired flexibility, postural dysfunction, and pain.   ACTIVITY LIMITATIONS: carrying, lifting, bending, squatting, sleeping, stairs, transfers, and locomotion level  PARTICIPATION LIMITATIONS: shopping, community activity, and  yard work  [facility] POTENTIAL: Good  CLINICAL DECISION MAKING: Evolving/moderate complexity  EVALUATION COMPLEXITY: Moderate   GOALS: Goals reviewed with patient? Yes  SHORT TERM GOALS: Target date: 10/04/24  Pt will tolerate full aquatic sessions consistently without increase in pain and with improving function to demonstrate good toleration and effectiveness of intervention.  Baseline: Goal status: MET- 10/23/24  2.  Pt will tolerate stair climbing using alternating pattern ascending and descending 6 steps without use of handrail Baseline:  Goal status:  Met1/6/26  3.  Pt will report improved toleration to resting/sleeping in recliner vs bed for improved resting as nerves heal Baseline:  Goal status: MET- 10/23/24   LONG TERM GOALS: Target date: 01/05/25  Pt to improve on ODI by 13% to demonstrate statistically significant Improvement in function. (MC[facility] 13-15%) Baseline: 19/50=38%; 11/07/24: 14/50=28% Goal status: In Progress 11/07/24  2.  Pt will improve amb toleration to > 1 hour without limitation of ankle pain by 50% Baseline:  Goal status: In progress 11/07/24  3. Pt will improve strength in all areas listed by  5lb  to demonstrate improved overall physical function Baseline: see chart Goal status: Met 11/07/24  4.  Pt will report reduced frequency of waking at night to <3x Baseline: >6 Goal status: In progress 11/07/24  5.  Pt will improve on 5 X STS test to <or= 12s to demonstrate improving functional lower extremity strength, transitional movements, and balance. (MDC = 4.2sec)  Baseline: 17.54; 13.86 Goal status: In progress 11/07/24  PLAN:  PT FREQUENCY: 1-2x/week  PT DURATION: 8 weeks  PLANNED INTERVENTIONS: 97164- PT Re-evaluation, 97750- Physical Performance Testing, 97110-Therapeutic exercises, 97530- Therapeutic activity, 97112- Neuromuscular re-education, 97535- Self Care, 02859- Manual therapy, [phone]- Gait training, [phone]- Aquatic Therapy, [phone] (1-2  muscles), [phone] (3+ muscles)- Dry Needling, Patient/Family education, Balance training, Stair training, Taping, Joint mobilization, DME instructions, Cryotherapy, and Moist heat.  PLAN FOR NEXT SESSION: aquatics for lumbosacral strengthening,, pain management, stretching,   Ronal Foots) Mariann Palo MPT 11/17/24 10:57 AM [facility] [address] [place], [place], [address] Phone: [phone]   Fax:  [phone]     Referring diagnosis? Low back pain Treatment diagnosis? (if different than referring diagnosis) added ankle pain What was this (referring dx) caused by? [x]  Surgery []  Fall [x]  Ongoing issue []  Arthritis []  Other: ____________  Laterality: []  Rt []  Lt [x]  Both  Check all possible CPT codes:  *CHOOSE 10 OR LESS*    See Planned Interventions listed in the Plan section of the Evaluation.    "

## 2024-11-21 ENCOUNTER — Ambulatory Visit (HOSPITAL_BASED_OUTPATIENT_CLINIC_OR_DEPARTMENT_OTHER): Payer: Self-pay | Admitting: Physical Therapy

## 2024-11-28 ENCOUNTER — Telehealth: Payer: Self-pay | Admitting: Internal Medicine

## 2024-11-28 NOTE — Telephone Encounter (Signed)
 Blood pressure log   1/10-140/78 1/11-136/73 1/12-137-77 1/13-125/73 1/16-137/79 1/18-140/82 1/21-127/72 1/25-130/70 2/3-145/79

## 2024-11-29 NOTE — Telephone Encounter (Signed)
 Called pt advised of MD recommendation.  Pt had no questions or concerns.

## 2024-12-01 ENCOUNTER — Ambulatory Visit (HOSPITAL_BASED_OUTPATIENT_CLINIC_OR_DEPARTMENT_OTHER): Admitting: Physical Therapy

## 2024-12-01 ENCOUNTER — Encounter (HOSPITAL_BASED_OUTPATIENT_CLINIC_OR_DEPARTMENT_OTHER): Payer: Self-pay | Admitting: Physical Therapy

## 2024-12-01 DIAGNOSIS — M6281 Muscle weakness (generalized): Secondary | ICD-10-CM

## 2024-12-01 DIAGNOSIS — M5459 Other low back pain: Secondary | ICD-10-CM

## 2024-12-01 NOTE — Therapy (Signed)
 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    Authorization Time Period 18 visits approved  From 11.20.2025 - 01.16.2026  Authorization #[id number]   [employer] ([employer])    Authorization - Visit Number 11    Authorization - Number of Visits 18    Progress Note Due on Visit 18    PT Start Time 1017    PT Stop Time 1055    PT Time Calculation (min) 38 min    Activity Tolerance Patient tolerated treatment well    Behavior During Therapy WFL for tasks assessed/performed              Past Medical History:  Diagnosis Date   Allergy    seasonal   Arthritis    knee, left hip, ankles    Borderline glaucoma    CAD (coronary artery disease)    Mild nonobstructive CAD by cath 02/06/13 (25% mid LAD), normal EF   Chronic low back pain    Colitis    Coronary arteriosclerosis    DDD (degenerative disc disease), lumbar    Degeneration of intervertebral disc of lumbar region    Family history of adverse reaction to anesthesia    mother has problems with nausea and vomiting    Glaucoma    History of colitis    02/ 2016  acute infectious colitis-- resolved   History of total knee arthroplasty    Hyperglycemia    Hyperlipidemia    Hypertension    Left ovarian cyst    Low back pain    with bilateral hip radiation    Lumbar radiculopathy    Lumbar spondylolysis    Obesity    Osteoporosis    ospenia of hips   Pain in joint of left shoulder    Pain of right hip joint    PONV (postoperative nausea and vomiting)    SEVERE   Past Surgical History:  Procedure Laterality Date   ABDOMINAL EXPOSURE N/A 05/01/2020   Procedure: ABDOMINAL EXPOSURE;  Surgeon: Oris Krystal FALCON, MD;  Location: [facility] OR;  Service: Vascular;  Laterality: N/A;    ANTERIOR LAT LUMBAR FUSION N/A 05/01/2020   Procedure: ANTERIOR LATERAL LUMBAR FUSION L4-S1;  Surgeon: [name], MD;  Location: [facility] OR;  Service: Orthopedics;  Laterality: N/A;  4.5 hrs Dr. Oris to do approach tap block with exparel    ANTERIOR LAT LUMBAR FUSION N/A 04/03/2024   Procedure: EXTREME LATERAL INTERBODY FUSION LUMBAR TWO - THREE TO LUMBAR THREE - FOUR;  Surgeon: [name], MD;  Location: [facility] OR;  Service: Orthopedics;  Laterality: N/A;  XLIF L2-3, L3-4   CARPAL TUNNEL RELEASE Bilateral 1990's   COLONOSCOPY     KNEE ARTHROSCOPY W/ MENISCECTOMY Left 07/2014   LEFT HEART CATHETERIZATION WITH CORONARY ANGIOGRAM N/A 02/06/2013   Procedure: LEFT HEART CATHETERIZATION WITH CORONARY ANGIOGRAM;  Surgeon: Deatrice DELENA Cage, MD;  Location: [facility] CATH [facility];  Service: Cardiovascular;  Laterality: N/A;  non-obstruction 25% mLAD,  normal LVF , ef 65-70%   NASAL SINUS SURGERY     OVARIAN CYST REMOVAL Left    ROTATOR CUFF REPAIR Right 10/2016   TOTAL KNEE ARTHROPLASTY Left 03/26/2015   Procedure: LEFT TOTAL KNEE ARTHROPLASTY;  Surgeon: Donnice Car, MD;  Location:  [facility] ORS;  Service: Orthopedics;  Laterality: Left;   Patient Active Problem List   Diagnosis Date Noted   Pulmonary embolism (HCC) 04/27/2024   Acute pulmonary embolism (HCC) 04/25/2024   Acute deep vein thrombosis (DVT) of left lower extremity (HCC) 04/25/2024   S/P lumbar fusion 05/01/2020   Hypertension 11/20/2019   Family history of early CAD 07/15/2017   Elevated blood pressure reading in office without diagnosis of hypertension 07/15/2017   Obese 03/27/2015   S/P left TKA 03/26/2015   S/P knee replacement 03/26/2015   Colitis, acute 12/01/2014   Weight loss, unintentional 12/01/2014   Adnexal cyst 12/01/2014   Elevated fasting glucose 12/01/2014   CAD (coronary artery disease) 12/01/2014   Osteoarthritis 12/01/2014   Vasovagal attack 02/28/2013   Hyperlipidemia 02/28/2013    PCP: Prentice Blush MD  REFERRING  PROVIDER: Donaciano Sprang MD  REFERRING DIAG: M54.50 (ICD-10-CM) - Low back pain, unspecified   Rationale for Evaluation and Treatment: Rehabilitation  THERAPY DIAG:  Muscle weakness (generalized)  Other low back pain  ONSET DATE: June 2025  SUBJECTIVE:                                                                                                                                                                                           SUBJECTIVE STATEMENT: Pt reports that her back hurts a little more when she does cleaning around house, but not bad.   Swelling in LLE continues to varies day to day.  She is riding elliptical 15 min, 2x/day. Rt ankle pain comes and goes.   Pt is scheduled for vein procedure on 12/04/24. If dr is able to get stint in, she will stay overnight in hospital.     Initial Subjective 3 weeks post surgery bl clots lung and lle.  On blood thinner better. Trying to ride elliptical up towards 40 mins a day at home in 2 sessions.  I do have some residual circulation problems through LLE primarily ankle and anterior calf area.  Prior to surgery I had right sided nerve pain into right knee and some calf now is is better. Only have pain there now after lying down at night after about 10 minutes.  I get up more than 5-6 times nightly due to pain.  No pain during day when standing. I have tendonitis both ankle chronic, limits some of my walking more than my back. Not as steady  PERTINENT HISTORY:  Surgery 04/03/24: 09/05/24 Dr sprang Pt is now 5 months out from a XLIF L2-4 with supplemental pedicle screw fixation L2-S1. Patient has had a prior L4-S1 instrumented fusion and we extended this to  L2 .    04/25/2024 she was admitted to the hospital and diagnosed with a PE and lower extremity DVT   L TKR R Knee OA   PAIN:  Are you having pain? Yes: NPRS scale: 3/10 LLE, 2/10 lower back  Pain location: see above   Pain description: sore Aggravating factors: supine/lying  down Relieving factors: lying down short while, elevating LLE     PRECAUTIONS: Fall   RED FLAGS: None      WEIGHT BEARING RESTRICTIONS: No   FALLS:  Has patient fallen in last 6 months? No   LIVING ENVIRONMENT: Lives with: lives with their spouse Lives in: House/apartment Stairs: 3-4 step going into home no handrail Has following equipment at home: None   OCCUPATION: retired.   PLOF: Independent  PATIENT GOALS: sleeping better, energy, walking further, improve balance  NEXT MD VISIT: 6 months  OBJECTIVE:  Note: Objective measures were completed at Evaluation unless otherwise noted.  DIAGNOSTIC FINDINGS:  NA  PATIENT SURVEYS:  ODI:   11/07/24: 14/50=28% COGNITION: Overall cognitive status: Within functional limits for tasks assessed     SENSATION: WFL  MUSCLE LENGTH: Hamstrings: wfl   POSTURE: rounded shoulders, forward head, and decreased lumbar lordosis  PALPATION: No TTP  LUMBAR ROM:   AROM eval  Flexion NT  Extension wfl  Right lateral flexion wfl  Left lateral flexion Wfl Mild discomfort  Right rotation   Left rotation    (Blank rows = not tested)  LOWER EXTREMITY ROM:     wfl   LOWER EXTREMITY MMT:    MMT Right eval Left eval R / L 11/07/24  Hip flexion 24.0 26.6 35.1 / 43.1  Hip extension     Hip abduction 23.7 25.6 28.8 / 33.2  Hip adduction     Hip internal rotation     Hip external rotation     Knee flexion 4 4   Knee extension     Ankle dorsiflexion 4 4   Ankle plantarflexion     Ankle inversion     Ankle eversion      (Blank rows = not tested)  LUMBAR SPECIAL TESTS:  Straight leg raise test: Negative  FUNCTIONAL TESTS:  5 times sit to stand: 17.54 Timed up and go (TUG): 13.57. Pt needs extra time to rise then lower from chair 4 stage balance: passed 1&2.  Tandem unsteady but able to hold 15s.  SLS x 3 s   11/07/24: 5 x STS: 13.86      TUG: 11.39      Tandem 20s; SLS 12s     GAIT: Distance walked: 500  ft Assistive device utilized: None Level of assistance: Complete Independence Comments: wfl  TREATMENT   [facility] Adult PT Treatment:                                                DATE: 12/01/24 Pt seen for aquatic therapy today.  Treatment took place in water 3.5-4.75 ft in depth at the [facility] pool. Temp of water was 91.  Pt entered/exited the pool via stairs using alternating pattern with hand rail.  *unsupported walking forward, backward and side stepping  * solid noodle stomp x 12 each LE, hip in neutral then ext rotation * standing balance on solid black noodle-> mini squats-> slow marching * forward walking lunges with UE on rainbow  hand floats * side stepping with arm add/abdct with rainbow hand floats *solid black noodle pull down for TrA engagement in staggered stances x 10 *Plank with hands on 4th step (from bottom) in water with alternating hip extension (controlled height of LE) *Seated on 3rd step:  hip add/abd  *STS from 3rd step x 7 *cycling with noodle under arms  Pt requires the buoyancy and hydrostatic pressure of water for support, and to offload joints by unweighting joint load by at least 50 % in navel deep water and by at least 75-80% in chest to neck deep water.  Viscosity of the water is needed for resistance of strengthening. Water current perturbations provides challenge to standing balance requiring increased core activation.   [facility] Adult PT Treatment:                                                DATE: 10/25/24  Therapeutic Exercise: Elliptical L1 x 5 min  Review of current exercises she completes (SLR - encouraged to bend opposite knee; single knee to chest- encouraged to bend opposite knee; hip abdct in sidelying) Quadruped opposite arm/ leg lifts x 5 each side (some pain in Lt knee with kneeling) Standing bil shoulder ext with red band in staggered stance with TrA set x 10 each LE forward Standing bil low row with green band in staggered stance  with TrA set x 10 each LE forward Bow and arrow with step back with TrA set x 10 each LE forward, green band Hip abdct in sidelying with small pulses x 12 each LE, core engaged Bridge with 5 sec hold x 10 Hook lying piriformis stretch 20 sec                                                                                                                                   PATIENT EDUCATION:  Education details: reacquainting to aquatic therapy Person educated: Patient Education method: Explanation Education comprehension: verbalized understanding  HOME EXERCISE PROGRAM: Issued last episode: AQUATIC Access Code: [email or web address] URL: [email or web address] Date: 02/14/2024  Land Access Code: [email or web address] URL: [email or web address] Date: 10/25/2024  - issued with red and green band   ASSESSMENT:  CLINICAL IMPRESSION: Some waxing and waning of LLE edema.  Good toleration to exercises for LE and core strengthening. She reports some reduction in LLE and lower back pain during session. Pt is making gradual progress towards remaining goals.      Pt has made good progress with therapy reporting good response from aquatic sessions with very little to no LBP.  Shre complete stair climbing using alternating pattern without difficulty.  Does use slight rue on hand rail for safety meeting goal. 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 The  hydrostatic pressure assists the venous return while submerged improving symptoms experienced in LLE throughout session. Plan going forward will be determined by MD appt tomorrow.  Discussed progressing to land based intervention for a few visits, progressing out of aquatics. Re-cert to be completed 1/13. Goals ongoing    OBJECTIVE IMPAIRMENTS: decreased activity tolerance, decreased balance, decreased endurance, decreased mobility, difficulty walking,  decreased ROM, decreased strength, impaired flexibility, postural dysfunction, and pain.   ACTIVITY LIMITATIONS: carrying, lifting, bending, squatting, sleeping, stairs, transfers, and locomotion level  PARTICIPATION LIMITATIONS: shopping, community activity, and yard work  [facility] POTENTIAL: Good  CLINICAL DECISION MAKING: Evolving/moderate complexity  EVALUATION COMPLEXITY: Moderate   GOALS: Goals reviewed with patient? Yes  SHORT TERM GOALS: Target date: 10/04/24  Pt will tolerate full aquatic sessions consistently without increase in pain and with improving function to demonstrate good toleration and effectiveness of intervention.  Baseline: Goal status: MET- 10/23/24  2.  Pt will tolerate stair climbing using alternating pattern ascending and descending 6 steps without use of handrail Baseline:  Goal status:  Met1/6/26  3.  Pt will report improved toleration to resting/sleeping in recliner vs bed for improved resting as nerves heal Baseline:  Goal status: MET- 10/23/24   LONG TERM GOALS: Target date: 01/05/25  Pt to improve on ODI by 13% to demonstrate statistically significant Improvement in function. (MCID 13-15%) Baseline: 19/50=38%; 11/07/24: 14/50=28% Goal status: In Progress 11/07/24  2.  Pt will improve amb toleration to > 1 hour without limitation of ankle pain by 50% Baseline:  Goal status: In progress 11/07/24  3. Pt will improve strength in all areas listed by  5lb  to demonstrate improved overall physical function Baseline: see chart Goal status: Met 11/07/24  4.  Pt will report reduced frequency of waking at night to <3x Baseline: >6 Goal status: In progress 11/07/24  5.  Pt will improve on 5 X STS test to <or= 12s to demonstrate improving functional lower extremity strength, transitional movements, and balance. (MDC = 4.2sec)  Baseline: 17.54; 13.86 Goal status: In progress 11/07/24  PLAN:  PT FREQUENCY: 1-2x/week  PT DURATION: 8 weeks  PLANNED  INTERVENTIONS: 97164- PT Re-evaluation, 97750- Physical Performance Testing, 97110-Therapeutic exercises, 97530- Therapeutic activity, 97112- Neuromuscular re-education, 97535- Self Care, 02859- Manual therapy, [phone]- Gait training, [phone]- Aquatic Therapy, [phone] (1-2 muscles), 20561 (3+ muscles)- Dry Needling, Patient/Family education, Balance training, Stair training, Taping, Joint mobilization, DME instructions, Cryotherapy, and Moist heat.  PLAN FOR NEXT SESSION: aquatics for lumbosacral strengthening,, pain management, stretching,   Delon Aquas, PTA 12/01/24 11:00 AM [facility] [address] [place], [place], [address] Phone: [phone]   Fax:  [phone]    Referring diagnosis? Low back pain Treatment diagnosis? (if different than referring diagnosis) added ankle pain What was this (referring dx) caused by? [x]  Surgery []  Fall [x]  Ongoing issue []  Arthritis []  Other: ____________  Laterality: []  Rt []  Lt [x]  Both  Check all possible CPT codes:  *CHOOSE 10 OR LESS*    See Planned Interventions listed in the Plan section of the Evaluation.    "

## 2024-12-04 ENCOUNTER — Ambulatory Visit (HOSPITAL_COMMUNITY): Admission: RE | Admit: 2024-12-04 | Source: Home / Self Care | Admitting: Vascular Surgery

## 2024-12-04 ENCOUNTER — Encounter (HOSPITAL_COMMUNITY): Admission: RE | Payer: Self-pay | Source: Home / Self Care

## 2024-12-06 ENCOUNTER — Ambulatory Visit (HOSPITAL_BASED_OUTPATIENT_CLINIC_OR_DEPARTMENT_OTHER): Admitting: Physical Therapy

## 2024-12-08 ENCOUNTER — Ambulatory Visit (HOSPITAL_BASED_OUTPATIENT_CLINIC_OR_DEPARTMENT_OTHER): Admitting: Physical Therapy

## 2024-12-12 ENCOUNTER — Ambulatory Visit (HOSPITAL_BASED_OUTPATIENT_CLINIC_OR_DEPARTMENT_OTHER): Admitting: Physical Therapy

## 2024-12-14 ENCOUNTER — Ambulatory Visit (HOSPITAL_BASED_OUTPATIENT_CLINIC_OR_DEPARTMENT_OTHER): Admitting: Physical Therapy

## 2024-12-19 ENCOUNTER — Ambulatory Visit (HOSPITAL_BASED_OUTPATIENT_CLINIC_OR_DEPARTMENT_OTHER): Admitting: Physical Therapy

## 2024-12-21 ENCOUNTER — Ambulatory Visit (HOSPITAL_BASED_OUTPATIENT_CLINIC_OR_DEPARTMENT_OTHER): Admitting: Physical Therapy
